# Patient Record
Sex: Male | Born: 1960 | Race: White | Hispanic: No | Marital: Married | Smoking: Never smoker
Health system: Southern US, Community
[De-identification: ages and names within clinical notes are randomized; demographics above are authoritative.]

## PROBLEM LIST (undated history)

## (undated) DIAGNOSIS — H269 Unspecified cataract: Secondary | ICD-10-CM

## (undated) DIAGNOSIS — I10 Essential (primary) hypertension: Secondary | ICD-10-CM

## (undated) DIAGNOSIS — T7840XA Allergy, unspecified, initial encounter: Secondary | ICD-10-CM

## (undated) DIAGNOSIS — E785 Hyperlipidemia, unspecified: Secondary | ICD-10-CM

## (undated) DIAGNOSIS — C801 Malignant (primary) neoplasm, unspecified: Secondary | ICD-10-CM

## (undated) DIAGNOSIS — N189 Chronic kidney disease, unspecified: Secondary | ICD-10-CM

## (undated) DIAGNOSIS — M1990 Unspecified osteoarthritis, unspecified site: Secondary | ICD-10-CM

## (undated) HISTORY — DX: Hyperlipidemia, unspecified: E78.5

## (undated) HISTORY — DX: Essential (primary) hypertension: I10

## (undated) HISTORY — DX: Unspecified cataract: H26.9

## (undated) HISTORY — PX: BRAIN SURGERY: SHX531

## (undated) HISTORY — DX: Malignant (primary) neoplasm, unspecified: C80.1

## (undated) HISTORY — DX: Chronic kidney disease, unspecified: N18.9

## (undated) HISTORY — DX: Allergy, unspecified, initial encounter: T78.40XA

## (undated) HISTORY — DX: Unspecified osteoarthritis, unspecified site: M19.90

---
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
## 2008-11-03 ENCOUNTER — Ambulatory Visit: Payer: Self-pay | Admitting: Internal Medicine

## 2010-12-07 LAB — HM COLONOSCOPY: HM Colonoscopy: NORMAL

## 2011-09-14 HISTORY — PX: MOHS SURGERY: SUR867

## 2011-12-07 ENCOUNTER — Encounter: Payer: Self-pay | Admitting: Internal Medicine

## 2011-12-07 ENCOUNTER — Ambulatory Visit (INDEPENDENT_AMBULATORY_CARE_PROVIDER_SITE_OTHER): Payer: BC Managed Care – PPO | Admitting: Internal Medicine

## 2011-12-07 VITALS — BP 115/71 | HR 64 | Temp 98.4°F | Resp 16 | Ht 69.75 in | Wt 180.0 lb

## 2011-12-07 DIAGNOSIS — E559 Vitamin D deficiency, unspecified: Secondary | ICD-10-CM

## 2011-12-07 DIAGNOSIS — I10 Essential (primary) hypertension: Secondary | ICD-10-CM

## 2011-12-07 DIAGNOSIS — E785 Hyperlipidemia, unspecified: Secondary | ICD-10-CM

## 2011-12-07 DIAGNOSIS — K4090 Unilateral inguinal hernia, without obstruction or gangrene, not specified as recurrent: Secondary | ICD-10-CM

## 2011-12-07 DIAGNOSIS — Z125 Encounter for screening for malignant neoplasm of prostate: Secondary | ICD-10-CM

## 2011-12-07 DIAGNOSIS — Z8679 Personal history of other diseases of the circulatory system: Secondary | ICD-10-CM

## 2011-12-07 LAB — COMPREHENSIVE METABOLIC PANEL
ALT: 17 U/L (ref 0–53)
Albumin: 4.5 g/dL (ref 3.5–5.2)
CO2: 27 mEq/L (ref 19–32)
Calcium: 9.7 mg/dL (ref 8.4–10.5)
Chloride: 105 mEq/L (ref 96–112)
Creatinine, Ser: 1.1 mg/dL (ref 0.4–1.5)
GFR: 78.62 mL/min (ref >60.00)
Potassium: 4.9 mEq/L (ref 3.5–5.1)
Total Protein: 7.3 g/dL (ref 6.0–8.3)

## 2011-12-07 LAB — TSH: TSH: 1.44 u[IU]/mL (ref 0.35–5.50)

## 2011-12-07 LAB — LIPID PANEL: HDL: 48.3 mg/dL (ref >39.00)
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
## 2011-12-08 LAB — VITAMIN D 25 HYDROXY (VIT D DEFICIENCY, FRACTURES): Vit D, 25-Hydroxy: 75 ng/mL (ref 30–89)

## 2011-12-10 ENCOUNTER — Encounter: Payer: Self-pay | Admitting: Internal Medicine

## 2011-12-10 DIAGNOSIS — E785 Hyperlipidemia, unspecified: Secondary | ICD-10-CM | POA: Insufficient documentation

## 2011-12-10 DIAGNOSIS — K4090 Unilateral inguinal hernia, without obstruction or gangrene, not specified as recurrent: Secondary | ICD-10-CM | POA: Insufficient documentation

## 2011-12-10 NOTE — Assessment & Plan Note (Signed)
Right side, with mild bulge, no signs of incarceration.  He is not endorsing pain so there is no urgency  To repair at this time.

## 2011-12-10 NOTE — Assessment & Plan Note (Signed)
No evidence currently.  previously addressed with weight loss

## 2011-12-10 NOTE — Assessment & Plan Note (Signed)
Return for fasting lipids.  Low glycemic index discussed.

## 2011-12-11 ENCOUNTER — Telehealth: Payer: Self-pay | Admitting: Internal Medicine

## 2011-12-11 NOTE — Telephone Encounter (Signed)
I do not know how to do that since he did not sign up for Mychart before I reviewed them.  Have you any idea?

## 2011-12-11 NOTE — Telephone Encounter (Signed)
Wanting labs posted to mychart. °

## 2011-12-12 NOTE — Telephone Encounter (Signed)
Left message notifying patient that we will mail him a copy of the labs.

## 2011-12-26 ENCOUNTER — Ambulatory Visit: Payer: Self-pay | Admitting: Internal Medicine

## 2012-08-30 ENCOUNTER — Other Ambulatory Visit: Payer: Self-pay

## 2012-10-06 ENCOUNTER — Encounter: Payer: Self-pay | Admitting: Adult Health

## 2012-10-06 ENCOUNTER — Ambulatory Visit (INDEPENDENT_AMBULATORY_CARE_PROVIDER_SITE_OTHER): Payer: BC Managed Care – PPO | Admitting: Adult Health

## 2012-10-06 VITALS — BP 156/84 | HR 74 | Temp 98.1°F | Resp 16 | Wt 176.0 lb

## 2012-10-06 DIAGNOSIS — J329 Chronic sinusitis, unspecified: Secondary | ICD-10-CM

## 2012-10-06 MED ORDER — AMOXICILLIN-POT CLAVULANATE 875-125 MG PO TABS: 1.0000 | ORAL_TABLET | Freq: Two times a day (BID) | ORAL | Status: DC

## 2012-10-06 NOTE — Patient Instructions (Addendum)
Please start the Augmentin today. You will take 1 tablet 2 times a day for 10 days.  If you see no improvement in your symptoms by day 4-5 please call the office.  Below is additional information on sinusitis:  What is sinusitis? - Sinusitis is a condition that can cause a stuffy nose, pain in the face, and yellow or green discharge (mucus) from the nose. The sinuses are hollow areas in the bones of the face. They have a thin lining that normally makes a small amount of mucus. When this lining gets infected, it swells and makes extra mucus. This causes symptoms.   Sinusitis can occur when a person gets sick with a cold. The germs causing the cold can also infect the sinuses. Many times, a person feels like his or her cold is getting better. But then he or she gets sinusitis and begins to feel sick again.  What are the symptoms of sinusitis? - Common symptoms of sinusitis include: Stuffy or blocked nose  Thick yellow or green discharge from the nose  Pain in the teeth  Pain or pressure in the face - This often feels worse when a person bends forward.   People with sinusitis can also have other symptoms that include: Fever  Cough  Trouble smelling  Ear pressure or fullness  Headache  Bad breath  Feeling tired   Most of the time, symptoms start to improve in 7 to 10 days.  Should I see a doctor or nurse? - See your doctor or nurse if your symptoms last more than 7 days, or if your symptoms get better at first but then get worse. Sometimes, sinusitis can lead to serious problems. See your doctor or nurse right away (do not wait 7 days) if you have: Fever higher than 102.29F (39.2C)  Sudden and severe pain in the face and head  Trouble seeing or seeing double  Trouble thinking clearly  Swelling or redness around 1 or both eyes  Trouble breathing or a stiff neck   Is there anything I can do on my own to feel better? - Yes. To reduce your symptoms, you can: Take an  over-the-counter pain reliever to reduce the pain  Rinse your nose and sinuses with salt water a few times a day - Ask your doctor or nurse about the best way to do this.  Use a decongestant nose spray - These sprays are sold in a pharmacy. But do not use decongestant nose sprays for more than 2 to 3 days in a row. Using them more than 3 days in a row can make symptoms worse.   You should NOT take an antihistamine for sinusitis. Common antihistamines include diphenhydramine (sample brand name: Benadryl), chlorpheniramine (sample brand name: Chlor-Trimeton), loratadine (sample brand name: Claritin), and cetirizine (sample brand name: Zyrtec). They can treat allergies, but not sinus infections, and could increase your discomfort by drying the lining of your nose and sinuses, or making you tired.   Your doctor might also prescribe a steroid nose spray to reduce the swelling in your nose. (Steroid nose sprays do not contain the same steroids that athletes take to build muscle.)  How is sinusitis treated? - Most of the time, sinusitis does not need to be treated with antibiotic medicines. This is because most sinusitis is caused by viruses - not bacteria - and antibiotics do not kill viruses. Many people get over sinus infections without antibiotics.  Some people with sinusitis do need treatment with antibiotics.  If your symptoms have not improved after 7 to 10 days, ask your doctor if you should take antibiotics. Your doctor might recommend that you wait 1 more week to see if your symptoms improve. But if you have symptoms such as a fever or a lot of pain, he or she might prescribe antibiotics. It is important to follow your doctor's instructions about taking your antibiotics.

## 2012-10-06 NOTE — Progress Notes (Signed)
  Subjective:    Patient ID: Daniel Lawrence, male    DOB: August 22, 1960, 52 y.o.   MRN: [id number]  HPI  Patient is a pleasant 52 year old male who presents to clinic with complaints of sinus pressure, drainage that is yellow in color, cough and congestion. Symptoms have been ongoing for greater than 7 days. He has tried Zicam and he is also taking NyQuil. Patient also tried Claritin but he is not currently taking this. These helped to some degree with some of his symptoms. He is concerned that he has not seen improvement.  Current Outpatient Prescriptions on File Prior to Visit  Medication Sig Dispense Refill  . Cholecalciferol (VITAMIN D-3) 5000 UNITS TABS Take 5,000 Units by mouth daily.       No current facility-administered medications on file prior to visit.    Review of Systems  Constitutional: Negative for fever and chills.  HENT: Positive for congestion, sore throat, rhinorrhea and postnasal drip.   Eyes: Negative.   Respiratory: Positive for cough and shortness of breath. Negative for chest tightness and wheezing.   Cardiovascular: Negative.   Gastrointestinal: Negative for nausea.  Neurological: Negative for light-headedness and headaches.    BP 156/84  Pulse 74  Temp(Src) 98.1 F (36.7 C) (Oral)  Resp 16  Wt 176 lb (79.833 kg)  BMI 25.42 kg/m2  SpO2 98%    Objective:   Physical Exam  Constitutional: He is oriented to person, place, and time. He appears well-developed and well-nourished. No distress.  HENT:  Head: Normocephalic and atraumatic.  Right Ear: External ear normal.  Left Ear: External ear normal.  Pharyngeal erythema  Neck: Normal range of motion. Neck supple.  Cardiovascular: Normal rate, regular rhythm and normal heart sounds.   No murmur heard. Pulmonary/Chest: Effort normal and breath sounds normal. No respiratory distress. He has no wheezes. He has no rales.  Musculoskeletal: Normal range of motion.  Lymphadenopathy:    He has no cervical  adenopathy.  Neurological: He is alert and oriented to person, place, and time.  Skin: Skin is warm and dry.  Psychiatric: He has a normal mood and affect. His behavior is normal. Judgment and thought content normal.       Assessment & Plan:

## 2012-10-06 NOTE — Assessment & Plan Note (Signed)
Symptoms ongoing for greater than 7 days without improvement. Now has changes in the color of secretions. Start Augmentin twice a day x10 days. He may use Afrin nasal spray for severe congestion x3 days. Recommend saline nasal spray to irrigate sinuses. Return to clinic if no improvement in symptoms within 4-5 days.

## 2013-05-21 ENCOUNTER — Other Ambulatory Visit: Payer: Self-pay

## 2013-06-26 ENCOUNTER — Encounter: Payer: Self-pay | Admitting: Internal Medicine

## 2013-06-26 ENCOUNTER — Ambulatory Visit (INDEPENDENT_AMBULATORY_CARE_PROVIDER_SITE_OTHER): Payer: BC Managed Care – PPO | Admitting: Internal Medicine

## 2013-06-26 VITALS — BP 128/72 | HR 60 | Temp 98.2°F | Resp 12 | Ht 69.75 in | Wt 181.0 lb

## 2013-06-26 DIAGNOSIS — M25519 Pain in unspecified shoulder: Secondary | ICD-10-CM

## 2013-06-26 DIAGNOSIS — H01006 Unspecified blepharitis left eye, unspecified eyelid: Secondary | ICD-10-CM

## 2013-06-26 DIAGNOSIS — H01009 Unspecified blepharitis unspecified eye, unspecified eyelid: Secondary | ICD-10-CM

## 2013-06-26 DIAGNOSIS — M25511 Pain in right shoulder: Secondary | ICD-10-CM

## 2013-06-26 DIAGNOSIS — G8929 Other chronic pain: Secondary | ICD-10-CM

## 2013-06-26 MED ORDER — SULFAMETHOXAZOLE-TMP DS 800-160 MG PO TABS: 1.0000 | ORAL_TABLET | Freq: Two times a day (BID) | ORAL | Status: DC

## 2013-06-26 MED ORDER — BACITRACIN 500 UNIT/GM OP OINT: 1.0000 "application " | TOPICAL_OINTMENT | Freq: Four times a day (QID) | OPHTHALMIC | Status: DC

## 2013-06-26 NOTE — Patient Instructions (Signed)
Blepharitis Blepharitis is redness, soreness, and swelling (inflammation) of one or both eyelids. It may be caused by an allergic reaction or a bacterial infection. Blepharitis may also be associated with reddened, scaly skin (seborrhea) of the scalp and eyebrows. While you sleep, eye discharge may cause your eyelashes to stick together. Your eyelids may itch, burn, swell, and may lose their lashes. These will grow back. Your eyes may become sensitive. Blepharitis may recur and need repeated treatment. If this is the case, you may require further evaluation by an eye specialist (ophthalmologist). HOME CARE INSTRUCTIONS   Keep your hands clean.  Use a clean towel each time you dry your eyelids. Do not use this towel to clean other areas. Do not share a towel or makeup with anyone.  Wash your eyelids with warm water or warm water mixed with a small amount of baby shampoo. Do this twice a day or as often as needed.  Wash your face and eyebrows at least once a day.  Use warm compresses 2 times a day for 10 minutes at a time, or as directed by your caregiver.  Apply antibiotic ointment as directed by your caregiver.  Avoid rubbing your eyes.  Avoid wearing makeup until you get better.  Follow up with your caregiver as directed. SEEK IMMEDIATE MEDICAL CARE IF:   You have pain, redness, or swelling that gets worse or spreads to other parts of your face.  Your vision changes, or you have pain when looking at lights or moving objects.  You have a fever.  Your symptoms continue for longer than 2 to 4 days or become worse. MAKE SURE YOU:   Understand these instructions.  Will watch your condition.  Will get help right away if you are not doing well or get worse. Document Released: 06/29/2000 Document Revised: 09/24/2011 Document Reviewed: 08/09/2010 Exit[employer] Patient Information 2014 [employer].  
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
## 2013-06-26 NOTE — Progress Notes (Signed)
Pre visit review using our clinic review tool, if applicable. No additional management support is needed unless otherwise documented below in the visit note. 

## 2013-06-28 DIAGNOSIS — H01006 Unspecified blepharitis left eye, unspecified eyelid: Secondary | ICD-10-CM | POA: Insufficient documentation

## 2013-06-28 DIAGNOSIS — G8929 Other chronic pain: Secondary | ICD-10-CM | POA: Insufficient documentation

## 2013-06-28 NOTE — Assessment & Plan Note (Signed)
Brought on with exertion.  Likely a rotator cuff injury.  Referral to sports medicine.

## 2013-06-28 NOTE — Assessment & Plan Note (Addendum)
Empiric treatment of presumed staph infection with septra  And bacitracin

## 2013-07-03 ENCOUNTER — Ambulatory Visit (INDEPENDENT_AMBULATORY_CARE_PROVIDER_SITE_OTHER): Payer: BC Managed Care – PPO | Admitting: Family Medicine

## 2013-07-03 ENCOUNTER — Encounter: Payer: Self-pay | Admitting: Family Medicine

## 2013-07-03 ENCOUNTER — Telehealth: Payer: Self-pay | Admitting: Internal Medicine

## 2013-07-03 ENCOUNTER — Other Ambulatory Visit (INDEPENDENT_AMBULATORY_CARE_PROVIDER_SITE_OTHER): Payer: BC Managed Care – PPO

## 2013-07-03 VITALS — BP 134/88 | HR 86 | Wt 179.0 lb

## 2013-07-03 DIAGNOSIS — M7550 Bursitis of unspecified shoulder: Secondary | ICD-10-CM | POA: Insufficient documentation

## 2013-07-03 DIAGNOSIS — M25519 Pain in unspecified shoulder: Secondary | ICD-10-CM

## 2013-07-03 DIAGNOSIS — M75100 Unspecified rotator cuff tear or rupture of unspecified shoulder, not specified as traumatic: Secondary | ICD-10-CM

## 2013-07-03 DIAGNOSIS — M7551 Bursitis of right shoulder: Secondary | ICD-10-CM

## 2013-07-03 DIAGNOSIS — M25511 Pain in right shoulder: Secondary | ICD-10-CM

## 2013-07-03 NOTE — Progress Notes (Signed)
Pre-visit discussion using our clinic review tool. No additional management support is needed unless otherwise documented below in the visit note.  

## 2013-07-03 NOTE — Assessment & Plan Note (Signed)
Injection as described above. Patient did have a very small tear of the supraspinatus less than 5% of the tendon is involved with no retraction. Home exercise program was given Discussed anti-inflammatories, see meds per orders Patient will try these interventions and come back and see me again in 4 weeks. I am optimistic he'll do very well with conservative therapy.

## 2013-07-03 NOTE — Patient Instructions (Signed)
Great to meet you Try exercises most days of the week Ice 20 minutes 2 times a day for 48 hours then after activity.  Vitamin D 5000 IU daily Fish oil 3 grams daily can help Tumeric 500mg  twice daily is like ibuprofen.  If still in pain or not perfect come back in 4 weeks.

## 2013-07-03 NOTE — Progress Notes (Signed)
I'm seeing this patient by the request  of:  TULLO,TERESA, MD   CC: Shoulder pain right-sided  HPI: Patient is a 52 year old right-hand-dominant male coming in with right shoulder pain. Patient states that he does narrative or any true injury. Patient states seems to be worse with activity. 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 Patient denies any numbness. Patient states it hurts more when he does overhead. States more of a dull nagging pain. Denies any weakness. Patient rates the discomfort 4/10. Patient has not tried anything at home for it at this time. 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   Past medical, surgical, family and social history reviewed. Medications reviewed all in the electronic medical record.   Review of Systems: No headache, visual changes, nausea, vomiting, diarrhea, constipation, dizziness, abdominal pain, skin rash, fevers, chills, night sweats, weight loss, swollen lymph nodes, body aches, joint swelling, muscle aches, chest pain, shortness of breath, mood changes.   Objective:    Blood pressure 134/88, pulse 86, weight 179 lb (81.194 kg), SpO2 98.00%.   General: No apparent distress alert and oriented x3 mood and affect normal, dressed appropriately.  HEENT: Pupils equal, extraocular movements intact Respiratory: Patient's speak in full sentences and does not appear short of breath Cardiovascular: No lower extremity edema, non tender, no erythema Skin: Warm dry intact with no signs of infection or rash on extremities or on axial skeleton. Abdomen: Soft nontender Neuro: Cranial nerves II through XII are intact, neurovascularly intact in all extremities with 2+ DTRs and 2+ pulses. Lymph: No lymphadenopathy of posterior or anterior cervical chain or axillae bilaterally.  Gait normal with good balance and coordination.  MSK: Non tender with full range of motion and good stability  and symmetric strength and tone of elbows, wrist, hip, knee and ankles bilaterally. Patient's joints to have increased flexibility with beighton score of 6-9 Shoulder: Right Inspection reveals no abnormalities, atrophy or asymmetry. Palpation is normal with no tenderness over AC joint or bicipital groove. ROM is full in all planes. Rotator cuff strength normal throughout. Mild signs of impingement with positive Neer and Hawkin's tests, but negative empty can sign. Speeds and Yergason's tests normal. No labral pathology noted with negative Obrien's, negative clunk and good stability. Normal scapular function observed. No painful arc and no drop arm sign. No apprehension sign  MSK US performed of: Right shoulder This study was ordered, performed, and interpreted by Terrilee Files D.O.  Shoulder:   Supraspinatus:  A patient has a significant bursitis occurring at the insertion of the supraspinatus. There is a very small less than 5% tear of the supraspinatus intrasubstance. Infraspinatus:  Appears normal on long and transverse views. Significant hypoechoic changes in the area. Subscapularis:  Appears normal on long and transverse views. Teres Minor:  Appears normal on long and transverse views. AC joint:  Capsule distended, Glenohumeral Joint:  Appears normal with mild effusion. Glenoid Labrum:  Posterior labrum does have what appears to be a degenerative tear Biceps Tendon:  Appears normal on long and transverse views, no fraying of tendon, tendon located in intertubercular groove, no subluxation with shoulder internal or external rotation. Mild increase in hypoechoic changes. No increased power doppler signal.  Impression: Subacromial bursitis with very small intersubstance tear of the supraspinatus. Also posterior labral tear degenerative in nature.  Procedure: Real-time Ultrasound Guided Injection of right glenohumeral joint Device: GE Logiq E  Ultrasound guided injection is preferred  based studies that show increased duration, increased effect, greater accuracy, decreased procedural pain, increased response rate with ultrasound guided versus blind injection.  Verbal informed consent obtained.  Time-out conducted.  Noted no overlying erythema, induration, or other signs of local infection.  Skin prepped in a sterile fashion.  Local anesthesia: Topical Ethyl chloride.  With sterile technique and under real time ultrasound guidance:  Joint visualized.  23g 1  inch needle inserted posterior approach. Pictures taken for needle placement. Patient did have injection of 2 cc of 1% lidocaine, 2 cc of 0.5% Marcaine, and 1.0 cc of Kenalog 40 mg/dL. Completed without difficulty  Pain immediately resolved suggesting accurate placement of the medication.  Advised to call if fevers/chills, erythema, induration, drainage, or persistent bleeding.  Images permanently stored and available for review in the ultrasound unit.  Impression: Technically successful ultrasound guided injection.     Impression and Recommendations:     This case required medical decision making of moderate complexity.

## 2013-07-03 NOTE — Telephone Encounter (Signed)
Patient Information:  Caller Name: Jordin  Phone: [phone]  Patient: Daniel Lawrence, Daniel Lawrence  Gender: Male  DOB: 03-02-1961  Age: 52 Years  PCP: Duncan Dull (Adults only)  Office Follow Up:  Does the office need to follow up with this patient?: No  Instructions For The Office: N/A  RN Note:  Kadence developed a rash on his chest and legs this afternoon.  States it is slightly raised, pinik bumps.  Mild itching. Took last dose of sulfa abx last night.  Had Corticosteroid shot in right shoulder this morning.  Office is closed.  Advised UC.  Symptoms  Reason For Call & Symptoms: Rash  Reviewed Health History In EMR: Yes  Reviewed Medications In EMR: Yes  Reviewed Allergies In EMR: Yes  Reviewed Surgeries / Procedures: Yes  Date of Onset of Symptoms: 07/03/2013  Guideline(s) Used:  Rash - Widespread on Drugs - Drug Reaction  Disposition Per Guideline:   See Today in Office  Reason For Disposition Reached:   All other patients with widespread rash taking a new medication  Advice Given:  Call Back If:  You become worse.  Patient Will Follow Care Advice:  YES

## 2013-07-04 ENCOUNTER — Ambulatory Visit: Payer: Self-pay | Admitting: Physician Assistant

## 2013-07-06 NOTE — Telephone Encounter (Signed)
Pt advised to go to [facility], after office hours

## 2013-07-12 ENCOUNTER — Encounter: Payer: Self-pay | Admitting: Internal Medicine

## 2014-05-15 LAB — BASIC METABOLIC PANEL
BUN: 10 mg/dL (ref 4–21)
Creatinine: 1.1 mg/dL (ref 0.6–1.3)
GLUCOSE: 83 mg/dL
Potassium: 5 mmol/L (ref 3.4–5.3)
Sodium: 140 mmol/L (ref 137–147)

## 2014-05-15 LAB — CBC AND DIFFERENTIAL
HCT: 49 % (ref 41–53)
Hemoglobin: 16.8 g/dL (ref 13.5–17.5)
PLATELETS: 215 10*3/uL (ref 150–399)
WBC: 4.2 10*3/mL

## 2014-05-15 LAB — HEPATIC FUNCTION PANEL
ALK PHOS: 66 U/L (ref 25–125)
ALT: 23 U/L (ref 10–40)
AST: 23 U/L (ref 14–40)
BILIRUBIN DIRECT: 0.18 mg/dL (ref 0.01–0.4)
Bilirubin, Total: 0.8 mg/dL

## 2014-05-15 LAB — LIPID PANEL
Cholesterol: 217 mg/dL — AB (ref 0–200)
HDL: 54 mg/dL (ref 35–70)
LDL Cholesterol: 132 mg/dL
Triglycerides: 155 mg/dL (ref 40–160)

## 2014-06-03 ENCOUNTER — Encounter: Payer: Self-pay | Admitting: Internal Medicine

## 2014-06-23 ENCOUNTER — Encounter: Payer: Self-pay | Admitting: Internal Medicine

## 2014-06-23 ENCOUNTER — Ambulatory Visit (INDEPENDENT_AMBULATORY_CARE_PROVIDER_SITE_OTHER): Payer: BC Managed Care – PPO | Admitting: Internal Medicine

## 2014-06-23 VITALS — BP 158/88 | HR 65 | Temp 97.8°F | Resp 16 | Ht 69.75 in | Wt 189.0 lb

## 2014-06-23 DIAGNOSIS — R238 Other skin changes: Secondary | ICD-10-CM

## 2014-06-23 DIAGNOSIS — R233 Spontaneous ecchymoses: Secondary | ICD-10-CM

## 2014-06-23 DIAGNOSIS — Z85828 Personal history of other malignant neoplasm of skin: Secondary | ICD-10-CM

## 2014-06-23 DIAGNOSIS — Z0000 Encounter for general adult medical examination without abnormal findings: Secondary | ICD-10-CM

## 2014-06-23 DIAGNOSIS — Z8679 Personal history of other diseases of the circulatory system: Secondary | ICD-10-CM

## 2014-06-23 DIAGNOSIS — Z9889 Other specified postprocedural states: Secondary | ICD-10-CM | POA: Insufficient documentation

## 2014-06-23 DIAGNOSIS — R030 Elevated blood-pressure reading, without diagnosis of hypertension: Secondary | ICD-10-CM

## 2014-06-23 DIAGNOSIS — I10 Essential (primary) hypertension: Secondary | ICD-10-CM

## 2014-06-23 DIAGNOSIS — Z23 Encounter for immunization: Secondary | ICD-10-CM

## 2014-06-23 DIAGNOSIS — E785 Hyperlipidemia, unspecified: Secondary | ICD-10-CM

## 2014-06-23 LAB — MICROALBUMIN / CREATININE URINE RATIO
CREATININE, U: 24 mg/dL
MICROALB UR: 15.4 mg/dL — AB (ref 0.0–1.9)
Microalb Creat Ratio: 64.1 mg/g — ABNORMAL HIGH (ref 0.0–30.0)

## 2014-06-23 LAB — PROTIME-INR
INR: 1 ratio (ref 0.8–1.0)
Prothrombin Time: 11.3 s (ref 9.6–13.1)

## 2014-06-23 LAB — APTT: aPTT: 29.1 s (ref 23.4–32.7)

## 2014-06-23 NOTE — Assessment & Plan Note (Signed)
Home readings advised.  Checking BMEt and urine today

## 2014-06-23 NOTE — Progress Notes (Signed)
Patient ID: Daniel Lawrence, male   DOB: 1961/04/20, 53 y.o.   MRN: [id number]   The patient is here for his annual male physical examination and management of other chronic and acute problems.  Mulitple concerns:   Recurrent bruising occurring  Occurs after yoga classes applying pressure on arm .  Lasts about two weeks. Painless.  Takes no aspirin  products.  Gums do not bleed   No nosebleeds.  Hyperlipidemia.  Concerned about LDL of 132  Wants to see a nutritionist  Shoulder pain resolved after steroid injection  Worried about a lump in the back of his neck found by massage therapist .  No history of scalp infections but lots of seasonal allergies and sinus pressure .  Using claritin   Blepharitis recurrent.  Went to [facility] and saw Philis Kendall who was supposed to refer him to another ophthalmologist but did not.  Was prescribed a steroid ointment which caused blurred vision . Would like the referral to take place.  Sees [facility] annually,  No cancerous lesions this year. Dentist semiannually       The risk factors are reflected in the social history.  The roster of all physicians providing medical care to patient - is listed in the Snapshot section of the chart.  Activities of daily living:  The patient is 100% independent in all ADLs: dressing, toileting, feeding as well as independent mobility  Home safety : The patient has smoke detectors in the home. He wears seatbelts.  There are no firearms at home. There is no violence in the home.   There is no risks for hepatitis, STDs or HIV. There is no   history of blood transfusion. There is no travel history to infectious disease endemic areas of the world.  The patient has seen their dentist in the last six month and  their eye doctor in the last year.  They do not  have excessive sun exposure. They have seen a dermatoloigist in the last year. Discussed the need for sun protection: hats, long sleeves and use of  sunscreen if there is significant sun exposure.   Diet: the importance of a healthy diet is discussed. They do have a healthy diet.  The benefits of regular aerobic exercise were discussed. He exercises a minimum of 30 minutes  5 days per week.  Depression screen: there are no signs or vegative symptoms of depression- irritability, change in appetite, anhedonia, sadness/tearfullness.  The following portions of the patient's history were reviewed and updated as appropriate: allergies, current medications, past family history, past medical history,  past surgical history, past social history  and problem list.  Visual acuity was not assessed per patient preference since he has regular follow up with his ophthalmologist. Hearing and body mass index were assessed and reviewed.   During the course of the visit the patient was educated and counseled about appropriate screening and preventive services including :  nutrition counseling, colorectal cancer screening, and recommended immunizations.      Objective:  BP 158/88 mmHg  Pulse 65  Temp(Src) 97.8 F (36.6 C) (Oral)  Resp 16  Ht 5' 9.75" (1.772 m)  Wt 189 lb (85.73 kg)  BMI 27.30 kg/m2  SpO2 98%  General Appearance:    Alert, cooperative, no distress, appears stated age  Head:    Normocephalic, without obvious abnormality, atraumatic  Eyes:    PERRL, conjunctiva/corneas clear, EOM's intact, fundi    benign, both eyes  Ears:    Normal TM's and external ear canals, both ears  Nose:   Nares normal, septum midline, mucosa normal, no drainage   or sinus tenderness  Throat:   Lips, mucosa, and tongue normal; teeth and gums normal  Neck:   Supple, symmetrical, trachea midline, no adenopathy;       thyroid:  No enlargement/tenderness/nodules; no carotid   bruit or JVD  Back:     Symmetric, no curvature, ROM normal, no CVA tenderness  Lungs:     Clear to auscultation bilaterally, respirations unlabored  Chest wall:    No tenderness  or deformity  Heart:    Regular rate and rhythm, S1 and S2 normal, no murmur, rub   or gallop  Abdomen:     Soft, non-tender, bowel sounds active all four quadrants,    no masses, no organomegaly  Genitalia:    Normal male without lesion, discharge or tenderness  Rectal:    Normal tone, normal prostate, no masses or tenderness;     Extremities:   Extremities normal, atraumatic, no cyanosis or edema  Pulses:   2+ and symmetric all extremities  Skin:   Skin color, texture, turgor normal, no rashes or lesions  Lymph nodes:   Cervical, supraclavicular, and axillary nodes normal  Neurologic:   CNII-XII intact. Normal strength, sensation and reflexes      throughout   Assessment and Plan:  Problem List Items Addressed This Visit      Cardiovascular and Mediastinum   Hypertension - Primary    With mild proteinuria.  Starting lisinopril.  Patient ad ised to return in one week for repeat BMET    Relevant Medications      lisinopril (PRINIVIL,ZESTRIL) tablet     Other   Hyperlipidemia with target LDL less than 100    Mild, patient requesting referral to nutritionist to manage without medications     Relevant Medications      lisinopril (PRINIVIL,ZESTRIL) tablet   Easy bruising    PT and PTT and platelets are normal.  Reassurance given.     Relevant Orders      INR/PT (Completed)      PTT (Completed)   Encounter for preventive health examination    Annual male exam was done including testicular exam and check for hernias.  Screening for STDS and depressive symptoms was negative. Vaccines were brought up to date.  Healthy habits were reviewed including use of seatbelts 100% of the time , moderate alcohol consumption, regular exercise, and the  mediterranean diet.     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  Checking BMEt and urine today      Other Visit Diagnoses    Hyperlipidemia LDL goal <100         Relevant Medications       lisinopril (PRINIVIL,ZESTRIL) tablet    Other Relevant Orders       Ambulatory referral to Nutrition and Diabetic Education    Need for prophylactic vaccination with combined diphtheria-tetanus-pertussis (DTP) vaccine        Relevant Orders       Tdap vaccine greater than or equal to 7yo IM (Completed)

## 2014-06-23 NOTE — Patient Instructions (Addendum)
Debrox for your right ear wax buildup  You do not need statin  Therapy for your mildly elevated cholesterol,  But I will refer you to a  Nutritionist at [facility] received the TDaP vaccine today  Please check BP at home daily for the next 2 weeks and e mail me your readings   Health Maintenance A healthy lifestyle and preventative care can promote health and wellness.  Maintain regular health, dental, and eye exams.  Eat a healthy diet. Foods like vegetables, fruits, whole grains, low-fat dairy products, and lean protein foods contain the nutrients you need and are low in calories. Decrease your intake of foods high in solid fats, added sugars, and salt. Get information about a proper diet from your health care provider, if necessary.  Regular physical exercise is one of the most important things you can do for your health. Most adults should get at least 150 minutes of moderate-intensity exercise (any activity that increases your heart rate and causes you to sweat) each week. In addition, most adults need muscle-strengthening exercises on 2 or more days a week.   Maintain a healthy weight. The body mass index (BMI) is a screening tool to identify possible weight problems. It provides an estimate of body fat based on height and weight. Your health care provider can find your BMI and can help you achieve or maintain a healthy weight. For males 20 years and older:  A BMI below 18.5 is considered underweight.  A BMI of 18.5 to 24.9 is normal.  A BMI of 25 to 29.9 is considered overweight.  A BMI of 30 and above is considered obese.  Maintain normal blood lipids and cholesterol by exercising and minimizing your intake of saturated fat. Eat a balanced diet with plenty of fruits and vegetables. Blood tests for lipids and cholesterol should begin at age 41 and be repeated every 5 years. If your lipid or cholesterol levels are high, you are over age 41, or you are at high risk for heart disease,  you may need your cholesterol levels checked more frequently.Ongoing high lipid and cholesterol levels should be treated with medicines if diet and exercise are not working.  If you smoke, find out from your health care provider how to quit. If you do not use tobacco, do not start.  Lung cancer screening is recommended for adults aged 88-80 years who are at high risk for developing lung cancer because of a history of smoking. A yearly low-dose CT scan of the lungs is recommended for people who have at least a 30-pack-year history of smoking and are current smokers or have quit within the past 15 years. A pack year of smoking is smoking an average of 1 pack of cigarettes a day for 1 year (for example, a 30-pack-year history of smoking could mean smoking 1 pack a day for 30 years or 2 packs a day for 15 years). Yearly screening should continue until the smoker has stopped smoking for at least 15 years. Yearly screening should be stopped for people who develop a health problem that would prevent them from having lung cancer treatment.  If you choose to drink alcohol, do not have more than 2 drinks per day. One drink is considered to be 12 oz (360 mL) of beer, 5 oz (150 mL) of wine, or 1.5 oz (45 mL) of liquor.  Avoid the use of street drugs. Do not share needles with anyone. Ask for help if you need support or instructions  about stopping the use of drugs.  High blood pressure causes heart disease and increases the risk of stroke. Blood pressure should be checked at least every 1-2 years. Ongoing high blood pressure should be treated with medicines if weight loss and exercise are not effective.  If you are 34-78 years old, ask your health care provider if you should take aspirin to prevent heart disease.  Diabetes screening involves taking a blood sample to check your fasting blood sugar level. This should be done once every 3 years after age 39 if you are at a normal weight and without risk factors for  diabetes. Testing should be considered at a younger age or be carried out more frequently if you are overweight and have at least 1 risk factor for diabetes.  Colorectal cancer can be detected and often prevented. Most routine colorectal cancer screening begins at the age of 72 and continues through age 62. However, your health care provider may recommend screening at an earlier age if you have risk factors for colon cancer. On a yearly basis, your health care provider may provide home test kits to check for hidden blood in the stool. A small camera at the end of a tube may be used to directly examine the colon (sigmoidoscopy or colonoscopy) to detect the earliest forms of colorectal cancer. Talk to your health care provider about this at age 56 when routine screening begins. A direct exam of the colon should be repeated every 5-10 years through age 66, unless early forms of precancerous polyps or small growths are found.  People who are at an increased risk for hepatitis B should be screened for this virus. You are considered at high risk for hepatitis B if:  You were born in a country where hepatitis B occurs often. Talk with your health care provider about which countries are considered high risk.  Your parents were born in a high-risk country and you have not received a shot to protect against hepatitis B (hepatitis B vaccine).  You have HIV or AIDS.  You use needles to inject street drugs.  You live with, or have sex with, someone who has hepatitis B.  You are a man who has sex with other men (MSM).  You get hemodialysis treatment.  You take certain medicines for conditions like cancer, organ transplantation, and autoimmune conditions.  Hepatitis C blood testing is recommended for all people born from [birth year] through 1965 and any individual with known risk factors for hepatitis C.  Healthy men should no longer receive prostate-specific antigen (PSA) blood tests as part of routine cancer  screening. Talk to your health care provider about prostate cancer screening.  Testicular cancer screening is not recommended for adolescents or adult males who have no symptoms. Screening includes self-exam, a health care provider exam, and other screening tests. Consult with your health care provider about any symptoms you have or any concerns you have about testicular cancer.  Practice safe sex. Use condoms and avoid high-risk sexual practices to reduce the spread of sexually transmitted infections (STIs).  You should be screened for STIs, including gonorrhea and chlamydia if:  You are sexually active and are younger than 24 years.  You are older than 24 years, and your health care provider tells you that you are at risk for this type of infection.  Your sexual activity has changed since you were last screened, and you are at an increased risk for chlamydia or gonorrhea. Ask your health care provider if you  are at risk.  If you are at risk of being infected with HIV, it is recommended that you take a prescription medicine daily to prevent HIV infection. This is called pre-exposure prophylaxis (PrEP). You are considered at risk if:  You are a man who has sex with other men (MSM).  You are a heterosexual man who is sexually active with multiple partners.  You take drugs by injection.  You are sexually active with a partner who has HIV.  Talk with your health care provider about whether you are at high risk of being infected with HIV. If you choose to begin PrEP, you should first be tested for HIV. You should then be tested every 3 months for as long as you are taking PrEP.  Use sunscreen. Apply sunscreen liberally and repeatedly throughout the day. You should seek shade when your shadow is shorter than you. Protect yourself by wearing long sleeves, pants, a wide-brimmed hat, and sunglasses year round whenever you are outdoors.  Tell your health care provider of new moles or changes in  moles, especially if there is a change in shape or color. Also, tell your health care provider if a mole is larger than the size of a pencil eraser.  A one-time screening for abdominal aortic aneurysm (AAA) and surgical repair of large AAAs by ultrasound is recommended for men aged 4-75 years who are current or former smokers.  Stay current with your vaccines (immunizations). Document Released: 12/29/2007 Document Revised: 07/07/2013 Document Reviewed: 11/27/2010 [facility] Patient Information 2015 [place], [place]. This information is not intended to replace advice given to you by your health care provider. Make sure you discuss any questions you have with your health care provider.

## 2014-06-23 NOTE — Progress Notes (Signed)
Pre-visit discussion using our clinic review tool. No additional management support is needed unless otherwise documented below in the visit note.  

## 2014-06-24 ENCOUNTER — Encounter: Payer: Self-pay | Admitting: Internal Medicine

## 2014-06-24 DIAGNOSIS — Z0000 Encounter for general adult medical examination without abnormal findings: Secondary | ICD-10-CM | POA: Insufficient documentation

## 2014-06-24 MED ORDER — LISINOPRIL 10 MG PO TABS: 10.0000 mg | ORAL_TABLET | Freq: Every day | ORAL | Status: DC

## 2014-06-24 NOTE — Assessment & Plan Note (Signed)
Mild, patient requesting referral to nutritionist to manage without medications

## 2014-06-24 NOTE — Assessment & Plan Note (Signed)
With mild proteinuria.  Starting lisinopril.  Patient ad ised to return in one week for repeat BMET

## 2014-06-24 NOTE — Assessment & Plan Note (Signed)
Annual male exam was done including testicular exam and check for hernias.  Screening for STDS and depressive symptoms was negative. Vaccines were brought up to date.  Healthy habits were reviewed including use of seatbelts 100% of the time , moderate alcohol consumption, regular exercise, and the  mediterranean diet.

## 2014-06-24 NOTE — Assessment & Plan Note (Signed)
PT and PTT and platelets are normal.  Reassurance given.

## 2014-07-22 ENCOUNTER — Ambulatory Visit: Payer: Self-pay | Admitting: Internal Medicine

## 2014-08-16 ENCOUNTER — Ambulatory Visit: Payer: Self-pay | Admitting: Internal Medicine

## 2014-11-24 ENCOUNTER — Ambulatory Visit (INDEPENDENT_AMBULATORY_CARE_PROVIDER_SITE_OTHER): Payer: BLUE CROSS/BLUE SHIELD | Admitting: Internal Medicine

## 2014-11-24 ENCOUNTER — Encounter: Payer: Self-pay | Admitting: Internal Medicine

## 2014-11-24 VITALS — BP 126/72 | HR 73 | Temp 98.3°F | Resp 14 | Ht 70.0 in | Wt 189.2 lb

## 2014-11-24 DIAGNOSIS — H01006 Unspecified blepharitis left eye, unspecified eyelid: Secondary | ICD-10-CM | POA: Diagnosis not present

## 2014-11-24 DIAGNOSIS — C44112 Basal cell carcinoma of skin of right eyelid, including canthus: Secondary | ICD-10-CM

## 2014-11-24 DIAGNOSIS — C44111 Basal cell carcinoma of skin of unspecified eyelid, including canthus: Secondary | ICD-10-CM

## 2014-11-24 MED ORDER — NEOMYCIN-POLYMYXIN-DEXAMETH 3.5-10000-0.1 OP SUSP: 1.0000 [drp] | Freq: Four times a day (QID) | OPHTHALMIC | Status: DC

## 2014-11-24 MED ORDER — AZITHROMYCIN 250 MG PO TABS: ORAL_TABLET | ORAL | Status: DC

## 2014-11-24 NOTE — Progress Notes (Signed)
Pre-visit discussion using our clinic review tool. No additional management support is needed unless otherwise documented below in the visit note.  

## 2014-11-24 NOTE — Patient Instructions (Signed)
Treatment for blepharitis  Maxitrol nightly  Azithromycin 500 mg day 1,  250 mg daily thereafter   5 days total  Please take a probiotic ( Align, Floraque,  Flora jen  or [place]) while you are on the antibiotic to prevent a serious antibiotic associated diarrhea  Called clostridium dificile colitis   systane or refresh eye drops every 2 hours   Warm compress do help liquify the secretions that are plugging the meoiboian glands,  So continue doing them

## 2014-11-27 ENCOUNTER — Encounter: Payer: Self-pay | Admitting: Internal Medicine
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
## 2014-11-27 NOTE — Assessment & Plan Note (Signed)
Maxitrol topical,  Azithromycin 500 mg daily x 7 days

## 2014-12-09 ENCOUNTER — Telehealth: Payer: Self-pay | Admitting: Internal Medicine

## 2014-12-23 ENCOUNTER — Other Ambulatory Visit: Payer: Self-pay | Admitting: Internal Medicine

## 2014-12-23 NOTE — Telephone Encounter (Signed)
Last OV 5.11.16, last refill 5.11.16.  Please advise refill in Dr Demetrios Isaacs absence.

## 2014-12-24 ENCOUNTER — Ambulatory Visit (INDEPENDENT_AMBULATORY_CARE_PROVIDER_SITE_OTHER): Payer: BLUE CROSS/BLUE SHIELD | Admitting: Nurse Practitioner

## 2014-12-24 VITALS — BP 124/60 | HR 73 | Temp 97.9°F | Resp 18 | Ht 70.0 in | Wt 187.0 lb

## 2014-12-24 DIAGNOSIS — H01006 Unspecified blepharitis left eye, unspecified eyelid: Secondary | ICD-10-CM

## 2014-12-24 MED ORDER — NEOMYCIN-POLYMYXIN-DEXAMETH 3.5-10000-0.1 OP SUSP: 1.0000 [drp] | Freq: Four times a day (QID) | OPHTHALMIC | Status: DC

## 2014-12-24 NOTE — Progress Notes (Signed)
Pre visit review using our clinic review tool, if applicable. No additional management support is needed unless otherwise documented below in the visit note. 

## 2014-12-24 NOTE — Progress Notes (Signed)
   Subjective:    Patient ID: Daniel Lawrence, male    DOB: [birth year], 54 y.o.   MRN: [id number]  HPI  Daniel Lawrence is a 54 yo male with a CC of medication refill.   1) Maxitrol suspension  Left eye chronic styes  Blurry vision, not using  Cool and warm compresses   1st week in July ophthalmology    Review of Systems  Constitutional: Negative for fever, chills, diaphoresis and fatigue.  Eyes: Positive for pain. Negative for visual disturbance.       Left eye acute on chronic stye  Respiratory: Negative for chest tightness, shortness of breath and wheezing.   Cardiovascular: Negative for chest pain, palpitations and leg swelling.  Gastrointestinal: Negative for nausea, vomiting and diarrhea.  Skin: Negative for rash.  Neurological: Negative for dizziness, weakness and numbness.  Psychiatric/Behavioral: The patient is not nervous/anxious.        Objective:   Physical Exam  Constitutional: He is oriented to person, place, and time. He appears well-developed and well-nourished. No distress.  BP 124/60 mmHg  Pulse 73  Temp(Src) 97.9 F (36.6 C)  Resp 18  Ht 5\' 10"  (1.778 m)  Wt 187 lb (84.823 kg)  BMI 26.83 kg/m2  SpO2 97%   HENT:  Head: Normocephalic and atraumatic.  Right Ear: External ear normal.  Left Ear: External ear normal.  Eyes: EOM are normal. Pupils are equal, round, and reactive to light. Right eye exhibits no discharge. Left eye exhibits no discharge. No scleral icterus.  Stye of left upper eyelid. No sign of drainage or purulence.   Cardiovascular: Normal rate, regular rhythm, normal heart sounds and intact distal pulses.  Exam reveals no gallop and no friction rub.   No murmur heard. Pulmonary/Chest: Effort normal and breath sounds normal. No respiratory distress. He has no wheezes. He has no rales. He exhibits no tenderness.  Neurological: He is alert and oriented to person, place, and time.  Skin: Skin is warm and dry. No rash noted. He is not  diaphoretic.  Psychiatric: He has a normal mood and affect. His behavior is normal. Judgment and thought content normal.      Assessment & Plan:

## 2015-01-01 ENCOUNTER — Encounter: Payer: Self-pay | Admitting: Nurse Practitioner

## 2015-01-01 NOTE — Assessment & Plan Note (Signed)
Filled Maxitrol script for pt. Will see ophthalmology shortly.

## 2015-04-25 ENCOUNTER — Other Ambulatory Visit: Payer: Self-pay | Admitting: Internal Medicine

## 2015-05-30 LAB — PSA: PSA: 1

## 2015-06-03 LAB — LIPID PANEL
CHOLESTEROL: 218 mg/dL — AB (ref 0–200)
HDL: 48 mg/dL (ref 35–70)
LDL CALC: 156 mg/dL
Triglycerides: 145 mg/dL (ref 40–160)

## 2015-06-03 LAB — CBC AND DIFFERENTIAL
HEMATOCRIT: 48 % (ref 41–53)
HEMOGLOBIN: 16.4 g/dL (ref 13.5–17.5)
PLATELETS: 188 10*3/uL (ref 150–399)
WBC: 4.1 10*3/mL

## 2015-06-03 LAB — BASIC METABOLIC PANEL
BUN: 10 mg/dL (ref 4–21)
Creatinine: 1.2 mg/dL (ref 0.6–1.3)
Glucose: 87 mg/dL
POTASSIUM: 4.4 mmol/L (ref 3.4–5.3)
Sodium: 142 mmol/L (ref 137–147)

## 2015-06-03 LAB — HEPATIC FUNCTION PANEL
ALK PHOS: 67 U/L (ref 25–125)
ALT: 17 U/L (ref 10–40)
AST: 17 U/L (ref 14–40)
Bilirubin, Direct: 0.19 mg/dL (ref 0.01–0.4)
Bilirubin, Total: 0.8 mg/dL

## 2015-06-06 ENCOUNTER — Encounter: Payer: Self-pay | Admitting: Internal Medicine

## 2015-06-07 ENCOUNTER — Encounter: Payer: Self-pay | Admitting: *Deleted

## 2015-06-07 ENCOUNTER — Telehealth: Payer: Self-pay | Admitting: Internal Medicine

## 2015-06-07 NOTE — Telephone Encounter (Signed)
Your PSA, Vitamin  D, thyroid, cholesterol, CBC ,  liver and kidney function are normal.   Please continue your current medications and plan to repeat the nonfasting metabolic panel in 6 months  To continue receiving refills on lisinopril.    Regards,   Dr. Derrel Nip

## 2015-06-07 NOTE — Telephone Encounter (Signed)
Letter mailed with normal labs. 

## 2015-10-19 ENCOUNTER — Other Ambulatory Visit: Payer: Self-pay | Admitting: Internal Medicine

## 2016-01-22 ENCOUNTER — Other Ambulatory Visit: Payer: Self-pay | Admitting: Internal Medicine

## 2016-01-23 NOTE — Telephone Encounter (Signed)
NO OV since 11/24/14 ?

## 2016-01-24 NOTE — Telephone Encounter (Signed)
Refill for 30 days only.  Has not been seen in one year so patient needs a  30 minute visit AND fasting lab appt prior to visit : CMET, and fasting lipids

## 2016-03-15 ENCOUNTER — Telehealth: Payer: Self-pay | Admitting: *Deleted

## 2016-03-15 DIAGNOSIS — E559 Vitamin D deficiency, unspecified: Secondary | ICD-10-CM

## 2016-03-15 DIAGNOSIS — I10 Essential (primary) hypertension: Secondary | ICD-10-CM

## 2016-03-15 DIAGNOSIS — E785 Hyperlipidemia, unspecified: Secondary | ICD-10-CM

## 2016-03-15 NOTE — Telephone Encounter (Signed)
Please advise, last labs were in 05/2015

## 2016-03-15 NOTE — Telephone Encounter (Signed)
Can schedule in Sept, only lab that can't be drawn is PSA as it is too early, thanks

## 2016-03-15 NOTE — Telephone Encounter (Signed)
Fasting Labs have been ordered for here .  Too early for annual PSA

## 2016-03-15 NOTE — Telephone Encounter (Signed)
Patient requested labs to be ordered before his physical appt on 10/13, however he wanted them drawn in sept

## 2016-03-16 ENCOUNTER — Ambulatory Visit (INDEPENDENT_AMBULATORY_CARE_PROVIDER_SITE_OTHER): Payer: BLUE CROSS/BLUE SHIELD | Admitting: Family Medicine

## 2016-03-16 ENCOUNTER — Encounter: Payer: Self-pay | Admitting: Family Medicine

## 2016-03-16 VITALS — BP 138/82 | HR 72 | Temp 98.2°F | Resp 16 | Wt 180.0 lb

## 2016-03-16 DIAGNOSIS — R310 Gross hematuria: Secondary | ICD-10-CM

## 2016-03-16 DIAGNOSIS — R319 Hematuria, unspecified: Secondary | ICD-10-CM | POA: Diagnosis not present

## 2016-03-16 LAB — POCT URINALYSIS DIPSTICK
BILIRUBIN UA: NEGATIVE
Glucose, UA: NEGATIVE
LEUKOCYTES UA: NEGATIVE
Nitrite, UA: NEGATIVE
PROTEIN UA: 100
SPEC GRAV UA: 1.02
Urobilinogen, UA: 0.2
pH, UA: 6.5

## 2016-03-16 MED ORDER — LISINOPRIL 10 MG PO TABS: ORAL_TABLET | ORAL | 0 refills | Status: DC

## 2016-03-17 DIAGNOSIS — R310 Gross hematuria: Secondary | ICD-10-CM | POA: Insufficient documentation

## 2016-03-17 LAB — URINALYSIS, MICROSCOPIC ONLY
BACTERIA UA: NONE SEEN [HPF]
CASTS: NONE SEEN [LPF]
CRYSTALS: NONE SEEN [HPF]
Squamous Epithelial / LPF: NONE SEEN [HPF] (ref <=5)
WBC UA: NONE SEEN WBC/HPF (ref <=5)
YEAST: NONE SEEN [HPF]

## 2016-03-17 NOTE — Progress Notes (Signed)
Subjective:  Patient ID: Daniel Lawrence, male    DOB: [birth year]  Age: 55 y.o. MRN: [id number]  CC: ? Blood in urine  HPI:  55 year old male with hypertension presents with concerns for hematuria.  Patient reports that on Tuesday he noticed his urine was pink/red after he drank some wine. This subsequently improved but then recurred again. No reports of passage of clots. No reports of dysuria. Denies flank pain/abdominal pain. He states that otherwise he feels well. No recent fevers or chills. He's unsure whether the alcohol intake is playing a role. No other changes in his diet other than drinking hibiscus tea. No other complaints at this time.  Social Hx   Social History   Social History  . Marital status: Married    Spouse name: N/A  . Number of children: N/A  . Years of education: N/A   Social History Main Topics  . Smoking status: Never Smoker  . Smokeless tobacco: Never Used  . Alcohol use 3.6 oz/week    6 Glasses of wine per week  . Drug use: Unknown  . Sexual activity: Not Asked   Other Topics Concern  . None   Social History Narrative  . None    Review of Systems  Constitutional: Negative.   Genitourinary: Positive for hematuria. Negative for dysuria and flank pain.   Objective:  BP 138/82 (BP Location: Left Arm, Patient Position: Sitting, Cuff Size: Normal)   Pulse 72   Temp 98.2 F (36.8 C) (Oral)   Resp 16   Wt 180 lb (81.6 kg)   BMI 25.83 kg/m   BP/Weight 03/16/2016 12/24/2014 [id number]  Systolic BP 0000000 [id number]  Diastolic BP 82 60 72  Wt. (Lbs) 180 187 189.25  BMI 25.83 26.83 27.15   Physical Exam  Constitutional: He is oriented to person, place, and time. He appears well-developed. No distress.  Pulmonary/Chest: Effort normal.  Abdominal: Soft. He exhibits no distension. There is no tenderness. There is no rebound and no guarding.  No CVA tenderness.   Neurological: He is alert and oriented to person, place, and time.  Psychiatric: He has a  normal mood and affect.  Vitals reviewed.  Lab Results  Component Value Date   WBC 4.1 06/03/2015   HGB 16.4 06/03/2015   HCT 48 06/03/2015   PLT 188 06/03/2015   GLUCOSE 99 12/07/2011   CHOL 218 (A) 06/03/2015   TRIG 145 06/03/2015   HDL 48 06/03/2015   LDLDIRECT 154.9 12/07/2011   LDLCALC 156 06/03/2015   ALT 17 06/03/2015   AST 17 06/03/2015   NA 142 06/03/2015   K 4.4 06/03/2015   CL 105 12/07/2011   CREATININE 1.2 06/03/2015   BUN 10 06/03/2015   CO2 27 12/07/2011   TSH 1.44 12/07/2011   PSA 1.0 05/30/2015   INR 1.0 06/23/2014   MICROALBUR 15.4 (H) 06/23/2014   Results for orders placed or performed in visit on 03/16/16 (from the past 24 hour(s))  POCT Urinalysis Dipstick     Status: None   Collection Time: 03/16/16  4:06 PM  Result Value Ref Range   Color, UA Yellow    Clarity, UA Clear    Glucose, UA Negative    Bilirubin, UA Negative    Ketones, UA Trace    Spec Grav, UA 1.020    Blood, UA Large    pH, UA 6.5    Protein, UA 100    Urobilinogen, UA 0.2    Nitrite, UA Negative  Leukocytes, UA Negative Negative    Assessment & Plan:   Problem List Items Addressed This Visit    Gross hematuria - Primary    New problem. Unclear etiology/prognosis at this time. Gross hematuria. No clots passed. UA with large blood.  Sending for micro to confirm. Based on algorithmic approach, planning to send to Urology for CT scan/Cystoscopy. Will place referral.      Relevant Orders   POCT Urinalysis Dipstick (Completed)   Urine Microscopic Only   Urine culture   Ambulatory referral to Urology    Other Visit Diagnoses   None.     Meds ordered this encounter  Medications  . lisinopril (PRINIVIL,ZESTRIL) 10 MG tablet    Sig: TAKE 1 TABLET (10 MG TOTAL) BY MOUTH DAILY.    Dispense:  30 tablet    Refill:  0    Refill for 30 days only.  OFFICE VISIT AND LABS NEEDED prior to any more refills.  NO EXCEPTIONS    Follow-up: PRN  [place]

## 2016-03-17 NOTE — Assessment & Plan Note (Signed)
New problem. Unclear etiology/prognosis at this time. Gross hematuria. No clots passed. UA with large blood.  Sending for micro to confirm. Based on algorithmic approach, planning to send to Urology for CT scan/Cystoscopy. Will place referral.

## 2016-03-18 LAB — URINE CULTURE: ORGANISM ID, BACTERIA: NO GROWTH

## 2016-03-29 DIAGNOSIS — R310 Gross hematuria: Secondary | ICD-10-CM | POA: Diagnosis not present

## 2016-03-29 DIAGNOSIS — Z125 Encounter for screening for malignant neoplasm of prostate: Secondary | ICD-10-CM | POA: Diagnosis not present

## 2016-03-29 LAB — PSA: PSA: 0.37

## 2016-04-02 DIAGNOSIS — R310 Gross hematuria: Secondary | ICD-10-CM | POA: Diagnosis not present

## 2016-04-02 DIAGNOSIS — C641 Malignant neoplasm of right kidney, except renal pelvis: Secondary | ICD-10-CM | POA: Diagnosis not present

## 2016-04-05 ENCOUNTER — Other Ambulatory Visit (INDEPENDENT_AMBULATORY_CARE_PROVIDER_SITE_OTHER): Payer: BLUE CROSS/BLUE SHIELD

## 2016-04-05 ENCOUNTER — Telehealth: Payer: Self-pay | Admitting: Internal Medicine

## 2016-04-05 ENCOUNTER — Other Ambulatory Visit: Payer: Self-pay | Admitting: Urology

## 2016-04-05 DIAGNOSIS — I10 Essential (primary) hypertension: Secondary | ICD-10-CM

## 2016-04-05 DIAGNOSIS — E559 Vitamin D deficiency, unspecified: Secondary | ICD-10-CM | POA: Diagnosis not present

## 2016-04-05 DIAGNOSIS — E785 Hyperlipidemia, unspecified: Secondary | ICD-10-CM | POA: Diagnosis not present

## 2016-04-05 DIAGNOSIS — R5383 Other fatigue: Secondary | ICD-10-CM

## 2016-04-05 DIAGNOSIS — Z131 Encounter for screening for diabetes mellitus: Secondary | ICD-10-CM

## 2016-04-05 LAB — LIPID PANEL
Cholesterol: 198 mg/dL (ref 0–200)
HDL: 42.7 mg/dL (ref >39.00)
LDL Cholesterol: 134 mg/dL — ABNORMAL HIGH (ref 0–99)
NONHDL: 155.07
Total CHOL/HDL Ratio: 5
Triglycerides: 106 mg/dL (ref 0.0–149.0)
VLDL: 21.2 mg/dL (ref 0.0–40.0)

## 2016-04-05 LAB — COMPREHENSIVE METABOLIC PANEL
ALK PHOS: 54 U/L (ref 39–117)
ALT: 15 U/L (ref 0–53)
AST: 15 U/L (ref 0–37)
Albumin: 4.2 g/dL (ref 3.5–5.2)
BILIRUBIN TOTAL: 0.7 mg/dL (ref 0.2–1.2)
BUN: 18 mg/dL (ref 6–23)
CO2: 31 mEq/L (ref 19–32)
Calcium: 9.2 mg/dL (ref 8.4–10.5)
Chloride: 104 mEq/L (ref 96–112)
Creatinine, Ser: 1.15 mg/dL (ref 0.40–1.50)
GFR: 69.66 mL/min (ref >60.00)
GLUCOSE: 96 mg/dL (ref 70–99)
Potassium: 4.5 mEq/L (ref 3.5–5.1)
SODIUM: 140 meq/L (ref 135–145)
TOTAL PROTEIN: 7 g/dL (ref 6.0–8.3)

## 2016-04-05 LAB — HEMOGLOBIN A1C: HEMOGLOBIN A1C: 5.2 % (ref 4.6–6.5)

## 2016-04-05 LAB — VITAMIN D 25 HYDROXY (VIT D DEFICIENCY, FRACTURES): VITD: 60.36 ng/mL (ref 30.00–100.00)

## 2016-04-05 LAB — VITAMIN B12: VITAMIN B 12: 182 pg/mL — AB (ref 211–911)

## 2016-04-05 NOTE — Progress Notes (Signed)
Coming in for pre op-  Please  Add orders in epic

## 2016-04-05 NOTE — Telephone Encounter (Signed)
Health form given to CMA for MD today to fill in labs and have MD sign.Daniel Lawrence

## 2016-04-08 ENCOUNTER — Encounter: Payer: Self-pay | Admitting: Internal Medicine

## 2016-04-08 ENCOUNTER — Other Ambulatory Visit: Payer: Self-pay | Admitting: Internal Medicine

## 2016-04-08 DIAGNOSIS — E538 Deficiency of other specified B group vitamins: Secondary | ICD-10-CM | POA: Insufficient documentation

## 2016-04-08 NOTE — Assessment & Plan Note (Signed)
Needs folic acid level, CBC  and B12 injections

## 2016-04-09 NOTE — Patient Instructions (Signed)
Daniel Lawrence  04/09/2016   Your procedure is scheduled [id number] 04/20/2016  Report to [facility]  Entrance take [place]  elevators to 3rd floor to  [place] at  1115 AM.  Call this number if you have problems the morning of surgery [phone]   Remember: ONLY 1 PERSON MAY GO WITH YOU TO SHORT STAY TO GET  READY MORNING OF [place].              FOLLOW BOWEL PREP INSTRUCTIONS FROM DR. MANNY'S OFFICE THE DAY BEFORE SURGERY WITH A CLEAR LIQUID DIET!    CLEAR LIQUID DIET   Foods Allowed                                                                     Foods Excluded  Coffee and tea, regular and decaf                             liquids that you cannot  Plain Jell-O in any flavor                                             see through such as: Fruit ices (not with fruit pulp)                                     milk, soups, orange juice  Iced Popsicles                                    All solid food Carbonated beverages, regular and diet                                    Cranberry, grape and apple juices Sports drinks like Gatorade Lightly seasoned clear broth or consume(fat free) Sugar, honey syrup  Sample Menu Breakfast                                Lunch                                     Supper Cranberry juice                    Beef broth                            Chicken broth Jell-O                                     Grape juice  Apple juice Coffee or tea                        Jell-O                                      Popsicle                                                Coffee or tea                        Coffee or tea  _____________________________________________________________________     Do not eat food  :After Midnight. MAY HAVE CLEAR LIQUIDS FROM MIDNIGHT UP UNTIL 0715 AM THE MORNING OF SURGERY THEN NOTHING UNTIL AFTER SURGERY!     Take these medicines the morning of surgery  with A SIP OF WATER: NONE                                 You may not have any metal on your body including hair pins and              piercings  Do not wear jewelry, make-up, lotions, powders or perfumes, deodorant             Do not wear nail polish.  Do not shave  48 hours prior to surgery.              Men may shave face and neck.   Do not bring valuables to the hospital. [place].  Contacts, dentures or bridgework may not be worn into surgery.  Leave suitcase in the car. After surgery it may be brought to your room.                  Please read over the following fact sheets you were given: _____________________________________________________________________             [facility] - Preparing for Surgery Before surgery, you can play an important role.  Because skin is not sterile, your skin needs to be as free of germs as possible.  You can reduce the number of germs on your skin by washing with CHG (chlorahexidine gluconate) soap before surgery.  CHG is an antiseptic cleaner which kills germs and bonds with the skin to continue killing germs even after washing. Please DO NOT use if you have an allergy to CHG or antibacterial soaps.  If your skin becomes reddened/irritated stop using the CHG and inform your nurse when you arrive at Short Stay. Do not shave (including legs and underarms) for at least 48 hours prior to the first CHG shower.  You may shave your face/neck. Please follow these instructions carefully:  1.  Shower with CHG Soap the night before surgery and the  morning of Surgery.  2.  If you choose to wash your hair, wash your hair first as usual with your  normal  shampoo.  3.  After you shampoo, rinse your hair and body thoroughly to remove the  shampoo.  4.  Use CHG as you would any other liquid soap.  You can apply chg directly  to the skin and wash                       Gently with a  scrungie or clean washcloth.  5.  Apply the CHG Soap to your body ONLY FROM THE NECK DOWN.   Do not use on face/ open                           Wound or open sores. Avoid contact with eyes, ears mouth and genitals (private parts).                       Wash face,  Genitals (private parts) with your normal soap.             6.  Wash thoroughly, paying special attention to the area where your surgery  will be performed.  7.  Thoroughly rinse your body with warm water from the neck down.  8.  DO NOT shower/wash with your normal soap after using and rinsing off  the CHG Soap.                9.  Pat yourself dry with a clean towel.            10.  Wear clean pajamas.            11.  Place clean sheets on your bed the night of your first shower and do not  sleep with pets. Day of Surgery : Do not apply any lotions/deodorants the morning of surgery.  Please wear clean clothes to the hospital/surgery center.  FAILURE TO FOLLOW THESE INSTRUCTIONS MAY RESULT IN THE CANCELLATION OF YOUR SURGERY PATIENT SIGNATURE_________________________________  NURSE SIGNATURE__________________________________  ________________________________________________________________________   Daniel Lawrence  An incentive spirometer is a tool that can help keep your lungs clear and active. This tool measures how well you are filling your lungs with each breath. Taking long deep breaths may help reverse or decrease the chance of developing breathing (pulmonary) problems (especially infection) following:  A long period of time when you are unable to move or be active. BEFORE THE PROCEDURE   If the spirometer includes an indicator to show your best effort, your nurse or respiratory therapist will set it to a desired goal.  If possible, sit up straight or lean slightly forward. Try not to slouch.  Hold the incentive spirometer in an upright position. INSTRUCTIONS FOR USE  1. Sit on the edge of your bed if possible,  or sit up as far as you can in bed or on a chair. 2. Hold the incentive spirometer in an upright position. 3. Breathe out normally. 4. Place the mouthpiece in your mouth and seal your lips tightly around it. 5. Breathe in slowly and as deeply as possible, raising the piston or the ball toward the top of the column. 6. Hold your breath for 3-5 seconds or for as long as possible. Allow the piston or ball to fall to the bottom of the column. 7. Remove the mouthpiece from your mouth and breathe out normally. 8. Rest for a few seconds and repeat Steps 1 through 7 at least 10 times every 1-2 hours when you are awake. Take your time and take a few normal breaths between deep breaths. 9. The spirometer may include an indicator to  show your best effort. Use the indicator as a goal to work toward during each repetition. 10. After each set of 10 deep breaths, practice coughing to be sure your lungs are clear. If you have an incision (the cut made at the time of surgery), support your incision when coughing by placing a pillow or rolled up towels firmly against it. Once you are able to get out of bed, walk around indoors and cough well. You may stop using the incentive spirometer when instructed by your caregiver.  RISKS AND COMPLICATIONS  Take your time so you do not get dizzy or light-headed.  If you are in pain, you may need to take or ask for pain medication before doing incentive spirometry. It is harder to take a deep breath if you are having pain. AFTER USE  Rest and breathe slowly and easily.  It can be helpful to keep track of a log of your progress. Your caregiver can provide you with a simple table to help with this. If you are using the spirometer at home, follow these instructions: [place] IF:   You are having difficultly using the spirometer.  You have trouble using the spirometer as often as instructed.  Your pain medication is not giving enough relief while using the  spirometer.  You develop fever of 100.5 F (38.1 C) or higher. SEEK IMMEDIATE MEDICAL CARE IF:   You cough up bloody sputum that had not been present before.  You develop fever of 102 F (38.9 C) or greater.  You develop worsening pain at or near the incision site. MAKE SURE YOU:   Understand these instructions.  Will watch your condition.  Will get help right away if you are not doing well or get worse. Document Released: 11/12/2006 Document Revised: 09/24/2011 Document Reviewed: 01/13/2007 ExitCare Patient Information 2014 Exit[employer], [place].   ________________________________________________________________________  WHAT IS A BLOOD TRANSFUSION? Blood Transfusion Information  A transfusion is the replacement of blood or some of its parts. Blood is made up of multiple cells which provide different functions.  Red blood cells carry oxygen and are used for blood loss replacement.  White blood cells fight against infection.  Platelets control bleeding.  Plasma helps clot blood.  Other blood products are available for specialized needs, such as hemophilia or other clotting disorders. BEFORE THE TRANSFUSION  Who gives blood for transfusions?   Healthy volunteers who are fully evaluated to make sure their blood is safe. This is blood bank blood. Transfusion therapy is the safest it has ever been in the practice of medicine. Before blood is taken from a donor, a complete history is taken to make sure that person has no history of diseases nor engages in risky social behavior (examples are intravenous drug use or sexual activity with multiple partners). The donor's travel history is screened to minimize risk of transmitting infections, such as malaria. The donated blood is tested for signs of infectious diseases, such as HIV and hepatitis. The blood is then tested to be sure it is compatible with you in order to minimize the chance of a transfusion reaction. If you or a relative  donates blood, this is often done in anticipation of surgery and is not appropriate for emergency situations. It takes many days to process the donated blood. RISKS AND COMPLICATIONS Although transfusion therapy is very safe and saves many lives, the main dangers of transfusion include:   Getting an infectious disease.  Developing a transfusion reaction. This is an allergic reaction  to something in the blood you were given. Every precaution is taken to prevent this. The decision to have a blood transfusion has been considered carefully by your caregiver before blood is given. Blood is not given unless the benefits outweigh the risks. AFTER THE TRANSFUSION  Right after receiving a blood transfusion, you will usually feel much better and more energetic. This is especially true if your red blood cells have gotten low (anemic). The transfusion raises the level of the red blood cells which carry oxygen, and this usually causes an energy increase.  The nurse administering the transfusion will monitor you carefully for complications. HOME CARE INSTRUCTIONS  No special instructions are needed after a transfusion. You may find your energy is better. Speak with your caregiver about any limitations on activity for underlying diseases you may have. SEEK MEDICAL CARE IF:   Your condition is not improving after your transfusion.  You develop redness or irritation at the intravenous (IV) site. SEEK IMMEDIATE MEDICAL CARE IF:  Any of the following symptoms occur over the next 12 hours:  Shaking chills.  You have a temperature by mouth above 102 F (38.9 C), not controlled by medicine.  Chest, back, or muscle pain.  People around you feel you are not acting correctly or are confused.  Shortness of breath or difficulty breathing.  Dizziness and fainting.  You get a rash or develop hives.  You have a decrease in urine output.  Your urine turns a dark color or changes to pink, red, or brown. Any  of the following symptoms occur over the next 10 days:  You have a temperature by mouth above 102 F (38.9 C), not controlled by medicine.  Shortness of breath.  Weakness after normal activity.  The white part of the eye turns yellow (jaundice).  You have a decrease in the amount of urine or are urinating less often.  Your urine turns a dark color or changes to pink, red, or brown. Document Released: 06/29/2000 Document Revised: 09/24/2011 Document Reviewed: 02/16/2008 [facility] Patient Information 2014 [place], [place].  _______________________________________________________________________

## 2016-04-10 ENCOUNTER — Encounter (HOSPITAL_COMMUNITY): Payer: Self-pay

## 2016-04-10 ENCOUNTER — Encounter (HOSPITAL_COMMUNITY)
Admission: RE | Admit: 2016-04-10 | Discharge: 2016-04-10 | Disposition: A | Discharge status: Home / Self Care | Payer: BLUE CROSS/BLUE SHIELD | Source: Ambulatory Visit | Attending: Urology | Admitting: Urology

## 2016-04-10 DIAGNOSIS — Z01812 Encounter for preprocedural laboratory examination: Secondary | ICD-10-CM | POA: Diagnosis not present

## 2016-04-10 DIAGNOSIS — I10 Essential (primary) hypertension: Secondary | ICD-10-CM | POA: Diagnosis not present

## 2016-04-10 DIAGNOSIS — Z01810 Encounter for preprocedural cardiovascular examination: Secondary | ICD-10-CM | POA: Diagnosis not present

## 2016-04-10 DIAGNOSIS — N2889 Other specified disorders of kidney and ureter: Secondary | ICD-10-CM | POA: Diagnosis not present

## 2016-04-10 LAB — CBC
HCT: 49.2 % (ref 39.0–52.0)
Hemoglobin: 16.6 g/dL (ref 13.0–17.0)
MCH: 30.1 pg (ref 26.0–34.0)
MCHC: 33.7 g/dL (ref 30.0–36.0)
MCV: 89.3 fL (ref 78.0–100.0)
PLATELETS: 218 10*3/uL (ref 150–400)
RBC: 5.51 MIL/uL (ref 4.22–5.81)
RDW: 12.3 % (ref 11.5–15.5)
WBC: 4.2 10*3/uL (ref 4.0–10.5)

## 2016-04-10 LAB — ABO/RH: ABO/RH(D): O POS

## 2016-04-10 NOTE — Progress Notes (Signed)
03/29/2016- Labs- BMP  from [facility] labs on chart

## 2016-04-12 DIAGNOSIS — R911 Solitary pulmonary nodule: Secondary | ICD-10-CM | POA: Diagnosis not present

## 2016-04-12 DIAGNOSIS — Z125 Encounter for screening for malignant neoplasm of prostate: Secondary | ICD-10-CM | POA: Diagnosis not present

## 2016-04-12 DIAGNOSIS — C641 Malignant neoplasm of right kidney, except renal pelvis: Secondary | ICD-10-CM | POA: Diagnosis not present

## 2016-04-12 DIAGNOSIS — R310 Gross hematuria: Secondary | ICD-10-CM | POA: Diagnosis not present

## 2016-04-13 ENCOUNTER — Other Ambulatory Visit: Payer: Self-pay | Admitting: Family Medicine

## 2016-04-20 ENCOUNTER — Encounter (HOSPITAL_COMMUNITY): Payer: Self-pay | Admitting: *Deleted

## 2016-04-20 ENCOUNTER — Inpatient Hospital Stay (HOSPITAL_COMMUNITY): Payer: BLUE CROSS/BLUE SHIELD | Admitting: Registered Nurse

## 2016-04-20 ENCOUNTER — Encounter (HOSPITAL_COMMUNITY)
Admission: RE | Disposition: A | Discharge status: Home / Self Care | Payer: Self-pay | Source: Ambulatory Visit | Attending: Urology

## 2016-04-20 ENCOUNTER — Inpatient Hospital Stay (HOSPITAL_COMMUNITY)
Admission: RE | Admit: 2016-04-20 | Discharge: 2016-04-22 | DRG: 658 | Disposition: A | Discharge status: Home / Self Care | Payer: BLUE CROSS/BLUE SHIELD | Source: Ambulatory Visit | Attending: Urology | Admitting: Urology

## 2016-04-20 DIAGNOSIS — I10 Essential (primary) hypertension: Secondary | ICD-10-CM | POA: Diagnosis not present

## 2016-04-20 DIAGNOSIS — N2889 Other specified disorders of kidney and ureter: Secondary | ICD-10-CM | POA: Diagnosis not present

## 2016-04-20 DIAGNOSIS — C641 Malignant neoplasm of right kidney, except renal pelvis: Principal | ICD-10-CM | POA: Diagnosis present

## 2016-04-20 DIAGNOSIS — E785 Hyperlipidemia, unspecified: Secondary | ICD-10-CM | POA: Diagnosis not present

## 2016-04-20 DIAGNOSIS — Z85528 Personal history of other malignant neoplasm of kidney: Secondary | ICD-10-CM | POA: Diagnosis present

## 2016-04-20 DIAGNOSIS — Z881 Allergy status to other antibiotic agents status: Secondary | ICD-10-CM | POA: Diagnosis not present

## 2016-04-20 DIAGNOSIS — D49511 Neoplasm of unspecified behavior of right kidney: Secondary | ICD-10-CM | POA: Diagnosis not present

## 2016-04-20 DIAGNOSIS — I129 Hypertensive chronic kidney disease with stage 1 through stage 4 chronic kidney disease, or unspecified chronic kidney disease: Secondary | ICD-10-CM | POA: Diagnosis not present

## 2016-04-20 HISTORY — PX: ROBOT ASSISTED LAPAROSCOPIC NEPHRECTOMY: SHX5140

## 2016-04-20 LAB — HEMOGLOBIN AND HEMATOCRIT, BLOOD
HEMATOCRIT: 44 % (ref 39.0–52.0)
HEMOGLOBIN: 15 g/dL (ref 13.0–17.0)

## 2016-04-20 LAB — TYPE AND SCREEN
ABO/RH(D): O POS
ANTIBODY SCREEN: NEGATIVE

## 2016-04-20 SURGERY — NEPHRECTOMY, RADICAL, ROBOT-ASSISTED, LAPAROSCOPIC, ADULT
Anesthesia: General | Site: Abdomen | Laterality: Right

## 2016-04-20 MED ORDER — FENTANYL CITRATE (PF) 100 MCG/2ML IJ SOLN
INTRAMUSCULAR | Status: DC | PRN
  Administered 2016-04-20: 100 ug via INTRAVENOUS
  Administered 2016-04-20 (×4): 50 ug via INTRAVENOUS
  Administered 2016-04-20 (×2): 100 ug via INTRAVENOUS

## 2016-04-20 MED ORDER — PHENYLEPHRINE HCL 10 MG/ML IJ SOLN
INTRAVENOUS | Status: DC | PRN
  Administered 2016-04-20: 10 ug/min via INTRAVENOUS

## 2016-04-20 MED ORDER — SODIUM CHLORIDE 0.9 % IJ SOLN
INTRAMUSCULAR | Status: DC | PRN
  Administered 2016-04-20: 20 mL

## 2016-04-20 MED ORDER — EPHEDRINE 5 MG/ML INJ
INTRAVENOUS | Status: AC
  Filled 2016-04-20: qty 10

## 2016-04-20 MED ORDER — PHENYLEPHRINE 40 MCG/ML (10ML) SYRINGE FOR IV PUSH (FOR BLOOD PRESSURE SUPPORT)
PREFILLED_SYRINGE | INTRAVENOUS | Status: DC | PRN
  Administered 2016-04-20: 40 ug via INTRAVENOUS
  Administered 2016-04-20: 80 ug via INTRAVENOUS
  Administered 2016-04-20: 40 ug via INTRAVENOUS
  Administered 2016-04-20: 80 ug via INTRAVENOUS

## 2016-04-20 MED ORDER — HYDROMORPHONE HCL 1 MG/ML IJ SOLN
0.2500 mg | INTRAMUSCULAR | Status: DC | PRN
  Administered 2016-04-20 (×2): 0.5 mg via INTRAVENOUS

## 2016-04-20 MED ORDER — FENTANYL CITRATE (PF) 100 MCG/2ML IJ SOLN
INTRAMUSCULAR | Status: AC
  Administered 2016-04-20: 50 ug via INTRAVENOUS
  Filled 2016-04-20: qty 2

## 2016-04-20 MED ORDER — LACTATED RINGERS IV SOLN
INTRAVENOUS | Status: DC
  Administered 2016-04-20 (×3): via INTRAVENOUS

## 2016-04-20 MED ORDER — ROCURONIUM BROMIDE 10 MG/ML (PF) SYRINGE
PREFILLED_SYRINGE | INTRAVENOUS | Status: DC | PRN
  Administered 2016-04-20: 40 mg via INTRAVENOUS
  Administered 2016-04-20: 10 mg via INTRAVENOUS
  Administered 2016-04-20: 20 mg via INTRAVENOUS
  Administered 2016-04-20: 10 mg via INTRAVENOUS
  Administered 2016-04-20: 20 mg via INTRAVENOUS

## 2016-04-20 MED ORDER — HYDROMORPHONE HCL 1 MG/ML IJ SOLN
INTRAMUSCULAR | Status: AC
  Administered 2016-04-20: 0.5 mg via INTRAVENOUS
  Filled 2016-04-20: qty 1

## 2016-04-20 MED ORDER — ONDANSETRON HCL 4 MG/2ML IJ SOLN
4.0000 mg | INTRAMUSCULAR | Status: DC | PRN
  Administered 2016-04-20 – 2016-04-21 (×2): 4 mg via INTRAVENOUS
  Filled 2016-04-20 (×2): qty 2

## 2016-04-20 MED ORDER — SUCCINYLCHOLINE CHLORIDE 200 MG/10ML IV SOSY
PREFILLED_SYRINGE | INTRAVENOUS | Status: DC | PRN
  Administered 2016-04-20: 100 mg via INTRAVENOUS

## 2016-04-20 MED ORDER — ONDANSETRON HCL 4 MG/2ML IJ SOLN
INTRAMUSCULAR | Status: DC | PRN
  Administered 2016-04-20: 4 mg via INTRAVENOUS

## 2016-04-20 MED ORDER — FENTANYL CITRATE (PF) 100 MCG/2ML IJ SOLN
25.0000 ug | INTRAMUSCULAR | Status: DC | PRN
  Administered 2016-04-20 (×2): 50 ug via INTRAVENOUS

## 2016-04-20 MED ORDER — CEFAZOLIN SODIUM-DEXTROSE 2-4 GM/100ML-% IV SOLN
2.0000 g | INTRAVENOUS | Status: AC
  Administered 2016-04-20: 2 g via INTRAVENOUS
  Filled 2016-04-20: qty 100

## 2016-04-20 MED ORDER — BUPIVACAINE LIPOSOME 1.3 % IJ SUSP
20.0000 mL | Freq: Once | INTRAMUSCULAR | Status: AC
  Administered 2016-04-20: 20 mL
  Filled 2016-04-20: qty 20

## 2016-04-20 MED ORDER — PROMETHAZINE HCL 25 MG/ML IJ SOLN: 6.2500 mg | INTRAMUSCULAR | Status: DC | PRN

## 2016-04-20 MED ORDER — SODIUM CHLORIDE 0.9 % IJ SOLN
INTRAMUSCULAR | Status: AC
  Filled 2016-04-20: qty 20

## 2016-04-20 MED ORDER — ONDANSETRON HCL 4 MG/2ML IJ SOLN
INTRAMUSCULAR | Status: AC
Start: 2016-04-20 — End: 2016-04-20
  Filled 2016-04-20: qty 2

## 2016-04-20 MED ORDER — HYDROMORPHONE HCL 1 MG/ML IJ SOLN
0.5000 mg | INTRAMUSCULAR | Status: DC | PRN
  Administered 2016-04-20 – 2016-04-21 (×3): 1 mg via INTRAVENOUS
  Filled 2016-04-20 (×4): qty 1

## 2016-04-20 MED ORDER — MIDAZOLAM HCL 2 MG/2ML IJ SOLN
INTRAMUSCULAR | Status: AC
  Filled 2016-04-20: qty 2

## 2016-04-20 MED ORDER — EPHEDRINE SULFATE-NACL 50-0.9 MG/10ML-% IV SOSY
PREFILLED_SYRINGE | INTRAVENOUS | Status: DC | PRN
  Administered 2016-04-20 (×4): 5 mg via INTRAVENOUS

## 2016-04-20 MED ORDER — PROPOFOL 10 MG/ML IV BOLUS
INTRAVENOUS | Status: AC
  Filled 2016-04-20: qty 20

## 2016-04-20 MED ORDER — DIPHENHYDRAMINE HCL 50 MG/ML IJ SOLN: 12.5000 mg | Freq: Four times a day (QID) | INTRAMUSCULAR | Status: DC | PRN

## 2016-04-20 MED ORDER — PHENYLEPHRINE HCL 10 MG/ML IJ SOLN
INTRAMUSCULAR | Status: AC
  Filled 2016-04-20: qty 2

## 2016-04-20 MED ORDER — MIDAZOLAM HCL 5 MG/5ML IJ SOLN
INTRAMUSCULAR | Status: DC | PRN
  Administered 2016-04-20: 2 mg via INTRAVENOUS

## 2016-04-20 MED ORDER — CEFAZOLIN SODIUM-DEXTROSE 2-4 GM/100ML-% IV SOLN
INTRAVENOUS | Status: AC
  Filled 2016-04-20: qty 100

## 2016-04-20 MED ORDER — LIDOCAINE 2% (20 MG/ML) 5 ML SYRINGE
INTRAMUSCULAR | Status: AC
  Filled 2016-04-20: qty 5

## 2016-04-20 MED ORDER — MAGNESIUM CITRATE PO SOLN
1.0000 | Freq: Once | ORAL | Status: DC
  Filled 2016-04-20: qty 296

## 2016-04-20 MED ORDER — FENTANYL CITRATE (PF) 250 MCG/5ML IJ SOLN
INTRAMUSCULAR | Status: AC
  Filled 2016-04-20: qty 5

## 2016-04-20 MED ORDER — PHENYLEPHRINE 40 MCG/ML (10ML) SYRINGE FOR IV PUSH (FOR BLOOD PRESSURE SUPPORT)
PREFILLED_SYRINGE | INTRAVENOUS | Status: AC
  Filled 2016-04-20: qty 10

## 2016-04-20 MED ORDER — STERILE WATER FOR IRRIGATION IR SOLN
Status: DC | PRN
  Administered 2016-04-20: 1000 mL

## 2016-04-20 MED ORDER — LIDOCAINE HCL (CARDIAC) 20 MG/ML IV SOLN
INTRAVENOUS | Status: DC | PRN
  Administered 2016-04-20: 100 mg via INTRAVENOUS

## 2016-04-20 MED ORDER — LACTATED RINGERS IR SOLN
Status: DC | PRN
  Administered 2016-04-20: 1

## 2016-04-20 MED ORDER — ACETAMINOPHEN 500 MG PO TABS
1000.0000 mg | ORAL_TABLET | Freq: Four times a day (QID) | ORAL | Status: AC
  Administered 2016-04-20 – 2016-04-21 (×3): 1000 mg via ORAL
  Filled 2016-04-20 (×3): qty 2

## 2016-04-20 MED ORDER — DIPHENHYDRAMINE HCL 12.5 MG/5ML PO ELIX: 12.5000 mg | ORAL_SOLUTION | Freq: Four times a day (QID) | ORAL | Status: DC | PRN

## 2016-04-20 MED ORDER — OXYCODONE HCL 5 MG PO TABS
5.0000 mg | ORAL_TABLET | ORAL | Status: DC | PRN
  Administered 2016-04-20 – 2016-04-21 (×3): 5 mg via ORAL
  Filled 2016-04-20 (×3): qty 1

## 2016-04-20 MED ORDER — ROCURONIUM BROMIDE 10 MG/ML (PF) SYRINGE
PREFILLED_SYRINGE | INTRAVENOUS | Status: AC
  Filled 2016-04-20: qty 10

## 2016-04-20 MED ORDER — SUGAMMADEX SODIUM 200 MG/2ML IV SOLN
INTRAVENOUS | Status: AC
  Filled 2016-04-20: qty 2

## 2016-04-20 MED ORDER — DEXTROSE-NACL 5-0.45 % IV SOLN
INTRAVENOUS | Status: DC
  Administered 2016-04-20 – 2016-04-21 (×2): via INTRAVENOUS
  Administered 2016-04-21: 125 mL/h via INTRAVENOUS
  Administered 2016-04-21 – 2016-04-22 (×2): via INTRAVENOUS

## 2016-04-20 MED ORDER — SUGAMMADEX SODIUM 200 MG/2ML IV SOLN
INTRAVENOUS | Status: DC | PRN
  Administered 2016-04-20: 200 mg via INTRAVENOUS

## 2016-04-20 MED ORDER — PROPOFOL 10 MG/ML IV BOLUS
INTRAVENOUS | Status: DC | PRN
  Administered 2016-04-20: 180 mg via INTRAVENOUS
  Administered 2016-04-20: 20 mg via INTRAVENOUS

## 2016-04-20 MED ORDER — HYDROCODONE-ACETAMINOPHEN 5-325 MG PO TABS: 1.0000 | ORAL_TABLET | Freq: Four times a day (QID) | ORAL | 0 refills | Status: DC | PRN

## 2016-04-20 SURGICAL SUPPLY — 61 items
BAG LAPAROSCOPIC 12 15 PORT 16 (BASKET) ×1 IMPLANT
BAG RETRIEVAL 12/15 (BASKET) ×2
BAG RETRIEVAL 12/15MM (BASKET) ×1
CHLORAPREP W/TINT 26ML (MISCELLANEOUS) ×3 IMPLANT
CLIP LIGATING HEM O LOK PURPLE (MISCELLANEOUS) ×3 IMPLANT
CLIP LIGATING HEMO LOK XL GOLD (MISCELLANEOUS) ×3 IMPLANT
CLIP LIGATING HEMO O LOK GREEN (MISCELLANEOUS) ×3 IMPLANT
COVER TIP SHEARS 8 DVNC (MISCELLANEOUS) ×1 IMPLANT
COVER TIP SHEARS 8MM DA VINCI (MISCELLANEOUS) ×2
CUTTER ECHEON FLEX ENDO 45 340 (ENDOMECHANICALS) ×3 IMPLANT
DECANTER SPIKE VIAL GLASS SM (MISCELLANEOUS) ×3 IMPLANT
DERMABOND ADVANCED (GAUZE/BANDAGES/DRESSINGS) ×2
DERMABOND ADVANCED .7 DNX12 (GAUZE/BANDAGES/DRESSINGS) ×1 IMPLANT
DRAIN CHANNEL 15F RND FF 3/16 (WOUND CARE) IMPLANT
DRAPE ARM DVNC X/XI (DISPOSABLE) ×4 IMPLANT
DRAPE COLUMN DVNC XI (DISPOSABLE) ×1 IMPLANT
DRAPE DA VINCI XI ARM (DISPOSABLE) ×8
DRAPE DA VINCI XI COLUMN (DISPOSABLE) ×2
DRAPE INCISE IOBAN 66X45 STRL (DRAPES) ×3 IMPLANT
DRAPE LAPAROSCOPIC ABDOMINAL (DRAPES) ×3 IMPLANT
DRAPE SHEET LG 3/4 BI-LAMINATE (DRAPES) ×3 IMPLANT
ELECT PENCIL ROCKER SW 15FT (MISCELLANEOUS) ×3 IMPLANT
ELECT REM PT RETURN 9FT ADLT (ELECTROSURGICAL) ×6
ELECTRODE REM PT RTRN 9FT ADLT (ELECTROSURGICAL) ×2 IMPLANT
EVACUATOR SILICONE 100CC (DRAIN) IMPLANT
GLOVE BIO SURGEON STRL SZ 6.5 (GLOVE) ×2 IMPLANT
GLOVE BIO SURGEONS STRL SZ 6.5 (GLOVE) ×1
GLOVE BIOGEL M STRL SZ7.5 (GLOVE) ×6 IMPLANT
GOWN STRL REUS W/TWL LRG LVL3 (GOWN DISPOSABLE) ×9 IMPLANT
HEMOSTAT SURGICEL 4X8 (HEMOSTASIS) ×3 IMPLANT
IRRIG SUCT STRYKERFLOW 2 WTIP (MISCELLANEOUS)
IRRIGATION SUCT STRKRFLW 2 WTP (MISCELLANEOUS) IMPLANT
KIT BASIN OR (CUSTOM PROCEDURE TRAY) ×3 IMPLANT
LOOP VESSEL MAXI BLUE (MISCELLANEOUS) ×3 IMPLANT
NEEDLE INSUFFLATION 14GA 120MM (NEEDLE) ×3 IMPLANT
PORT ACCESS TROCAR AIRSEAL 12 (TROCAR) ×1 IMPLANT
PORT ACCESS TROCAR AIRSEAL 5M (TROCAR) ×2
RELOAD STAPLER WHITE 60MM (STAPLE) IMPLANT
RELOAD WH ECHELON 45 (STAPLE) ×21 IMPLANT
SEAL CANN UNIV 5-8 DVNC XI (MISCELLANEOUS) ×4 IMPLANT
SEAL XI 5MM-8MM UNIVERSAL (MISCELLANEOUS) ×8
SET TRI-LUMEN FLTR TB AIRSEAL (TUBING) ×3 IMPLANT
SOLUTION ELECTROLUBE (MISCELLANEOUS) ×3 IMPLANT
SPONGE LAP 4X18 X RAY DECT (DISPOSABLE) ×3 IMPLANT
STAPLE ECHEON FLEX 60 POW ENDO (STAPLE) IMPLANT
STAPLER RELOAD WHITE 60MM (STAPLE)
SUT ETHILON 3 0 PS 1 (SUTURE) IMPLANT
SUT MNCRL AB 4-0 PS2 18 (SUTURE) ×6 IMPLANT
SUT PDS AB 1 CT1 27 (SUTURE) ×12 IMPLANT
SUT PROLENE 4 0 RB 1 (SUTURE) ×2
SUT PROLENE 4-0 RB1 .5 CRCL 36 (SUTURE) ×1 IMPLANT
SUT VICRYL 0 UR6 27IN ABS (SUTURE) IMPLANT
TAPE STRIPS DRAPE STRL (GAUZE/BANDAGES/DRESSINGS) ×3 IMPLANT
TOWEL OR NON WOVEN STRL DISP B (DISPOSABLE) ×6 IMPLANT
TRAY FOLEY W/METER SILVER 16FR (SET/KITS/TRAYS/PACK) ×3 IMPLANT
TRAY LAPAROSCOPIC (CUSTOM PROCEDURE TRAY) ×3 IMPLANT
TROCAR BLADELESS OPT 12M 100M (ENDOMECHANICALS) ×3 IMPLANT
TROCAR BLADELESS OPT 5 100 (ENDOMECHANICALS) ×3 IMPLANT
TROCAR UNIVERSAL OPT 12M 100M (ENDOMECHANICALS) ×3 IMPLANT
TROCAR XCEL 12X100 BLDLESS (ENDOMECHANICALS) ×3 IMPLANT
WATER STERILE IRR 1500ML POUR (IV SOLUTION) ×6 IMPLANT

## 2016-04-20 NOTE — Transfer of Care (Signed)
Immediate Anesthesia Transfer of Care Note  Patient: Daniel Lawrence  Procedure(s) Performed: Procedure(s): XI ROBOTIC ASSISTED LAPAROSCOPIC RADICAL NEPHRECTOMY (Right)  Patient Location: PACU  Anesthesia Type:General  Level of Consciousness: awake, alert , oriented and patient cooperative  Airway & Oxygen Therapy: Patient Spontanous Breathing and Patient connected to face mask oxygen  Post-op Assessment: Report given to RN, Post -op Vital signs reviewed and stable and Patient moving all extremities X 4  Post vital signs: stable  Last Vitals:  Vitals:   04/20/16 1134  BP: 121/78  Pulse: 75  Resp: 18  Temp: 36.4 C    Last Pain:  Vitals:   04/20/16 1134  TempSrc: Oral         Complications: No apparent anesthesia complications

## 2016-04-20 NOTE — Discharge Instructions (Signed)

## 2016-04-20 NOTE — Anesthesia Preprocedure Evaluation (Addendum)
Anesthesia Evaluation  Patient identified by MRN, date of birth, ID band Patient awake    Reviewed: Allergy & Precautions, NPO status , Patient's Chart, lab work & pertinent test results  Airway Mallampati: II  TM Distance: >3 FB Neck ROM: Full    Dental  (+) Dental Advisory Given, Teeth Intact   Pulmonary    breath sounds clear to auscultation       Cardiovascular hypertension, Pt. on medications  Rhythm:Regular Rate:Normal  HLD  EKG 04/10/2016: NSR   Neuro/Psych    GI/Hepatic   Endo/Other    Renal/GU Renal disease (right renal cancer)     Musculoskeletal   Abdominal   Peds  Hematology   Anesthesia Other Findings B12 deficiency  Reproductive/Obstetrics                            Lab Results  Component Value Date   WBC 4.2 04/10/2016   HGB 16.6 04/10/2016   HCT 49.2 04/10/2016   MCV 89.3 04/10/2016   PLT 218 04/10/2016   Lab Results  Component Value Date   CREATININE 1.15 04/05/2016   BUN 18 04/05/2016   NA 140 04/05/2016   K 4.5 04/05/2016   CL 104 04/05/2016   CO2 31 04/05/2016    Anesthesia Physical Anesthesia Plan  ASA: II  Anesthesia Plan: General   Post-op Pain Management:    Induction: Intravenous  Airway Management Planned: Oral ETT  Additional Equipment:   Intra-op Plan:   Post-operative Plan: Extubation in OR  Informed Consent:   Dental advisory given  Plan Discussed with: CRNA  Anesthesia Plan Comments: (Risks of general anesthesia discussed including, but not limited to, sore throat, hoarse voice, chipped/damaged teeth, injury to vocal cords, nausea and vomiting, allergic reactions, lung infection, heart attack, stroke, and death. All questions answered. )       Anesthesia Quick Evaluation

## 2016-04-20 NOTE — Anesthesia Postprocedure Evaluation (Signed)
Anesthesia Post Note  Patient: Daniel Lawrence  Procedure(s) Performed: Procedure(s) (LRB): XI ROBOTIC ASSISTED LAPAROSCOPIC RADICAL NEPHRECTOMY (Right)  Patient location during evaluation: PACU Anesthesia Type: General Level of consciousness: awake and alert Pain management: pain level controlled Vital Signs Assessment: post-procedure vital signs reviewed and stable Respiratory status: spontaneous breathing, nonlabored ventilation, respiratory function stable and patient connected to nasal cannula oxygen Cardiovascular status: blood pressure returned to baseline and stable Postop Assessment: no signs of nausea or vomiting Anesthetic complications: no    Last Vitals:  Vitals:   04/20/16 1800 04/20/16 1820  BP: 124/66 124/65  Pulse: 81 75  Resp: 12 13  Temp: 36.7 C 37 C    Last Pain:  Vitals:   04/20/16 1832  TempSrc:   PainSc: Tyler Deis

## 2016-04-20 NOTE — Anesthesia Procedure Notes (Signed)
Procedure Name: Intubation Date/Time: 04/20/2016 2:07 PM Performed by: Carleene Cooper A Pre-anesthesia Checklist: Patient identified, Timeout performed, Emergency Drugs available, Suction available and Patient being monitored Patient Re-evaluated:Patient Re-evaluated prior to inductionOxygen Delivery Method: Circle system utilized Preoxygenation: Pre-oxygenation with 100% oxygen Intubation Type: IV induction Ventilation: Mask ventilation without difficulty Laryngoscope Size: Mac and 4 Grade View: Grade I Tube type: Oral Tube size: 7.5 mm Number of attempts: 1 Airway Equipment and Method: Stylet Placement Confirmation: ETT inserted through vocal cords under direct vision,  positive ETCO2 and breath sounds checked- equal and bilateral Secured at: 23 cm Tube secured with: Tape Dental Injury: Teeth and Oropharynx as per pre-operative assessment

## 2016-04-20 NOTE — Brief Op Note (Signed)
04/20/2016  5:06 PM  PATIENT:  Daniel Lawrence  55 y.o. male  PRE-OPERATIVE DIAGNOSIS:  LARGE RIGHT RENAL MASS  POST-OPERATIVE DIAGNOSIS:  LARGE RIGHT RENAL MASS  PROCEDURE:  Procedure(s): XI ROBOTIC ASSISTED LAPAROSCOPIC RADICAL NEPHRECTOMY (Right)  SURGEON:  Surgeon(s) and Role:    * Alexis Frock, MD - Primary  PHYSICIAN ASSISTANT:   ASSISTANTS: Debbrah Alar, PA   ANESTHESIA:   local and general  EBL:  Total I/O In: 2000 [I.V.:2000] Out: 480 [Urine:280; Blood:200]  BLOOD ADMINISTERED:none  DRAINS: foley to gravity   LOCAL MEDICATIONS USED:  MARCAINE     SPECIMEN:  Source of Specimen:  Rt radical nephrectomy  DISPOSITION OF SPECIMEN:  PATHOLOGY  COUNTS:  YES  TOURNIQUET:  * No tourniquets in log *  DICTATION: .Other Dictation: Dictation Number [id number]  PLAN OF CARE: Admit to inpatient   PATIENT DISPOSITION:  PACU - hemodynamically stable.   Delay start of Pharmacological VTE agent (>24hrs) due to surgical blood loss or risk of bleeding: yes
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
## 2016-04-21 LAB — BASIC METABOLIC PANEL
ANION GAP: 5 (ref 5–15)
BUN: 13 mg/dL (ref 6–20)
CALCIUM: 8.4 mg/dL — AB (ref 8.9–10.3)
CHLORIDE: 102 mmol/L (ref 101–111)
CO2: 29 mmol/L (ref 22–32)
CREATININE: 1.63 mg/dL — AB (ref 0.61–1.24)
GFR calc non Af Amer: 45 mL/min — ABNORMAL LOW (ref >60)
GFR, EST AFRICAN AMERICAN: 52 mL/min — AB (ref >60)
Glucose, Bld: 163 mg/dL — ABNORMAL HIGH (ref 65–99)
Potassium: 4.6 mmol/L (ref 3.5–5.1)
SODIUM: 136 mmol/L (ref 135–145)

## 2016-04-21 LAB — HEMOGLOBIN AND HEMATOCRIT, BLOOD
HEMATOCRIT: 38.8 % — AB (ref 39.0–52.0)
HEMOGLOBIN: 13.4 g/dL (ref 13.0–17.0)

## 2016-04-21 NOTE — Op Note (Signed)
NAMEPEYTON, FRIGO NO.:  [id number]  MEDICAL RECORD NO.:  [id number]  LOCATION:  [id number]                         FACILITY:  [facility]  PHYSICIAN:  Alexis Frock, MD     DATE OF BIRTH:  05-12-1961   DATE OF PROCEDURE: 04/20/2016                                OPERATIVE REPORT   DIAGNOSIS:  Large right renal mass.  PROCEDURE:  Right robotic radical nephrectomy.  ESTIMATED BLOOD LOSS:  200 mL.  COMPLICATION:  None.  SPECIMEN:  Right radical nephrectomy.  ASSISTANT:  Debbrah Alar, PA.  FINDINGS: 1. Single artery, single vein right renovascular anatomy as     anticipated. 2. Numerous parasitic vessels, mostly laterally and superiorly as     anticipated. 3. Large upper pole renal cyst, intertwined with the adrenal gland,     large lateral renal mass.  INDICATION:  [name] is a very pleasant and otherwise healthy 55- year-old gentleman, who was found on workup of gross hematuria to have a large right enhancing renal mass worrisome for renal cell carcinoma. Given the size of the lesion, he underwent staging with chest CT that was unremarkable.  Options were discussed for management including palliative only protocol versus curative intent surgery with and without minimally-invasive assistance and he adamantly wished to proceed with radical nephrectomy.  Informed consent obtained and placed in the medical record.  PROCEDURE IN DETAIL:  The patient being Daniel Lawrence, was verified. Procedure being right radical nephrectomy was confirmed.  Procedure was carried out.  Time-out was performed.  Intravenous antibiotics were administered.  General endotracheal anesthesia was introduced.  Foley catheter was placed per urethra to straight drain.  The patient was then placed into a right side up full flank position, applying 15 degrees of stable flexion, superior arm elevator, axillary roll, sequential compression devices, bottom leg bent, top leg straight.  He  was further fashioned to the operating table using a beanbag and 3-inch tape over foam padding across his supraxiphoid chest and his pelvis.  Sterile field was created by first clipper shaving and then prepping and draping the patient's entire right flank and abdomen using chlorhexidine gluconate.  Next, a high-flow, low-pressure pneumoperitoneum was obtained using Veress technique in the right lower quadrant having passed the aspiration and drop test.  Next, an 8-mm robotic camera port was placed and positioned approximately 1 handbreadth superolateral to the umbilicus.  Laparoscopic examination of the peritoneal cavity did reveal some adhesions in the right lower quadrant of the area of the cecum and ileocecal junction.  It was felt most likely represent remote prior appendicitis.  Additional ports were then placed as follows: Right subcostal 8-mm robotic port, right far lateral 8-mm robotic port approximately 4 fingerbreadths superomedial to the anterior iliac spine, right inferior paramedian robotic port approximately 1 handbreadth superior to the pubic ramus, two 12-mm assistant ports in the midline, one 3 fingerbreadths above the camera port, one 3 fingerbreadths below, which was in the infraumbilical crease and finally a 5-mm subxiphoid port.  Robot was docked and passed through the electronic checks. Initial attention was directed to limited adhesiolysis, mostly dense omental adhesions and some abdominal enteric adhesions were taken down into the right lower  quadrant.  I freeing up the area of the cecum and ileocecal junction and allowing the psoas musculature to be identified. The retroperitoneum was further developed by sweeping the ascending colon medially away from the anterior surface of Gerota's fascia from the area of the cecum towards the area of the hepatic flexure.  There were some dense adhesions between the inferior aspect of the liver and the anterior superior aspect  of Gerota's fascia, likely inflammatory in origin.  These were very carefully taken, only keeping a fat pad was delivered, this provided more liver mobility and it was then retracted superiorly by placing a self-locking grasper through the subxiphoid port.  The duodenum was then encountered and kocherized medially, such that its lateral border lie medial to the lateral border of the inferior vena cava.  Lower pole of the kidney was identified and placed on gentle lateral traction.  Dissection proceeded inferomedial to this.  The gonadal vessels and ureter were encountered.  The gonadal vessels were kept medially, the ureter was kept laterally and the psoas musculature was once again identified.  Dissection proceeded within this triangle superiorly towards the area of the renal hilum.  The renal hilum consisted of a single-vein, single-artery renovascular anatomy as anticipated.  There were some small parasitic vessels around the hilum, but relatively minimal in this location.  The artery was controlled using extra large vascular clip proximally and vascular load stapler distally.  The vein was then controlled using vascular load stapler. Dissection proceeded superiorly in effort to perform maximal adrenalectomy given the apposition of cystic mass into the adrenal gland.  Very careful dissection was performed directly on the lateral border of the inferior vena cava towards the area of the liver, thus freeing up medial adrenal attachments.  Superior dissection was carefully performed in a partial adrenal fashion using a vascular load stapler across the adrenal gland.  There was some minor venous oozing from the area of the adrenal gland, which was easily controlled using monopolar energy and Surgicel was applied to this, which resulted in excellent hemostasis.  Additional lateral attachments were taken down using cautery scissors and bipolar energy selectively on several additional  parasitic vessels and taking exquisite care to keep a wide fat pad with the large right renal mass.  Finally, the ureter was doubly clipped and ligated.  Additional inferior attachment was taken down, this completely freed up the right radical nephrectomy specimen, which was placed into an extra-large endoscopic retrieval bag.  At this point, the area of the renal hilum was inspected and found to be excellent hemostasis.  The adrenal bed was once again also inspected, found to be with excellent hemostasis.  The liver retractor was taken down.  There was no evidence of liver injury.  Sponge and needle counts were correct. Robot was then undocked.  Specimen was retrieved by connecting the two prior assistant port sites in the midline, removing the radical nephrectomy specimen and setting it aside for permanent pathology.  This site was closed at the level of the fascia using figure-of-eight PDS x6, following reapproximation of Scarpa's with running Vicryl.  All incision sites were infiltrated with dilute lyophilized Marcaine and closed at the level of the skin using subcuticular Monocryl followed by Dermabond. Procedure was terminated.  The patient tolerated the procedure well. There were no immediate periprocedural complications.  The patient was taken to the postanesthesia care unit in stable condition.          ______________________________ Alexis Frock, MD  TM/MEDQ  D:  04/20/2016  T:  04/21/2016  Job:  [id number]

## 2016-04-21 NOTE — Progress Notes (Signed)
1 Day Post-Op Subjective: Patient reports mild abdominal pain. Ambulating without difficulty. FOley removed this morning. Tolerating clears  Objective: Vital signs in last 24 hours: Temp:  [97.5 F (36.4 C)-100 F (37.8 C)] 99.6 F (37.6 C) (10/07 2032) Pulse Rate:  [66-77] 74 (10/07 2032) Resp:  [16] 16 (10/07 2032) BP: (91-119)/(55-66) 104/66 (10/07 2032) SpO2:  [98 %-100 %] 98 % (10/07 2032)  Intake/Output from previous day: 10/06 0701 - 10/07 0700 In: 4477.1 [I.V.:4477.1] Out: 1230 [Urine:1030; Blood:200] Intake/Output this shift: Total I/O In: -  Out: 250 [Urine:250]  Physical Exam:  General:alert, cooperative and appears stated age GI: tenderness: suprapubic Male genitalia: not done Extremities: extremities normal, atraumatic, no cyanosis or edema  Lab Results:  Recent Labs  04/20/16 1741 04/21/16 0549  HGB 15.0 13.4  HCT 44.0 38.8*   BMET  Recent Labs  04/21/16 0549  NA 136  K 4.6  CL 102  CO2 29  GLUCOSE 163*  BUN 13  CREATININE 1.63*  CALCIUM 8.4*   No results for input(s): LABPT, INR in the last 72 hours. No results for input(s): LABURIN in the last 72 hours. Results for orders placed or performed in visit on 03/16/16  Urine culture     Status: None   Collection Time: 03/16/16  4:06 PM  Result Value Ref Range Status   Organism ID, Bacteria NO GROWTH  Final    Studies/Results: No results found.  Assessment/Plan: POD#1 right radical nephrectomy  1. Ambulate in halls 2. D/C IVF 3. Advance diet to regular tonight   LOS: 1 day   Nicolette Bang 04/21/2016, 10:00 PM

## 2016-04-22 MED ORDER — LACTULOSE 10 GM/15ML PO SOLN
20.0000 g | Freq: Every day | ORAL | Status: DC | PRN
  Administered 2016-04-22: 20 g via ORAL
  Filled 2016-04-22: qty 30

## 2016-04-22 NOTE — Progress Notes (Signed)
RN reviewed discharge paperwork with patient and family member. All questions were answered.  Incision was clean, dry and intact with a little pink around edges. Reviewed skin care directions with patient.   Discharge paperwork and prescriptions were given.   NT rolled patient down to family car.

## 2016-04-23 ENCOUNTER — Encounter (HOSPITAL_COMMUNITY): Payer: Self-pay | Admitting: Urology

## 2016-04-27 ENCOUNTER — Encounter: Payer: BLUE CROSS/BLUE SHIELD | Admitting: Internal Medicine

## 2016-05-03 NOTE — Discharge Summary (Signed)
Physician Discharge Summary  Patient ID: Daniel Lawrence MRN: [id number] DOB/AGE: 01/07/1961 55 y.o.  Admit date: 04/20/2016 Discharge date: 04/22/2016 Admission Diagnoses:  Discharge Diagnoses:  Active Problems:   Renal mass   Discharged Condition: good  Hospital Course: The patient tolerated the procedure well and was transferred to the floor on IV pain meds, IV fluid. On POD#1 foley was removed, pt was started on clear liquid diet and they ambulated in the halls. On POD#2 the patient was transitioned to a regular diet, IVFs were discontinued, and the patient passed flatus. Prior to discharge the pt was tolerating a regular diet, pain was controlled on PO pain meds, they were ambulating without difficulty, and they had normal bowel function.   Consults: None  Significant Diagnostic Studies: none  Treatments: surgery: Robotic nephrectomy  Discharge Exam: Blood pressure 112/74, pulse (!) 106, temperature 98.3 F (36.8 C), temperature source Oral, resp. rate 18, height 5\' 10"  (1.778 m), weight 80.7 kg (178 lb), SpO2 95 %. General appearance: alert, cooperative and appears stated age Head: Normocephalic, without obvious abnormality, atraumatic Nose: Nares normal. Septum midline. Mucosa normal. No drainage or sinus tenderness. Resp: clear to auscultation bilaterally Cardio: regular rate and rhythm, S1, S2 normal, no murmur, click, rub or gallop GI: soft, non-tender; bowel sounds normal; no masses,  no organomegaly Extremities: extremities normal, atraumatic, no cyanosis or edema Neurologic: Grossly normal  Disposition: 01-Home or Self Care     Medication List    STOP taking these medications   Vitamin D-3 5000 UNITS Tabs     TAKE these medications   HYDROcodone-acetaminophen 5-325 MG tablet Commonly known as:  NORCO Take 1-2 tablets by mouth every 6 (six) hours as needed for moderate pain or severe pain.   lisinopril 10 MG tablet Commonly known as:   PRINIVIL,ZESTRIL TAKE 1 TABLET (10 MG TOTAL) BY MOUTH DAILY. NEEDS APPT FOR FURTHER REFILLS   loratadine 10 MG tablet Commonly known as:  CLARITIN Take 10 mg by mouth daily as needed for allergies.      Follow-up Information    Alexis Frock, MD On 05/08/2016.   Specialty:  Urology Why:  at 11:45 for MD visit. Dr. Tresa Moore will call you with pathology results when available.  Contact information: [address] [address] [phone]           Signed: Nicolette Bang 05/03/2016, 11:23 AM

## 2016-05-08 DIAGNOSIS — R310 Gross hematuria: Secondary | ICD-10-CM | POA: Diagnosis not present

## 2016-05-23 ENCOUNTER — Encounter: Payer: Self-pay | Admitting: Internal Medicine

## 2016-05-23 ENCOUNTER — Ambulatory Visit (INDEPENDENT_AMBULATORY_CARE_PROVIDER_SITE_OTHER): Payer: BLUE CROSS/BLUE SHIELD | Admitting: Internal Medicine

## 2016-05-23 VITALS — BP 130/88 | HR 57 | Ht 69.0 in | Wt 178.0 lb

## 2016-05-23 DIAGNOSIS — N2889 Other specified disorders of kidney and ureter: Secondary | ICD-10-CM | POA: Diagnosis not present

## 2016-05-23 DIAGNOSIS — E785 Hyperlipidemia, unspecified: Secondary | ICD-10-CM

## 2016-05-23 DIAGNOSIS — Z125 Encounter for screening for malignant neoplasm of prostate: Secondary | ICD-10-CM | POA: Diagnosis not present

## 2016-05-23 DIAGNOSIS — I2510 Atherosclerotic heart disease of native coronary artery without angina pectoris: Secondary | ICD-10-CM

## 2016-05-23 DIAGNOSIS — I2583 Coronary atherosclerosis due to lipid rich plaque: Secondary | ICD-10-CM

## 2016-05-23 DIAGNOSIS — Z0000 Encounter for general adult medical examination without abnormal findings: Secondary | ICD-10-CM | POA: Diagnosis not present

## 2016-05-23 DIAGNOSIS — I10 Essential (primary) hypertension: Secondary | ICD-10-CM | POA: Diagnosis not present

## 2016-05-23 DIAGNOSIS — I2584 Coronary atherosclerosis due to calcified coronary lesion: Secondary | ICD-10-CM

## 2016-05-23 DIAGNOSIS — E538 Deficiency of other specified B group vitamins: Secondary | ICD-10-CM | POA: Diagnosis not present

## 2016-05-23 DIAGNOSIS — R6882 Decreased libido: Secondary | ICD-10-CM | POA: Diagnosis not present

## 2016-05-23 MED ORDER — "INSULIN SYRINGE/NEEDLE 28G X 1/2"" 1 ML MISC": 2 refills | Status: DC

## 2016-05-23 MED ORDER — CYANOCOBALAMIN 1000 MCG/ML IJ SOLN: INTRAMUSCULAR | 2 refills | Status: DC

## 2016-05-23 MED ORDER — CYANOCOBALAMIN 1000 MCG/ML IJ SOLN
1000.0000 ug | Freq: Once | INTRAMUSCULAR | Status: AC
  Administered 2016-05-23: 1000 ug via INTRAMUSCULAR

## 2016-05-23 NOTE — Patient Instructions (Addendum)
Continue 10 mg lisinopril  for now.  Goal is  120/70 or less  Red Yeast Rice 600 mg twice daily to lower cholesterol  Start taking 1 mg folic acid daily   123456 injections  1000 mcg IM injection weekly x 4 weeks,  Then monthly thereafter (simplified schedule)  Return in 6 weeks for fasting labs.  Health Maintenance, Male A healthy lifestyle and preventative care can promote health and wellness.  Maintain regular health, dental, and eye exams.  Eat a healthy diet. Foods like vegetables, fruits, whole grains, low-fat dairy products, and lean protein foods contain the nutrients you need and are low in calories. Decrease your intake of foods high in solid fats, added sugars, and salt. Get information about a proper diet from your health care provider, if necessary.  Regular physical exercise is one of the most important things you can do for your health. Most adults should get at least 150 minutes of moderate-intensity exercise (any activity that increases your heart rate and causes you to sweat) each week. In addition, most adults need muscle-strengthening exercises on 2 or more days a week.   Maintain a healthy weight. The body mass index (BMI) is a screening tool to identify possible weight problems. It provides an estimate of body fat based on height and weight. Your health care provider can find your BMI and can help you achieve or maintain a healthy weight. For males 20 years and older:  A BMI below 18.5 is considered underweight.  A BMI of 18.5 to 24.9 is normal.  A BMI of 25 to 29.9 is considered overweight.  A BMI of 30 and above is considered obese.  Maintain normal blood lipids and cholesterol by exercising and minimizing your intake of saturated fat. Eat a balanced diet with plenty of fruits and vegetables. Blood tests for lipids and cholesterol should begin at age 65 and be repeated every 5 years. If your lipid or cholesterol levels are high, you are over age 20, or you are at  high risk for heart disease, you may need your cholesterol levels checked more frequently.Ongoing high lipid and cholesterol levels should be treated with medicines if diet and exercise are not working.  If you smoke, find out from your health care provider how to quit. If you do not use tobacco, do not start.  Lung cancer screening is recommended for adults aged 77-80 years who are at high risk for developing lung cancer because of a history of smoking. A yearly low-dose CT scan of the lungs is recommended for people who have at least a 30-pack-year history of smoking and are current smokers or have quit within the past 15 years. A pack year of smoking is smoking an average of 1 pack of cigarettes a day for 1 year (for example, a 30-pack-year history of smoking could mean smoking 1 pack a day for 30 years or 2 packs a day for 15 years). Yearly screening should continue until the smoker has stopped smoking for at least 15 years. Yearly screening should be stopped for people who develop a health problem that would prevent them from having lung cancer treatment.  If you choose to drink alcohol, do not have more than 2 drinks per day. One drink is considered to be 12 oz (360 mL) of beer, 5 oz (150 mL) of wine, or 1.5 oz (45 mL) of liquor.  Avoid the use of street drugs. Do not share needles with anyone. Ask for help if you need  support or instructions about stopping the use of drugs.  High blood pressure causes heart disease and increases the risk of stroke. High blood pressure is more likely to develop in:  People who have blood pressure in the end of the normal range (100-139/85-89 mm Hg).  People who are overweight or obese.  People who are African American.  If you are 76-7 years of age, have your blood pressure checked every 3-5 years. If you are 25 years of age or older, have your blood pressure checked every year. You should have your blood pressure measured twice--once when you are at a  hospital or clinic, and once when you are not at a hospital or clinic. Record the average of the two measurements. To check your blood pressure when you are not at a hospital or clinic, you can use:  An automated blood pressure machine at a pharmacy.  A home blood pressure monitor.  If you are 7-46 years old, ask your health care provider if you should take aspirin to prevent heart disease.  Diabetes screening involves taking a blood sample to check your fasting blood sugar level. This should be done once every 3 years after age 74 if you are at a normal weight and without risk factors for diabetes. Testing should be considered at a younger age or be carried out more frequently if you are overweight and have at least 1 risk factor for diabetes.  Colorectal cancer can be detected and often prevented. Most routine colorectal cancer screening begins at the age of 31 and continues through age 51. However, your health care provider may recommend screening at an earlier age if you have risk factors for colon cancer. On a yearly basis, your health care provider may provide home test kits to check for hidden blood in the stool. A small camera at the end of a tube may be used to directly examine the colon (sigmoidoscopy or colonoscopy) to detect the earliest forms of colorectal cancer. Talk to your health care provider about this at age 28 when routine screening begins. A direct exam of the colon should be repeated every 5-10 years through age 45, unless early forms of precancerous polyps or small growths are found.  People who are at an increased risk for hepatitis B should be screened for this virus. You are considered at high risk for hepatitis B if:  You were born in a country where hepatitis B occurs often. Talk with your health care provider about which countries are considered high risk.  Your parents were born in a high-risk country and you have not received a shot to protect against hepatitis B  (hepatitis B vaccine).  You have HIV or AIDS.  You use needles to inject street drugs.  You live with, or have sex with, someone who has hepatitis B.  You are a man who has sex with other men (MSM).  You get hemodialysis treatment.  You take certain medicines for conditions like cancer, organ transplantation, and autoimmune conditions.  Hepatitis C blood testing is recommended for all people born from [birth year] through 1965 and any individual with known risk factors for hepatitis C.  Healthy men should no longer receive prostate-specific antigen (PSA) blood tests as part of routine cancer screening. Talk to your health care provider about prostate cancer screening.  Testicular cancer screening is not recommended for adolescents or adult males who have no symptoms. Screening includes self-exam, a health care provider exam, and other screening tests. Consult with your  health care provider about any symptoms you have or any concerns you have about testicular cancer.  Practice safe sex. Use condoms and avoid high-risk sexual practices to reduce the spread of sexually transmitted infections (STIs).  You should be screened for STIs, including gonorrhea and chlamydia if:  You are sexually active and are younger than 24 years.  You are older than 24 years, and your health care provider tells you that you are at risk for this type of infection.  Your sexual activity has changed since you were last screened, and you are at an increased risk for chlamydia or gonorrhea. Ask your health care provider if you are at risk.  If you are at risk of being infected with HIV, it is recommended that you take a prescription medicine daily to prevent HIV infection. This is called pre-exposure prophylaxis (PrEP). You are considered at risk if:  You are a man who has sex with other men (MSM).  You are a heterosexual man who is sexually active with multiple partners.  You take drugs by injection.  You are  sexually active with a partner who has HIV.  Talk with your health care provider about whether you are at high risk of being infected with HIV. If you choose to begin PrEP, you should first be tested for HIV. You should then be tested every 3 months for as long as you are taking PrEP.  Use sunscreen. Apply sunscreen liberally and repeatedly throughout the day. You should seek shade when your shadow is shorter than you. Protect yourself by wearing long sleeves, pants, a wide-brimmed hat, and sunglasses year round whenever you are outdoors.  Tell your health care provider of new moles or changes in moles, especially if there is a change in shape or color. Also, tell your health care provider if a mole is larger than the size of a pencil eraser.  A one-time screening for abdominal aortic aneurysm (AAA) and surgical repair of large AAAs by ultrasound is recommended for men aged 56-75 years who are current or former smokers.  Stay current with your vaccines (immunizations).   This information is not intended to replace advice given to you by your health care provider. Make sure you discuss any questions you have with your health care provider.   Document Released: 12/29/2007 Document Revised: 07/23/2014 Document Reviewed: 11/27/2010 Elsevier Interactive Patient Education [employer].

## 2016-05-23 NOTE — Progress Notes (Signed)
Patient ID: Daniel Lawrence, male    DOB: 12-20-1960  Age: 55 y.o. MRN: [id number]  The patient is here for annual \\CPE  and management of other chronic and acute problems.  Last seen May 2016. Colonoscopy 2012  PSA Sept 2017 b outside physician     The risk factors are reflected in the social history.  The roster of all physicians providing medical care to patient - is listed in the Snapshot section of the chart.  Home safety : The patient has smoke detectors in the home. They wear seatbelts.  There are no firearms at home. There is no violence in the home.   There is no risks for hepatitis, STDs or HIV. There is no   history of blood transfusion. They have no travel history to infectious disease endemic areas of the world.  The patient has seen their dentist in the last six month. They have seen their eye doctor in the last year.    Discussed the need for sun protection: hats, long sleeves and use of sunscreen if there is significant sun exposure given his history of squamous cell CA . Marland Kitchen   Diet: the importance of a healthy diet is discussed. They do have a healthy diet.  The benefits of regular aerobic exercise were discussed. He has resumed yoga and walking since his surgeyry.   Depression screen: there are no signs or vegative symptoms of depression- irritability, change in appetite, anhedonia, sadness/tearfullness.  The following portions of the patient's history were reviewed and updated as appropriate: allergies, current medications, past family history, past medical history,  past surgical history, past social history  and problem list.  Visual acuity was not assessed per patient preference since she has regular follow up with her ophthalmologist. Hearing and body mass index were assessed and reviewed.   During the course of the visit the patient was educated and counseled about appropriate screening and preventive services including : fall prevention , diabetes screening,  nutrition counseling, colorectal cancer screening, and recommended immunizations.    CC: The primary encounter diagnosis was Encounter for preventive health examination. Diagnoses of B12 deficiency, Decreased libido, Prostate cancer screening, Coronary atherosclerosis due to calcified coronary lesion, Renal mass, Essential hypertension, Hyperlipidemia with target LDL less than 100, and Coronary atherosclerosis due to lipid rich plaque were also pertinent to this visit.    Multiple issues discussed.   1) Renal mass: reviewed recent history with patient including surgery,  Path report, etc/  He Presented with gross hematuria in September  CT showed a large right renal mass .  Underwent robotic assisted radical nephrectomy Oct 6 for right nephrectomy for renal mass 6.5 cm discharged on Oct 8    2) Prostate CA screening,  Had PSA , DRE exam and  cystoscopy by Daniel Lawrence   Discussed getting DEXA   3) Hypertension :  His blood pressure   after the surgery was  elevated, so he has resumed the lisinopril 10 mg daily as of  Monday (2 days ago )   4)  B12 deficiency:  Vegetarian diet.  Needs folate level checked.   History Daniel Lawrence has a past medical history of Allergy and Hypertension.   He has a past surgical history that includes Mohs surgery (March 2013); Brain surgery; Elevation of depressed skull fracture (1971); and Robot assisted laparoscopic nephrectomy (Right, 04/20/2016).   His family history includes Alzheimer's disease in his maternal aunt, maternal uncle, and mother; Cancer in his maternal grandfather and maternal grandmother; Heart  disease in his father.He reports that he has never smoked. He has never used smokeless tobacco. He reports that he drinks about 3.6 oz of alcohol per week . He reports that he does not use drugs.  Outpatient Medications Prior to Visit  Medication Sig Dispense Refill  . lisinopril (PRINIVIL,ZESTRIL) 10 MG tablet TAKE 1 TABLET (10 MG TOTAL) BY MOUTH DAILY. NEEDS  APPT FOR FURTHER REFILLS 30 tablet 0  . loratadine (CLARITIN) 10 MG tablet Take 10 mg by mouth daily as needed for allergies.     Marland Kitchen HYDROcodone-acetaminophen (NORCO) 5-325 MG tablet Take 1-2 tablets by mouth every 6 (six) hours as needed for moderate pain or severe pain. (Patient not taking: Reported on 05/23/2016) 30 tablet 0   No facility-administered medications prior to visit.     Review of Systems   Patient denies headache, fevers, malaise, unintentional weight loss, skin rash, eye pain, sinus congestion and sinus pain, sore throat, dysphagia,  hemoptysis , cough, dyspnea, wheezing, chest pain, palpitations, orthopnea, edema, abdominal pain, nausea, melena, diarrhea, constipation, flank pain, dysuria, hematuria, urinary  Frequency, nocturia, numbness, tingling, seizures,  Focal weakness, Loss of consciousness,  Tremor, insomnia, depression, anxiety, and suicidal ideation.      Objective:  BP 130/88   Pulse (!) 57   Ht 5\' 9"  (1.753 m)   Wt 178 lb (80.7 kg)   SpO2 97%   BMI 26.29 kg/m   Physical Exam   General appearance: alert, cooperative and appears stated age Ears: right external ear occluded by cerumen , normal left TM  external ear canals both ears Throat: lips, mucosa, and tongue normal; teeth and gums normal Neck: no adenopathy, no carotid bruit, supple, symmetrical, trachea midline and thyroid not enlarged, symmetric, no tenderness/mass/nodules Back: symmetric, no curvature. ROM normal. No CVA tenderness. Lungs: clear to auscultation bilaterally Heart: regular rate and rhythm, S1, S2 normal, no murmur, click, rub or gallop Abdomen: well healed surgical scars from recent nephrectony, abd  non-tender; bowel sounds normal; no masses,  no organomegaly Pulses: 2+ and symmetric Skin: Skin color, texture, turgor normal. No rashes or lesions Lymph nodes: Cervical, supraclavicular, and axillary nodes normal.   Assessment & Plan:   Problem List Items Addressed This Visit     Hyperlipidemia with target LDL less than 100    Per patient request managing without medications. Red Yeast Rice advised,  Return in 6 weeks.    Lab Results  Component Value Date   CHOL 198 04/05/2016   HDL 42.70 04/05/2016   LDLCALC 134 (H) 04/05/2016   LDLDIRECT 154.9 12/07/2011   TRIG 106.0 04/05/2016   CHOLHDL 5 04/05/2016         Hypertension    Resumed lisinopril several days ago.  Goal 120/70  Lab Results  Component Value Date   CREATININE 1.63 (H) 04/21/2016   Lab Results  Component Value Date   NA 136 04/21/2016   K 4.6 04/21/2016   CL 102 04/21/2016   CO2 29 04/21/2016         Encounter for preventive health examination - Primary    Annual comprehensive preventive exam was done as well as an evaluation and management of chronic conditions .  During the course of the visit the patient was educated and counseled about appropriate screening and preventive services including :  diabetes screening, lipid analysis with projected  10 year  risk for CAD  using the Framingham risk calculator for men, , nutrition counseling, prostate and colorectal cancer screening, and recommended  immunizations.  Printed recommendations for health maintenance screenings was given.        B12 deficiency    Secondary to diet,  IM injections started. Advised to take 1 mg folic acid level as well, check folic acid level with 6 week follow up labs.       Relevant Medications   cyanocobalamin ((VITAMIN B-12)) injection 1,000 mcg (Completed)   Other Relevant Orders   Folate RBC   Renal mass    Reviewed surgery and path report.  Mass was considered a neoplasm with virtually no risk of metastasis       Coronary atherosclerosis due to lipid rich plaque    Noted on imaging films.   Discussed repeating cholesterol after RYR trial and starting stain therapy if 10- yr  Risk is > 7%        Other Visit Diagnoses    Decreased libido       Relevant Orders   Testosterone, Free, Total, SHBG    Prostate cancer screening       Coronary atherosclerosis due to calcified coronary lesion       Relevant Orders   Lipid panel      I have discontinued Mr. Hemming HYDROcodone-acetaminophen. I am also having him start on cyanocobalamin, INS SYRINGE/NEEDLE 1CC/28G, and INS SYRINGE/NEEDLE 1CC/28G. Additionally, I am having him maintain his loratadine and lisinopril. We administered cyanocobalamin.  Meds ordered this encounter  Medications  . cyanocobalamin (,VITAMIN B-12,) 1000 MCG/ML injection    Sig: 1000 MCG INJECT INTO MUSCLE WEEKLY X 3 WEEKS , THEN MONTHLY    Dispense:  10 mL    Refill:  2  . INS SYRINGE/NEEDLE 1CC/28G (B-D INSULIN SYRINGE 1CC/28G) 28G X 1/2" 1 ML MISC    Sig: FOR WEEKLY B12 INJECTIONS    Dispense:  10 each    Refill:  2  . INS SYRINGE/NEEDLE 1CC/28G (B-D INSULIN SYRINGE 1CC/28G) 28G X 1/2" 1 ML MISC    Sig: FOR USE WITH b12 INJECTIONS WEEKLY    Dispense:  10 each    Refill:  2  . cyanocobalamin ((VITAMIN B-12)) injection 1,000 mcg    Medications Discontinued During This Encounter  Medication Reason  . HYDROcodone-acetaminophen (NORCO) 5-325 MG tablet Completed Course    Follow-up: Return in about 6 weeks (around 07/04/2016), or FASTING LABS ,  .   Travas Schexnayder, Aris Everts, MD

## 2016-05-24 ENCOUNTER — Telehealth: Payer: Self-pay | Admitting: Internal Medicine

## 2016-05-24 NOTE — Telephone Encounter (Signed)
Yes, I will delete it so it will not be drawn next month.

## 2016-05-24 NOTE — Telephone Encounter (Signed)
Can the PSA orderedyesterday be cancelled?  Patient  had one done at outside office last month.

## 2016-05-26 DIAGNOSIS — I2510 Atherosclerotic heart disease of native coronary artery without angina pectoris: Secondary | ICD-10-CM | POA: Insufficient documentation

## 2016-05-26 DIAGNOSIS — I2583 Coronary atherosclerosis due to lipid rich plaque: Secondary | ICD-10-CM

## 2016-05-26 NOTE — Assessment & Plan Note (Addendum)
Resumed lisinopril several days ago.  Goal 120/70  Lab Results  Component Value Date   CREATININE 1.63 (H) 04/21/2016   Lab Results  Component Value Date   NA 136 04/21/2016   K 4.6 04/21/2016   CL 102 04/21/2016   CO2 29 04/21/2016

## 2016-05-26 NOTE — Assessment & Plan Note (Signed)
Reviewed surgery and path report.  Mass was considered a neoplasm with virtually no risk of metastasis

## 2016-05-26 NOTE — Assessment & Plan Note (Signed)
Annual comprehensive preventive exam was done as well as an evaluation and management of chronic conditions .  During the course of the visit the patient was educated and counseled about appropriate screening and preventive services including :  diabetes screening, lipid analysis with projected  10 year  risk for CAD  using the Framingham risk calculator for men, , nutrition counseling, prostate and colorectal cancer screening, and recommended immunizations.  Printed recommendations for health maintenance screenings was given.

## 2016-05-26 NOTE — Assessment & Plan Note (Addendum)
Secondary to diet,  IM injections started. Advised to take 1 mg folic acid level as well, check folic acid level with 6 week follow up labs.

## 2016-05-26 NOTE — Assessment & Plan Note (Signed)
Noted on imaging films.   Discussed repeating cholesterol after RYR trial and starting stain therapy if 10- yr  Risk is > 7%

## 2016-05-26 NOTE — Assessment & Plan Note (Addendum)
Per patient request managing without medications. Red Yeast Rice advised,  Return in 6 weeks.    Lab Results  Component Value Date   CHOL 198 04/05/2016   HDL 42.70 04/05/2016   LDLCALC 134 (H) 04/05/2016   LDLDIRECT 154.9 12/07/2011   TRIG 106.0 04/05/2016   CHOLHDL 5 04/05/2016

## 2016-06-11 ENCOUNTER — Other Ambulatory Visit: Payer: Self-pay | Admitting: Internal Medicine

## 2016-07-04 ENCOUNTER — Other Ambulatory Visit (INDEPENDENT_AMBULATORY_CARE_PROVIDER_SITE_OTHER): Payer: BLUE CROSS/BLUE SHIELD

## 2016-07-04 DIAGNOSIS — I2510 Atherosclerotic heart disease of native coronary artery without angina pectoris: Secondary | ICD-10-CM

## 2016-07-04 DIAGNOSIS — I2584 Coronary atherosclerosis due to calcified coronary lesion: Secondary | ICD-10-CM

## 2016-07-04 DIAGNOSIS — E538 Deficiency of other specified B group vitamins: Secondary | ICD-10-CM

## 2016-07-04 DIAGNOSIS — R6882 Decreased libido: Secondary | ICD-10-CM

## 2016-07-04 LAB — CBC WITH DIFFERENTIAL/PLATELET
BASOS PCT: 1.2 % (ref 0.0–3.0)
Basophils Absolute: 0 10*3/uL (ref 0.0–0.1)
EOS PCT: 4.3 % (ref 0.0–5.0)
Eosinophils Absolute: 0.2 10*3/uL (ref 0.0–0.7)
HCT: 46.6 % (ref 39.0–52.0)
HEMOGLOBIN: 15.8 g/dL (ref 13.0–17.0)
Lymphocytes Relative: 29.5 % (ref 12.0–46.0)
Lymphs Abs: 1.2 10*3/uL (ref 0.7–4.0)
MCHC: 34 g/dL (ref 30.0–36.0)
MCV: 87.8 fl (ref 78.0–100.0)
MONO ABS: 0.5 10*3/uL (ref 0.1–1.0)
Monocytes Relative: 11.3 % (ref 3.0–12.0)
Neutro Abs: 2.1 10*3/uL (ref 1.4–7.7)
Neutrophils Relative %: 53.7 % (ref 43.0–77.0)
Platelets: 189 10*3/uL (ref 150.0–400.0)
RBC: 5.31 Mil/uL (ref 4.22–5.81)
RDW: 13.5 % (ref 11.5–15.5)
WBC: 4 10*3/uL (ref 4.0–10.5)

## 2016-07-04 LAB — LIPID PANEL
CHOL/HDL RATIO: 5
CHOLESTEROL: 263 mg/dL — AB (ref 0–200)
HDL: 58.2 mg/dL (ref >39.00)
LDL CALC: 166 mg/dL — AB (ref 0–99)
NONHDL: 204.94
Triglycerides: 195 mg/dL — ABNORMAL HIGH (ref 0.0–149.0)
VLDL: 39 mg/dL (ref 0.0–40.0)

## 2016-07-05 LAB — FOLATE RBC: RBC FOLATE: 497 ng/mL (ref >280)

## 2016-07-07 ENCOUNTER — Encounter: Payer: Self-pay | Admitting: Internal Medicine

## 2016-07-10 ENCOUNTER — Ambulatory Visit: Payer: BLUE CROSS/BLUE SHIELD | Admitting: Internal Medicine

## 2016-07-10 LAB — TESTOS,TOTAL,FREE AND SHBG (FEMALE)
Sex Hormone Binding Glob.: 56 nmol/L (ref 22–77)
TESTOSTERONE,TOTAL,LC/MS/MS: 704 ng/dL (ref 250–1100)
Testosterone, Free: 97.2 pg/mL (ref 35.0–155.0)

## 2016-07-11 ENCOUNTER — Encounter: Payer: Self-pay | Admitting: Internal Medicine

## 2016-08-03 ENCOUNTER — Encounter: Payer: Self-pay | Admitting: Emergency Medicine

## 2016-08-03 ENCOUNTER — Ambulatory Visit
Admission: EM | Admit: 2016-08-03 | Discharge: 2016-08-03 | Disposition: A | Discharge status: Home / Self Care | Payer: BLUE CROSS/BLUE SHIELD | Attending: Family Medicine | Admitting: Family Medicine

## 2016-08-03 DIAGNOSIS — J0101 Acute recurrent maxillary sinusitis: Secondary | ICD-10-CM | POA: Diagnosis not present

## 2016-08-03 MED ORDER — FLUTICASONE PROPIONATE 50 MCG/ACT NA SUSP: 2.0000 | Freq: Every day | NASAL | 0 refills | Status: DC

## 2016-08-03 MED ORDER — AMOXICILLIN-POT CLAVULANATE 875-125 MG PO TABS: 1.0000 | ORAL_TABLET | Freq: Two times a day (BID) | ORAL | 0 refills | Status: DC

## 2016-08-03 NOTE — ED Triage Notes (Signed)
Patient c/o nasal congestion, HAs, and cough for the past 2 weeks.  Patient denies fevers.  Patient reports sinus pressure and pain that started last night.
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
## 2016-08-22 ENCOUNTER — Ambulatory Visit
Admission: RE | Admit: 2016-08-22 | Discharge: 2016-08-22 | Disposition: A | Discharge status: Home / Self Care | Payer: BLUE CROSS/BLUE SHIELD | Source: Other Acute Inpatient Hospital | Attending: Family Medicine | Admitting: Family Medicine

## 2016-08-22 ENCOUNTER — Ambulatory Visit
Admission: EM | Admit: 2016-08-22 | Discharge: 2016-08-22 | Disposition: A | Discharge status: Home / Self Care | Payer: BLUE CROSS/BLUE SHIELD | Attending: Family Medicine | Admitting: Family Medicine

## 2016-08-22 ENCOUNTER — Encounter: Payer: Self-pay | Admitting: *Deleted

## 2016-08-22 ENCOUNTER — Ambulatory Visit
Admission: RE | Admit: 2016-08-22 | Discharge: 2016-08-22 | Disposition: A | Discharge status: Home / Self Care | Payer: BLUE CROSS/BLUE SHIELD | Source: Ambulatory Visit | Attending: Family Medicine | Admitting: Family Medicine

## 2016-08-22 DIAGNOSIS — M79652 Pain in left thigh: Secondary | ICD-10-CM | POA: Insufficient documentation

## 2016-08-22 DIAGNOSIS — M79605 Pain in left leg: Secondary | ICD-10-CM

## 2016-08-22 DIAGNOSIS — M79662 Pain in left lower leg: Secondary | ICD-10-CM | POA: Diagnosis not present

## 2016-08-22 DIAGNOSIS — M25562 Pain in left knee: Secondary | ICD-10-CM | POA: Insufficient documentation

## 2016-08-22 NOTE — ED Triage Notes (Signed)
Patient started having left leg and knee pain 2 days ago. No previous history of left leg and knee pain.
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
## 2016-08-22 NOTE — Discharge Instructions (Signed)
Lower extremity doppler ultrasound to rule out DVT If negative then treat as sprain/strain with rest, anti-inflammatories

## 2016-08-22 NOTE — ED Notes (Signed)
Col[place] in Ultrasound notified that our patient will need a STAT US doppler.

## 2016-08-30 ENCOUNTER — Encounter: Payer: Self-pay | Admitting: Internal Medicine

## 2016-08-30 DIAGNOSIS — M25462 Effusion, left knee: Secondary | ICD-10-CM

## 2016-08-30 DIAGNOSIS — M25562 Pain in left knee: Principal | ICD-10-CM

## 2016-08-30 NOTE — Telephone Encounter (Signed)
Daniel Lawrence,  Can you get this patient on ultrasound done on Friday? Given [facility]'s recent failure to detect Kathy's husband's large DVT,  I would like  A second opinion,  I put it in as an urgent referral to [facility] but if I need to do it differently let me know

## 2016-08-31 ENCOUNTER — Other Ambulatory Visit: Payer: Self-pay | Admitting: Internal Medicine

## 2016-08-31 ENCOUNTER — Other Ambulatory Visit (INDEPENDENT_AMBULATORY_CARE_PROVIDER_SITE_OTHER): Payer: BLUE CROSS/BLUE SHIELD

## 2016-08-31 DIAGNOSIS — M25462 Effusion, left knee: Secondary | ICD-10-CM

## 2016-08-31 DIAGNOSIS — M25562 Pain in left knee: Secondary | ICD-10-CM

## 2016-09-07 ENCOUNTER — Telehealth: Payer: Self-pay | Admitting: Internal Medicine

## 2016-09-07 NOTE — Telephone Encounter (Signed)
Pt came in stating that he is in extreme pain. He states that he is taking medication from a previous surgery to help easy pain. Pt states he wanted to know when he'd hear about an appointment from [facility] office. If nothing soon he would be okay with referral to somewhere in [place].

## 2016-09-14 DIAGNOSIS — M25562 Pain in left knee: Secondary | ICD-10-CM | POA: Diagnosis not present

## 2016-09-14 DIAGNOSIS — M1712 Unilateral primary osteoarthritis, left knee: Secondary | ICD-10-CM | POA: Diagnosis not present

## 2016-09-21 DIAGNOSIS — L821 Other seborrheic keratosis: Secondary | ICD-10-CM | POA: Diagnosis not present

## 2016-09-21 DIAGNOSIS — D224 Melanocytic nevi of scalp and neck: Secondary | ICD-10-CM | POA: Diagnosis not present

## 2016-09-21 DIAGNOSIS — D2271 Melanocytic nevi of right lower limb, including hip: Secondary | ICD-10-CM | POA: Diagnosis not present

## 2016-09-21 DIAGNOSIS — D2272 Melanocytic nevi of left lower limb, including hip: Secondary | ICD-10-CM | POA: Diagnosis not present

## 2016-12-19 ENCOUNTER — Other Ambulatory Visit: Payer: Self-pay | Admitting: Internal Medicine

## 2017-04-12 DIAGNOSIS — R310 Gross hematuria: Secondary | ICD-10-CM | POA: Diagnosis not present

## 2017-04-19 DIAGNOSIS — Z85528 Personal history of other malignant neoplasm of kidney: Secondary | ICD-10-CM | POA: Diagnosis not present

## 2017-04-19 DIAGNOSIS — C641 Malignant neoplasm of right kidney, except renal pelvis: Secondary | ICD-10-CM | POA: Diagnosis not present

## 2017-05-02 DIAGNOSIS — C641 Malignant neoplasm of right kidney, except renal pelvis: Secondary | ICD-10-CM | POA: Diagnosis not present

## 2017-05-02 DIAGNOSIS — Z905 Acquired absence of kidney: Secondary | ICD-10-CM | POA: Diagnosis not present

## 2017-05-02 DIAGNOSIS — R310 Gross hematuria: Secondary | ICD-10-CM | POA: Diagnosis not present

## 2017-05-02 DIAGNOSIS — R911 Solitary pulmonary nodule: Secondary | ICD-10-CM | POA: Diagnosis not present

## 2017-06-28 ENCOUNTER — Other Ambulatory Visit: Payer: Self-pay | Admitting: Internal Medicine

## 2017-06-28 NOTE — Telephone Encounter (Signed)
Refilled: 12/19/2016 Last OV: 05/23/2016 Next OV: not scheduled

## 2017-07-04 ENCOUNTER — Telehealth: Payer: Self-pay | Admitting: Internal Medicine

## 2017-07-04 DIAGNOSIS — I10 Essential (primary) hypertension: Secondary | ICD-10-CM | POA: Diagnosis not present

## 2017-07-04 DIAGNOSIS — N183 Chronic kidney disease, stage 3 (moderate): Secondary | ICD-10-CM | POA: Diagnosis not present

## 2017-07-04 DIAGNOSIS — E538 Deficiency of other specified B group vitamins: Secondary | ICD-10-CM

## 2017-07-04 NOTE — Telephone Encounter (Signed)
Copied from [place]. Topic: Quick Communication - Rx Refill/Question >> Jul 04, 2017  4:46 PM Marin Olp L wrote: Has the patient contacted their pharmacy? Yes.   (Agent: If no, request that the patient contact the pharmacy for the refill.) Preferred Pharmacy (with phone number or street name): [id number] - [place], [place]: Please be advised that RX refills may take up to 3 business days. We ask that you follow-up with your pharmacy. Refill lisinopril (PRINIVIL,ZESTRIL) 10 MG (prefer 90 day supply)

## 2017-07-05 MED ORDER — LISINOPRIL 10 MG PO TABS: ORAL_TABLET | ORAL | 1 refills | Status: DC

## 2017-07-05 NOTE — Telephone Encounter (Signed)
Please advise. Patient scheduled appt for 08/09/17. Can med be refilled until appt?

## 2017-07-05 NOTE — Telephone Encounter (Signed)
Pt scheduled an appt for Christmas Eve. Pt is wanting to go ahead and get all his lab work done for his physical coming up in January. Pt would also like to have his b12 level checked.

## 2017-07-05 NOTE — Telephone Encounter (Signed)
ordered

## 2017-07-05 NOTE — Telephone Encounter (Signed)
Refill request for lisinopril / No recent OV / Medication denied previously /Medication Detail    Disp Refills Start End   lisinopril (PRINIVIL,ZESTRIL) 10 MG tablet [Pharmacy Med Name: LISINOPRIL 10 MG TABLET] 30 tablet 5 06/28/2017    Refill refused: Patient needs an appointment   Sig: TAKE 1 TABLET (10 MG TOTAL) BY MOUTH DAILY. NEEDS APPT FOR FURTHER REFILLS    / Patient does have an appointment on 08/09/17 with Dr. Derrel Nip  /

## 2017-07-05 NOTE — Telephone Encounter (Signed)
Is it okay to refill until his appt in January?

## 2017-07-05 NOTE — Telephone Encounter (Signed)
Has not had renal function done,  Not until he has a bmet

## 2017-07-08 ENCOUNTER — Encounter: Payer: Self-pay | Admitting: Internal Medicine

## 2017-07-08 ENCOUNTER — Other Ambulatory Visit (INDEPENDENT_AMBULATORY_CARE_PROVIDER_SITE_OTHER): Payer: BLUE CROSS/BLUE SHIELD

## 2017-07-08 DIAGNOSIS — I10 Essential (primary) hypertension: Secondary | ICD-10-CM

## 2017-07-08 DIAGNOSIS — E538 Deficiency of other specified B group vitamins: Secondary | ICD-10-CM | POA: Diagnosis not present

## 2017-07-08 LAB — COMPREHENSIVE METABOLIC PANEL
ALT: 17 U/L (ref 0–53)
AST: 17 U/L (ref 0–37)
Albumin: 4.2 g/dL (ref 3.5–5.2)
Alkaline Phosphatase: 47 U/L (ref 39–117)
BUN: 22 mg/dL (ref 6–23)
CO2: 29 mEq/L (ref 19–32)
Calcium: 9.1 mg/dL (ref 8.4–10.5)
Chloride: 102 mEq/L (ref 96–112)
Creatinine, Ser: 1.44 mg/dL (ref 0.40–1.50)
GFR: 53.5 mL/min — ABNORMAL LOW (ref >60.00)
Glucose, Bld: 88 mg/dL (ref 70–99)
Potassium: 4.7 mEq/L (ref 3.5–5.1)
Sodium: 138 mEq/L (ref 135–145)
Total Bilirubin: 0.9 mg/dL (ref 0.2–1.2)
Total Protein: 7.1 g/dL (ref 6.0–8.3)

## 2017-07-08 LAB — LIPID PANEL
Cholesterol: 230 mg/dL — ABNORMAL HIGH (ref 0–200)
HDL: 47.4 mg/dL (ref >39.00)
LDL Cholesterol: 147 mg/dL — ABNORMAL HIGH (ref 0–99)
NonHDL: 182.51
Total CHOL/HDL Ratio: 5
Triglycerides: 177 mg/dL — ABNORMAL HIGH (ref 0.0–149.0)
VLDL: 35.4 mg/dL (ref 0.0–40.0)

## 2017-07-08 LAB — VITAMIN B12: Vitamin B-12: 1020 pg/mL — ABNORMAL HIGH (ref 211–911)

## 2017-07-11 ENCOUNTER — Encounter: Payer: Self-pay | Admitting: Internal Medicine

## 2017-07-11 LAB — INTRINSIC FACTOR ANTIBODIES: INTRINSIC FACTOR: NEGATIVE

## 2017-08-09 ENCOUNTER — Ambulatory Visit (INDEPENDENT_AMBULATORY_CARE_PROVIDER_SITE_OTHER): Payer: BLUE CROSS/BLUE SHIELD | Admitting: Internal Medicine

## 2017-08-09 ENCOUNTER — Encounter: Payer: Self-pay | Admitting: Internal Medicine

## 2017-08-09 VITALS — BP 122/76 | HR 61 | Temp 98.1°F | Resp 15 | Ht 69.0 in | Wt 199.6 lb

## 2017-08-09 DIAGNOSIS — Z85528 Personal history of other malignant neoplasm of kidney: Secondary | ICD-10-CM

## 2017-08-09 DIAGNOSIS — Z0000 Encounter for general adult medical examination without abnormal findings: Secondary | ICD-10-CM

## 2017-08-09 DIAGNOSIS — H6120 Impacted cerumen, unspecified ear: Secondary | ICD-10-CM | POA: Insufficient documentation

## 2017-08-09 DIAGNOSIS — E785 Hyperlipidemia, unspecified: Secondary | ICD-10-CM | POA: Diagnosis not present

## 2017-08-09 DIAGNOSIS — H6121 Impacted cerumen, right ear: Secondary | ICD-10-CM | POA: Diagnosis not present

## 2017-08-09 DIAGNOSIS — I2510 Atherosclerotic heart disease of native coronary artery without angina pectoris: Secondary | ICD-10-CM | POA: Diagnosis not present

## 2017-08-09 DIAGNOSIS — I2583 Coronary atherosclerosis due to lipid rich plaque: Secondary | ICD-10-CM

## 2017-08-09 DIAGNOSIS — Z79899 Other long term (current) drug therapy: Secondary | ICD-10-CM

## 2017-08-09 DIAGNOSIS — I10 Essential (primary) hypertension: Secondary | ICD-10-CM

## 2017-08-09 MED ORDER — ROSUVASTATIN CALCIUM 5 MG PO TABS: 5.0000 mg | ORAL_TABLET | Freq: Every day | ORAL | 3 refills | Status: DC

## 2017-08-09 MED ORDER — LISINOPRIL 10 MG PO TABS: ORAL_TABLET | ORAL | 1 refills | Status: DC

## 2017-08-09 NOTE — Patient Instructions (Signed)
Based on your fasting cholesterol and your concurrent history of hypertension ,  your 10 year risk of having some type of coronary event (including heart attack) is 18% , meaning that one of  every 5 men with the same medical statistics will have a heart attack in the next 10 years.     The [employer] recommends starting patients on moderate intensity statin therapy for people with this risk  to lower your risks of these events.  We discussed starting generic Crestor, and I have sent the rx to your pharmacy  Take it once daily at bedtime (preferable by most patients0  return in 3 weeks for liver enzyme elevation   Ok to use turmeric and/or fish oil  For joint pain.  Bleeding risk is low and only an issue pre operatively  You have an earwax impaction on the right.  Janett Billow will schedule you a return visit for irritation    Health Maintenance, Male A healthy lifestyle and preventive care is important for your health and wellness. Ask your health care provider about what schedule of regular examinations is right for you. What should I know about weight and diet? Eat a Healthy Diet  Eat plenty of vegetables, fruits, whole grains, low-fat dairy products, and lean protein.  Do not eat a lot of foods high in solid fats, added sugars, or salt.  Maintain a Healthy Weight Regular exercise can help you achieve or maintain a healthy weight. You should:  Do at least 150 minutes of exercise each week. The exercise should increase your heart rate and make you sweat (moderate-intensity exercise).  Do strength-training exercises at least twice a week.  Watch Your Levels of Cholesterol and Blood Lipids  Have your blood tested for lipids and cholesterol every 5 years starting at 59 years of age. If you are at high risk for heart disease, you should start having your blood tested when you are 59 years old. You may need to have your cholesterol levels checked more often if: ? Your  lipid or cholesterol levels are high. ? You are older than 59 years of age. ? You are at high risk for heart disease.  What should I know about cancer screening? Many types of cancers can be detected early and may often be prevented. Lung Cancer  You should be screened every year for lung cancer if: ? You are a current smoker who has smoked for at least 30 years. ? You are a former smoker who has quit within the past 15 years.  Talk to your health care provider about your screening options, when you should start screening, and how often you should be screened.  Colorectal Cancer  Routine colorectal cancer screening usually begins at 59 years of age and should be repeated every 5-10 years until you are 59 years old. You may need to be screened more often if early forms of precancerous polyps or small growths are found. Your health care provider may recommend screening at an earlier age if you have risk factors for colon cancer.  Your health care provider may recommend using home test kits to check for hidden blood in the stool.  A small camera at the end of a tube can be used to examine your colon (sigmoidoscopy or colonoscopy). This checks for the earliest forms of colorectal cancer.  Prostate and Testicular Cancer  Depending on your age and overall health, your health care provider may do certain tests to screen for prostate and  testicular cancer.  Talk to your health care provider about any symptoms or concerns you have about testicular or prostate cancer.  Skin Cancer  Check your skin from head to toe regularly.  Tell your health care provider about any new moles or changes in moles, especially if: ? There is a change in a mole's size, shape, or color. ? You have a mole that is larger than a pencil eraser.  Always use sunscreen. Apply sunscreen liberally and repeat throughout the day.  Protect yourself by wearing long sleeves, pants, a wide-brimmed hat, and sunglasses when  outside.  What should I know about heart disease, diabetes, and high blood pressure?  If you are 61-79 years of age, have your blood pressure checked every 3-5 years. If you are 90 years of age or older, have your blood pressure checked every year. You should have your blood pressure measured twice-once when you are at a hospital or clinic, and once when you are not at a hospital or clinic. Record the average of the two measurements. To check your blood pressure when you are not at a hospital or clinic, you can use: ? An automated blood pressure machine at a pharmacy. ? A home blood pressure monitor.  Talk to your health care provider about your target blood pressure.  If you are between 48-65 years old, ask your health care provider if you should take aspirin to prevent heart disease.  Have regular diabetes screenings by checking your fasting blood sugar level. ? If you are at a normal weight and have a low risk for diabetes, have this test once every three years after the age of 24. ? If you are overweight and have a high risk for diabetes, consider being tested at a younger age or more often.  A one-time screening for abdominal aortic aneurysm (AAA) by ultrasound is recommended for men aged 62-75 years who are current or former smokers. What should I know about preventing infection? Hepatitis B If you have a higher risk for hepatitis B, you should be screened for this virus. Talk with your health care provider to find out if you are at risk for hepatitis B infection. Hepatitis C Blood testing is recommended for:  Everyone born from [birth year] through 1967.  Anyone with known risk factors for hepatitis C.  Sexually Transmitted Diseases (STDs)  You should be screened each year for STDs including gonorrhea and chlamydia if: ? You are sexually active and are younger than 59 years of age. ? You are older than 59 years of age and your health care provider tells you that you are at risk for this  type of infection. ? Your sexual activity has changed since you were last screened and you are at an increased risk for chlamydia or gonorrhea. Ask your health care provider if you are at risk.  Talk with your health care provider about whether you are at high risk of being infected with HIV. Your health care provider may recommend a prescription medicine to help prevent HIV infection.  What else can I do?  Schedule regular health, dental, and eye exams.  Stay current with your vaccines (immunizations).  Do not use any tobacco products, such as cigarettes, chewing tobacco, and e-cigarettes. If you need help quitting, ask your health care provider.  Limit alcohol intake to no more than 2 drinks per day. One drink equals 12 ounces of beer, 5 ounces of wine, or 1 ounces of hard liquor.  Do not use street  drugs.  Do not share needles.  Ask your health care provider for help if you need support or information about quitting drugs.  Tell your health care provider if you often feel depressed.  Tell your health care provider if you have ever been abused or do not feel safe at home. This information is not intended to replace advice given to you by your health care provider. Make sure you discuss any questions you have with your health care provider. Document Released: 12/29/2007 Document Revised: 02/29/2016 Document Reviewed: 04/05/2015 Elsevier Interactive Patient Education  [employer].

## 2017-08-09 NOTE — Assessment & Plan Note (Addendum)
right ear only.  referring to Nurse for irrigation

## 2017-08-09 NOTE — Progress Notes (Signed)
Patient ID: Daniel Lawrence, male    DOB: 1961-06-23  Age: 57 y.o. MRN: [id number]  The patient is here for annualwellness examination and management of other chronic and acute problems.  Last seen Nov 2017 for same    The risk factors are reflected in the social history.  The roster of all physicians providing medical care to patient - is listed in the Snapshot section of the chart.  Activities of daily living:  The patient is 100% independent in all ADLs: dressing, toileting, feeding as well as independent mobility  Home safety : The patient has smoke detectors in the home. They wear seatbelts.  There are no firearms at home. There is no violence in the home.   There is no risks for hepatitis, STDs or HIV. There is no   history of blood transfusion. They have no travel history to infectious disease endemic areas of the world.  The patient has seen their dentist in the last six month. They have seen their eye doctor in the last year. They admit to slight hearing difficulty with regard to whispered voices and some television programs.  They have deferred audiologic testing in the last year.  They do not  have excessive sun exposure. Discussed the need for sun protection: hats, long sleeves and use of sunscreen if there is significant sun exposure.   Diet: the importance of a healthy diet is discussed. They do have a healthy diet  FRC risk 18% .  Statin therapy agreed to . Discussed resuming turmeric and fish oil for joint pain,  Not  taking due to fear of bleeding.  worried about having  3 nucleated red blood cells on a CBC done at [facility] in Sept 2018  Urology did a CT scan   Derm once yearly in march        The benefits of regular aerobic exercise were discussed. She walks 4 times per week ,  20 minutes.   Depression screen: there are no signs or vegative symptoms of depression- irritability, change in appetite, anhedonia, sadness/tearfullness.  Cognitive assessment: the  patient manages all their financial and personal affairs and is actively engaged. They could relate day,date,year and events; recalled 2/3 objects at 3 minutes; performed clock-face test normally.  The following portions of the patient's history were reviewed and updated as appropriate: allergies, current medications, past family history, past medical history,  past surgical history, past social history  and problem list.  Visual acuity was not assessed per patient preference since she has regular follow up with her ophthalmologist. Hearing and body mass index were assessed and reviewed.   During the course of the visit the patient was educated and counseled about appropriate screening and preventive services including : fall prevention , diabetes screening, nutrition counseling, colorectal cancer screening, and recommended immunizations.    CC: The primary encounter diagnosis was Encounter for preventive health examination. Diagnoses of Impacted cerumen of right ear, Long-term use of high-risk medication, Hyperlipidemia with target LDL less than 100, History of renal cell carcinoma, Essential hypertension, and Coronary atherosclerosis due to lipid rich plaque were also pertinent to this visit.  Hyperlipidemia  Left knee pain ,  improed with yoga,  No sharp pain. Saw Orthopedics in March  left knee OA diagnosed.    Renal carcinoma right kidney Oct 2017.  Follow up with [facility] oct 2018 Ct scan repeated.   neprhology Dec 2018 for cr 1.1  reasssurance provided and advised to  continue lisinopril  Weight gain of 18 lbs in the past year , schedule has been hectic   History [name] has a past medical history of Allergy and Hypertension.   He has a past surgical history that includes Mohs surgery (March 2013); Brain surgery; Elevation of depressed skull fracture (1971); and Robot assisted laparoscopic nephrectomy (Right, 04/20/2016).   His family history includes Alzheimer's disease in  his maternal aunt, maternal uncle, and mother; Cancer in his maternal grandfather and maternal grandmother; Heart disease in his father.He reports that  has never smoked. he has never used smokeless tobacco. He reports that he drinks about 3.6 oz of alcohol per week. He reports that he does not use drugs.  Outpatient Medications Prior to Visit  Medication Sig Dispense Refill  . Cholecalciferol (VITAMIN D-3) 1000 units CAPS     . fluticasone (FLONASE) 50 MCG/ACT nasal spray Place 2 sprays into both nostrils daily. 16 g 0  . folic acid (FOLVITE) 595 MCG tablet     . loratadine (CLARITIN) 10 MG tablet Take 10 mg by mouth daily as needed for allergies.     Marland Kitchen lisinopril (PRINIVIL,ZESTRIL) 10 MG tablet TAKE 1 TABLET (10 MG TOTAL) BY MOUTH DAILY. NEEDS APPT FOR FURTHER REFILLS 30 tablet 1  . amoxicillin-clavulanate (AUGMENTIN) 875-125 MG tablet Take 1 tablet by mouth every 12 (twelve) hours. (Patient not taking: Reported on 08/09/2017) 20 tablet 0  . cyanocobalamin (,VITAMIN B-12,) 1000 MCG/ML injection 1000 MCG INJECT INTO MUSCLE WEEKLY X 3 WEEKS , THEN MONTHLY (Patient not taking: Reported on 08/09/2017) 10 mL 2  . INS SYRINGE/NEEDLE 1CC/28G (B-D INSULIN SYRINGE 1CC/28G) 28G X 1/2" 1 ML MISC FOR WEEKLY B12 INJECTIONS (Patient not taking: Reported on 08/09/2017) 10 each 2  . INS SYRINGE/NEEDLE 1CC/28G (B-D INSULIN SYRINGE 1CC/28G) 28G X 1/2" 1 ML MISC FOR USE WITH b12 INJECTIONS WEEKLY (Patient not taking: Reported on 08/09/2017) 10 each 2   No facility-administered medications prior to visit.     Review of Systems   Patient denies headache, fevers, malaise, unintentional weight loss, skin rash, eye pain, sinus congestion and sinus pain, sore throat, dysphagia,  hemoptysis , cough, dyspnea, wheezing, chest pain, palpitations, orthopnea, edema, abdominal pain, nausea, melena, diarrhea, constipation, flank pain, dysuria, hematuria, urinary  Frequency, nocturia, numbness, tingling, seizures,  Focal weakness,  Loss of consciousness,  Tremor, insomnia, depression, anxiety, and suicidal ideation.      Objective:  BP 122/76 (BP Location: Left Arm, Patient Position: Sitting, Cuff Size: Normal)   Pulse 61   Temp 98.1 F (36.7 C) (Oral)   Resp 15   Ht 5\' 9"  (1.753 m)   Wt 199 lb 9.6 oz (90.5 kg)   SpO2 98%   BMI 29.48 kg/m   Physical Exam  General appearance: alert, cooperative and appears stated age Ears: normal TM's and external ear canals both ears Throat: lips, mucosa, and tongue normal; teeth and gums normal Neck: no adenopathy, no carotid bruit, supple, symmetrical, trachea midline and thyroid not enlarged, symmetric, no tenderness/mass/nodules Back: symmetric, no curvature. ROM normal. No CVA tenderness. Lungs: clear to auscultation bilaterally Heart: regular rate and rhythm, S1, S2 normal, no murmur, click, rub or gallop Abdomen: soft, non-tender; bowel sounds normal; no masses,  no organomegaly Pulses: 2+ and symmetric Skin: Skin color, texture, turgor normal. No rashes or lesions Lymph nodes: Cervical, supraclavicular, and axillary nodes normal.     Assessment & Plan:   Problem List Items Addressed This Visit    Cerumen impaction    right ear  only.  referring to Nurse for irrigation       Coronary atherosclerosis due to lipid rich plaque    Asymptomatic, Noted on imaging films.   Now in agreement to start statin therapy,       Relevant Medications   rosuvastatin (CRESTOR) 5 MG tablet   lisinopril (PRINIVIL,ZESTRIL) 10 MG tablet   Encounter for preventive health examination - Primary    Annual comprehensive preventive exam was done as well as an evaluation and management of acute and chronic conditions .  During the course of the visit the patient was educated and counseled about appropriate screening and preventive services including :  diabetes screening, lipid analysis with projected  10 year  risk for CAD , nutrition counseling, prostate and colorectal cancer  screening, and recommended immunizations.  Printed recommendations for health maintenance screenings was given.       History of renal cell carcinoma    S/p nephrectomy ,  No recurrence by annual follow up  With Urology       Hyperlipidemia with target LDL less than 100    Per patient requested trial of Red Yeast Rice, which did not lower cholesterol  Agrees to statin therapy given current calculated risk of CAD to be 18% .    Lab Results  Component Value Date   CHOL 230 (H) 07/08/2017   HDL 47.40 07/08/2017   LDLCALC 147 (H) 07/08/2017   LDLDIRECT 154.9 12/07/2011   TRIG 177.0 (H) 07/08/2017   CHOLHDL 5 07/08/2017         Relevant Medications   rosuvastatin (CRESTOR) 5 MG tablet   lisinopril (PRINIVIL,ZESTRIL) 10 MG tablet   Hypertension    Well controlled on current regimen. Renal function stable, no changes today.  Lab Results  Component Value Date   CREATININE 1.44 07/08/2017   Lab Results  Component Value Date   NA 138 07/08/2017   K 4.7 07/08/2017   CL 102 07/08/2017   CO2 29 07/08/2017         Relevant Medications   rosuvastatin (CRESTOR) 5 MG tablet   lisinopril (PRINIVIL,ZESTRIL) 10 MG tablet    Other Visit Diagnoses    Long-term use of high-risk medication       Relevant Orders   Comprehensive metabolic panel      I have discontinued Domenick Gong. Guggisberg "[name]"'s cyanocobalamin, INS SYRINGE/NEEDLE 1CC/28G, INS SYRINGE/NEEDLE 1CC/28G, and amoxicillin-clavulanate. I am also having him start on rosuvastatin. Additionally, I am having him maintain his loratadine, fluticasone, Vitamin D-3, folic acid, and lisinopril.  Meds ordered this encounter  Medications  . rosuvastatin (CRESTOR) 5 MG tablet    Sig: Take 1 tablet (5 mg total) by mouth daily.    Dispense:  90 tablet    Refill:  3  . lisinopril (PRINIVIL,ZESTRIL) 10 MG tablet    Sig: TAKE 1 TABLET (10 MG TOTAL) BY MOUTH DAILY. NEEDS APPT FOR FURTHER REFILLS    Dispense:  90 tablet    Refill:  1     Medications Discontinued During This Encounter  Medication Reason  . amoxicillin-clavulanate (AUGMENTIN) 875-125 MG tablet Completed Course  . cyanocobalamin (,VITAMIN B-12,) 1000 MCG/ML injection Completed Course  . INS SYRINGE/NEEDLE 1CC/28G (B-D INSULIN SYRINGE 1CC/28G) 28G X 1/2" 1 ML MISC Completed Course  . INS SYRINGE/NEEDLE 1CC/28G (B-D INSULIN SYRINGE 1CC/28G) 28G X 1/2" 1 ML MISC Completed Course  . lisinopril (PRINIVIL,ZESTRIL) 10 MG tablet Reorder    Follow-up: Return in about 6 months (around 02/06/2018).   Helene Kelp  Ether Griffins, MD

## 2017-08-11 NOTE — Assessment & Plan Note (Signed)
Per patient requested trial of Red Yeast Rice, which did not lower cholesterol  Agrees to statin therapy given current calculated risk of CAD to be 18% .    Lab Results  Component Value Date   CHOL 230 (H) 07/08/2017   HDL 47.40 07/08/2017   LDLCALC 147 (H) 07/08/2017   LDLDIRECT 154.9 12/07/2011   TRIG 177.0 (H) 07/08/2017   CHOLHDL 5 07/08/2017

## 2017-08-11 NOTE — Assessment & Plan Note (Signed)
Well controlled on current regimen. Renal function stable, no changes today.  Lab Results  Component Value Date   CREATININE 1.44 07/08/2017   Lab Results  Component Value Date   NA 138 07/08/2017   K 4.7 07/08/2017   CL 102 07/08/2017   CO2 29 07/08/2017

## 2017-08-11 NOTE — Assessment & Plan Note (Signed)
Asymptomatic, Noted on imaging films.   Now in agreement to start statin therapy,

## 2017-08-11 NOTE — Assessment & Plan Note (Signed)
S/p nephrectomy ,  No recurrence by annual follow up  With Urology

## 2017-08-11 NOTE — Assessment & Plan Note (Signed)

## 2017-09-03 ENCOUNTER — Other Ambulatory Visit (INDEPENDENT_AMBULATORY_CARE_PROVIDER_SITE_OTHER): Payer: BLUE CROSS/BLUE SHIELD

## 2017-09-03 ENCOUNTER — Ambulatory Visit (INDEPENDENT_AMBULATORY_CARE_PROVIDER_SITE_OTHER): Payer: BLUE CROSS/BLUE SHIELD | Admitting: *Deleted

## 2017-09-03 DIAGNOSIS — Z79899 Other long term (current) drug therapy: Secondary | ICD-10-CM | POA: Diagnosis not present

## 2017-09-03 DIAGNOSIS — H6121 Impacted cerumen, right ear: Secondary | ICD-10-CM

## 2017-09-03 LAB — COMPREHENSIVE METABOLIC PANEL
ALT: 17 U/L (ref 0–53)
AST: 17 U/L (ref 0–37)
Albumin: 4.4 g/dL (ref 3.5–5.2)
Alkaline Phosphatase: 48 U/L (ref 39–117)
BILIRUBIN TOTAL: 0.9 mg/dL (ref 0.2–1.2)
BUN: 19 mg/dL (ref 6–23)
CHLORIDE: 100 meq/L (ref 96–112)
CO2: 32 meq/L (ref 19–32)
CREATININE: 1.56 mg/dL — AB (ref 0.40–1.50)
Calcium: 9.7 mg/dL (ref 8.4–10.5)
GFR: 48.75 mL/min — ABNORMAL LOW (ref >60.00)
Glucose, Bld: 90 mg/dL (ref 70–99)
Potassium: 4.6 mEq/L (ref 3.5–5.1)
Sodium: 137 mEq/L (ref 135–145)
Total Protein: 7.4 g/dL (ref 6.0–8.3)

## 2017-09-03 NOTE — Progress Notes (Signed)
I am ok with referral to ENT.  Pt of Dr Derrel Nip.

## 2017-09-03 NOTE — Progress Notes (Addendum)
Patient Presented for ear irrigation of right ear for cerumen impaction per note written by provider on 08/09/17. Irrigated ear with warm water and peroxide for approximately 5 to 10 minutes no wax was removed . Patient was ask if used Q-tips patient advised he did use them advised patient Q-Tips push wax farther into ear canal, Flushed ear for another 10 minutes was not able to remove any cerumen.  Advised patient I would forward to PCP he stated he would like to see ENT of PCP preference.  Reviewed above.  Pt of Dr Derrel Nip.  If unable to remove wax, I would recommend ENT referral.    Dr Nicki Reaper

## 2017-09-04 ENCOUNTER — Other Ambulatory Visit: Payer: Self-pay | Admitting: Internal Medicine

## 2017-09-04 DIAGNOSIS — E785 Hyperlipidemia, unspecified: Secondary | ICD-10-CM

## 2017-09-04 NOTE — Progress Notes (Signed)
  I have reviewed the above information and agree with above procedure and note .   Deborra Medina, MD

## 2017-09-05 NOTE — Progress Notes (Signed)
Patient needs referral to ENT placed would like PCP preference for referral.

## 2017-09-20 DIAGNOSIS — D225 Melanocytic nevi of trunk: Secondary | ICD-10-CM | POA: Diagnosis not present

## 2017-09-20 DIAGNOSIS — L821 Other seborrheic keratosis: Secondary | ICD-10-CM | POA: Diagnosis not present

## 2017-09-20 DIAGNOSIS — D224 Melanocytic nevi of scalp and neck: Secondary | ICD-10-CM | POA: Diagnosis not present

## 2017-09-20 DIAGNOSIS — Z85828 Personal history of other malignant neoplasm of skin: Secondary | ICD-10-CM | POA: Diagnosis not present

## 2017-10-11 DIAGNOSIS — M2392 Unspecified internal derangement of left knee: Secondary | ICD-10-CM | POA: Diagnosis not present

## 2017-10-11 DIAGNOSIS — M1712 Unilateral primary osteoarthritis, left knee: Secondary | ICD-10-CM | POA: Diagnosis not present

## 2018-02-06 ENCOUNTER — Encounter: Payer: Self-pay | Admitting: Internal Medicine

## 2018-02-06 ENCOUNTER — Ambulatory Visit (INDEPENDENT_AMBULATORY_CARE_PROVIDER_SITE_OTHER): Payer: BLUE CROSS/BLUE SHIELD | Admitting: Internal Medicine

## 2018-02-06 VITALS — BP 118/76 | HR 67 | Temp 98.2°F | Resp 15 | Ht 69.0 in | Wt 193.6 lb

## 2018-02-06 DIAGNOSIS — N183 Chronic kidney disease, stage 3 unspecified: Secondary | ICD-10-CM

## 2018-02-06 DIAGNOSIS — I10 Essential (primary) hypertension: Secondary | ICD-10-CM

## 2018-02-06 DIAGNOSIS — M25562 Pain in left knee: Secondary | ICD-10-CM | POA: Diagnosis not present

## 2018-02-06 DIAGNOSIS — E785 Hyperlipidemia, unspecified: Secondary | ICD-10-CM

## 2018-02-06 DIAGNOSIS — E875 Hyperkalemia: Secondary | ICD-10-CM

## 2018-02-06 DIAGNOSIS — H6121 Impacted cerumen, right ear: Secondary | ICD-10-CM

## 2018-02-06 DIAGNOSIS — G8929 Other chronic pain: Secondary | ICD-10-CM | POA: Diagnosis not present

## 2018-02-06 LAB — LIPID PANEL
CHOLESTEROL: 198 mg/dL (ref 0–200)
HDL: 51.1 mg/dL (ref >39.00)
LDL Cholesterol: 127 mg/dL — ABNORMAL HIGH (ref 0–99)
NonHDL: 147.16
TRIGLYCERIDES: 101 mg/dL (ref 0.0–149.0)
Total CHOL/HDL Ratio: 4
VLDL: 20.2 mg/dL (ref 0.0–40.0)

## 2018-02-06 LAB — COMPREHENSIVE METABOLIC PANEL
ALBUMIN: 4.6 g/dL (ref 3.5–5.2)
ALK PHOS: 46 U/L (ref 39–117)
ALT: 20 U/L (ref 0–53)
AST: 16 U/L (ref 0–37)
BILIRUBIN TOTAL: 0.6 mg/dL (ref 0.2–1.2)
BUN: 19 mg/dL (ref 6–23)
CALCIUM: 10 mg/dL (ref 8.4–10.5)
CO2: 30 mEq/L (ref 19–32)
CREATININE: 1.59 mg/dL — AB (ref 0.40–1.50)
Chloride: 101 mEq/L (ref 96–112)
GFR: 47.62 mL/min — ABNORMAL LOW (ref >60.00)
Glucose, Bld: 115 mg/dL — ABNORMAL HIGH (ref 70–99)
Potassium: 5.2 mEq/L — ABNORMAL HIGH (ref 3.5–5.1)
Sodium: 137 mEq/L (ref 135–145)
TOTAL PROTEIN: 7.8 g/dL (ref 6.0–8.3)

## 2018-02-06 NOTE — Patient Instructions (Addendum)
  The ShingRx vaccine is now available in local pharmacies and is much more protective thant Zostavaxs,  It is therefore ADVISED for all interested adults over 50 to prevent shingles

## 2018-02-06 NOTE — Progress Notes (Signed)
Subjective:  Patient ID: Daniel Lawrence, male    DOB: [birth year]  Age: 57 y.o. MRN: [id number]  CC: The primary encounter diagnosis was Chronic pain of left knee. Diagnoses of Impacted cerumen of right ear, Hyperlipidemia with target LDL less than 100, Essential hypertension, and CKD (chronic kidney disease) stage 3, GFR 30-59 ml/min (HCC) were also pertinent to this visit.  HPI Daniel Lawrence presents for 6 month follow up on hypertension with CKD stage 3 ,  hyperlipidemia with CAD  (seen on Sept 2017 CT of chest abd and pelvis)   Hypertension: patient checks blood pressure twice weekly at home.  Readings have been for the most part < 140/80 at rest . Patient is following a reduced salt diet most days and is taking medications as prescribed  Subpleural pulmonary nodule also seen Sept 2017.  Resolved on  1 yr follow up CT done by [facility] Oct 2018    Right cerumen impaction .  Marland Kitchen  Recurrent ENT referral .   Left knee DJD medial compartment  Diminishing returns on last (2nd) steroid injection by Daniel Lawrence in late March.     Wants to see Daniel Lawrence for 2nd opinion   Discussed goal LDL of 70 given FH and CT finding s  Outpatient Medications Prior to Visit  Medication Sig Dispense Refill  . Black Pepper-Turmeric (TURMERIC CURCUMIN) 11-998 MG CAPS     . Cholecalciferol (VITAMIN D-3) 1000 units CAPS     . fluticasone (FLONASE) 50 MCG/ACT nasal spray Place 2 sprays into both nostrils daily. 16 g 0  . folic acid (FOLVITE) 314 MCG tablet Take 800 mcg by mouth daily.     Marland Kitchen lisinopril (PRINIVIL,ZESTRIL) 10 MG tablet TAKE 1 TABLET (10 MG TOTAL) BY MOUTH DAILY. NEEDS APPT FOR FURTHER REFILLS 90 tablet 1  . loratadine (CLARITIN) 10 MG tablet Take 10 mg by mouth daily as needed for allergies.     . rosuvastatin (CRESTOR) 5 MG tablet Take 1 tablet (5 mg total) by mouth daily. 90 tablet 3   No facility-administered medications prior to visit.     Review of Systems;  Patient denies headache,  fevers, malaise, unintentional weight loss, skin rash, eye pain, sinus congestion and sinus pain, sore throat, dysphagia,  hemoptysis , cough, dyspnea, wheezing, chest pain, palpitations, orthopnea, edema, abdominal pain, nausea, melena, diarrhea, constipation, flank pain, dysuria, hematuria, urinary  Frequency, nocturia, numbness, tingling, seizures,  Focal weakness, Loss of consciousness,  Tremor, insomnia, depression, anxiety, and suicidal ideation.      Objective:  BP 118/76 (BP Location: Left Arm, Patient Position: Sitting, Cuff Size: Normal)   Pulse 67   Temp 98.2 F (36.8 C) (Oral)   Resp 15   Ht 5\' 9"  (1.753 m)   Wt 193 lb 9.6 oz (87.8 kg)   SpO2 97%   BMI 28.59 kg/m   BP Readings from Last 3 Encounters:  02/06/18 118/76  08/09/17 122/76  08/22/16 130/82    Wt Readings from Last 3 Encounters:  02/06/18 193 lb 9.6 oz (87.8 kg)  08/09/17 199 lb 9.6 oz (90.5 kg)  08/03/16 182 lb (82.6 kg)    General appearance: alert, cooperative and appears stated age Ears: normal TM's and external ear canals both ears Throat: lips, mucosa, and tongue normal; teeth and gums normal Neck: no adenopathy, no carotid bruit, supple, symmetrical, trachea midline and thyroid not enlarged, symmetric, no tenderness/mass/nodules Back: symmetric, no curvature. ROM normal. No CVA tenderness. Lungs: clear to auscultation bilaterally Heart:  regular rate and rhythm, S1, S2 normal, no murmur, click, rub or gallop Abdomen: soft, non-tender; bowel sounds normal; no masses,  no organomegaly Pulses: 2+ and symmetric Skin: Skin color, texture, turgor normal. No rashes or lesions Lymph nodes: Cervical, supraclavicular, and axillary nodes normal.  Assessment & Plan:   Problem List Items Addressed This Visit    Hypertension    Well controlled on lisinopril.  His renal function is stable, but potassium is slightly  elevated.  Will repeat in one week.  Lab Results  Component Value Date   CREATININE 1.59  (H) 02/06/2018   Lab Results  Component Value Date   NA 137 02/06/2018   K 5.2 (H) 02/06/2018   CL 101 02/06/2018   CO2 30 02/06/2018         Hyperlipidemia with target LDL less than 100    He is tolerating low dose crestor for previously  calculated risk of CAD of 18% .  Will raise dose to 10 mg for LDL > 100  Lab Results  Component Value Date   CHOL 198 02/06/2018   HDL 51.10 02/06/2018   LDLCALC 127 (H) 02/06/2018   LDLDIRECT 154.9 12/07/2011   TRIG 101.0 02/06/2018   CHOLHDL 4 02/06/2018         CKD (chronic kidney disease) stage 3, GFR 30-59 ml/min (HCC)    Stable,  With hyperkalemia on lisinopril. Repeat K needed      Chronic pain of left knee - Primary    Due to DJDJ with probable meniscal tear  . He has not had MRI due to having one kidney as is avoiding NSAIDs. had diminishing returns on steroid injections given by Daniel Lawrence and is requesting a 2nd opinion from Daniel Lawrence       Relevant Orders   Ambulatory referral to Sports Medicine   Cerumen impaction    Bilateral , needs to use Debrox.  ENT referral       Relevant Orders   Ambulatory referral to ENT    A total of 40 minutes of face to face time was spent with patient more than half of which was spent in counselling and coordination of care   I am having Daniel Lawrence "Daniel Lawrence" maintain his loratadine, fluticasone, Vitamin D-3, folic acid, rosuvastatin, lisinopril, and Turmeric Curcumin.  No orders of the defined types were placed in this encounter.   There are no discontinued medications.  Follow-up: No follow-ups on file.   Daniel Mc, MD

## 2018-02-08 DIAGNOSIS — M25562 Pain in left knee: Principal | ICD-10-CM

## 2018-02-08 DIAGNOSIS — G8929 Other chronic pain: Secondary | ICD-10-CM | POA: Insufficient documentation

## 2018-02-08 DIAGNOSIS — N183 Chronic kidney disease, stage 3 unspecified: Secondary | ICD-10-CM | POA: Insufficient documentation

## 2018-02-08 NOTE — Assessment & Plan Note (Signed)
Stable,  With hyperkalemia on lisinopril. Repeat K needed

## 2018-02-08 NOTE — Assessment & Plan Note (Addendum)
Due to [facility] with probable meniscal tear  . He has not had MRI due to having one kidney as is avoiding NSAIDs. had diminishing returns on steroid injections given by Reche Dixon and is requesting a 2nd opinion from [facility] [employer]

## 2018-02-08 NOTE — Assessment & Plan Note (Signed)
He is tolerating low dose crestor for previously  calculated risk of CAD of 18% .  Will raise dose to 10 mg for LDL > 100  Lab Results  Component Value Date   CHOL 198 02/06/2018   HDL 51.10 02/06/2018   LDLCALC 127 (H) 02/06/2018   LDLDIRECT 154.9 12/07/2011   TRIG 101.0 02/06/2018   CHOLHDL 4 02/06/2018

## 2018-02-08 NOTE — Assessment & Plan Note (Signed)
Bilateral , needs to use Debrox.  ENT referral

## 2018-02-08 NOTE — Assessment & Plan Note (Signed)
Well controlled on lisinopril.  His renal function is stable, but potassium is slightly  elevated.  Will repeat in one week.  Lab Results  Component Value Date   CREATININE 1.59 (H) 02/06/2018   Lab Results  Component Value Date   NA 137 02/06/2018   K 5.2 (H) 02/06/2018   CL 101 02/06/2018   CO2 30 02/06/2018

## 2018-02-21 DIAGNOSIS — H6123 Impacted cerumen, bilateral: Secondary | ICD-10-CM | POA: Diagnosis not present

## 2018-02-21 DIAGNOSIS — H9190 Unspecified hearing loss, unspecified ear: Secondary | ICD-10-CM | POA: Diagnosis not present

## 2018-02-22 ENCOUNTER — Other Ambulatory Visit: Payer: Self-pay | Admitting: Internal Medicine
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
## 2018-02-25 ENCOUNTER — Encounter: Payer: Self-pay | Admitting: Family Medicine

## 2018-02-25 ENCOUNTER — Ambulatory Visit (INDEPENDENT_AMBULATORY_CARE_PROVIDER_SITE_OTHER): Payer: BLUE CROSS/BLUE SHIELD | Admitting: Family Medicine

## 2018-02-25 ENCOUNTER — Ambulatory Visit: Payer: Self-pay

## 2018-02-25 VITALS — BP 118/82 | HR 68 | Ht 69.0 in | Wt 194.0 lb

## 2018-02-25 DIAGNOSIS — M1712 Unilateral primary osteoarthritis, left knee: Secondary | ICD-10-CM

## 2018-02-25 DIAGNOSIS — M25562 Pain in left knee: Principal | ICD-10-CM

## 2018-02-25 DIAGNOSIS — G8929 Other chronic pain: Secondary | ICD-10-CM | POA: Diagnosis not present

## 2018-02-25 MED ORDER — DICLOFENAC SODIUM 2 % TD SOLN: 2.0000 g | Freq: Two times a day (BID) | TRANSDERMAL | 3 refills | Status: DC

## 2018-02-25 NOTE — Patient Instructions (Signed)
Good to see you  Ice 20 minutes 2 times daily. Usually after activity and before bed. pennsaid pinkie amount topically 2 times daily as needed.  Exercises 3 times a week.   Injected the knee today  We will get approval for synvisc in case next time I see you Ryan from [facility] will call you on the brace.  See me again in 4 weeks

## 2018-02-25 NOTE — Assessment & Plan Note (Signed)
Patient given injection today.  Does have severe knee arthritis noted home exercise given, discussed bracing and I do feel due to patient's abnormal calf to thigh ratio patient will be fitted for a custom brace.  Patient is having varus instability and I think this will be beneficial.  Discussed with patient about Visco supplementation.  May consider at follow-up.  Follow-up again in 4 weeks

## 2018-03-08 DIAGNOSIS — M1712 Unilateral primary osteoarthritis, left knee: Secondary | ICD-10-CM | POA: Diagnosis not present
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
## 2018-03-25 ENCOUNTER — Encounter: Payer: Self-pay | Admitting: Family Medicine

## 2018-03-25 ENCOUNTER — Ambulatory Visit (INDEPENDENT_AMBULATORY_CARE_PROVIDER_SITE_OTHER): Payer: BLUE CROSS/BLUE SHIELD | Admitting: Family Medicine

## 2018-03-25 DIAGNOSIS — M1712 Unilateral primary osteoarthritis, left knee: Secondary | ICD-10-CM | POA: Diagnosis not present

## 2018-03-25 NOTE — Patient Instructions (Signed)
Good to see you  Ice is your friend Put lotion 10 minute after the pennsaid and use less each time.  Call ryan  See me again in 2 months

## 2018-03-25 NOTE — Assessment & Plan Note (Signed)
Improved after the injection.  Encourage patient to contact the supplier for the brace for fitting again.  We discussed the topical anti-inflammatories and lotion that could be beneficial.  Discussed icing regimen.  We discussed continuing the vitamin supplementation.  Could be a candidate for Visco supplementation if needed.  Follow-up with me again in 4 weeks if worsening symptoms otherwise as needed

## 2018-04-04 DIAGNOSIS — Z0279 Encounter for issue of other medical certificate: Secondary | ICD-10-CM

## 2018-04-08 ENCOUNTER — Telehealth: Payer: Self-pay

## 2018-04-08 NOTE — Telephone Encounter (Signed)
Copied from [place] [id number]. Topic: Inquiry >> Apr 08, 2018 11:56 AM Judyann Munson wrote: Reason for CRM: patient is  to request a call back in regards to a  increase in his medication rosuvastatin (CRESTOR) 5 MG tablet. He is requesting to drop off a physician form in regards to a  insurance decrease, his best [phone].

## 2018-04-09 NOTE — Telephone Encounter (Signed)
Pt dropped off cpe form to be filled out. Placed in Dr. Demetrios Isaacs color folder upfront.. Pt stated if this form cannot be completed by 9/30 he needs a phone to let him know.

## 2018-04-10 NOTE — Telephone Encounter (Signed)
Form signed,  Will return to your Friday 9/27  > it needs the [place] stamp. No charge if he had already filled in the labs.  If you did it, the charge is $29

## 2018-04-10 NOTE — Telephone Encounter (Signed)
Form has been placed in red folder.  

## 2018-04-11 MED ORDER — ROSUVASTATIN CALCIUM 10 MG PO TABS: 10.0000 mg | ORAL_TABLET | Freq: Every day | ORAL | 5 refills | Status: DC

## 2018-04-11 NOTE — Addendum Note (Signed)
Addended by: Adair Laundry on: 04/11/2018 02:11 PM   Modules accepted: Orders

## 2018-04-11 NOTE — Telephone Encounter (Signed)
Spoke with pt to let him know that the prescription has been fixed and the form has been completed and is ready to be picked up. Pt gave a verbal understanding.

## 2018-04-12 LAB — CBC AND DIFFERENTIAL
HCT: 47 (ref 41–53)
HEMOGLOBIN: 15.8 (ref 13.5–17.5)
Platelets: 171 (ref 150–399)
WBC: 3.9

## 2018-04-12 LAB — LIPID PANEL
CHOLESTEROL: 166 (ref 0–200)
HDL: 55 (ref 35–70)
LDL Cholesterol: 96
TRIGLYCERIDES: 75 (ref 40–160)

## 2018-04-12 LAB — BASIC METABOLIC PANEL
BUN: 22 — AB (ref 4–21)
CREATININE: 1.6 — AB (ref 0.6–1.3)

## 2018-04-12 LAB — HEPATIC FUNCTION PANEL: Bilirubin, Direct: 0.15 (ref 0.01–0.4)

## 2018-04-12 LAB — IRON,TIBC AND FERRITIN PANEL: IRON: 82

## 2018-04-14 ENCOUNTER — Telehealth: Payer: Self-pay | Admitting: Internal Medicine

## 2018-04-14 NOTE — Telephone Encounter (Signed)
Fasting labs received and reviewed,  Renal functio nis stable,  All others are normal ,  Please  abstract

## 2018-04-14 NOTE — Telephone Encounter (Signed)
Spoke with pt and informed him of his lab results. Pt gave a verbal understanding. Labs abstracted.

## 2018-04-21 ENCOUNTER — Encounter: Payer: Self-pay | Admitting: Lab

## 2018-08-16 ENCOUNTER — Other Ambulatory Visit: Payer: Self-pay | Admitting: Internal Medicine

## 2018-09-09 DIAGNOSIS — I10 Essential (primary) hypertension: Secondary | ICD-10-CM | POA: Diagnosis not present

## 2018-09-09 DIAGNOSIS — N183 Chronic kidney disease, stage 3 (moderate): Secondary | ICD-10-CM | POA: Diagnosis not present

## 2018-09-27 ENCOUNTER — Other Ambulatory Visit: Payer: Self-pay | Admitting: Internal Medicine

## 2018-11-25 ENCOUNTER — Other Ambulatory Visit: Payer: Self-pay | Admitting: Internal Medicine

## 2019-01-27 ENCOUNTER — Other Ambulatory Visit: Payer: Self-pay | Admitting: Internal Medicine

## 2019-02-12 ENCOUNTER — Other Ambulatory Visit: Payer: Self-pay | Admitting: Internal Medicine

## 2019-02-12 DIAGNOSIS — I10 Essential (primary) hypertension: Secondary | ICD-10-CM

## 2019-02-12 NOTE — Telephone Encounter (Signed)
Refill request for lisinopril.

## 2019-02-12 NOTE — Telephone Encounter (Signed)
Patient is overdue for 6 month follow up ,  Last seen July 2019.  No refill on zestril.  Needs medication change  To losartan for safety reasons and only after he has had a nonfasting CMET which has been ordered

## 2019-02-19 ENCOUNTER — Telehealth: Payer: Self-pay

## 2019-02-19 ENCOUNTER — Other Ambulatory Visit (INDEPENDENT_AMBULATORY_CARE_PROVIDER_SITE_OTHER): Payer: BC Managed Care – PPO

## 2019-02-19 ENCOUNTER — Other Ambulatory Visit: Payer: Self-pay

## 2019-02-19 DIAGNOSIS — I10 Essential (primary) hypertension: Secondary | ICD-10-CM | POA: Diagnosis not present

## 2019-02-19 DIAGNOSIS — E785 Hyperlipidemia, unspecified: Secondary | ICD-10-CM | POA: Diagnosis not present

## 2019-02-19 NOTE — Telephone Encounter (Signed)
Spoke with pt to let him know that the lipid panel has been added. Pt gave a verbal understanding.

## 2019-02-19 NOTE — Telephone Encounter (Signed)
Copied from [place] [id number]. Topic: General - Other >> Feb 19, 2019  9:07 AM Leward Quan A wrote: Reason for CRM: Patient called to ask Dr Derrel Nip can she please add a lipid panel to his blood work that he is coming in for this afternoon. He want to have all blood work completed before appointment in September.  If there are any questions please call Ph# [phone]

## 2019-02-19 NOTE — Addendum Note (Signed)
Addended by: Adair Laundry on: 02/19/2019 03:09 PM   Modules accepted: Orders

## 2019-02-20 LAB — COMPREHENSIVE METABOLIC PANEL
ALT: 21 U/L (ref 0–53)
AST: 19 U/L (ref 0–37)
Albumin: 4.7 g/dL (ref 3.5–5.2)
Alkaline Phosphatase: 60 U/L (ref 39–117)
BUN: 15 mg/dL (ref 6–23)
CO2: 28 mEq/L (ref 19–32)
Calcium: 9.9 mg/dL (ref 8.4–10.5)
Chloride: 99 mEq/L (ref 96–112)
Creatinine, Ser: 1.48 mg/dL (ref 0.40–1.50)
GFR: 48.5 mL/min — ABNORMAL LOW (ref >60.00)
Glucose, Bld: 83 mg/dL (ref 70–99)
Potassium: 4 mEq/L (ref 3.5–5.1)
Sodium: 137 mEq/L (ref 135–145)
Total Bilirubin: 0.9 mg/dL (ref 0.2–1.2)
Total Protein: 7.2 g/dL (ref 6.0–8.3)

## 2019-02-20 LAB — LIPID PANEL
Cholesterol: 168 mg/dL (ref 0–200)
HDL: 42.5 mg/dL (ref >39.00)
LDL Cholesterol: 100 mg/dL — ABNORMAL HIGH (ref 0–99)
NonHDL: 125.61
Total CHOL/HDL Ratio: 4
Triglycerides: 126 mg/dL (ref 0.0–149.0)
VLDL: 25.2 mg/dL (ref 0.0–40.0)

## 2019-02-20 MED ORDER — LOSARTAN POTASSIUM 50 MG PO TABS: 50.0000 mg | ORAL_TABLET | Freq: Every day | ORAL | 0 refills | Status: DC

## 2019-02-20 NOTE — Telephone Encounter (Signed)
Pt had lab work done yesterday.

## 2019-02-20 NOTE — Telephone Encounter (Signed)
Labs normal.  Changing lisinopril to losartan see below:   Please tell patient that  I am making a decision to change patient's ACE Inhibitor (lisinopril) to an ARB  (losartan)  based on increased reports of  angioedema (tongue swelling that can cause airway compromise) .  I also advised patient to  take it at night instead of morning,  as recent studies have shown a reduction in incidence of heart attacks and strokes.

## 2019-02-24 ENCOUNTER — Other Ambulatory Visit: Payer: Self-pay | Admitting: Internal Medicine

## 2019-02-25 NOTE — Telephone Encounter (Signed)
mychart message has been sent to pt.

## 2019-04-10 ENCOUNTER — Other Ambulatory Visit: Payer: Self-pay

## 2019-04-13 ENCOUNTER — Other Ambulatory Visit: Payer: Self-pay

## 2019-04-13 ENCOUNTER — Encounter: Payer: Self-pay | Admitting: Internal Medicine

## 2019-04-13 ENCOUNTER — Ambulatory Visit (INDEPENDENT_AMBULATORY_CARE_PROVIDER_SITE_OTHER): Payer: BC Managed Care – PPO | Admitting: Internal Medicine

## 2019-04-13 VITALS — BP 130/76 | HR 58 | Temp 97.5°F | Resp 14 | Ht 69.0 in | Wt 189.2 lb

## 2019-04-13 DIAGNOSIS — Z0000 Encounter for general adult medical examination without abnormal findings: Secondary | ICD-10-CM

## 2019-04-13 DIAGNOSIS — M25562 Pain in left knee: Secondary | ICD-10-CM

## 2019-04-13 DIAGNOSIS — R6882 Decreased libido: Secondary | ICD-10-CM

## 2019-04-13 DIAGNOSIS — N183 Chronic kidney disease, stage 3 unspecified: Secondary | ICD-10-CM

## 2019-04-13 DIAGNOSIS — Z125 Encounter for screening for malignant neoplasm of prostate: Secondary | ICD-10-CM

## 2019-04-13 DIAGNOSIS — G8929 Other chronic pain: Secondary | ICD-10-CM | POA: Diagnosis not present

## 2019-04-13 DIAGNOSIS — Z23 Encounter for immunization: Secondary | ICD-10-CM

## 2019-04-13 NOTE — Progress Notes (Signed)
Patient ID: Daniel Lawrence, male    DOB: Oct 25, 1960  Age: 58 y.o. MRN: [id number]  The patient is here for annual  Preventive examination and management of other chronic and acute problems.   The risk factors are reflected in the social history.  The roster of all physicians providing medical care to patient - is listed in the Snapshot section of the chart.  Activities of daily living:  The patient is 100% independent in all ADLs: dressing, toileting, feeding as well as independent mobility  Home safety : The patient has smoke detectors in the home. They wear seatbelts.  There are no firearms at home. There is no violence in the home.   There is no risks for hepatitis, STDs or HIV. There is no   history of blood transfusion. They have no travel history to infectious disease endemic areas of the world.  The patient has seen their dentist in the last six month. They have seen their eye doctor in the last year. They admit to slight hearing difficulty with regard to whispered voices and some television programs.  They have deferred audiologic testing in the last year.  They do not  have excessive sun exposure. Discussed the need for sun protection: hats, long sleeves and use of sunscreen if there is significant sun exposure.   Diet: the importance of a healthy diet is discussed. They do have a healthy diet.  The benefits of regular aerobic exercise were discussed. He walks 5 times per week ,  60 minutes.   Depression screen: there are no signs or vegative symptoms of depression- irritability, change in appetite, anhedonia, sadness/tearfullness.   The following portions of the patient's history were reviewed and updated as appropriate: allergies, current medications, past family history, past medical history,  past surgical history, past social history  and problem list.  Visual acuity was not assessed per patient preference since she has regular follow up with her ophthalmologist. Hearing and  body mass index were assessed and reviewed.   During the course of the visit the patient was educated and counseled about appropriate screening and preventive services including : fall prevention , diabetes screening, nutrition counseling, colorectal cancer screening, and recommended immunizations.    CC: The primary encounter diagnosis was Low libido. Diagnoses of Need for immunization against influenza, Prostate cancer screening, Encounter for preventive health examination, CKD (chronic kidney disease) stage 3, GFR 30-59 ml/min (HCC), Chronic pain of left knee, and Loss of libido were also pertinent to this visit.  Saw nephrologist in Jan 2020. [facility]  Wasn't happy with plan,  saw an alternative medication physician .  elminiated all inflammatory foods including  Alcohol,  Coffee,  nuts and spicy food. Did this for 3 months.  Also started drinking  an herbal tea and a mixture of powdered herb 3 times daily with meals.   Told to stop fasting,  .  Recommended by  Dr Haskell Riling in [place] (ayurvedic medicine and yoga practitioner )   Lost weight after following her advice and taking her supplements for several months.  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  Readings have been for the most part <130/80 at rest . Patient is following a reduce salt diet most days and is taking medications as prescribed   Last OV Jan 2019   History Daniel Lawrence has a past medical history of Allergy and Hypertension.   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   His family history includes Alzheimer's disease in his maternal aunt, maternal uncle, and mother; Cancer in his maternal grandfather and maternal grandmother; Heart disease in his father.He reports that he has never smoked. He has never used smokeless tobacco. He reports current alcohol use of about 6.0 standard drinks of alcohol per week. He reports that he does not use drugs.  Outpatient Medications Prior to Visit  Medication Sig Dispense Refill  . Black Pepper-Turmeric (TURMERIC CURCUMIN) 11-998 MG CAPS     . Cholecalciferol (VITAMIN D-3) 1000 units CAPS     . fluticasone (FLONASE) 50 MCG/ACT nasal spray Place 2 sprays into both nostrils daily. 16 g 0  . loratadine (CLARITIN) 10 MG tablet Take 10 mg by mouth daily as needed for allergies.     Marland Kitchen losartan (COZAAR) 50 MG tablet Take 1 tablet (50 mg total) by mouth at bedtime. 90 tablet 0  . rosuvastatin (CRESTOR) 10 MG tablet TAKE 1 TABLET BY MOUTH EVERY DAY 90 tablet 1  . Diclofenac Sodium (PENNSAID) 2 % SOLN Place 2 g onto the skin 2 (two) times daily. [id number] g 3  . folic acid (FOLVITE) A999333 MCG tablet Take 800 mcg by mouth daily.     Marland Kitchen lisinopril (PRINIVIL,ZESTRIL) 10 MG tablet TAKE 1 TABLET (10 MG TOTAL) BY MOUTH DAILY. NEEDS APPT FOR FURTHER REFILLS 90 tablet 1   No facility-administered medications prior to visit.     Review of Systems   Patient denies headache, fevers, malaise, unintentional weight loss, skin rash, eye pain, sinus congestion and sinus pain, sore throat, dysphagia,  hemoptysis , cough, dyspnea, wheezing, chest pain, palpitations, orthopnea, edema, abdominal pain, nausea, melena, diarrhea, constipation, flank pain, dysuria, hematuria, urinary  Frequency, nocturia, numbness, tingling, seizures,  Focal weakness, Loss of consciousness,  Tremor, insomnia, depression, anxiety, and suicidal ideation.       Objective:  BP 130/76 (BP Location: Left Arm, Patient Position: Sitting, Cuff Size: Normal)   Pulse (!) 58   Temp (!) 97.5 F (36.4 C) (Temporal)   Resp 14   Ht 5\' 9"  (1.753 m)   Wt 189 lb 3.2 oz (85.8 kg)   SpO2 99%   BMI 27.94 kg/m   Physical Exam  General appearance: alert, cooperative and appears stated age Ears: normal TM's and external ear canals both ears Throat: lips, mucosa, and tongue normal; teeth and gums normal Neck: no adenopathy, no carotid bruit, supple, symmetrical, trachea midline and thyroid not enlarged, symmetric, no tenderness/mass/nodules Back: symmetric, no curvature. ROM normal. No CVA tenderness. Lungs: clear to auscultation bilaterally Heart: regular rate and rhythm, S1, S2 normal, no murmur, click, rub or gallop Abdomen: soft, non-tender; bowel sounds normal; no masses,  no organomegaly Pulses: 2+ and symmetric Skin: Skin color, texture, turgor normal. No rashes or lesions Lymph nodes: Cervical, supraclavicular, and axillary nodes normal.   Assessment & Plan:   Problem List Items Addressed This Visit      Unprioritized   Encounter for  preventive health examination    age appropriate education and counseling updated, referrals for preventative services and immunizations addressed, dietary and smoking counseling addressed, most recent labs reviewed.  I have personally reviewed and have noted:  1) the patient's medical and social history 2) The pt's use of alcohol, tobacco, and illicit drugs 3) The patient's current medications and supplements 4) Functional ability including ADL's, fall risk, home safety risk, hearing and visual impairment 5) Diet and physical activities 6) Evidence for depression or mood disorder 7) The patient's height, weight, and BMI have been recorded in the chart  I have made referrals, and provided counseling and education based on review of the above      Chronic pain of left knee    He notes improvement in  edema and inflammation the  with new diet recommended by the holistic specialist he saw in [facility].       CKD (chronic kidney disease) stage 3, GFR 30-59 ml/min (HCC)    Improved since stopping lisinopril and changing diet .  Continue losartan  Lab Results  Component Value Date   NA 137 02/19/2019   K 4.0 02/19/2019   CL 99 02/19/2019   CO2 28 02/19/2019   Lab Results  Component Value Date   CREATININE 1.48 02/19/2019         Loss of libido    Reported today  With inability to achieve orgasm easily.  Checking testosterone level        Other Visit Diagnoses    Low libido    -  Primary   Relevant Orders   Testos,Total,Free and SHBG (Male)   PSA (Completed)   TSH (Completed)   Need for immunization against influenza       Relevant Orders   Flu Vaccine QUAD 36+ mos IM (Completed)   Prostate cancer screening       Relevant Orders   PSA (Completed)   TSH (Completed)      I have discontinued Domenick Gong. Camey "[name]"'s folic acid, Diclofenac Sodium, and lisinopril. I am also having him maintain his loratadine, fluticasone, Vitamin D-3, Turmeric Curcumin, losartan, and rosuvastatin.  No orders of the defined types were placed in this encounter.   Medications Discontinued During This Encounter  Medication Reason  . folic acid (FOLVITE) A999333 MCG tablet Patient has not taken in last 30 days  . Diclofenac Sodium (PENNSAID) 2 % SOLN Patient has not taken in last 30 days  . lisinopril (PRINIVIL,ZESTRIL) 10 MG tablet Change in therapy    Follow-up: Return in about 1 year (around 04/12/2020).   [name], MD

## 2019-04-13 NOTE — Patient Instructions (Addendum)
I would be VERY  INTERESTED in Dr. Rioux's opinions and advice since it benefitted you in many ways   Health Maintenance, Male Adopting a healthy lifestyle and getting preventive care are important in promoting health and wellness. Ask your health care provider about:  The right schedule for you to have regular tests and exams.  Things you can do on your own to prevent diseases and keep yourself healthy. What should I know about diet, weight, and exercise? Eat a healthy diet   Eat a diet that includes plenty of vegetables, fruits, low-fat dairy products, and lean protein.  Do not eat a lot of foods that are high in solid fats, added sugars, or sodium. Maintain a healthy weight Body mass index (BMI) is a measurement that can be used to identify possible weight problems. It estimates body fat based on height and weight. Your health care provider can help determine your BMI and help you achieve or maintain a healthy weight. Get regular exercise Get regular exercise. This is one of the most important things you can do for your health. Most adults should:  Exercise for at least 150 minutes each week. The exercise should increase your heart rate and make you sweat (moderate-intensity exercise).  Do strengthening exercises at least twice a week. This is in addition to the moderate-intensity exercise.  Spend less time sitting. Even light physical activity can be beneficial. Watch cholesterol and blood lipids Have your blood tested for lipids and cholesterol at 60 years of age, then have this test every 5 years. You may need to have your cholesterol levels checked more often if:  Your lipid or cholesterol levels are high.  You are older than 60 years of age.  You are at high risk for heart disease. What should I know about cancer screening? Many types of cancers can be detected early and may often be prevented. Depending on your health history and family history, you may need to have  cancer screening at various ages. This may include screening for:  Colorectal cancer.  Prostate cancer.  Skin cancer.  Lung cancer. What should I know about heart disease, diabetes, and high blood pressure? Blood pressure and heart disease  High blood pressure causes heart disease and increases the risk of stroke. This is more likely to develop in people who have high blood pressure readings, are of African descent, or are overweight.  Talk with your health care provider about your target blood pressure readings.  Have your blood pressure checked: ? Every 3-5 years if you are 28-35 years of age. ? Every year if you are 74 years old or older.  If you are between the ages of 16 and 92 and are a current or former smoker, ask your health care provider if you should have a one-time screening for abdominal aortic aneurysm (AAA). Diabetes Have regular diabetes screenings. This checks your fasting blood sugar level. Have the screening done:  Once every three years after age 66 if you are at a normal weight and have a low risk for diabetes.  More often and at a younger age if you are overweight or have a high risk for diabetes. What should I know about preventing infection? Hepatitis B If you have a higher risk for hepatitis B, you should be screened for this virus. Talk with your health care provider to find out if you are at risk for hepatitis B infection. Hepatitis C Blood testing is recommended for:  Everyone born from [birth year] through  [place] with known risk factors for hepatitis C. Sexually transmitted infections (STIs)  You should be screened each year for STIs, including gonorrhea and chlamydia, if: ? You are sexually active and are younger than 60 years of age. ? You are older than 60 years of age and your health care provider tells you that you are at risk for this type of infection. ? Your sexual activity has changed since you were last screened, and you are at increased  risk for chlamydia or gonorrhea. Ask your health care provider if you are at risk.  Ask your health care provider about whether you are at high risk for HIV. Your health care provider may recommend a prescription medicine to help prevent HIV infection. If you choose to take medicine to prevent HIV, you should first get tested for HIV. You should then be tested every 3 months for as long as you are taking the medicine. Follow these instructions at home: Lifestyle  Do not use any products that contain nicotine or tobacco, such as cigarettes, e-cigarettes, and chewing tobacco. If you need help quitting, ask your health care provider.  Do not use street drugs.  Do not share needles.  Ask your health care provider for help if you need support or information about quitting drugs. Alcohol use  Do not drink alcohol if your health care provider tells you not to drink.  If you drink alcohol: ? Limit how much you have to 0-2 drinks a day. ? Be aware of how much alcohol is in your drink. In the [place] one drink equals one 12 oz bottle of beer (355 mL), one 5 oz glass of wine (148 mL), or one 1 oz glass of hard liquor (44 mL). General instructions  Schedule regular health, dental, and eye exams.  Stay current with your vaccines.  Tell your health care provider if: ? You often feel depressed. ? You have ever been abused or do not feel safe at home. Summary  Adopting a healthy lifestyle and getting preventive care are important in promoting health and wellness.  Follow your health care provider's instructions about healthy diet, exercising, and getting tested or screened for diseases.  Follow your health care provider's instructions on monitoring your cholesterol and blood pressure. This information is not intended to replace advice given to you by your health care provider. Make sure you discuss any questions you have with your health care provider. Document Released: 12/29/2007 Document  Revised: 06/25/2018 Document Reviewed: 06/25/2018 Elsevier Patient Education  2020 [employer].

## 2019-04-14 DIAGNOSIS — R6882 Decreased libido: Secondary | ICD-10-CM | POA: Insufficient documentation

## 2019-04-14 LAB — TSH: TSH: 2.48 mIU/L (ref 0.40–4.50)

## 2019-04-14 LAB — PSA: PSA: 1.1 ng/mL (ref <=4.0)

## 2019-04-14 NOTE — Assessment & Plan Note (Signed)
He notes improvement in edema and inflammation the  with new diet recommended by the holistic specialist he saw in [facility].

## 2019-04-14 NOTE — Assessment & Plan Note (Signed)
Reported today  With inability to achieve orgasm easily.  Checking testosterone level

## 2019-04-14 NOTE — Assessment & Plan Note (Signed)

## 2019-04-14 NOTE — Assessment & Plan Note (Signed)
Improved since stopping lisinopril and changing diet .  Continue losartan  Lab Results  Component Value Date   NA 137 02/19/2019   K 4.0 02/19/2019   CL 99 02/19/2019   CO2 28 02/19/2019   Lab Results  Component Value Date   CREATININE 1.48 02/19/2019

## 2019-04-23 LAB — TESTOS,TOTAL,FREE AND SHBG (FEMALE)
Free Testosterone: 67.7 pg/mL (ref 35.0–155.0)
Sex Hormone Binding: 48 nmol/L (ref 22–77)
Testosterone, Total, LC-MS-MS: 509 ng/dL (ref 250–1100)

## 2019-05-11 DIAGNOSIS — C641 Malignant neoplasm of right kidney, except renal pelvis: Secondary | ICD-10-CM | POA: Diagnosis not present

## 2019-06-20 ENCOUNTER — Other Ambulatory Visit: Payer: Self-pay | Admitting: Internal Medicine

## 2019-06-22 DIAGNOSIS — Z119 Encounter for screening for infectious and parasitic diseases, unspecified: Secondary | ICD-10-CM | POA: Diagnosis not present

## 2019-07-13 DIAGNOSIS — Z119 Encounter for screening for infectious and parasitic diseases, unspecified: Secondary | ICD-10-CM | POA: Diagnosis not present

## 2019-08-06 DIAGNOSIS — Z119 Encounter for screening for infectious and parasitic diseases, unspecified: Secondary | ICD-10-CM | POA: Diagnosis not present

## 2019-09-08 ENCOUNTER — Other Ambulatory Visit: Payer: Self-pay | Admitting: Internal Medicine

## 2019-10-15 ENCOUNTER — Other Ambulatory Visit: Payer: Self-pay

## 2019-10-15 ENCOUNTER — Ambulatory Visit: Payer: BC Managed Care – PPO | Attending: Internal Medicine

## 2019-10-15 DIAGNOSIS — Z23 Encounter for immunization: Secondary | ICD-10-CM

## 2019-10-15 NOTE — Progress Notes (Signed)
   Covid-19 Vaccination Clinic  Name:  Daniel Lawrence    MRN: [id number] DOB: 1960-11-30  10/15/2019  Mr. Daniel Lawrence was observed post Covid-19 immunization for 15 minutes without incident. He was provided with Vaccine Information Sheet and instruction to access the V-Safe system.   Mr. Daniel Lawrence was instructed to call 911 with any severe reactions post vaccine: Marland Kitchen Difficulty breathing  . Swelling of face and throat  . A fast heartbeat  . A bad rash all over body  . Dizziness and weakness   Immunizations Administered    Name Date Dose VIS Date Route   Pfizer COVID-19 Vaccine 10/15/2019 11:15 AM 0.3 mL 06/26/2019 Intramuscular   Manufacturer: [place]   Lot: [id number]   [place]: [id number]

## 2019-10-30 DIAGNOSIS — N183 Chronic kidney disease, stage 3 unspecified: Secondary | ICD-10-CM | POA: Diagnosis not present

## 2019-10-30 DIAGNOSIS — I10 Essential (primary) hypertension: Secondary | ICD-10-CM | POA: Diagnosis not present

## 2019-11-06 ENCOUNTER — Other Ambulatory Visit: Payer: Self-pay | Admitting: Internal Medicine

## 2019-11-10 ENCOUNTER — Ambulatory Visit: Payer: BC Managed Care – PPO | Attending: Internal Medicine

## 2019-11-10 DIAGNOSIS — Z23 Encounter for immunization: Secondary | ICD-10-CM

## 2019-11-10 NOTE — Progress Notes (Signed)
   Covid-19 Vaccination Clinic  Name:  KINGSTYN HOLAWAY    MRN: [id number] DOB: [birth year]  11/10/2019  Mr. Micek was observed post Covid-19 immunization for 15 minutes without incident. He was provided with Vaccine Information Sheet and instruction to access the V-Safe system.   Mr. Moyo was instructed to call 911 with any severe reactions post vaccine: Marland Kitchen Difficulty breathing  . Swelling of face and throat  . A fast heartbeat  . A bad rash all over body  . Dizziness and weakness   Immunizations Administered    Name Date Dose VIS Date Route   Pfizer COVID-19 Vaccine 11/10/2019  4:45 PM 0.3 mL 09/09/2018 Intramuscular   Manufacturer: [place]   Lot: [id number]   [place]: [id number]

## 2020-02-15 DIAGNOSIS — L578 Other skin changes due to chronic exposure to nonionizing radiation: Secondary | ICD-10-CM | POA: Diagnosis not present

## 2020-02-15 DIAGNOSIS — L820 Inflamed seborrheic keratosis: Secondary | ICD-10-CM | POA: Diagnosis not present

## 2020-02-15 DIAGNOSIS — D225 Melanocytic nevi of trunk: Secondary | ICD-10-CM | POA: Diagnosis not present

## 2020-02-15 DIAGNOSIS — L821 Other seborrheic keratosis: Secondary | ICD-10-CM | POA: Diagnosis not present

## 2020-03-12 ENCOUNTER — Other Ambulatory Visit: Payer: Self-pay | Admitting: Internal Medicine

## 2020-04-05 LAB — LIPID PANEL
Cholesterol: 192 (ref 0–200)
HDL: 55 (ref 35–70)
LDL Cholesterol: 113
Triglycerides: 133 (ref 40–160)

## 2020-04-05 LAB — BASIC METABOLIC PANEL: Glucose: 92

## 2020-04-15 ENCOUNTER — Other Ambulatory Visit: Payer: Self-pay

## 2020-04-15 ENCOUNTER — Encounter: Payer: Self-pay | Admitting: Internal Medicine

## 2020-04-15 ENCOUNTER — Ambulatory Visit (INDEPENDENT_AMBULATORY_CARE_PROVIDER_SITE_OTHER): Payer: BC Managed Care – PPO | Admitting: Internal Medicine

## 2020-04-15 VITALS — BP 116/80 | HR 71 | Temp 98.5°F | Ht 69.02 in | Wt 184.6 lb

## 2020-04-15 DIAGNOSIS — Z0000 Encounter for general adult medical examination without abnormal findings: Secondary | ICD-10-CM | POA: Diagnosis not present

## 2020-04-15 DIAGNOSIS — I10 Essential (primary) hypertension: Secondary | ICD-10-CM

## 2020-04-15 DIAGNOSIS — N1831 Chronic kidney disease, stage 3a: Secondary | ICD-10-CM

## 2020-04-15 DIAGNOSIS — E785 Hyperlipidemia, unspecified: Secondary | ICD-10-CM

## 2020-04-15 DIAGNOSIS — Z905 Acquired absence of kidney: Secondary | ICD-10-CM

## 2020-04-15 DIAGNOSIS — Z23 Encounter for immunization: Secondary | ICD-10-CM | POA: Diagnosis not present

## 2020-04-15 DIAGNOSIS — G629 Polyneuropathy, unspecified: Secondary | ICD-10-CM | POA: Diagnosis not present

## 2020-04-15 DIAGNOSIS — Z125 Encounter for screening for malignant neoplasm of prostate: Secondary | ICD-10-CM | POA: Diagnosis not present

## 2020-04-15 DIAGNOSIS — E538 Deficiency of other specified B group vitamins: Secondary | ICD-10-CM

## 2020-04-15 MED ORDER — LOSARTAN POTASSIUM 50 MG PO TABS
50.0000 mg | ORAL_TABLET | Freq: Every day | ORAL | 1 refills | Status: DC
Start: 2020-04-15 — End: 2020-11-21

## 2020-04-15 MED ORDER — ROSUVASTATIN CALCIUM 10 MG PO TABS
10.0000 mg | ORAL_TABLET | Freq: Every day | ORAL | 1 refills | Status: DC
Start: 2020-04-15 — End: 2020-10-23

## 2020-04-15 NOTE — Progress Notes (Signed)
Patient ID: Daniel Lawrence, male    DOB: Mar 08, 1961  Age: 59 y.o. MRN: [id number]  The patient is here for annual preventive examination and management of other chronic and acute problems.  This visit occurred during the SARS-CoV-2 public health emergency.  Safety protocols were in place, including screening questions prior to the visit, additional usage of staff PPE, and extensive cleaning of exam room while observing appropriate contact time as indicated for disinfecting solutions.      The risk factors are reflected in the social history.  The roster of all physicians providing medical care to patient - is listed in the Snapshot section of the chart.  Activities of daily living:  The patient is 100% independent in all ADLs: dressing, toileting, feeding as well as independent mobility  Home safety : The patient has smoke detectors in the home. They wear seatbelts.  There are no firearms at home. There is no violence in the home.   There is no risks for hepatitis, STDs or HIV. There is no   history of blood transfusion. They have no travel history to infectious disease endemic areas of the world.  The patient has seen their dentist in the last six month. They have seen their eye doctor in the last year. They admit to slight hearing difficulty with regard to whispered voices and some television programs.  They have deferred audiologic testing in the last year.  They do not  have excessive sun exposure. Discussed the need for sun protection: hats, long sleeves and use of sunscreen if there is significant sun exposure.   Diet: the importance of a healthy diet is discussed. They do have a healthy diet.  The benefits of regular aerobic exercise were discussed. He is very active , hikes,  [employer] yoga   Depression screen: there are no signs or vegative symptoms of depression- irritability, change in appetite, anhedonia, sadness/tearfullness.  Cognitive assessment: the patient manages all  their financial and personal affairs and is actively engaged. They could relate day,date,year and events; recalled 2/3 objects at 3 minutes; performed clock-face test normally.  The following portions of the patient's history were reviewed and updated as appropriate: allergies, current medications, past family history, past medical history,  past surgical history, past social history  and problem list.  Visual acuity was not assessed per patient preference since she has regular follow up with her ophthalmologist. Hearing and body mass index were assessed and reviewed.   During the course of the visit the patient was educated and counseled about appropriate screening and preventive services including : fall prevention , diabetes screening, nutrition counseling, colorectal cancer screening, and recommended immunizations.    CC: The primary encounter diagnosis was Encounter for preventive health examination. Diagnoses of Need for immunization against influenza, B12 deficiency, Hyperlipidemia with target LDL less than 100, Stage 3a chronic kidney disease ([place]), Prostate cancer screening, Neuropathy, Primary hypertension, and H/O right nephrectomy were also pertinent to this visit.   1) Eye exam recently:  Early cataracts with visions changes  2) Saw urology for rise in PSA  .  No nodules etc.  Due for PSA today  Follow up soon   3) right inguinal hernia since 2013.  [facility] referral discussed   4)  Left knee swells at times, worse if eats junk food  Has mild bilateral DJD knees. Can walk moderately long distances with ski poles without significant pain as long as he doesn't add weight in backpacking  Using diclofenac  cream and turmeric   5) 2000 Ius of D3 daily (reduction in dose )  6) Mother is actively dying with [place] in A/L living In [place] has a past medical history of Allergy and Hypertension.   He has a past surgical history that includes Mohs surgery (March 2013);  Brain surgery; Elevation of depressed skull fracture (1971); and Robot assisted laparoscopic nephrectomy (Right, 04/20/2016).   His family history includes Alzheimer's disease in his maternal aunt, maternal uncle, and mother; Cancer in his maternal grandfather and maternal grandmother; Heart disease in his father.He reports that he has never smoked. He has never used smokeless tobacco. He reports current alcohol use of about 6.0 standard drinks of alcohol per week. He reports that he does not use drugs.  Outpatient Medications Prior to Visit  Medication Sig Dispense Refill   Black Pepper-Turmeric (TURMERIC CURCUMIN) 11-998 MG CAPS      Cholecalciferol (VITAMIN D-3) 1000 units CAPS      fluticasone (FLONASE) 50 MCG/ACT nasal spray Place 2 sprays into both nostrils daily. 16 g 0   loratadine (CLARITIN) 10 MG tablet Take 10 mg by mouth daily as needed for allergies.      losartan (COZAAR) 50 MG tablet TAKE 1 TABLET BY MOUTH EVERYDAY AT BEDTIME 30 tablet 5   rosuvastatin (CRESTOR) 10 MG tablet TAKE 1 TABLET BY MOUTH EVERY DAY 30 tablet 5   No facility-administered medications prior to visit.    Review of Systems   Patient denies headache, fevers, malaise, unintentional weight loss, skin rash, eye pain, sinus congestion and sinus pain, sore throat, dysphagia,  hemoptysis , cough, dyspnea, wheezing, chest pain, palpitations, orthopnea, edema, abdominal pain, nausea, melena, diarrhea, constipation, flank pain, dysuria, hematuria, urinary  Frequency, nocturia, numbness, tingling, seizures,  Focal weakness, Loss of consciousness,  Tremor, insomnia, depression, anxiety, and suicidal ideation.      Objective:  BP 116/80 (BP Location: Left Arm, Patient Position: Sitting)    Pulse 71    Temp 98.5 F (36.9 C)    Ht 5' 9.02" (1.753 m)    Wt 184 lb 9.6 oz (83.7 kg)    SpO2 98%    BMI 27.25 kg/m   Physical Exam  General appearance: alert, cooperative and appears stated age Ears: normal TM's and  external ear canals both ears Throat: lips, mucosa, and tongue normal; teeth and gums normal Neck: no adenopathy, no carotid bruit, supple, symmetrical, trachea midline and thyroid not enlarged, symmetric, no tenderness/mass/nodules Back: symmetric, no curvature. ROM normal. No CVA tenderness. Lungs: clear to auscultation bilaterally Heart: regular rate and rhythm, S1, S2 normal, no murmur, click, rub or gallop Abdomen: soft, non-tender; bowel sounds normal; no masses,  no organomegaly Pulses: 2+ and symmetric Skin: Skin color, texture, turgor normal. No rashes or lesions Lymph nodes: Cervical, supraclavicular, and axillary nodes normal. Foot exam:  Nails are well trimmed,  Mild callous formation over both MT heads ,  Sensation intact to microfilament  Assessment & Plan:   Problem List Items Addressed This Visit      Unprioritized   Hyperlipidemia with target LDL less than 100   Relevant Medications   rosuvastatin (CRESTOR) 10 MG tablet   losartan (COZAAR) 50 MG tablet   B12 deficiency    Rechecking b12 and folate levels given complaint of numbness. Both are normal. A1c normal as well.   Lab Results  Component Value Date   [id number] 04/15/2020   Lab Results  Component Value Date  FOLATE 8.4 04/15/2020   Lab Results  Component Value Date   HGBA1C 5.3 04/15/2020         Relevant Orders   B12 and Folate Panel (Completed)   CKD (chronic kidney disease) stage 3, GFR 30-59 ml/min (HCC)    Stable,  Avoiding NSAIDs.  Continue losartan  Lab Results  Component Value Date   NA 139 04/15/2020   K 4.3 04/15/2020   CL 102 04/15/2020   CO2 26 04/15/2020   Lab Results  Component Value Date   CREATININE 1.59 (H) 04/15/2020   Lab Results  Component Value Date   MICROALBUR 15.4 (H) 06/23/2014            Relevant Orders   Comprehensive metabolic panel (Completed)   Encounter for preventive health examination - Primary    age appropriate education and counseling  updated, referrals for preventative services and immunizations addressed, dietary and smoking counseling addressed, most recent labs reviewed.  I have personally reviewed and have noted:  1) the patient's medical and social history 2) The pt's use of alcohol, tobacco, and illicit drugs 3) The patient's current medications and supplements 4) Functional ability including ADL's, fall risk, home safety risk, hearing and visual impairment 5) Diet and physical activities 6) Evidence for depression or mood disorder 7) The patient's height, weight, and BMI have been recorded in the chart  I have made referrals, and provided counseling and education based on review of the above      H/O right nephrectomy   Hypertension    Well controlled on current regimen of losartan 50 mg daily . Renal function is slightly low but stable, no changes today.  Lab Results  Component Value Date   CREATININE 1.59 (H) 04/15/2020   Lab Results  Component Value Date   NA 139 04/15/2020   K 4.3 04/15/2020   CL 102 04/15/2020   CO2 26 04/15/2020         Relevant Medications   rosuvastatin (CRESTOR) 10 MG tablet   losartan (COZAAR) 50 MG tablet    Other Visit Diagnoses    Need for immunization against influenza       Relevant Orders   Flu Vaccine QUAD 36+ mos IM (Completed)   Prostate cancer screening       Relevant Orders   PSA (Completed)   Neuropathy       Relevant Orders   Hemoglobin A1c (Completed)      I have changed Domenick Gong. Gores "[name]"'s rosuvastatin and losartan. I am also having him maintain his loratadine, fluticasone, Vitamin D-3, and Turmeric Curcumin.  Meds ordered this encounter  Medications   rosuvastatin (CRESTOR) 10 MG tablet    Sig: Take 1 tablet (10 mg total) by mouth daily.    Dispense:  90 tablet    Refill:  1   losartan (COZAAR) 50 MG tablet    Sig: Take 1 tablet (50 mg total) by mouth daily.    Dispense:  90 tablet    Refill:  1    Medications Discontinued  During This Encounter  Medication Reason   losartan (COZAAR) 50 MG tablet Reorder   rosuvastatin (CRESTOR) 10 MG tablet     Follow-up: No follow-ups on file.   [name], MD

## 2020-04-15 NOTE — Patient Instructions (Signed)
Let me know if the [facility] specialist is Selinda Eon for the hernia referral   Health Maintenance, Male Adopting a healthy lifestyle and getting preventive care are important in promoting health and wellness. Ask your health care provider about:  The right schedule for you to have regular tests and exams.  Things you can do on your own to prevent diseases and keep yourself healthy. What should I know about diet, weight, and exercise? Eat a healthy diet   Eat a diet that includes plenty of vegetables, fruits, low-fat dairy products, and lean protein.  Do not eat a lot of foods that are high in solid fats, added sugars, or sodium. Maintain a healthy weight Body mass index (BMI) is a measurement that can be used to identify possible weight problems. It estimates body fat based on height and weight. Your health care provider can help determine your BMI and help you achieve or maintain a healthy weight. Get regular exercise Get regular exercise. This is one of the most important things you can do for your health. Most adults should:  Exercise for at least 150 minutes each week. The exercise should increase your heart rate and make you sweat (moderate-intensity exercise).  Do strengthening exercises at least twice a week. This is in addition to the moderate-intensity exercise.  Spend less time sitting. Even light physical activity can be beneficial. Watch cholesterol and blood lipids Have your blood tested for lipids and cholesterol at 61 years of age, then have this test every 5 years. You may need to have your cholesterol levels checked more often if:  Your lipid or cholesterol levels are high.  You are older than 61 years of age.  You are at high risk for heart disease. What should I know about cancer screening? Many types of cancers can be detected early and may often be prevented. Depending on your health history and family history, you may need to have cancer screening at various  ages. This may include screening for:  Colorectal cancer.  Prostate cancer.  Skin cancer.  Lung cancer. What should I know about heart disease, diabetes, and high blood pressure? Blood pressure and heart disease  High blood pressure causes heart disease and increases the risk of stroke. This is more likely to develop in people who have high blood pressure readings, are of African descent, or are overweight.  Talk with your health care provider about your target blood pressure readings.  Have your blood pressure checked: ? Every 3-5 years if you are 1-51 years of age. ? Every year if you are 58 years old or older.  If you are between the ages of 20 and 41 and are a current or former smoker, ask your health care provider if you should have a one-time screening for abdominal aortic aneurysm (AAA). Diabetes Have regular diabetes screenings. This checks your fasting blood sugar level. Have the screening done:  Once every three years after age 6 if you are at a normal weight and have a low risk for diabetes.  More often and at a younger age if you are overweight or have a high risk for diabetes. What should I know about preventing infection? Hepatitis B If you have a higher risk for hepatitis B, you should be screened for this virus. Talk with your health care provider to find out if you are at risk for hepatitis B infection. Hepatitis C Blood testing is recommended for:  Everyone born from [birth year] through 1967.  Anyone with known  risk factors for hepatitis C. Sexually transmitted infections (STIs)  You should be screened each year for STIs, including gonorrhea and chlamydia, if: ? You are sexually active and are younger than 61 years of age. ? You are older than 61 years of age and your health care provider tells you that you are at risk for this type of infection. ? Your sexual activity has changed since you were last screened, and you are at increased risk for chlamydia or  gonorrhea. Ask your health care provider if you are at risk.  Ask your health care provider about whether you are at high risk for HIV. Your health care provider may recommend a prescription medicine to help prevent HIV infection. If you choose to take medicine to prevent HIV, you should first get tested for HIV. You should then be tested every 3 months for as long as you are taking the medicine. Follow these instructions at home: Lifestyle  Do not use any products that contain nicotine or tobacco, such as cigarettes, e-cigarettes, and chewing tobacco. If you need help quitting, ask your health care provider.  Do not use street drugs.  Do not share needles.  Ask your health care provider for help if you need support or information about quitting drugs. Alcohol use  Do not drink alcohol if your health care provider tells you not to drink.  If you drink alcohol: ? Limit how much you have to 0-2 drinks a day. ? Be aware of how much alcohol is in your drink. In the [place] one drink equals one 12 oz bottle of beer (355 mL), one 5 oz glass of wine (148 mL), or one 1 oz glass of hard liquor (44 mL). General instructions  Schedule regular health, dental, and eye exams.  Stay current with your vaccines.  Tell your health care provider if: ? You often feel depressed. ? You have ever been abused or do not feel safe at home. Summary  Adopting a healthy lifestyle and getting preventive care are important in promoting health and wellness.  Follow your health care provider's instructions about healthy diet, exercising, and getting tested or screened for diseases.  Follow your health care provider's instructions on monitoring your cholesterol and blood pressure. This information is not intended to replace advice given to you by your health care provider. Make sure you discuss any questions you have with your health care provider. Document Revised: 06/25/2018 Document Reviewed:  06/25/2018 Elsevier Patient Education  2020 [employer].

## 2020-04-16 LAB — B12 AND FOLATE PANEL
Folate: 8.4 ng/mL
Vitamin B-12: 338 pg/mL (ref 200–1100)

## 2020-04-16 LAB — COMPREHENSIVE METABOLIC PANEL
AG Ratio: 1.9 (calc) (ref 1.0–2.5)
ALT: 21 U/L (ref 9–46)
AST: 19 U/L (ref 10–35)
Albumin: 4.5 g/dL (ref 3.6–5.1)
Alkaline phosphatase (APISO): 60 U/L (ref 35–144)
BUN/Creatinine Ratio: 15 (calc) (ref 6–22)
BUN: 24 mg/dL (ref 7–25)
CO2: 26 mmol/L (ref 20–32)
Calcium: 9.2 mg/dL (ref 8.6–10.3)
Chloride: 102 mmol/L (ref 98–110)
Creat: 1.59 mg/dL — ABNORMAL HIGH (ref 0.70–1.25)
Globulin: 2.4 g/dL (calc) (ref 1.9–3.7)
Glucose, Bld: 89 mg/dL (ref 65–99)
Potassium: 4.3 mmol/L (ref 3.5–5.3)
Sodium: 139 mmol/L (ref 135–146)
Total Bilirubin: 0.6 mg/dL (ref 0.2–1.2)
Total Protein: 6.9 g/dL (ref 6.1–8.1)

## 2020-04-16 LAB — HEMOGLOBIN A1C
Hgb A1c MFr Bld: 5.3 % of total Hgb (ref <5.7)
Mean Plasma Glucose: 105 (calc)
eAG (mmol/L): 5.8 (calc)

## 2020-04-16 LAB — PSA: PSA: 0.33 ng/mL (ref <=4.0)

## 2020-04-17 DIAGNOSIS — Z905 Acquired absence of kidney: Secondary | ICD-10-CM | POA: Insufficient documentation

## 2020-04-17 NOTE — Assessment & Plan Note (Signed)
Rechecking b12 and folate levels given complaint of numbness. Both are normal. A1c normal as well.   Lab Results  Component Value Date   [id number] 04/15/2020   Lab Results  Component Value Date   FOLATE 8.4 04/15/2020   Lab Results  Component Value Date   HGBA1C 5.3 04/15/2020

## 2020-04-17 NOTE — Progress Notes (Signed)
Your labs are normal .  Your Kidney function is slightly lower than last year, but similar  to the level to 2019.

## 2020-04-17 NOTE — Assessment & Plan Note (Signed)

## 2020-04-17 NOTE — Assessment & Plan Note (Signed)
Well controlled on current regimen of losartan 50 mg daily . Renal function is slightly low but stable, no changes today.  Lab Results  Component Value Date   CREATININE 1.59 (H) 04/15/2020   Lab Results  Component Value Date   NA 139 04/15/2020   K 4.3 04/15/2020   CL 102 04/15/2020   CO2 26 04/15/2020

## 2020-04-17 NOTE — Assessment & Plan Note (Signed)
Stable,  Avoiding NSAIDs.  Continue losartan  Lab Results  Component Value Date   NA 139 04/15/2020   K 4.3 04/15/2020   CL 102 04/15/2020   CO2 26 04/15/2020   Lab Results  Component Value Date   CREATININE 1.59 (H) 04/15/2020   Lab Results  Component Value Date   MICROALBUR 15.4 (H) 06/23/2014

## 2020-07-06 ENCOUNTER — Ambulatory Visit: Payer: BC Managed Care – PPO | Attending: Internal Medicine

## 2020-07-06 ENCOUNTER — Other Ambulatory Visit (HOSPITAL_COMMUNITY): Payer: Self-pay | Admitting: Internal Medicine

## 2020-07-06 DIAGNOSIS — Z23 Encounter for immunization: Secondary | ICD-10-CM

## 2020-07-06 NOTE — Progress Notes (Signed)
   Covid-19 Vaccination Clinic  Name:  Daniel Lawrence    MRN: [id number] DOB: 1960-07-31  07/06/2020  Mr. Rozeboom was observed post Covid-19 immunization for 15 minutes without incident. He was provided with Vaccine Information Sheet and instruction to access the V-Safe system.   Mr. Modica was instructed to call 911 with any severe reactions post vaccine: Marland Kitchen Difficulty breathing  . Swelling of face and throat  . A fast heartbeat  . A bad rash all over body  . Dizziness and weakness   Immunizations Administered    Name Date Dose VIS Date Route   Pfizer COVID-19 Vaccine 07/06/2020 10:45 AM 0.3 mL 05/04/2020 Intramuscular   Manufacturer: [place]   Lot: [id number]   [place]: [id number]

## 2020-10-17 DIAGNOSIS — N183 Chronic kidney disease, stage 3 unspecified: Secondary | ICD-10-CM | POA: Diagnosis not present

## 2020-10-17 DIAGNOSIS — K4090 Unilateral inguinal hernia, without obstruction or gangrene, not specified as recurrent: Secondary | ICD-10-CM

## 2020-10-17 DIAGNOSIS — I10 Essential (primary) hypertension: Secondary | ICD-10-CM | POA: Diagnosis not present

## 2020-10-23 MED ORDER — ROSUVASTATIN CALCIUM 10 MG PO TABS
15.0000 mg | ORAL_TABLET | Freq: Every day | ORAL | 0 refills | Status: DC
Start: 2020-10-23 — End: 2020-11-08

## 2020-11-07 ENCOUNTER — Other Ambulatory Visit: Payer: Self-pay

## 2020-11-07 ENCOUNTER — Ambulatory Visit: Payer: Self-pay

## 2020-11-07 ENCOUNTER — Ambulatory Visit (INDEPENDENT_AMBULATORY_CARE_PROVIDER_SITE_OTHER): Payer: BC Managed Care – PPO | Admitting: Family Medicine

## 2020-11-07 VITALS — BP 122/81 | HR 76 | Ht 69.0 in | Wt 195.6 lb

## 2020-11-07 DIAGNOSIS — G8929 Other chronic pain: Secondary | ICD-10-CM

## 2020-11-07 DIAGNOSIS — M25562 Pain in left knee: Secondary | ICD-10-CM

## 2020-11-07 DIAGNOSIS — M25561 Pain in right knee: Secondary | ICD-10-CM

## 2020-11-07 NOTE — Patient Instructions (Addendum)
Thank you for coming in today.  Please use voltaren gel up to 4x daily for pain as needed.   I recommend you obtained a compression sleeve to help with your joint problems. There are many options on the market however I recommend obtaining a full knee Body Helix compression sleeve.  You can find information (including how to appropriate measure yourself for sizing) can be found at www.Body http://www.lambert.com/.  Many of these products are health savings account (HSA) eligible.   You can use the compression sleeve at any time throughout the day but is most important to use while being active as well as for 2 hours post-activity.   It is appropriate to ice following activity with the compression sleeve in place.  Check back as needed

## 2020-11-07 NOTE — Progress Notes (Signed)
I, Wendy Poet, LAT, ATC, am serving as scribe for Dr. Lynne Leader.  Daniel Lawrence is a 60 y.o. male who presents to [facility] today for leg pain.  He was last seen by Dr. Tamala Julian on 03/25/18 for f/u of L knee pain.  Pt is a Art gallery manager. Since then, pt reports R leg pain around Sept and relates pain to compensating. For the past 2-3 weeks, pt has been experiencing R buttock that radiates down R posterior thigh. Pt reports numbness in R foot/toes. 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   Radiating pain: yes Aggravating factors: extreme knee flexion Treatments tried: none   Pertinent review of systems: No fevers or chills  Relevant historical information: Hypertension, history of nephrectomy.   Exam:  BP 122/81 (BP Location: Right Arm, Patient Position: Sitting, Cuff Size: Normal)   Pulse 76   Ht 5\' 9"  (1.753 m)   Wt 195 lb 9.6 oz (88.7 kg)   SpO2 97%   BMI 28.89 kg/m  General: Well Developed, well nourished, and in no acute distress.   MSK: Right knee mild effusion. Normal motion with crepitation. Tender palpation medial and lateral joint line. Stable ligamentous exam. Intact strength. Positive McMurray's test.     Lab and Radiology Results   Procedure: Real-time Ultrasound Guided Injection of knee superior lateral patellar space Device: Philips Affiniti 50G Images permanently stored and available for review in PACS Ultrasound evaluation prior to injection reveals moderate joint effusion.  Tiny Baker's cyst.  Degenerative medial and lateral menisci Verbal informed consent obtained.  Discussed risks and benefits of procedure. Warned about infection bleeding damage to structures skin hypopigmentation and fat atrophy among others. Patient expresses understanding and agreement Time-out conducted.   Noted no overlying erythema, induration, or other signs  of local infection.   Skin prepped in a sterile fashion.   Local anesthesia: Topical Ethyl chloride.   With sterile technique and under real time ultrasound guidance:  40 mg of Kenalog and 2 mL of Marcaine injected into joint. Fluid seen entering the joint capsule.   Completed without difficulty   Pain immediately resolved suggesting accurate placement of the medication.   Advised to call if fevers/chills, erythema, induration, drainage, or persistent bleeding.   Images permanently stored and available for review in the ultrasound unit.  Impression: Technically successful ultrasound guided injection.        Assessment and Plan: 60 y.o. male with right knee pain and effusion due to DJD.  Additionally patient likely has some degenerative menisci.  Plan for injection, Voltaren gel, and compression sleeve.  Activity as tolerated and recheck back in about 6 weeks.   PDMP not reviewed this encounter. Orders Placed This Encounter  Procedures  . [place] LIMITED JOINT SPACE STRUCTURES LOW LEFT(NO LINKED CHARGES)    Standing Status:   Future    Number of Occurrences:   1    Standing Expiration Date:   05/09/2021    Order Specific Question:   Reason for Exam (SYMPTOM  OR DIAGNOSIS REQUIRED)    Answer:   left knee pain    Order Specific Question:   Preferred imaging location?    Answer:   [place]   No orders of the defined types were placed in this encounter.    Discussed warning signs or symptoms. Please see discharge instructions. Patient expresses understanding.   The  above documentation has been reviewed and is accurate and complete Lynne Leader, M.D.

## 2020-11-08 ENCOUNTER — Telehealth: Payer: Self-pay | Admitting: Internal Medicine

## 2020-11-08 MED ORDER — ROSUVASTATIN CALCIUM 5 MG PO TABS: 5.0000 mg | ORAL_TABLET | Freq: Every day | ORAL | 1 refills | Status: DC

## 2020-11-08 MED ORDER — ROSUVASTATIN CALCIUM 10 MG PO TABS: 10.0000 mg | ORAL_TABLET | Freq: Every day | ORAL | 1 refills | Status: DC

## 2020-11-08 NOTE — Telephone Encounter (Signed)
cRESTOR prescriptions for 10 mg and 5 mg sent to [facility]

## 2020-11-08 NOTE — Telephone Encounter (Signed)
Is this okay to do?

## 2020-11-08 NOTE — Telephone Encounter (Signed)
Patient called and said that his rosuvastatin (CRESTOR) 10 MG tablet needs to be written differently so insurance covers cost. His pharmacy said the if Dr. Janne Lab could write one prescription 10mg  and another prescriptio  5mg ,  1 per day. Patient would like a 90 day supply if possible.

## 2020-11-11 ENCOUNTER — Telehealth: Payer: Self-pay | Admitting: Internal Medicine

## 2020-11-11 DIAGNOSIS — F419 Anxiety disorder, unspecified: Secondary | ICD-10-CM | POA: Diagnosis not present

## 2020-11-11 NOTE — Telephone Encounter (Signed)
lft pt vm to call ofc to follow up on referral to [facility]. thanks

## 2020-11-19 ENCOUNTER — Other Ambulatory Visit: Payer: Self-pay | Admitting: Internal Medicine

## 2020-11-23 DIAGNOSIS — F419 Anxiety disorder, unspecified: Secondary | ICD-10-CM | POA: Diagnosis not present

## 2020-12-02 DIAGNOSIS — F419 Anxiety disorder, unspecified: Secondary | ICD-10-CM | POA: Diagnosis not present

## 2020-12-09 DIAGNOSIS — F419 Anxiety disorder, unspecified: Secondary | ICD-10-CM | POA: Diagnosis not present

## 2020-12-30 DIAGNOSIS — F419 Anxiety disorder, unspecified: Secondary | ICD-10-CM | POA: Diagnosis not present

## 2021-01-13 DIAGNOSIS — F419 Anxiety disorder, unspecified: Secondary | ICD-10-CM | POA: Diagnosis not present

## 2021-01-27 DIAGNOSIS — F419 Anxiety disorder, unspecified: Secondary | ICD-10-CM | POA: Diagnosis not present

## 2021-02-10 DIAGNOSIS — F419 Anxiety disorder, unspecified: Secondary | ICD-10-CM | POA: Diagnosis not present

## 2021-02-17 DIAGNOSIS — D485 Neoplasm of uncertain behavior of skin: Secondary | ICD-10-CM | POA: Diagnosis not present

## 2021-02-17 DIAGNOSIS — D225 Melanocytic nevi of trunk: Secondary | ICD-10-CM | POA: Diagnosis not present

## 2021-02-17 DIAGNOSIS — D0361 Melanoma in situ of right upper limb, including shoulder: Secondary | ICD-10-CM | POA: Diagnosis not present

## 2021-02-17 DIAGNOSIS — L820 Inflamed seborrheic keratosis: Secondary | ICD-10-CM | POA: Diagnosis not present

## 2021-02-17 DIAGNOSIS — L821 Other seborrheic keratosis: Secondary | ICD-10-CM | POA: Diagnosis not present

## 2021-03-03 DIAGNOSIS — F419 Anxiety disorder, unspecified: Secondary | ICD-10-CM | POA: Diagnosis not present

## 2021-03-06 DIAGNOSIS — K4090 Unilateral inguinal hernia, without obstruction or gangrene, not specified as recurrent: Secondary | ICD-10-CM | POA: Diagnosis not present

## 2021-03-15 DIAGNOSIS — D0361 Melanoma in situ of right upper limb, including shoulder: Secondary | ICD-10-CM | POA: Diagnosis not present

## 2021-03-15 DIAGNOSIS — F419 Anxiety disorder, unspecified: Secondary | ICD-10-CM | POA: Diagnosis not present

## 2021-03-15 DIAGNOSIS — L905 Scar conditions and fibrosis of skin: Secondary | ICD-10-CM | POA: Diagnosis not present

## 2021-03-30 DIAGNOSIS — F419 Anxiety disorder, unspecified: Secondary | ICD-10-CM | POA: Diagnosis not present

## 2021-04-14 DIAGNOSIS — F419 Anxiety disorder, unspecified: Secondary | ICD-10-CM | POA: Diagnosis not present

## 2021-04-18 ENCOUNTER — Ambulatory Visit (INDEPENDENT_AMBULATORY_CARE_PROVIDER_SITE_OTHER): Payer: BC Managed Care – PPO | Admitting: Internal Medicine

## 2021-04-18 ENCOUNTER — Other Ambulatory Visit: Payer: Self-pay

## 2021-04-18 ENCOUNTER — Encounter: Payer: Self-pay | Admitting: Internal Medicine

## 2021-04-18 VITALS — BP 118/82 | HR 63 | Temp 96.1°F | Ht 69.0 in | Wt 196.8 lb

## 2021-04-18 DIAGNOSIS — I700 Atherosclerosis of aorta: Secondary | ICD-10-CM | POA: Diagnosis not present

## 2021-04-18 DIAGNOSIS — R6882 Decreased libido: Secondary | ICD-10-CM

## 2021-04-18 DIAGNOSIS — M1712 Unilateral primary osteoarthritis, left knee: Secondary | ICD-10-CM

## 2021-04-18 DIAGNOSIS — K4090 Unilateral inguinal hernia, without obstruction or gangrene, not specified as recurrent: Secondary | ICD-10-CM

## 2021-04-18 DIAGNOSIS — Z23 Encounter for immunization: Secondary | ICD-10-CM

## 2021-04-18 DIAGNOSIS — Z1211 Encounter for screening for malignant neoplasm of colon: Secondary | ICD-10-CM

## 2021-04-18 DIAGNOSIS — E785 Hyperlipidemia, unspecified: Secondary | ICD-10-CM

## 2021-04-18 DIAGNOSIS — Z0000 Encounter for general adult medical examination without abnormal findings: Secondary | ICD-10-CM | POA: Diagnosis not present

## 2021-04-18 DIAGNOSIS — D0361 Melanoma in situ of right upper limb, including shoulder: Secondary | ICD-10-CM | POA: Insufficient documentation

## 2021-04-18 HISTORY — DX: Melanoma in situ of right upper limb, including shoulder: D03.61

## 2021-04-18 MED ORDER — SILDENAFIL CITRATE 50 MG PO TABS: 50.0000 mg | ORAL_TABLET | Freq: Every day | ORAL | 0 refills | Status: DC | PRN

## 2021-04-18 NOTE — Assessment & Plan Note (Signed)
With decreased ability t sustain erection.  Viagra trial requested.

## 2021-04-18 NOTE — Assessment & Plan Note (Signed)
Given atherosclerosis noted on 2017 CT scan of chest   Goal is 70 on LDL.  Recent labs were done at work  And were above goal on 15 mg crestor but he wants to repeat after a few weeks of intense dietary changes.

## 2021-04-18 NOTE — Assessment & Plan Note (Signed)
Improved with [facility] done on his own.

## 2021-04-18 NOTE — Assessment & Plan Note (Signed)

## 2021-04-18 NOTE — Assessment & Plan Note (Signed)
Asymptomatic.  Has seen Orvan Falconer at [place],  Liana Gerold options of incisional vs laparascopic .

## 2021-04-18 NOTE — Assessment & Plan Note (Signed)
Removed by his dermatologist this summer.  Continue 6 month checkups .vitamin D level needed

## 2021-04-18 NOTE — Progress Notes (Signed)
Patient ID: Daniel Lawrence, male    DOB: 08-14-1960  Age: 60 y.o. MRN: [id number]  The patient is here for annual  preventive   examination and management of other chronic and acute problems.   The risk factors are reflected in the social history.  The roster of all physicians providing medical care to patient - is listed in the Snapshot section of the chart.  Activities of daily living:  The patient is 100% independent in all ADLs: dressing, toileting, feeding as well as independent mobility  Home safety : The patient has smoke detectors in the home. They wear seatbelts.  There are no firearms at home. There is no violence in the home.   There is no risks for hepatitis, STDs or HIV. There is no   history of blood transfusion. They have no travel history to infectious disease endemic areas of the world.  The patient has seen their dentist in the last six month. They have seen their eye doctor in the last year. They admit to slight hearing difficulty with regard to whispered voices and some television programs.  They have deferred audiologic testing in the last year.  They do not  have excessive sun exposure. Discussed the need for sun protection: hats, long sleeves and use of sunscreen if there is significant sun exposure.   Diet: the importance of a healthy diet is discussed. They do have a healthy diet.  The benefits of regular aerobic exercise were discussed. She walks 4 times per week ,  20 minutes.   Depression screen: there are no signs or vegative symptoms of depression- irritability, change in appetite, anhedonia, sadness/tearfullness.  Cognitive assessment: the patient manages all their financial and personal affairs and is actively engaged. They could relate day,date,year and events; recalled 2/3 objects at 3 minutes; performed clock-face test normally.  The following portions of the patient's history were reviewed and updated as appropriate: allergies, current medications, past  family history, past medical history,  past surgical history, past social history  and problem list.  Visual acuity was not assessed per patient preference since she has regular follow up with her ophthalmologist. Hearing and body mass index were assessed and reviewed.   During the course of the visit the patient was educated and counseled about appropriate screening and preventive services including : fall prevention , diabetes screening, nutrition counseling, colorectal cancer screening, and recommended immunizations.    CC: The primary encounter diagnosis was Need for immunization against influenza. 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  1) right knee pain became disabling ,in April ,   saw Sports medicine had a steroid shot and wore the sleeves, restarted "digital PT "  2 months ago 4 times per week (using Blue Tooth straps on calf and thigh that  provides feedback and tailored exercises to build up the muscles .  15 minute sessions .  Not bone on bone,  just arthritis  and mild meniscal tears,  .  Small Baker's cyst not enough to drain   2) right inguinal hernia   saw [facility]   3) mother died in 2023-05-04. Has gained weight since her death.  Started seeing a grief counsellor to address his grief and use  of food.  4) ED  wants to try viagra  5) labs were done at work . Fastihg glucose was 115 and LDL was elevated. Taking crestor 15 mg .   6) needs colonscopy di ussed referral to [place].  Wants the dulcolax,  miralax/gatorade   7) melanoma in situ  of the right upper arm   History Daniel Lawrence has a past medical history of Allergy and Hypertension.   Daniel Lawrence has a past surgical  history that includes Mohs surgery (March 2013); Brain surgery; Elevation of depressed skull fracture (1971); and Robot assisted laparoscopic nephrectomy (Right, 04/20/2016).   His family history includes Alzheimer's disease in his maternal aunt, maternal uncle, and mother; Cancer in his maternal grandfather and maternal grandmother; Heart disease in his father.Daniel Lawrence reports that Daniel Lawrence has never smoked. Daniel Lawrence has never used smokeless tobacco. Daniel Lawrence reports current alcohol use of about 6.0 standard drinks per week. Daniel Lawrence reports that Daniel Lawrence does not use drugs.  Outpatient Medications Prior to Visit  Medication Sig Dispense Refill   Black Pepper-Turmeric (TURMERIC CURCUMIN) 11-998 MG CAPS      Cholecalciferol (VITAMIN D-3) 1000 units CAPS      Coenzyme Q10 100 MG capsule Take 100 mg by mouth daily.     fluticasone (FLONASE) 50 MCG/ACT nasal spray Place 2 sprays into both nostrils daily. (Patient taking differently: Place 1 spray into both nostrils daily.) 16 g 0   loratadine (CLARITIN) 10 MG tablet Take 10 mg by mouth daily as needed for allergies.      losartan (COZAAR) 50 MG tablet TAKE 1 TABLET BY MOUTH EVERY DAY 30 tablet 5   rosuvastatin (CRESTOR) 10 MG tablet Take 1 tablet (10 mg total) by mouth daily. 90 tablet 1   rosuvastatin (CRESTOR) 5 MG tablet Take 1 tablet (5 mg total) by mouth daily. 90 tablet 1   COVID-19 mRNA vaccine, Pfizer, 30 MCG/0.3ML injection USE AS DIRECTED (Patient not taking: Reported on 04/18/2021) .3 mL 0   No facility-administered medications prior to visit.    Review of Systems  Objective:  BP 118/82 (BP Location: Left Arm, Patient Position: Sitting, Cuff Size: Normal)   Pulse 63   Temp (!) 96.1 F (35.6 C) (Temporal)   Ht 5\' 9"  (1.753 m)   Wt 196 lb 12.8 oz (89.3 kg)   SpO2 99%   BMI 29.06 kg/m   Physical Exam  Physical Exam   Assessment & Plan:   Problem List Items Addressed This Visit       Unprioritized   Aortic atherosclerosis (HCC)   Relevant Medications    sildenafil (VIAGRA) 50 MG tablet   Degenerative arthritis of left knee    Improved with Blue Tooth PT done on his own.       Encounter for preventive health examination    age appropriate education and counseling updated, referrals for preventative services and immunizations addressed, dietary and smoking counseling addressed, most recent labs reviewed.  I have personally reviewed and have noted:   1) the patient's medical and social history 2) The pt's use of alcohol, tobacco, and illicit drugs 3) The patient's current medications and supplements 4) Functional ability including ADL's, fall risk, home safety risk, hearing and visual impairment 5) Diet and physical activities 6) Evidence for depression or mood disorder 7) The patient's height, weight, and BMI have been recorded in the chart   I have made referrals, and provided counseling and education based on review of the above      Hyperlipidemia with target LDL less than  100    Given atherosclerosis noted on 2017 CT scan of chest   Goal is 70 on LDL.  Recent labs were done at work  And were above goal on 15 mg crestor but Daniel Lawrence wants to repeat after a few weeks of intense dietary changes.        Relevant Medications   sildenafil (VIAGRA) 50 MG tablet   Inguinal hernia    Asymptomatic.  Has seen Orvan Falconer at [place],  Liana Gerold options of incisional vs laparascopic .       Loss of libido    With decreased ability t sustain erection.  Viagra trial requested.       Melanoma in situ of right upper arm ([place])    Removed by his dermatologist this summer.  Continue 6 month checkups .vitamin D level needed       Other Visit Diagnoses     Need for immunization against influenza    -  Primary   Relevant Orders   Flu Vaccine QUAD 17mo+IM (Fluarix, Fluzone & Alfiuria Quad PF) (Completed)   Colon cancer screening       Relevant Orders   Ambulatory referral to Gastroenterology       I have discontinued Domenick Gong. Cress "[name]"'s  COVID-19 mRNA vaccine [employer]). I am also having him start on sildenafil. Additionally, I am having him maintain his loratadine, fluticasone, Vitamin D-3, Turmeric Curcumin, rosuvastatin, rosuvastatin, losartan, and Coenzyme Q10.  Meds ordered this encounter  Medications   sildenafil (VIAGRA) 50 MG tablet    Sig: Take 1 tablet (50 mg total) by mouth daily as needed for erectile dysfunction.    Dispense:  10 tablet    Refill:  0    Medications Discontinued During This Encounter  Medication Reason   COVID-19 mRNA vaccine, Pfizer, 30 MCG/0.3ML injection     Follow-up: No follow-ups on file.   [name], MD

## 2021-04-18 NOTE — Patient Instructions (Signed)
Colonoscopy referral in process  Generic Viagra starting dose 50 mg    Diprovan /incisional hernia repair gets my vote  A1c PSA and Vitamin D needed when we re do your labs   LDL goal is going to be 69 'ish given the atherosclerosis noted on your 2017 CT

## 2021-04-20 ENCOUNTER — Telehealth: Payer: Self-pay

## 2021-04-20 NOTE — Telephone Encounter (Signed)
Per Dr. Tarri Glenn request, called pt to schedule colon/LEC. LVM requesting returned call.

## 2021-04-20 NOTE — Telephone Encounter (Signed)
-----   Message from Thornton Park, MD sent at 04/18/2021 10:05 PM EDT ----- Phoebe Marter, I reviewed this patient's history in Epic. He is a candidate for colonoscopy in the [place]. Any chance we could arrange for a virtual pre-visit and expedite his colonoscopy? He has had a colonoscopy before so has a clue about what's going on.  KLB

## 2021-04-21 ENCOUNTER — Other Ambulatory Visit: Payer: Self-pay

## 2021-04-21 DIAGNOSIS — Z1211 Encounter for screening for malignant neoplasm of colon: Secondary | ICD-10-CM

## 2021-04-21 NOTE — Telephone Encounter (Signed)
Pt returned call. Scheduled for procedure as follows:  Appointment Information  Name: Daniel Lawrence, Daniel Lawrence" MRN: [id number]  Date: 05/03/2021 Status: Sch  Time: 2:00 PM Length: 30  Visit Type: PROPOFOL COLON [1272] Copay: $0.00  Provider: Thornton Park, MD Department: [facility]  Referring Provider: [name] CSN: [id number]  Notes: CRC screening/ae  Made On: Change Notes: 04/21/2021 9:36 AM 04/21/2021 9:39 AM By: By: Cheral Bay, Roxy Filler J   Given appraoching procedure date and the lack of available previsit appts, [name] CRNA notified to review pt chart to ensure there weren't any medical concerns that may prevent pt from having procedure scheduled at [facility]. Per [name], CRNA, pt is safe to proceed with being scheduled at [facility]. Prep instructions sent to pt My Chart per his request. Amb referral order placed for auth purposes.

## 2021-04-21 NOTE — Telephone Encounter (Signed)
Pt returned call. Confirmed he got my message about scheduling his colon but will need to have his wife call to schedule. States she will call to schedule to procedure. Will await her call.

## 2021-05-01 ENCOUNTER — Other Ambulatory Visit: Payer: Self-pay | Admitting: Internal Medicine

## 2021-05-03 ENCOUNTER — Encounter: Payer: Self-pay | Admitting: Gastroenterology

## 2021-05-03 ENCOUNTER — Other Ambulatory Visit: Payer: Self-pay

## 2021-05-03 ENCOUNTER — Ambulatory Visit (AMBULATORY_SURGERY_CENTER): Payer: BC Managed Care – PPO | Admitting: Gastroenterology

## 2021-05-03 VITALS — BP 109/77 | HR 67 | Temp 98.4°F | Resp 16 | Ht 69.0 in | Wt 196.0 lb

## 2021-05-03 DIAGNOSIS — D120 Benign neoplasm of cecum: Secondary | ICD-10-CM | POA: Diagnosis not present

## 2021-05-03 DIAGNOSIS — Z1211 Encounter for screening for malignant neoplasm of colon: Secondary | ICD-10-CM

## 2021-05-03 HISTORY — PX: COLONOSCOPY: SHX174

## 2021-05-03 MED ORDER — SODIUM CHLORIDE 0.9 % IV SOLN
500.0000 mL | Freq: Once | INTRAVENOUS | Status: DC
Start: 2021-05-03 — End: 2021-05-03
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
## 2021-05-03 NOTE — Progress Notes (Signed)
Called to room to assist during endoscopic procedure.  Patient ID and intended procedure confirmed with present staff. Received instructions for my participation in the procedure from the performing physician.  

## 2021-05-03 NOTE — Op Note (Signed)
Daniel Lawrence Name: Daniel Lawrence Procedure Date: 05/03/2021 2:13 PM MRN: [id number] Endoscopist: Thornton Park MD, MD Age: 60 Referring MD:  Date of Birth: [birth year] Gender: Male Account #: [id number] Procedure:                Colonoscopy Indications:              Screening for colorectal malignant neoplasm                           Normal colonoscopy in [place] >10 years ago                           No known family history of colon cancer or polyps Medicines:                Monitored Anesthesia Care Procedure:                Pre-Anesthesia Assessment:                           - Prior to the procedure, a History and Physical                            was performed, and Lawrence medications and                            allergies were reviewed. The Lawrence's tolerance of                            previous anesthesia was also reviewed. The risks                            and benefits of the procedure and the sedation                            options and risks were discussed with the Lawrence.                            All questions were answered, and informed consent                            was obtained. Prior Anticoagulants: The Lawrence has                            taken no previous anticoagulant or antiplatelet                            agents. ASA Grade Assessment: II - A Lawrence with                            mild systemic disease. After reviewing the risks                            and benefits, the Lawrence was deemed in  satisfactory condition to undergo the procedure.                           After obtaining informed consent, the colonoscope                            was passed under direct vision. Throughout the                            procedure, the Lawrence's blood pressure, pulse, and                            oxygen saturations were monitored continuously. The                            CF [id number]  #[id number] was introduced through the anus                            and advanced to the 4 cm into the ileum. A second                            forward view of the right colon was performed. The                            colonoscopy was performed without difficulty. The                            Lawrence tolerated the procedure well. The quality                            of the bowel preparation was adequate to identify                            polyps. 1066mL of liquid brown stool was lavaged                            during the procedure. The terminal ileum, ileocecal                            valve, appendiceal orifice, and rectum were                            photographed. Scope In: 2:31:21 PM Scope Out: 2:47:35 PM Scope Withdrawal Time: 0 hours 10 minutes 57 seconds  Total Procedure Duration: 0 hours 16 minutes 14 seconds  Findings:                 The perianal and digital rectal examinations were                            normal.                           Non-bleeding internal hemorrhoids were found.  A few small and large-mouthed diverticula were                            found in the sigmoid colon and descending colon.                           A 4 mm polyp was found in the cecum. The polyp was                            sessile. The polyp was removed with a cold snare.                            Resection and retrieval were complete. Estimated                            blood loss was minimal.                           A moderate amount of liquid stool was found in the                            entire colon. This could be lavaged during the                            procedure.                           The exam was otherwise without abnormality on                            direct and retroflexion views. Complications:            No immediate complications. Estimated blood loss:                            Minimal. Estimated Blood Loss:      Estimated blood loss was minimal. Impression:               - Non-bleeding internal hemorrhoids.                           - Diverticulosis in the sigmoid colon and in the                            descending colon.                           - One 4 mm polyp in the cecum, removed with a cold                            snare. Resected and retrieved.                           - Stool in the entire examined colon.                           -  The examination was otherwise normal on direct                            and retroflexion views. Recommendation:           - Lawrence has a contact number available for                            emergencies. The signs and symptoms of potential                            delayed complications were discussed with the                            Lawrence. Return to normal activities tomorrow.                            Written discharge instructions were provided to the                            Lawrence.                           - Resume previous diet.                           - Continue present medications.                           - Await pathology results.                           - Repeat colonoscopy date to be determined after                            pending pathology results are reviewed for                            surveillance. Use a different bowel prep or a 2 day                            bowel prep at that time.                           - Emerging evidence supports eating a diet of                            fruits, vegetables, grains, calcium, and yogurt                            while reducing red meat and alcohol may reduce the                            risk of colon cancer.                           -  Thank you for allowing me to be involved in your                            colon cancer prevention. Thornton Park MD, MD 05/03/2021 2:54:32 PM This report has been signed electronically.

## 2021-05-03 NOTE — Progress Notes (Signed)
PT taken to PACU. Monitors in place. VSS. Report given to RN. 

## 2021-05-03 NOTE — Patient Instructions (Signed)
Read all of the handouts given to you by your recovery room nurse.  YOU HAD AN ENDOSCOPIC PROCEDURE TODAY AT [facility]:   Refer to the procedure report that was given to you for any specific questions about what was found during the examination.  If the procedure report does not answer your questions, please call your gastroenterologist to clarify.  If you requested that your care partner not be given the details of your procedure findings, then the procedure report has been included in a sealed envelope for you to review at your convenience later.  YOU SHOULD EXPECT: Some feelings of bloating in the abdomen. Passage of more gas than usual.  Walking can help get rid of the air that was put into your GI tract during the procedure and reduce the bloating. If you had a lower endoscopy (such as a colonoscopy or flexible sigmoidoscopy) you may notice spotting of blood in your stool or on the toilet paper. If you underwent a bowel prep for your procedure, you may not have a normal bowel movement for a few days.  Please Note:  You might notice some irritation and congestion in your nose or some drainage.  This is from the oxygen used during your procedure.  There is no need for concern and it should clear up in a day or so.  SYMPTOMS TO REPORT IMMEDIATELY:  Following lower endoscopy (colonoscopy or flexible sigmoidoscopy):  Excessive amounts of blood in the stool  Significant tenderness or worsening of abdominal pains  Swelling of the abdomen that is new, acute  Fever of 100F or higher    For urgent or emergent issues, a gastroenterologist can be reached at any hour by calling [phone]. Do not use MyChart messaging for urgent concerns.    DIET:  We do recommend a small meal at first, but then you may proceed to your regular diet.  Drink plenty of fluids but you should avoid alcoholic beverages for 24 hours. Try to eat a high fiber diet, and drink plenty of water.  Stay away  from red meat.  ACTIVITY:  You should plan to take it easy for the rest of today and you should NOT DRIVE or use heavy machinery until tomorrow (because of the sedation medicines used during the test).    FOLLOW UP: Our staff will call the number listed on your records 48-72 hours following your procedure to check on you and address any questions or concerns that you may have regarding the information given to you following your procedure. If we do not reach you, we will leave a message.  We will attempt to reach you two times.  During this call, we will ask if you have developed any symptoms of COVID 19. If you develop any symptoms (ie: fever, flu-like symptoms, shortness of breath, cough etc.) before then, please call [phone].  If you test positive for Covid 19 in the 2 weeks post procedure, please call and report this information to [place].    If any biopsies were taken you will be contacted by phone or by letter within the next 1-3 weeks.  Please call us at [phone] if you have not heard about the biopsies in 3 weeks.    SIGNATURES/CONFIDENTIALITY: You and/or your care partner have signed paperwork which will be entered into your electronic medical record.  These signatures attest to the fact that that the information above on your After Visit Summary has been reviewed and is understood.  Full  responsibility of the confidentiality of this discharge information lies with you and/or your care-partner.  

## 2021-05-05 DIAGNOSIS — F419 Anxiety disorder, unspecified: Secondary | ICD-10-CM | POA: Diagnosis not present

## 2021-05-08 ENCOUNTER — Encounter: Payer: Self-pay | Admitting: Gastroenterology

## 2021-05-10 ENCOUNTER — Other Ambulatory Visit: Payer: Self-pay

## 2021-05-12 ENCOUNTER — Other Ambulatory Visit: Payer: Self-pay

## 2021-05-12 MED ORDER — SILDENAFIL CITRATE 50 MG PO TABS
50.0000 mg | ORAL_TABLET | Freq: Every day | ORAL | 2 refills | Status: DC | PRN
  Filled 2021-05-12: qty 6, 30d supply, fill #0
  Filled 2021-07-25: qty 30, 90d supply, fill #1
  Filled 2022-04-25: qty 10, 10d supply, fill #2

## 2021-05-15 ENCOUNTER — Other Ambulatory Visit (HOSPITAL_BASED_OUTPATIENT_CLINIC_OR_DEPARTMENT_OTHER): Payer: Self-pay

## 2021-05-15 ENCOUNTER — Other Ambulatory Visit: Payer: Self-pay

## 2021-05-17 ENCOUNTER — Other Ambulatory Visit: Payer: Self-pay | Admitting: Internal Medicine

## 2021-05-17 ENCOUNTER — Ambulatory Visit
Admission: EM | Admit: 2021-05-17 | Discharge: 2021-05-17 | Disposition: A | Discharge status: Home / Self Care | Payer: BC Managed Care – PPO

## 2021-05-17 ENCOUNTER — Other Ambulatory Visit: Payer: Self-pay

## 2021-05-17 DIAGNOSIS — R590 Localized enlarged lymph nodes: Secondary | ICD-10-CM

## 2021-05-17 NOTE — Discharge Instructions (Addendum)
Follow up with PCP for further investigation  Try warm compresses 3-5 times daily to help with swelling  If you begin to notice any shortness of breath, uncontrolled fever, worse headache of your life please present to the emergency department immediately for investigation.

## 2021-05-17 NOTE — ED Provider Notes (Signed)
Chief Complaint  Patient presents with   Abscess     Subjective/HPI:  Daniel Lawrence is a very pleasant 60 y.o. male concerned for abscess to right side of neck which started three weeks ago. No interventions or treatments at home. No pain.  ROS: General/Constitutional: No fevers, chills, or night sweats Skin: See HPI Musculoskeletal: No weakness, myalgias, arthralgias, joint swelling, locking popping or limitations in joint range of motion Peripheral Vascular: No edema, numbness, discoloration, pain, or paresthesias Lymph: No swelling, red streaks or swollen lymph nodes  OBJECTIVE:  Vitals:   05/17/21 1527  BP: (!) 147/88  Pulse: 77  Resp: 16  Temp: 97.9 F (36.6 C)  SpO2: 98%    General:   Alert and oriented, in no acute distress  Lesion Description: Swelling noted to lymph node of right posterior neck measuring approximately 3 cm x 2 cm.  Area is nontender, soft and immobile. Lymph:   No lymphadenopathy, red streaking, redness, pain or swelling noted Musculoskeletal:   No involvement of deep tissue, tendon or bony deformity, full range of motion, strength normal Extremities:   No edema, cyanosis Vascular: Normal perfusion, no arterial or venous injury Neurological: Full range of motion, sensation intact, reflexes normal, strength normal     Assessment:   1. Lymphadenopathy of right cervical region   Plan:      MDM: Patient presents with abscess to right side of neck which started three weeks ago. No interventions or treatments at home. No pain.  Given symptoms along with assessment findings, concern for lymphadenopathy.  Did advise the patient that he should follow-up with his primary care as the area is nontender, soft and immobile and will likely need imaging if the area does not improve with warm compresses.  The patient has no additional symptoms today and vital signs are stable, as such I am not concerned that he needs to present to the emergency department for  further investigation today.  Did advise that if he begins to have shortness of breath, uncontrolled fever, worst headache of his life that he will need to go to the emergency department.  Patient verbalized understanding and agreed with plan.  Patient stable upon discharge.  Return as needed.    Discharge Instructions      Follow up with PCP for further investigation  Try warm compresses 3-5 times daily to help with swelling  If you begin to notice any shortness of breath, uncontrolled fever, worse headache of your life please present to the emergency department immediately for investigation.         Serafina Royals, [place] 05/17/21 [phone]

## 2021-05-17 NOTE — ED Triage Notes (Signed)
Patient presents to Urgent Care with complaints of a abscess noted on her right side of neck x 3 weeks ago. Has not applied anything to site.   Denies fever or pain.

## 2021-05-23 ENCOUNTER — Other Ambulatory Visit: Payer: Self-pay

## 2021-05-23 ENCOUNTER — Encounter: Payer: Self-pay | Admitting: Family

## 2021-05-23 ENCOUNTER — Ambulatory Visit (INDEPENDENT_AMBULATORY_CARE_PROVIDER_SITE_OTHER): Payer: BC Managed Care – PPO | Admitting: Family

## 2021-05-23 VITALS — BP 116/78 | HR 78 | Temp 96.3°F | Ht 69.02 in | Wt 197.2 lb

## 2021-05-23 DIAGNOSIS — R221 Localized swelling, mass and lump, neck: Secondary | ICD-10-CM | POA: Diagnosis not present

## 2021-05-23 DIAGNOSIS — R6882 Decreased libido: Secondary | ICD-10-CM

## 2021-05-23 DIAGNOSIS — E785 Hyperlipidemia, unspecified: Secondary | ICD-10-CM | POA: Diagnosis not present

## 2021-05-24 LAB — CBC WITH DIFFERENTIAL/PLATELET
Basophils Absolute: 0.1 10*3/uL (ref 0.0–0.1)
Basophils Relative: 1.4 % (ref 0.0–3.0)
Eosinophils Absolute: 0.1 10*3/uL (ref 0.0–0.7)
Eosinophils Relative: 2.4 % (ref 0.0–5.0)
HCT: 47.8 % (ref 39.0–52.0)
Hemoglobin: 16.1 g/dL (ref 13.0–17.0)
Lymphocytes Relative: 25 % (ref 12.0–46.0)
Lymphs Abs: 1.2 10*3/uL (ref 0.7–4.0)
MCHC: 33.6 g/dL (ref 30.0–36.0)
MCV: 90.3 fl (ref 78.0–100.0)
Monocytes Absolute: 0.5 10*3/uL (ref 0.1–1.0)
Monocytes Relative: 10.8 % (ref 3.0–12.0)
Neutro Abs: 2.9 10*3/uL (ref 1.4–7.7)
Neutrophils Relative %: 60.4 % (ref 43.0–77.0)
Platelets: 164 10*3/uL (ref 150.0–400.0)
RBC: 5.29 Mil/uL (ref 4.22–5.81)
RDW: 12.9 % (ref 11.5–15.5)
WBC: 4.9 10*3/uL (ref 4.0–10.5)

## 2021-05-24 LAB — LIPID PANEL
Cholesterol: 204 mg/dL — ABNORMAL HIGH (ref 0–200)
HDL: 47.3 mg/dL (ref >39.00)
LDL Cholesterol: 117 mg/dL — ABNORMAL HIGH (ref 0–99)
NonHDL: 156.38
Total CHOL/HDL Ratio: 4
Triglycerides: 195 mg/dL — ABNORMAL HIGH (ref 0.0–149.0)
VLDL: 39 mg/dL (ref 0.0–40.0)

## 2021-05-24 LAB — COMPREHENSIVE METABOLIC PANEL
ALT: 21 U/L (ref 0–53)
AST: 20 U/L (ref 0–37)
Albumin: 4.7 g/dL (ref 3.5–5.2)
Alkaline Phosphatase: 49 U/L (ref 39–117)
BUN: 18 mg/dL (ref 6–23)
CO2: 29 mEq/L (ref 19–32)
Calcium: 9.6 mg/dL (ref 8.4–10.5)
Chloride: 100 mEq/L (ref 96–112)
Creatinine, Ser: 1.48 mg/dL (ref 0.40–1.50)
GFR: 50.52 mL/min — ABNORMAL LOW (ref >60.00)
Glucose, Bld: 74 mg/dL (ref 70–99)
Potassium: 3.9 mEq/L (ref 3.5–5.1)
Sodium: 138 mEq/L (ref 135–145)
Total Bilirubin: 0.8 mg/dL (ref 0.2–1.2)
Total Protein: 7.4 g/dL (ref 6.0–8.3)
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
## 2021-05-26 ENCOUNTER — Other Ambulatory Visit: Payer: Self-pay

## 2021-05-26 ENCOUNTER — Ambulatory Visit
Admission: RE | Admit: 2021-05-26 | Discharge: 2021-05-26 | Disposition: A | Discharge status: Home / Self Care | Payer: BC Managed Care – PPO | Source: Ambulatory Visit | Attending: Family | Admitting: Family

## 2021-05-26 DIAGNOSIS — R221 Localized swelling, mass and lump, neck: Secondary | ICD-10-CM | POA: Insufficient documentation

## 2021-05-28 ENCOUNTER — Other Ambulatory Visit: Payer: Self-pay | Admitting: Family

## 2021-05-28 DIAGNOSIS — R221 Localized swelling, mass and lump, neck: Secondary | ICD-10-CM

## 2021-05-29 ENCOUNTER — Telehealth: Payer: Self-pay | Admitting: Internal Medicine

## 2021-05-29 NOTE — Telephone Encounter (Signed)
Spoke with pt to see what his question was about the [place] and the finding. The pt stated that he would like to know how accurate you think the radiologist is on what he has found on the neck US. Pt stated that he is going forward with the CT scan but was wondering how often it occurs that what the [place] says is inaccurate. Pt is nervous about the findings and is wanting so peace of mind about the mass that was found in his neck.

## 2021-05-29 NOTE — Telephone Encounter (Signed)
I spoke with pt to have scheduled for the CT. Pt has a question regarding the accuracy of the [place]. Please advise and Thank you!  Call pt at [phone].

## 2021-05-29 NOTE — Telephone Encounter (Signed)
LMTCB

## 2021-05-30 NOTE — Telephone Encounter (Signed)
Spoke with pt and read him the message below. Pt gave a verbal understanding.

## 2021-06-01 DIAGNOSIS — F419 Anxiety disorder, unspecified: Secondary | ICD-10-CM | POA: Diagnosis not present

## 2021-06-14 DIAGNOSIS — D485 Neoplasm of uncertain behavior of skin: Secondary | ICD-10-CM | POA: Diagnosis not present

## 2021-06-14 DIAGNOSIS — C44329 Squamous cell carcinoma of skin of other parts of face: Secondary | ICD-10-CM | POA: Diagnosis not present

## 2021-06-22 ENCOUNTER — Other Ambulatory Visit: Payer: Self-pay

## 2021-06-22 ENCOUNTER — Telehealth: Payer: Self-pay | Admitting: Internal Medicine

## 2021-06-22 ENCOUNTER — Ambulatory Visit
Admission: RE | Admit: 2021-06-22 | Discharge: 2021-06-22 | Disposition: A | Discharge status: Home / Self Care | Payer: BC Managed Care – PPO | Source: Ambulatory Visit | Attending: Family | Admitting: Family

## 2021-06-22 ENCOUNTER — Other Ambulatory Visit: Payer: Self-pay | Admitting: Family

## 2021-06-22 DIAGNOSIS — R221 Localized swelling, mass and lump, neck: Secondary | ICD-10-CM | POA: Diagnosis not present

## 2021-06-22 DIAGNOSIS — I6523 Occlusion and stenosis of bilateral carotid arteries: Secondary | ICD-10-CM | POA: Diagnosis not present

## 2021-06-22 DIAGNOSIS — M47812 Spondylosis without myelopathy or radiculopathy, cervical region: Secondary | ICD-10-CM | POA: Diagnosis not present

## 2021-06-22 DIAGNOSIS — D170 Benign lipomatous neoplasm of skin and subcutaneous tissue of head, face and neck: Secondary | ICD-10-CM

## 2021-06-22 DIAGNOSIS — M40202 Unspecified kyphosis, cervical region: Secondary | ICD-10-CM | POA: Diagnosis not present

## 2021-06-22 MED ORDER — IOHEXOL 300 MG/ML  SOLN
75.0000 mL | Freq: Once | INTRAMUSCULAR | Status: AC | PRN
  Administered 2021-06-22: 75 mL via INTRAVENOUS

## 2021-06-22 NOTE — Telephone Encounter (Signed)
Pt called in stating that he received a call from our office. Pt didn't know what the call was for. Pt had CT imaging. Pt thinks the call maybe about his results. Pt requesting callback.

## 2021-06-23 NOTE — Telephone Encounter (Signed)
See result note message 

## 2021-06-29 ENCOUNTER — Other Ambulatory Visit: Payer: Self-pay

## 2021-06-29 ENCOUNTER — Ambulatory Visit (INDEPENDENT_AMBULATORY_CARE_PROVIDER_SITE_OTHER): Payer: BC Managed Care – PPO | Admitting: Surgery

## 2021-06-29 ENCOUNTER — Encounter: Payer: Self-pay | Admitting: Surgery

## 2021-06-29 VITALS — BP 125/80 | HR 70 | Temp 98.3°F | Ht 69.0 in | Wt 196.0 lb

## 2021-06-29 DIAGNOSIS — M7989 Other specified soft tissue disorders: Secondary | ICD-10-CM | POA: Diagnosis not present

## 2021-06-29 DIAGNOSIS — R221 Localized swelling, mass and lump, neck: Secondary | ICD-10-CM | POA: Diagnosis not present

## 2021-06-29 DIAGNOSIS — C801 Malignant (primary) neoplasm, unspecified: Secondary | ICD-10-CM | POA: Insufficient documentation

## 2021-06-29 NOTE — Patient Instructions (Addendum)
We recommend you seeing a sarcoma specialists for follow up of this mass and determining if surgery is needed. If you need [place] to assist with this just let us know and we will send the referral and associated imaging and notes.   If you need [place] to do the referral we will need the physician name and a phone number for contact.
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
## 2021-06-30 ENCOUNTER — Telehealth: Payer: Self-pay

## 2021-06-30 DIAGNOSIS — F419 Anxiety disorder, unspecified: Secondary | ICD-10-CM | POA: Diagnosis not present

## 2021-06-30 NOTE — Telephone Encounter (Signed)
Patient called and said he needed [place] to send a referral to [facility]. He provided the fax and phone number.  All records have been sent and images Power Shared to the [facility].

## 2021-07-18 DIAGNOSIS — D481 Neoplasm of uncertain behavior of connective and other soft tissue: Secondary | ICD-10-CM | POA: Diagnosis not present

## 2021-07-22 DIAGNOSIS — C493 Malignant neoplasm of connective and soft tissue of thorax: Secondary | ICD-10-CM | POA: Insufficient documentation

## 2021-07-22 DIAGNOSIS — D4819 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: Secondary | ICD-10-CM | POA: Insufficient documentation

## 2021-07-25 ENCOUNTER — Other Ambulatory Visit: Payer: Self-pay

## 2021-07-26 ENCOUNTER — Other Ambulatory Visit: Payer: Self-pay

## 2021-07-26 DIAGNOSIS — D4101 Neoplasm of uncertain behavior of right kidney: Secondary | ICD-10-CM | POA: Diagnosis not present

## 2021-08-03 DIAGNOSIS — C44629 Squamous cell carcinoma of skin of left upper limb, including shoulder: Secondary | ICD-10-CM | POA: Diagnosis not present

## 2021-08-04 DIAGNOSIS — F419 Anxiety disorder, unspecified: Secondary | ICD-10-CM | POA: Diagnosis not present

## 2021-08-15 DIAGNOSIS — L821 Other seborrheic keratosis: Secondary | ICD-10-CM | POA: Diagnosis not present

## 2021-08-15 DIAGNOSIS — D225 Melanocytic nevi of trunk: Secondary | ICD-10-CM | POA: Diagnosis not present

## 2021-08-15 DIAGNOSIS — L578 Other skin changes due to chronic exposure to nonionizing radiation: Secondary | ICD-10-CM | POA: Diagnosis not present

## 2021-08-15 DIAGNOSIS — Z23 Encounter for immunization: Secondary | ICD-10-CM | POA: Diagnosis not present

## 2021-08-15 DIAGNOSIS — L570 Actinic keratosis: Secondary | ICD-10-CM | POA: Diagnosis not present

## 2021-09-01 DIAGNOSIS — F419 Anxiety disorder, unspecified: Secondary | ICD-10-CM | POA: Diagnosis not present

## 2021-09-13 NOTE — Progress Notes (Signed)
?Charlann Boxer D.O. ?[facility] ?[place] ?Phone: [phone] ?Subjective:   ?I, [name], am serving as a Education administrator for Dr. Hulan Saas. ?This visit occurred during the SARS-CoV-2 public health emergency.  Safety protocols were in place, including screening questions prior to the visit, additional usage of staff PPE, and extensive cleaning of exam room while observing appropriate contact time as indicated for disinfecting solutions.  ? ?I'm seeing this patient by the request  of:  [name], MD ? ?CC: Bilateral knee pain, back pain ? ?[id number]  ?Last seen in 2019 for left knee pain ? ?Updated 09/14/2021 ?Daniel Lawrence is a 61 y.o. male coming in with complaint of right knee pain. Sciatica in right leg for the last 6-8 weeks. Wants to confirm that it is sciatica and would like a plan. Knees have been manageable, but would like a re-evaluation of both. ? ? ? ?  ? ?Past Medical History:  ?Diagnosis Date  ? Allergy   ? Arthritis   ? left knee  ? Cancer [facility])   ? skin cancer  ? Cataract   ? Hyperlipidemia   ? Hypertension   ? ?Past Surgical History:  ?Procedure Laterality Date  ? BRAIN SURGERY    ? ELEVATION OF DEPRESSED SKULL FRACTURE  1971  ? traumatic blow to head by golf club   , [facility])  ? MOHS SURGERY  March 2013  ? left temple, squamous  ? ROBOT ASSISTED LAPAROSCOPIC NEPHRECTOMY Right 04/20/2016  ? Procedure: XI ROBOTIC ASSISTED LAPAROSCOPIC RADICAL NEPHRECTOMY;  Surgeon: Alexis Frock, MD;  Location: [facility];  Service: Urology;  Laterality: Right;  ? ?Social History  ? ?Socioeconomic History  ? Marital status: Married  ?  Spouse name: Not on file  ? Number of children: Not on file  ? Years of education: Not on file  ? Highest education level: Not on file  ?Occupational History  ? Not on file  ?Tobacco Use  ? Smoking status: Never  ? Smokeless tobacco: Never  ?Vaping Use  ? Vaping Use: Never used  ?Substance and Sexual Activity  ? Alcohol use: Yes  ?   Alcohol/week: 6.0 standard drinks  ?  Types: 6 Glasses of wine per week  ? Drug use: No  ? Sexual activity: Not on file  ?Other Topics Concern  ? Not on file  ?Social History Narrative  ? Not on file  ? ?Social Determinants of Health  ? ?Financial Resource Strain: Not on file  ?Food Insecurity: Not on file  ?Transportation Needs: Not on file  ?Physical Activity: Not on file  ?Stress: Not on file  ?Social Connections: Not on file  ? ?Allergies  ?Allergen Reactions  ? Sulfa Antibiotics Rash  ? ?Family History  ?Problem Relation Age of Onset  ? Alzheimer's disease Mother   ? Heart disease Father   ?     after age 72  ? Alzheimer's disease Maternal Aunt   ? Alzheimer's disease Maternal Uncle   ? Cancer Maternal Grandmother   ?     breast  ? Cancer Maternal Grandfather   ?     lung,  tobacco abuse  ? Colon cancer Neg Hx   ? Esophageal cancer Neg Hx   ? Stomach cancer Neg Hx   ? ? ? ?Current Outpatient Medications (Cardiovascular):  ?  losartan (COZAAR) 50 MG tablet, TAKE 1 TABLET BY MOUTH EVERY DAY ?  rosuvastatin (CRESTOR) 10 MG tablet, TAKE 1 TABLET BY  MOUTH EVERY DAY ?  rosuvastatin (CRESTOR) 5 MG tablet, TAKE 1 TABLET (5 MG TOTAL) BY MOUTH DAILY. ?  sildenafil (VIAGRA) 50 MG tablet, Take 1 tablet (50 mg total) by mouth daily as needed for erectile dysfunction. ? ?Current Outpatient Medications (Respiratory):  ?  fluticasone (FLONASE) 50 MCG/ACT nasal spray, Place 2 sprays into both nostrils daily. (Patient taking differently: Place 1 spray into both nostrils daily.) ?  loratadine (CLARITIN) 10 MG tablet, Take 10 mg by mouth daily as needed for allergies.  ? ? ? ?Current Outpatient Medications (Other):  ?  Black Pepper-Turmeric (TURMERIC CURCUMIN) 11-998 MG CAPS,  ?  Cholecalciferol (VITAMIN D-3) 1000 units CAPS,  ?  Coenzyme Q10 100 MG capsule, Take 100 mg by mouth daily. ? ? ?Reviewed prior external information including notes and imaging from  ?primary care provider ?As well as notes that were available from care  everywhere and other healthcare systems. ? ?Past medical history, social, surgical and family history all reviewed in electronic medical record.  No pertanent information unless stated regarding to the chief complaint.  ? ?Review of Systems: ? No headache, visual changes, nausea, vomiting, diarrhea, constipation, dizziness, abdominal pain, skin rash, fevers, chills, night sweats, weight loss, swollen lymph nodes, body aches, joint swelling, chest pain, shortness of breath, mood changes. POSITIVE muscle aches ? ?Objective  ?Blood pressure 128/82, pulse 80, height 5\' 9"  (1.753 m), weight 195 lb (88.5 kg), SpO2 96 %. ?  ?General: No apparent distress alert and oriented x3 mood and affect normal, dressed appropriately.  ?HEENT: Pupils equal, extraocular movements intact lipoma noted of the right trapezius area. ?Respiratory: Patient's speak in full sentences and does not appear short of breath  ?Cardiovascular: No lower extremity edema, non tender, no erythema  ?Gait antalgic ?Patient is a varus deformity of the knees bilaterally.  Instability noted with valgus and varus force.  Tightness noted with a straight leg at the moment.  Back exam very mild loss of lordosis.  Tightness noted with FABER test.  Neurovascular intact. ? ? ?Limited muscular skeletal ultrasound was performed and interpreted by Hulan Saas, M  ?Limited musculoskeletal ultrasound shows the patient does have some narrowing of the joint space.  Trace effusion noted of the patellofemoral joint bilaterally. ?Impression:arthritis  ? ?89169; 15 additional minutes spent for Therapeutic exercises as stated in above notes.  This included exercises focusing on stretching, strengthening, with significant focus on eccentric aspects.   Long term goals include an improvement in range of motion, strength, endurance as well as avoiding reinjury. Patient's frequency would include in 1-2 times a day, 3-5 times a week for a duration of 6-12 weeks.  Low back exercises  that included:  ?Pelvic tilt/bracing instruction to focus on control of the pelvic girdle and lower abdominal muscles  ?Glute strengthening exercises, focusing on proper firing of the glutes without engaging the low back muscles ?Proper stretching techniques for maximum relief for the hamstrings, hip flexors, low back and some rotation where tolerated ?Proper technique shown and discussed handout in great detail with ATC.  All questions were discussed and answered.  ? ? ?  ?Impression and Recommendations:  ?  ? ?The above documentation has been reviewed and is accurate and complete [name], DO ? ? ? ?

## 2021-09-14 ENCOUNTER — Ambulatory Visit: Payer: Self-pay

## 2021-09-14 ENCOUNTER — Ambulatory Visit (INDEPENDENT_AMBULATORY_CARE_PROVIDER_SITE_OTHER): Payer: BC Managed Care – PPO | Admitting: Family Medicine

## 2021-09-14 ENCOUNTER — Ambulatory Visit (INDEPENDENT_AMBULATORY_CARE_PROVIDER_SITE_OTHER): Payer: BC Managed Care – PPO

## 2021-09-14 ENCOUNTER — Other Ambulatory Visit: Payer: Self-pay

## 2021-09-14 VITALS — BP 128/82 | HR 80 | Ht 69.0 in | Wt 195.0 lb

## 2021-09-14 DIAGNOSIS — M25562 Pain in left knee: Secondary | ICD-10-CM | POA: Diagnosis not present

## 2021-09-14 DIAGNOSIS — M5441 Lumbago with sciatica, right side: Secondary | ICD-10-CM

## 2021-09-14 DIAGNOSIS — M25561 Pain in right knee: Secondary | ICD-10-CM | POA: Diagnosis not present

## 2021-09-14 DIAGNOSIS — M1712 Unilateral primary osteoarthritis, left knee: Secondary | ICD-10-CM | POA: Diagnosis not present

## 2021-09-14 DIAGNOSIS — G8929 Other chronic pain: Secondary | ICD-10-CM

## 2021-09-14 NOTE — Patient Instructions (Addendum)
Xray today ?Tart Cherry 1000-1200mg  ?Do prescribed exercises at least 3x a week ?Otherwise stay active and keep working on weight loss ?See you again in 6-8 weeks ?

## 2021-09-15 DIAGNOSIS — M545 Low back pain, unspecified: Secondary | ICD-10-CM | POA: Insufficient documentation

## 2021-09-15 NOTE — Assessment & Plan Note (Signed)
Patient does give a episode of potential radiculopathy.  Discussed with patient about icing regimen and home exercises, discussed which activities to do and which ones to avoid.  Once to start with home exercises which was given to him by athletic trainer.  Follow-up again in 6 weeks ?

## 2021-09-15 NOTE — Assessment & Plan Note (Signed)
Arthritic changes of the knees bilaterally.  Discussed the possibility of injections.  Discussed icing regimen and home exercises.  Patient feels like he is doing relatively well at the moment. Discussed HEP we will get bilateral standing x-rays.  Follow-up again in 4 to 6 weeks.  Worsening pain consider formal physical therapy or injections. ?

## 2021-09-29 DIAGNOSIS — F419 Anxiety disorder, unspecified: Secondary | ICD-10-CM | POA: Diagnosis not present

## 2021-10-27 DIAGNOSIS — F419 Anxiety disorder, unspecified: Secondary | ICD-10-CM | POA: Diagnosis not present

## 2021-10-29 ENCOUNTER — Other Ambulatory Visit: Payer: Self-pay | Admitting: Internal Medicine

## 2021-11-01 NOTE — Progress Notes (Signed)
?Charlann Boxer D.O. ?[facility] ?[place] ?Phone: [phone] ?Subjective:   ?I, Daniel Lawrence, am serving as a scribe for Dr. Hulan Saas. ? ?This visit occurred during the SARS-CoV-2 public health emergency.  Safety protocols were in place, including screening questions prior to the visit, additional usage of staff PPE, and extensive cleaning of exam room while observing appropriate contact time as indicated for disinfecting solutions.  ?I'm seeing this patient by the request  of:  [name], MD ? ?CC: Low back pain left knee pain ? ?EPP:[id number]  ?09/14/2021 ?Patient does give a episode of potential radiculopathy.  Discussed with patient about icing regimen and home exercises, discussed which activities to do and which ones to avoid.  Once to start with home exercises which was given to him by athletic trainer.  Follow-up again in 6 weeks ? ?Arthritic changes of the knees bilaterally.  Discussed the possibility of injections.  Discussed icing regimen and home exercises.  Patient feels like he is doing relatively well at the moment. Discussed HEP we will get bilateral standing x-rays.  Follow-up again in 4 to 6 weeks.  Worsening pain consider formal physical therapy or injections. ? ?Updated 11/02/2021 ?Daniel Lawrence is a 60 y.o. male coming in with complaint of LBP and left knee pain. Patient states that he continues to have sciatic nerve pain in R leg that is occurring more frequently. Would like to go to PT to be held accountable. Does not want to focus on knee pain at this time as his R hip and leg pain is more prevalent.  ? ? ? ?  ? ?Past Medical History:  ?Diagnosis Date  ? Allergy   ? Arthritis   ? left knee  ? Cancer [facility])   ? skin cancer  ? Cataract   ? Hyperlipidemia   ? Hypertension   ? ?Past Surgical History:  ?Procedure Laterality Date  ? BRAIN SURGERY    ? ELEVATION OF DEPRESSED SKULL FRACTURE  1971  ? traumatic blow to head by golf club   , [facility])   ? MOHS SURGERY  March 2013  ? left temple, squamous  ? ROBOT ASSISTED LAPAROSCOPIC NEPHRECTOMY Right 04/20/2016  ? Procedure: XI ROBOTIC ASSISTED LAPAROSCOPIC RADICAL NEPHRECTOMY;  Surgeon: Alexis Frock, MD;  Location: [facility];  Service: Urology;  Laterality: Right;  ? ?Social History  ? ?Socioeconomic History  ? Marital status: Married  ?  Spouse name: Not on file  ? Number of children: Not on file  ? Years of education: Not on file  ? Highest education level: Not on file  ?Occupational History  ? Not on file  ?Tobacco Use  ? Smoking status: Never  ? Smokeless tobacco: Never  ?Vaping Use  ? Vaping Use: Never used  ?Substance and Sexual Activity  ? Alcohol use: Yes  ?  Alcohol/week: 6.0 standard drinks  ?  Types: 6 Glasses of wine per week  ? Drug use: No  ? Sexual activity: Not on file  ?Other Topics Concern  ? Not on file  ?Social History Narrative  ? Not on file  ? ?Social Determinants of Health  ? ?Financial Resource Strain: Not on file  ?Food Insecurity: Not on file  ?Transportation Needs: Not on file  ?Physical Activity: Not on file  ?Stress: Not on file  ?Social Connections: Not on file  ? ?Allergies  ?Allergen Reactions  ? Sulfa Antibiotics Rash  ? ?Family History  ?Problem Relation Age of  Onset  ? Alzheimer's disease Mother   ? Heart disease Father   ?     after age 25  ? Alzheimer's disease Maternal Aunt   ? Alzheimer's disease Maternal Uncle   ? Cancer Maternal Grandmother   ?     breast  ? Cancer Maternal Grandfather   ?     lung,  tobacco abuse  ? Colon cancer Neg Hx   ? Esophageal cancer Neg Hx   ? Stomach cancer Neg Hx   ? ? ? ?Current Outpatient Medications (Cardiovascular):  ?  losartan (COZAAR) 50 MG tablet, TAKE 1 TABLET BY MOUTH EVERY DAY ?  rosuvastatin (CRESTOR) 10 MG tablet, TAKE 1 TABLET BY MOUTH EVERY DAY ?  rosuvastatin (CRESTOR) 5 MG tablet, TAKE 1 TABLET (5 MG TOTAL) BY MOUTH DAILY. ?  sildenafil (VIAGRA) 50 MG tablet, Take 1 tablet (50 mg total) by mouth daily as needed for erectile  dysfunction. ? ?Current Outpatient Medications (Respiratory):  ?  fluticasone (FLONASE) 50 MCG/ACT nasal spray, Place 2 sprays into both nostrils daily. (Patient taking differently: Place 1 spray into both nostrils daily.) ?  loratadine (CLARITIN) 10 MG tablet, Take 10 mg by mouth daily as needed for allergies.  ? ? ? ?Current Outpatient Medications (Other):  ?  Black Pepper-Turmeric (TURMERIC CURCUMIN) 11-998 MG CAPS,  ?  Cholecalciferol (VITAMIN D-3) 1000 units CAPS,  ?  Coenzyme Q10 100 MG capsule, Take 100 mg by mouth daily. ?  gabapentin (NEURONTIN) 100 MG capsule, Take 2 capsules (200 mg total) by mouth at bedtime. ? ? ?Reviewed prior external information including notes and imaging from  ?primary care provider ?As well as notes that were available from care everywhere and other healthcare systems. ? ?Past medical history, social, surgical and family history all reviewed in electronic medical record.  No pertanent information unless stated regarding to the chief complaint.  ? ?Review of Systems: ? No headache, visual changes, nausea, vomiting, diarrhea, constipation, dizziness, abdominal pain, skin rash, fevers, chills, night sweats, weight loss, swollen lymph nodes, body aches, joint swelling, chest pain, shortness of breath, mood changes. POSITIVE muscle aches ? ?Objective  ?Blood pressure 130/82, pulse 72, height '5\' 9"'$  (1.753 m), weight 197 lb (89.4 kg), SpO2 98 %. ?  ?General: No apparent distress alert and oriented x3 mood and affect normal, dressed appropriately.  ?HEENT: Pupils equal, extraocular movements intact  ?Respiratory: Patient's speak in full sentences and does not appear short of breath  ?Cardiovascular: No lower extremity edema, non tender, no erythema  ?Gait severely antalgic gait ?Patient has less than 5 degrees of extension of the back.  Patient does have a mild positive straight leg test on the right side.  4-5 strength of dorsiflexion on the right compared to left.  Patient does have  significant varus deformity of the knees bilaterally.  Deep tendon reflexes do appear to be intact ? ?  ?Impression and Recommendations:  ?  ? ?The above documentation has been reviewed and is accurate and complete [name], DO ? ? ?

## 2021-11-02 ENCOUNTER — Ambulatory Visit (INDEPENDENT_AMBULATORY_CARE_PROVIDER_SITE_OTHER): Payer: BC Managed Care – PPO

## 2021-11-02 ENCOUNTER — Encounter: Payer: Self-pay | Admitting: Family Medicine

## 2021-11-02 ENCOUNTER — Ambulatory Visit (INDEPENDENT_AMBULATORY_CARE_PROVIDER_SITE_OTHER): Payer: BC Managed Care – PPO | Admitting: Family Medicine

## 2021-11-02 ENCOUNTER — Ambulatory Visit: Payer: Self-pay

## 2021-11-02 VITALS — BP 130/82 | HR 72 | Ht 69.0 in | Wt 197.0 lb

## 2021-11-02 DIAGNOSIS — M25562 Pain in left knee: Secondary | ICD-10-CM

## 2021-11-02 DIAGNOSIS — M545 Low back pain, unspecified: Secondary | ICD-10-CM

## 2021-11-02 DIAGNOSIS — G8929 Other chronic pain: Secondary | ICD-10-CM

## 2021-11-02 DIAGNOSIS — M5441 Lumbago with sciatica, right side: Secondary | ICD-10-CM | POA: Diagnosis not present

## 2021-11-02 DIAGNOSIS — M25551 Pain in right hip: Secondary | ICD-10-CM | POA: Diagnosis not present

## 2021-11-02 DIAGNOSIS — R102 Pelvic and perineal pain: Secondary | ICD-10-CM | POA: Diagnosis not present

## 2021-11-02 MED ORDER — GABAPENTIN 100 MG PO CAPS: 200.0000 mg | ORAL_CAPSULE | Freq: Every day | ORAL | 0 refills | Status: DC

## 2021-11-02 NOTE — Assessment & Plan Note (Signed)
Patient is having low back pain with radicular symptoms.  X-rays of the lumbar spine do show that patient has some L5-S1 severe arthritic changes with facet arthropathy.  Patient will be started on gabapentin.  Due to the potential mild weakness also noted and patient's past medical history for multiple cancers I do think that it is worthwhile for [place] to get advanced imaging.  We will get MRI.  Depending on findings we will discuss possible epidurals as well.  Patient knows of significant worsening pain to seek medical attention immediately. ?

## 2021-11-02 NOTE — Patient Instructions (Signed)
PT referral placed ?MRI lumbar ?Gabapentin '200mg'$  at night ?See me in 6-8 weeks ?

## 2021-11-09 ENCOUNTER — Other Ambulatory Visit: Payer: BC Managed Care – PPO

## 2021-11-09 ENCOUNTER — Ambulatory Visit
Admission: RE | Admit: 2021-11-09 | Discharge: 2021-11-09 | Disposition: A | Discharge status: Home / Self Care | Payer: BC Managed Care – PPO | Source: Ambulatory Visit | Attending: Family Medicine | Admitting: Family Medicine

## 2021-11-09 DIAGNOSIS — R200 Anesthesia of skin: Secondary | ICD-10-CM | POA: Diagnosis not present

## 2021-11-09 DIAGNOSIS — M48061 Spinal stenosis, lumbar region without neurogenic claudication: Secondary | ICD-10-CM | POA: Diagnosis not present

## 2021-11-09 DIAGNOSIS — M545 Low back pain, unspecified: Secondary | ICD-10-CM

## 2021-11-09 DIAGNOSIS — M4316 Spondylolisthesis, lumbar region: Secondary | ICD-10-CM | POA: Diagnosis not present

## 2021-11-10 ENCOUNTER — Telehealth: Payer: Self-pay | Admitting: Family Medicine

## 2021-11-10 NOTE — Telephone Encounter (Signed)
Patient called stating that he went out of town this weekend and left his Gabapentin. He asked if a two day supply could be sent to: ?[facility] in [place]? ? ?Please advise. ? ?(He said that since he has been using it, he has started to notice some improvement and was hoping to not miss these two days.) ?

## 2021-11-12 ENCOUNTER — Other Ambulatory Visit: Payer: Self-pay | Admitting: Internal Medicine

## 2021-11-14 DIAGNOSIS — I10 Essential (primary) hypertension: Secondary | ICD-10-CM | POA: Diagnosis not present

## 2021-11-14 DIAGNOSIS — N183 Chronic kidney disease, stage 3 unspecified: Secondary | ICD-10-CM | POA: Diagnosis not present

## 2021-11-14 LAB — LIPID PANEL
Cholesterol: 188 (ref 0–200)
HDL: 44 (ref 35–70)
LDL Cholesterol: 109
Triglycerides: 201 — AB (ref 40–160)

## 2021-11-14 LAB — COMPREHENSIVE METABOLIC PANEL
Albumin: 4.5 (ref 3.5–5.0)
Calcium: 9.1 (ref 8.7–10.7)
eGFR: 54

## 2021-11-14 LAB — BASIC METABOLIC PANEL
BUN: 17 (ref 4–21)
CO2: 25 — AB (ref 13–22)
Chloride: 102 (ref 99–108)
Creatinine: 1.5 — AB (ref 0.6–1.3)
Glucose: 78
Potassium: 4.5 mEq/L (ref 3.5–5.1)
Sodium: 141 (ref 137–147)

## 2021-11-15 ENCOUNTER — Ambulatory Visit: Payer: BC Managed Care – PPO | Attending: Family Medicine | Admitting: Physical Therapy

## 2021-11-15 DIAGNOSIS — M5441 Lumbago with sciatica, right side: Secondary | ICD-10-CM | POA: Insufficient documentation

## 2021-11-15 DIAGNOSIS — M25562 Pain in left knee: Secondary | ICD-10-CM | POA: Insufficient documentation

## 2021-11-15 DIAGNOSIS — M545 Low back pain, unspecified: Secondary | ICD-10-CM | POA: Insufficient documentation

## 2021-11-15 DIAGNOSIS — M6281 Muscle weakness (generalized): Secondary | ICD-10-CM | POA: Diagnosis not present

## 2021-11-15 DIAGNOSIS — G8929 Other chronic pain: Secondary | ICD-10-CM | POA: Diagnosis not present

## 2021-11-18 NOTE — Therapy (Signed)
?OUTPATIENT PHYSICAL THERAPY THORACOLUMBAR EVALUATION ? ? ?Patient Name: Daniel Lawrence ?MRN: [id number] ?DOB:1961-04-07, 61 y.o., male ?Today's Date: 11/18/2021 ? ? PT End of Session - 11/18/21 1457   ? ? Visit Number 1   ? Number of Visits 9   ? Date for PT Re-Evaluation 01/10/22   ? PT Start Time [phone]   ? PT Stop Time 0949   ? PT Time Calculation (min) 53 min   ? Activity Tolerance Patient tolerated treatment well   ? Behavior During Therapy [facility] for tasks assessed/performed   ? ?  ?  ? ?  ? ? ?Past Medical History:  ?Diagnosis Date  ? Allergy   ? Arthritis   ? left knee  ? Cancer [facility])   ? skin cancer  ? Cataract   ? Hyperlipidemia   ? Hypertension   ? ?Past Surgical History:  ?Procedure Laterality Date  ? BRAIN SURGERY    ? ELEVATION OF DEPRESSED SKULL FRACTURE  1971  ? traumatic blow to head by golf club   , [facility])  ? MOHS SURGERY  March 2013  ? left temple, squamous  ? ROBOT ASSISTED LAPAROSCOPIC NEPHRECTOMY Right 04/20/2016  ? Procedure: XI ROBOTIC ASSISTED LAPAROSCOPIC RADICAL NEPHRECTOMY;  Surgeon: Daniel Frock, MD;  Location: [facility];  Service: Urology;  Laterality: Right;  ? ?Patient Active Problem List  ? Diagnosis Date Noted  ? Low back pain 09/15/2021  ? Cancer ([place]) 06/29/2021  ? Mass of soft tissue of shoulder 06/29/2021  ? Mass of soft tissue of neck 06/29/2021  ? Aortic atherosclerosis ([place]) 04/18/2021  ? Melanoma in situ of right upper arm ([place]) 04/18/2021  ? H/O right nephrectomy 04/17/2020  ? Loss of libido 04/14/2019  ? Degenerative arthritis of left knee 02/25/2018  ? Chronic pain of left knee 02/08/2018  ? CKD (chronic kidney disease) stage 3, GFR 30-59 ml/min (HCC) 02/08/2018  ? Coronary atherosclerosis due to lipid rich plaque 05/26/2016  ? History of renal cell carcinoma 04/20/2016  ? B12 deficiency 04/08/2016  ? Encounter for preventive health examination 06/24/2014  ? Hypertension 06/23/2014  ? 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 Chronic  right shoulder pain 06/28/2013  ? Inguinal hernia 12/10/2011  ? Hyperlipidemia with target LDL less than 100 12/10/2011  ? ? ?PCP: Daniel Medina, MD ? ?REFERRING PROVIDER: Hulan Saas, DO ? ?REFERRING DIAG: Lumbar spine pain/ L knee pain.   ? ?THERAPY DIAG:  ?Chronic bilateral low back pain with right-sided sciatica ? ?Muscle weakness (generalized) ? ?ONSET DATE: 07/16/21 ? ?SUBJECTIVE:                                                                                                                                                                                          ? ?  SUBJECTIVE STATEMENT: ?Pt. States he fell off a ladder 30 years ago resulting in compression fractures of spine.  No surgery.  Pt. Reports he has L medial knee deterioration.  R distal hamstring pain (sciatica).  Pt. Reports "catching" in R posterior leg with rolling in bed.  Pt. Teaches Yoga 1 day/week.   ? ?PERTINENT HISTORY:  11/02/21 MD visit ?CC: Low back pain, left knee pain ?  ?HPI: ?Subjective  ?  ?09/14/2021 ?Patient does give a episode of potential radiculopathy.  Discussed with patient about icing regimen and home exercises, discussed which activities to do and which ones to avoid.  Once to start with home exercises which was given to him by athletic trainer.  Follow-up again in 6 weeks ?  ?Arthritic changes of the knees bilaterally.  Discussed the possibility of injections.  Discussed icing regimen and home exercises.  Patient feels like he is doing relatively well at the moment. Discussed HEP we will get bilateral standing x-rays.  Follow-up again in 4 to 6 weeks.  Worsening pain consider formal physical therapy or injections. ?  ?Updated 11/02/2021 ?Daniel Lawrence is a 61 y.o. male coming in with complaint of LBP and left knee pain. Patient states that he continues to have sciatic nerve pain in R leg that is occurring more frequently. Would like to go to PT to be held accountable. Does not want to focus on knee pain at this time as his R  hip and leg pain is more prevalent.  ?  ?  ?  ?  ?  ?  ?    ?Past Medical History:  ?Diagnosis Date  ? Allergy    ? Arthritis    ?  left knee  ? Cancer [facility])    ?  skin cancer  ? Cataract    ? Hyperlipidemia    ? Hypertension    ?  ?     ?Past Surgical History:  ?Procedure Laterality Date  ? BRAIN SURGERY      ? ELEVATION OF DEPRESSED SKULL FRACTURE   1971  ?  traumatic blow to head by golf club   , [facility])  ? MOHS SURGERY   March 2013  ?  left temple, squamous  ? ROBOT ASSISTED LAPAROSCOPIC NEPHRECTOMY Right 04/20/2016  ?  Procedure: XI ROBOTIC ASSISTED LAPAROSCOPIC RADICAL NEPHRECTOMY;  Surgeon: Daniel Frock, MD;  Location: [facility];  Service: Urology;  Laterality: Right;  ?  ?Social History  ?  ?     ?Socioeconomic History  ? Marital status: Married  ?    Spouse name: Not on file  ? Number of children: Not on file  ? Years of education: Not on file  ? Highest education level: Not on file  ?Occupational History  ? Not on file  ?Tobacco Use  ? Smoking status: Never  ? Smokeless tobacco: Never  ?Vaping Use  ? Vaping Use: Never used  ?Substance and Sexual Activity  ? Alcohol use: Yes  ?    Alcohol/week: 6.0 standard drinks  ?    Types: 6 Glasses of wine per week  ? Drug use: No  ? Sexual activity: Not on file  ?Other Topics Concern  ? Not on file  ?Social History Narrative  ? Not on file  ?  ?Social Determinants of Health  ?  ?Financial Resource Strain: Not on file  ?Food Insecurity: Not on file  ?Transportation Needs: Not on file  ?Physical Activity: Not on file  ?Stress: Not on file  ?  Social Connections: Not on file  ?  ?    ?Allergies  ?Allergen Reactions  ? Sulfa Antibiotics Rash  ?  ?     ?Family History  ?Problem Relation Age of Onset  ? Alzheimer's disease Mother    ? Heart disease Father    ?      after age 61  ? Alzheimer's disease Maternal Aunt    ? Alzheimer's disease Maternal Uncle    ? Cancer Maternal Grandmother    ?      breast  ? Cancer Maternal Grandfather    ?      lung,  tobacco abuse  ? Colon cancer  Neg Hx    ? Esophageal cancer Neg Hx    ? Stomach cancer Neg Hx    ?  ?  ?  ?Current Outpatient Medications (Cardiovascular):  ?  losartan (COZAAR) 50 MG tablet, TAKE 1 TABLET BY MOUTH EVERY DAY ?  rosuvastatin (CRESTOR) 10 MG tablet, TAKE 1 TABLET BY MOUTH EVERY DAY ?  rosuvastatin (CRESTOR) 5 MG tablet, TAKE 1 TABLET (5 MG TOTAL) BY MOUTH DAILY. ?  sildenafil (VIAGRA) 50 MG tablet, Take 1 tablet (50 mg total) by mouth daily as needed for erectile dysfunction. ?  ?Current Outpatient Medications (Respiratory):  ?  fluticasone (FLONASE) 50 MCG/ACT nasal spray, Place 2 sprays into both nostrils daily. (Patient taking differently: Place 1 spray into both nostrils daily.) ?  loratadine (CLARITIN) 10 MG tablet, Take 10 mg by mouth daily as needed for allergies.  ?  ?  ?  ?Current Outpatient Medications (Other):  ?  Black Pepper-Turmeric (TURMERIC CURCUMIN) 11-998 MG CAPS,  ?  Cholecalciferol (VITAMIN D-3) 1000 units CAPS,  ?  Coenzyme Q10 100 MG capsule, Take 100 mg by mouth daily. ?  gabapentin (NEURONTIN) 100 MG capsule, Take 2 capsules (200 mg total) by mouth at bedtime. ?  ?  ?Reviewed prior external information including notes and imaging from  ?primary care provider ?As well as notes that were available from care everywhere and other healthcare systems. ?  ?Past medical history, social, surgical and family history all reviewed in electronic medical record.  No pertanent information unless stated regarding to the chief complaint.  ?  ?Review of Systems: ? No headache, visual changes, nausea, vomiting, diarrhea, constipation, dizziness, abdominal pain, skin rash, fevers, chills, night sweats, weight loss, swollen lymph nodes, body aches, joint swelling, chest pain, shortness of breath, mood changes. POSITIVE muscle aches ?  ?Objective  ?Blood pressure 130/82, pulse 72, height '5\' 9"'$  (1.753 m), weight 197 lb (89.4 kg), SpO2 98 %. ?   ?General: No apparent distress alert and oriented x3 mood and affect normal, dressed  appropriately.  ?HEENT: Pupils equal, extraocular movements intact  ?Respiratory: Patient's speak in full sentences and does not appear short of breath  ?Cardiovascular: No lower extremity edema, non ten

## 2021-11-20 ENCOUNTER — Encounter: Payer: BC Managed Care – PPO | Admitting: Physical Therapy

## 2021-11-22 ENCOUNTER — Ambulatory Visit: Payer: BC Managed Care – PPO | Admitting: Physical Therapy

## 2021-11-22 ENCOUNTER — Encounter: Payer: Self-pay | Admitting: Physical Therapy

## 2021-11-22 DIAGNOSIS — M5441 Lumbago with sciatica, right side: Secondary | ICD-10-CM | POA: Diagnosis not present

## 2021-11-22 DIAGNOSIS — M6281 Muscle weakness (generalized): Secondary | ICD-10-CM

## 2021-11-22 DIAGNOSIS — M25562 Pain in left knee: Secondary | ICD-10-CM | POA: Diagnosis not present

## 2021-11-22 DIAGNOSIS — G8929 Other chronic pain: Secondary | ICD-10-CM

## 2021-11-22 DIAGNOSIS — M545 Low back pain, unspecified: Secondary | ICD-10-CM | POA: Diagnosis not present

## 2021-11-22 NOTE — Patient Instructions (Signed)
Access Code: [email or web address] ?URL: [email or web address] ?Date: 11/22/2021 ?Prepared by: Dorcas Carrow ? ?Exercises ?- Hooklying Transversus Abdominis Palpation  - 1 x daily - 7 x weekly - 1 sets - 10 reps ?- Supine March  - 1 x daily - 7 x weekly - 2 sets - 10 reps ?- Supine Bridge  - 1 x daily - 7 x weekly - 2 sets - 10 reps ?- Bridge with Hip Abduction and Resistance - Ground Touches  - 1 x daily - 7 x weekly - 2 sets - 10 reps ?- Supine Sciatic Nerve Glide  - 1 x daily - 7 x weekly - 3 sets - 4 reps ?- Thoracic Extension Mobilization on Foam Roll  - 1 x daily - 7 x weekly - 3 sets - 10 reps ?- Thoracic Extension Mobilization on Foam Roll  - 1 x daily - 7 x weekly - 1 sets - 3 reps ?- Seated Thoracic Lumbar Extension  - 1 x daily - 7 x weekly - 1 sets - 3 reps ?

## 2021-11-22 NOTE — Therapy (Signed)
?OUTPATIENT PHYSICAL THERAPY THORACOLUMBAR EVALUATION ? ? ?Patient Name: Daniel Lawrence ?MRN: [id number] ?DOB:08/21/1960, 61 y.o., male ?Today's Date: 11/22/2021 ? ? PT End of Session - 11/22/21 1245   ? ? Visit Number 2   ? Number of Visits 9   ? Date for PT Re-Evaluation 01/10/22   ? PT Start Time [phone]   ? PT Stop Time 2703   ? PT Time Calculation (min) 49 min   ? Activity Tolerance Patient tolerated treatment well   ? Behavior During Therapy [facility] for tasks assessed/performed   ? ?  ?  ? ?  ? ? ?Past Medical History:  ?Diagnosis Date  ? Allergy   ? Arthritis   ? left knee  ? Cancer [facility])   ? skin cancer  ? Cataract   ? Hyperlipidemia   ? Hypertension   ? ?Past Surgical History:  ?Procedure Laterality Date  ? BRAIN SURGERY    ? ELEVATION OF DEPRESSED SKULL FRACTURE  1971  ? traumatic blow to head by golf club   , [facility])  ? MOHS SURGERY  March 2013  ? left temple, squamous  ? ROBOT ASSISTED LAPAROSCOPIC NEPHRECTOMY Right 04/20/2016  ? Procedure: XI ROBOTIC ASSISTED LAPAROSCOPIC RADICAL NEPHRECTOMY;  Surgeon: Alexis Frock, MD;  Location: [facility];  Service: Urology;  Laterality: Right;  ? ?Patient Active Problem List  ? Diagnosis Date Noted  ? Low back pain 09/15/2021  ? Cancer ([place]) 06/29/2021  ? Mass of soft tissue of shoulder 06/29/2021  ? Mass of soft tissue of neck 06/29/2021  ? Aortic atherosclerosis ([place]) 04/18/2021  ? Melanoma in situ of right upper arm ([place]) 04/18/2021  ? H/O right nephrectomy 04/17/2020  ? Loss of libido 04/14/2019  ? Degenerative arthritis of left knee 02/25/2018  ? Chronic pain of left knee 02/08/2018  ? CKD (chronic kidney disease) stage 3, GFR 30-59 ml/min (HCC) 02/08/2018  ? Coronary atherosclerosis due to lipid rich plaque 05/26/2016  ? History of renal cell carcinoma 04/20/2016  ? B12 deficiency 04/08/2016  ? Encounter for preventive health examination 06/24/2014  ? Hypertension 06/23/2014  ? 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 Chronic  right shoulder pain 06/28/2013  ? Inguinal hernia 12/10/2011  ? Hyperlipidemia with target LDL less than 100 12/10/2011  ? ? ?PCP: Deborra Medina, MD ? ?REFERRING PROVIDER: Hulan Saas, DO ? ?REFERRING DIAG: Lumbar spine pain/ L knee pain.   ? ?THERAPY DIAG:  ?Chronic bilateral low back pain with right-sided sciatica ? ?Muscle weakness (generalized) ? ?ONSET DATE: 07/16/21 ? ?SUBJECTIVE:                                                                                                                                                                                          ? ?  SUBJECTIVE STATEMENT: ?Pt. Reports no new complaints.  Pt. Demonstrates rolling over on mat table with hip ER resulting in an increase in R radicular symptoms/ hamstring pain.    ? ?PERTINENT HISTORY:  11/02/21 MD visit ?CC: Low back pain, left knee pain ?  ?HPI: ?Subjective  ?  ?09/14/2021 ?Patient does give a episode of potential radiculopathy.  Discussed with patient about icing regimen and home exercises, discussed which activities to do and which ones to avoid.  Once to start with home exercises which was given to him by athletic trainer.  Follow-up again in 6 weeks ?  ?Arthritic changes of the knees bilaterally.  Discussed the possibility of injections.  Discussed icing regimen and home exercises.  Patient feels like he is doing relatively well at the moment. Discussed HEP we will get bilateral standing x-rays.  Follow-up again in 4 to 6 weeks.  Worsening pain consider formal physical therapy or injections. ?  ?Updated 11/02/2021 ?Daniel Lawrence is a 61 y.o. male coming in with complaint of LBP and left knee pain. Patient states that he continues to have sciatic nerve pain in R leg that is occurring more frequently. Would like to go to PT to be held accountable. Does not want to focus on knee pain at this time as his R hip and leg pain is more prevalent.  ?  ?  ?  ?  ?  ?  ?    ?Past Medical History:  ?Diagnosis Date  ? Allergy    ? Arthritis     ?  left knee  ? Cancer [facility])    ?  skin cancer  ? Cataract    ? Hyperlipidemia    ? Hypertension    ?  ?     ?Past Surgical History:  ?Procedure Laterality Date  ? BRAIN SURGERY      ? ELEVATION OF DEPRESSED SKULL FRACTURE   1971  ?  traumatic blow to head by golf club   , [facility]  ? MOHS SURGERY   March 2013  ?  left temple, squamous  ? ROBOT ASSISTED LAPAROSCOPIC NEPHRECTOMY Right 04/20/2016  ?  Procedure: XI ROBOTIC ASSISTED LAPAROSCOPIC RADICAL NEPHRECTOMY;  Surgeon: Alexis Frock, MD;  Location: [facility];  Service: Urology;  Laterality: Right;  ?  ?Social History  ?  ?     ?Socioeconomic History  ? Marital status: Married  ?    Spouse name: Not on file  ? Number of children: Not on file  ? Years of education: Not on file  ? Highest education level: Not on file  ?Occupational History  ? Not on file  ?Tobacco Use  ? Smoking status: Never  ? Smokeless tobacco: Never  ?Vaping Use  ? Vaping Use: Never used  ?Substance and Sexual Activity  ? Alcohol use: Yes  ?    Alcohol/week: 6.0 standard drinks  ?    Types: 6 Glasses of wine per week  ? Drug use: No  ? Sexual activity: Not on file  ?Other Topics Concern  ? Not on file  ?Social History Narrative  ? Not on file  ?  ?Social Determinants of Health  ?  ?Financial Resource Strain: Not on file  ?Food Insecurity: Not on file  ?Transportation Needs: Not on file  ?Physical Activity: Not on file  ?Stress: Not on file  ?Social Connections: Not on file  ?  ?    ?Allergies  ?Allergen Reactions  ? Sulfa Antibiotics Rash  ?  ?     ?  Family History  ?Problem Relation Age of Onset  ? Alzheimer's disease Mother    ? Heart disease Father    ?      after age 72  ? Alzheimer's disease Maternal Aunt    ? Alzheimer's disease Maternal Uncle    ? Cancer Maternal Grandmother    ?      breast  ? Cancer Maternal Grandfather    ?      lung,  tobacco abuse  ? Colon cancer Neg Hx    ? Esophageal cancer Neg Hx    ? Stomach cancer Neg Hx    ?  ?  ?  ?Current Outpatient Medications (Cardiovascular):   ?  losartan (COZAAR) 50 MG tablet, TAKE 1 TABLET BY MOUTH EVERY DAY ?  rosuvastatin (CRESTOR) 10 MG tablet, TAKE 1 TABLET BY MOUTH EVERY DAY ?  rosuvastatin (CRESTOR) 5 MG tablet, TAKE 1 TABLET (5 MG TOTAL) BY MOUTH DAILY. ?  sildenafil (VIAGRA) 50 MG tablet, Take 1 tablet (50 mg total) by mouth daily as needed for erectile dysfunction. ?  ?Current Outpatient Medications (Respiratory):  ?  fluticasone (FLONASE) 50 MCG/ACT nasal spray, Place 2 sprays into both nostrils daily. (Patient taking differently: Place 1 spray into both nostrils daily.) ?  loratadine (CLARITIN) 10 MG tablet, Take 10 mg by mouth daily as needed for allergies.  ?  ?  ?  ?Current Outpatient Medications (Other):  ?  Black Pepper-Turmeric (TURMERIC CURCUMIN) 11-998 MG CAPS,  ?  Cholecalciferol (VITAMIN D-3) 1000 units CAPS,  ?  Coenzyme Q10 100 MG capsule, Take 100 mg by mouth daily. ?  gabapentin (NEURONTIN) 100 MG capsule, Take 2 capsules (200 mg total) by mouth at bedtime. ?  ?  ?Reviewed prior external information including notes and imaging from  ?primary care provider ?As well as notes that were available from care everywhere and other healthcare systems. ?  ?Past medical history, social, surgical and family history all reviewed in electronic medical record.  No pertanent information unless stated regarding to the chief complaint.  ?  ?Review of Systems: ? No headache, visual changes, nausea, vomiting, diarrhea, constipation, dizziness, abdominal pain, skin rash, fevers, chills, night sweats, weight loss, swollen lymph nodes, body aches, joint swelling, chest pain, shortness of breath, mood changes. POSITIVE muscle aches ?  ?Objective  ?Blood pressure 130/82, pulse 72, height '5\' 9"'$  (1.753 m), weight 197 lb (89.4 kg), SpO2 98 %. ?   ?General: No apparent distress alert and oriented x3 mood and affect normal, dressed appropriately.  ?HEENT: Pupils equal, extraocular movements intact  ?Respiratory: Patient's speak in full sentences and does  not appear short of breath  ?Cardiovascular: No lower extremity edema, non tender, no erythema  ?Gait severely antalgic gait ?Patient has less than 5 degrees of extension of the back.  Patient does have a m

## 2021-11-27 ENCOUNTER — Ambulatory Visit: Payer: BC Managed Care – PPO | Admitting: Physical Therapy

## 2021-11-27 DIAGNOSIS — M6281 Muscle weakness (generalized): Secondary | ICD-10-CM | POA: Diagnosis not present

## 2021-11-27 DIAGNOSIS — M545 Low back pain, unspecified: Secondary | ICD-10-CM | POA: Diagnosis not present

## 2021-11-27 DIAGNOSIS — M25562 Pain in left knee: Secondary | ICD-10-CM | POA: Diagnosis not present

## 2021-11-27 DIAGNOSIS — M5441 Lumbago with sciatica, right side: Secondary | ICD-10-CM | POA: Diagnosis not present

## 2021-11-27 DIAGNOSIS — G8929 Other chronic pain: Secondary | ICD-10-CM | POA: Diagnosis not present

## 2021-11-27 NOTE — Therapy (Signed)
?OUTPATIENT PHYSICAL THERAPY THORACOLUMBAR TREATMENT ? ? ?Patient Name: Daniel Lawrence ?MRN: [id number] ?DOB:05-04-1961, 61 y.o., male ?Today's Date: 11/27/2021 ? ? PT End of Session - 11/27/21 1429   ? ? Visit Number 3   ? Number of Visits 9   ? Date for PT Re-Evaluation 01/10/22   ? PT Start Time 1429   ? PT Stop Time 6387   ? PT Time Calculation (min) 50 min   ? Activity Tolerance Patient tolerated treatment well   ? Behavior During Therapy [facility] for tasks assessed/performed   ? ?  ?  ? ?  ? ? ?Past Medical History:  ?Diagnosis Date  ? Allergy   ? Arthritis   ? left knee  ? Cancer [facility])   ? skin cancer  ? Cataract   ? Hyperlipidemia   ? Hypertension   ? ?Past Surgical History:  ?Procedure Laterality Date  ? BRAIN SURGERY    ? ELEVATION OF DEPRESSED SKULL FRACTURE  1971  ? traumatic blow to head by golf club   , [facility])  ? MOHS SURGERY  March 2013  ? left temple, squamous  ? ROBOT ASSISTED LAPAROSCOPIC NEPHRECTOMY Right 04/20/2016  ? Procedure: XI ROBOTIC ASSISTED LAPAROSCOPIC RADICAL NEPHRECTOMY;  Surgeon: Alexis Frock, MD;  Location: [facility];  Service: Urology;  Laterality: Right;  ? ?Patient Active Problem List  ? Diagnosis Date Noted  ? Low back pain 09/15/2021  ? Cancer ([place]) 06/29/2021  ? Mass of soft tissue of shoulder 06/29/2021  ? Mass of soft tissue of neck 06/29/2021  ? Aortic atherosclerosis ([place]) 04/18/2021  ? Melanoma in situ of right upper arm ([place]) 04/18/2021  ? H/O right nephrectomy 04/17/2020  ? Loss of libido 04/14/2019  ? Degenerative arthritis of left knee 02/25/2018  ? Chronic pain of left knee 02/08/2018  ? CKD (chronic kidney disease) stage 3, GFR 30-59 ml/min (HCC) 02/08/2018  ? Coronary atherosclerosis due to lipid rich plaque 05/26/2016  ? History of renal cell carcinoma 04/20/2016  ? B12 deficiency 04/08/2016  ? Encounter for preventive health examination 06/24/2014  ? Hypertension 06/23/2014  ? 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 Chronic  right shoulder pain 06/28/2013  ? Inguinal hernia 12/10/2011  ? Hyperlipidemia with target LDL less than 100 12/10/2011  ? ? ?PCP: Deborra Medina, MD ? ?REFERRING PROVIDER: Hulan Saas, DO ? ?REFERRING DIAG: Lumbar spine pain/ L knee pain.   ? ?THERAPY DIAG:  ?Chronic bilateral low back pain with right-sided sciatica ? ?Muscle weakness (generalized) ? ?ONSET DATE: 07/16/21 ? ?SUBJECTIVE:                                                                                                                                                                                          ? ?  SUBJECTIVE STATEMENT: ?Pt. Continues to report pain with rolling over in bed and with initial aspects of standing/ walking.  Pt. Went to play 18 holes of golf last Friday and did okay in low back.  Pt. States it took him 10 holes to "loosen up".   ? ?PERTINENT HISTORY:  11/02/21 MD visit ?CC: Low back pain, left knee pain ?  ?HPI: ?Subjective  ?  ?09/14/2021 ?Patient does give a episode of potential radiculopathy.  Discussed with patient about icing regimen and home exercises, discussed which activities to do and which ones to avoid.  Once to start with home exercises which was given to him by athletic trainer.  Follow-up again in 6 weeks ?  ?Arthritic changes of the knees bilaterally.  Discussed the possibility of injections.  Discussed icing regimen and home exercises.  Patient feels like he is doing relatively well at the moment. Discussed HEP we will get bilateral standing x-rays.  Follow-up again in 4 to 6 weeks.  Worsening pain consider formal physical therapy or injections. ?  ?Updated 11/02/2021 ?Daniel Lawrence is a 61 y.o. male coming in with complaint of LBP and left knee pain. Patient states that he continues to have sciatic nerve pain in R leg that is occurring more frequently. Would like to go to PT to be held accountable. Does not want to focus on knee pain at this time as his R hip and leg pain is more prevalent.  ?  ?  ? ? ?TODAY'S  TREATMENT  ?11/27/21: ? ?There.ex.: ? ?Scifit L6 5 min. F/b (warm-up/discussed back symptoms/ playing golf) ? ?Walking in PT clinic with gait reassessment.   ? ?Nautilus: walk outs/ press 30# 5x3 with cuing for TrA muscle activation/ upright posture.  ? ?Supine TrA ex.: hip adduction isometric with ball and added SAQ with progression to bridge.  Good technique with no increase c/o pain or radicular symptoms.  ? ?Manual tx.: ? ?Supine R LE neural glides (flossing technique).  Active knee extension (as tolerated)  ? ?Supine knee to chest/ trunk rotn. Stretches (no increase in symptoms). ? ?Prone grade II-III PA mobs. To mid-thoracic spine (moderate hypomobility)- 2x20 sec.  STM to thoracic/lumbar paraspinals/ use of Hypervolt (good tx. Tolerance).  Reassessment of thoracic mobility.   ? ?Prone press-ups/ hip extension (no pain).  ? ? ?11/22/21: ? ?There.ex.: ? ?See HEP (Access Code: [email or web address])- good TrA muscle activation.  Issued BTB. ? ?Manual tx.: ? ?Supine R LE neural glides (flossing technique).  Pt. Has excellent hamstring/ hip flexibility (all planes).   ? ?Supine knee to chest/ trunk rotn. Stretches (no increase in symptoms). ? ?Prone grade II-III PA mobs. To mid-thoracic spine (moderate hypomobility)- 2x20 sec.  Discussed thoracic extension with bolster in supine/ seated in chair. ? ? ? ?PATIENT EDUCATION:  ?Education details: Nerve glides/ Ex. Program (Access Code: [email or web address]) ?Person educated: Patient ?Education method: Explanation ?Education comprehension: verbalized understanding ? ? ?HOME EXERCISE PROGRAM: ?Access Code: [email or web address] ?URL: [email or web address] ?Date: 11/22/2021 ?Prepared by: Dorcas Carrow ?  ?Exercises ?- Hooklying Transversus Abdominis Palpation  - 1 x daily - 7 x weekly - 1 sets - 10 reps ?- Supine March  - 1 x daily - 7 x weekly - 2 sets - 10 reps ?- Supine Bridge  - 1 x daily - 7 x weekly - 2 sets - 10 reps ?- Bridge with Hip Abduction and Resistance - Ground Touches  - 1 x daily  - 7 x weekly - 2 sets - 10 reps ?-  Supine Sciatic Nerve Glide  - 1 x daily - 7 x weekly - 3 sets - 4 reps ?- Thoracic Extension Mobilization on Foam Roll  - 1 x daily - 7 x weekly - 3 sets - 10 reps ?- Thoracic Extension Mobilization on Foam Roll  - 1 x daily - 7 x weekly - 1 sets - 3 reps ?- Seated Thoracic Lumbar Extension  - 1 x daily - 7 x weekly - 1 sets - 3 reps ? ?ASSESSMENT: ? ?CLINICAL IMPRESSION: 11/27/21 ?Pt. Demonstrates improved log roll/ bed mobility on mat table with no increase c/o pain today.  Good prone hip extension/ press-ups with no increase c/o low back pain or radicular symptoms.  Pt. Feels a stretch in abdominal musculature during full prone press-up.  Pt. Will continue with core based ex. And instructed to continue with HEP while on work trip to [employer].  No changes to HEP.   ? ? ?OBJECTIVE IMPAIRMENTS decreased endurance, decreased mobility, decreased strength, hypomobility, and pain.  ? ?ACTIVITY LIMITATIONS cleaning and occupation.  ? ?PERSONAL FACTORS Age, Fitness, and 1 comorbidity:    are also affecting patient's functional outcome.  ? ? ?REHAB POTENTIAL: Good ? ?CLINICAL DECISION MAKING: Stable/uncomplicated ? ?EVALUATION COMPLEXITY: Low ? ? ?GOALS: ?Goals reviewed with patient? Yes ? ?SHORT TERM GOALS: Target date: 12/13/21 ? ?Pt. Will be independent with core stability/ TrA ex. Program to improve pain-free mobility at home.  ?Baseline: Pt. Participates with yoga. ?Goal status: INITIAL ? ? ?LONG TERM GOALS: Target date: 01/10/22 ? ?Pt. Will increase FOTO to 73 to improve pain-free mobility.  ?Baseline: initail 61 ?Goal status: INITIAL ? ?2.  Pt. Will reports no R posterior hamstring/LE symptoms consistently for 1 week to improve pain-free mobility.   ?Baseline: R posterior hamstring discomfort/ catching ?Goal status: INITIAL ? ?3.  Pt. Will reports no low back/LE symptoms with sleeping at night. ?Baseline: increase discomfort with rolling in bed ?Goal status: INITIAL ? ? ?PLAN: ?PT  FREQUENCY: 1x/week ? ?PT DURATION: 8 weeks ? ?PLANNED INTERVENTIONS: Therapeutic exercises, Therapeutic activity, Neuromuscular re-education, Balance training, Patient/Family education, Joint mobilization, D

## 2021-11-29 ENCOUNTER — Encounter: Payer: BC Managed Care – PPO | Admitting: Physical Therapy

## 2021-12-04 ENCOUNTER — Ambulatory Visit: Payer: BC Managed Care – PPO | Admitting: Physical Therapy

## 2021-12-04 DIAGNOSIS — M25562 Pain in left knee: Secondary | ICD-10-CM | POA: Diagnosis not present

## 2021-12-04 DIAGNOSIS — M6281 Muscle weakness (generalized): Secondary | ICD-10-CM | POA: Diagnosis not present

## 2021-12-04 DIAGNOSIS — M545 Low back pain, unspecified: Secondary | ICD-10-CM | POA: Diagnosis not present

## 2021-12-04 DIAGNOSIS — G8929 Other chronic pain: Secondary | ICD-10-CM | POA: Diagnosis not present

## 2021-12-04 DIAGNOSIS — M5441 Lumbago with sciatica, right side: Secondary | ICD-10-CM | POA: Diagnosis not present

## 2021-12-06 ENCOUNTER — Ambulatory Visit: Payer: BC Managed Care – PPO | Admitting: Physical Therapy

## 2021-12-06 DIAGNOSIS — M545 Low back pain, unspecified: Secondary | ICD-10-CM | POA: Diagnosis not present

## 2021-12-06 DIAGNOSIS — G8929 Other chronic pain: Secondary | ICD-10-CM | POA: Diagnosis not present

## 2021-12-06 DIAGNOSIS — M25562 Pain in left knee: Secondary | ICD-10-CM | POA: Diagnosis not present

## 2021-12-06 DIAGNOSIS — M5441 Lumbago with sciatica, right side: Secondary | ICD-10-CM | POA: Diagnosis not present

## 2021-12-06 DIAGNOSIS — M6281 Muscle weakness (generalized): Secondary | ICD-10-CM | POA: Diagnosis not present

## 2021-12-08 DIAGNOSIS — F419 Anxiety disorder, unspecified: Secondary | ICD-10-CM | POA: Diagnosis not present

## 2021-12-08 NOTE — Therapy (Addendum)
OUTPATIENT PHYSICAL THERAPY THORACOLUMBAR TREATMENT   Patient Name: Daniel Lawrence MRN: [id number] DOB:05/30/1961, 61 y.o., male Today's Date: 12/04/2021   PT End of Session - 12/08/21 0746     Visit Number 4    Number of Visits 9    Date for PT Re-Evaluation 01/10/22    PT Start Time 9357    PT Stop Time 0177    PT Time Calculation (min) 50 min    Activity Tolerance Patient tolerated treatment well    Behavior During Therapy WFL for tasks assessed/performed             Past Medical History:  Diagnosis Date   Allergy    Arthritis    left knee   Cancer ([place])    skin cancer   Cataract    Hyperlipidemia    Hypertension    Past Surgical History:  Procedure Laterality Date   BRAIN SURGERY     ELEVATION OF DEPRESSED SKULL FRACTURE  1971   traumatic blow to head by golf club   , [facility])   MOHS SURGERY  March 2013   left temple, squamous   ROBOT ASSISTED LAPAROSCOPIC NEPHRECTOMY Right 04/20/2016   Procedure: XI ROBOTIC ASSISTED LAPAROSCOPIC RADICAL NEPHRECTOMY;  Surgeon: Alexis Frock, MD;  Location: [facility];  Service: Urology;  Laterality: Right;   Patient Active Problem List   Diagnosis Date Noted   Low back pain 09/15/2021   Cancer ([place]) 06/29/2021   Mass of soft tissue of shoulder 06/29/2021   Mass of soft tissue of neck 06/29/2021   Aortic atherosclerosis ([place]) 04/18/2021   Melanoma in situ of right upper arm ([place]) 04/18/2021   H/O right nephrectomy 04/17/2020   Loss of libido 04/14/2019   Degenerative arthritis of left knee 02/25/2018   Chronic pain of left knee 02/08/2018   CKD (chronic kidney disease) stage 3, GFR 30-59 ml/min (HCC) 02/08/2018   Coronary atherosclerosis due to lipid rich plaque 05/26/2016   History of renal cell carcinoma 04/20/2016   B12 deficiency 04/08/2016   Encounter for preventive health examination 06/24/2014   Hypertension 06/23/2014   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    THERAPY DIAG:  Chronic bilateral low back pain with right-sided sciatica  Muscle weakness (generalized)  ONSET DATE: 07/16/21  SUBJECTIVE:  SUBJECTIVE STATEMENT: Pt. States he is doing better and less guarded with gait/ standing.  Pt. C/o minimal symptoms during standing up from chair/ initiating gait.     PERTINENT HISTORY:  11/02/21 MD visit CC: Low back pain, left knee pain   HPI: Subjective    09/14/2021 Patient does give a episode of potential radiculopathy.  Discussed with patient about icing regimen and home exercises, discussed which activities to do and which ones to avoid.  Once to start with home exercises which was given to him by athletic trainer.  Follow-up again in 6 weeks   Arthritic changes of the knees bilaterally.  Discussed the possibility of injections.  Discussed icing regimen and home exercises.  Patient feels like he is doing relatively well at the moment. Discussed HEP we will get bilateral standing x-rays.  Follow-up again in 4 to 6 weeks.  Worsening pain consider formal physical therapy or injections.   Updated 11/02/2021 DAMONEY JULIA is a 61 y.o. male coming in with complaint of LBP and left knee pain. Patient states that he continues to have sciatic nerve pain in R leg that is occurring more frequently. Would like to go to PT to be held accountable. Does not want to focus on knee pain at this time as his R hip and leg pain is more prevalent.        TODAY'S TREATMENT   12/04/21:   There.ex:  Scifit L5 10 min. B UE/LE.  Quad/LE muscle  fatigue reported.    Supine TrA ex.: bridging with ball/ knee to chest/ trunk rotn.  Supine 1/2 bolster thoracic extension on mat table (varying levels)  Manual tx.:  Supine R LE neural glides (flossing technique).  Active knee extension (as tolerated)   Supine knee to chest/ trunk rotn. Stretches with overpressure.  Prone grade II-III PA mobs. To mid-thoracic spine (moderate hypomobility)- 2x20 sec.  STM to thoracic/lumbar paraspinals/ use of Hypervolt (good tx. Tolerance).  Seated thoracic rotn. (WNL).     Prone press-ups/ hip extension (no pain).     11/27/21:  There.ex.:  Scifit L6 5 min. F/b (warm-up/discussed back symptoms/ playing golf)  Walking in PT clinic with gait reassessment.    Nautilus: walk outs/ press 30# 5x3 with cuing for TrA muscle activation/ upright posture.   Supine TrA ex.: hip adduction isometric with ball and added SAQ with progression to bridge.  Good technique with no increase c/o pain or radicular symptoms.   Manual tx.:  Supine R LE neural glides (flossing technique).  Active knee extension (as tolerated)   Supine knee to chest/ trunk rotn. Stretches (no increase in symptoms).  Prone grade II-III PA mobs. To mid-thoracic spine (moderate hypomobility)- 2x20 sec.  STM to thoracic/lumbar paraspinals/ use of Hypervolt (good tx. Tolerance).  Reassessment of thoracic mobility.    Prone press-ups/ hip extension (no pain).     PATIENT EDUCATION:  Education details: Nerve glides/ Ex. Program (Access Code: [email or web address]) Person educated: Patient Education method: Explanation Education comprehension: verbalized understanding   HOME EXERCISE PROGRAM: Access Code: [email or web address] URL: [email or web address] Date: 11/22/2021 Prepared by: Dorcas Carrow   Exercises - Hooklying Transversus Abdominis Palpation  - 1 x daily - 7 x weekly - 1 sets - 10 reps - Supine March  - 1 x daily - 7 x weekly - 2 sets - 10 reps - Supine Bridge  - 1 x daily - 7 x  weekly - 2 sets - 10 reps - Bridge with Hip Abduction and Resistance - Ground  Touches  - 1 x daily - 7 x weekly - 2 sets - 10 reps - Supine Sciatic Nerve Glide  - 1 x daily - 7 x weekly - 3 sets - 4 reps - Thoracic Extension Mobilization on Foam Roll  - 1 x daily - 7 x weekly - 3 sets - 10 reps - Thoracic Extension Mobilization on Foam Roll  - 1 x daily - 7 x weekly - 1 sets - 3 reps - Seated Thoracic Lumbar Extension  - 1 x daily - 7 x weekly - 1 sets - 3 reps  ASSESSMENT:  CLINICAL IMPRESSION: 12/04/21 Pt. Progressing well with sit to stands/ gait with less overall back symptoms.  Thoracic hypomobility present during mobs./ extension stretches on bolster.  Pt. Demonstrates good TrA muscle activation during supine ex.  No change to HEP at this time.  Pt. Will continue with core based ex. And instructed to continue with HEP to improve pain-free functional mobility.     OBJECTIVE IMPAIRMENTS decreased endurance, decreased mobility, decreased strength, hypomobility, and pain.   ACTIVITY LIMITATIONS cleaning and occupation.   PERSONAL FACTORS Age, Fitness, and 1 comorbidity:    are also affecting patient's functional outcome.    REHAB POTENTIAL: Good  CLINICAL DECISION MAKING: Stable/uncomplicated  EVALUATION COMPLEXITY: Low   GOALS: Goals reviewed with patient? Yes  SHORT TERM GOALS: Target date: 12/13/21  Pt. Will be independent with core stability/ TrA ex. Program to improve pain-free mobility at home.  Baseline: Pt. Participates with yoga. Goal status: INITIAL   LONG TERM GOALS: Target date: 01/10/22  Pt. Will increase FOTO to 73 to improve pain-free mobility.  Baseline: initail 61 Goal status: INITIAL  2.  Pt. Will reports no R posterior hamstring/LE symptoms consistently for 1 week to improve pain-free mobility.   Baseline: R posterior hamstring discomfort/ catching Goal status: INITIAL  3.  Pt. Will reports no low back/LE symptoms with sleeping at night. Baseline:  increase discomfort with rolling in bed Goal status: INITIAL   PLAN: PT FREQUENCY: 1x/week  PT DURATION: 8 weeks  PLANNED INTERVENTIONS: Therapeutic exercises, Therapeutic activity, Neuromuscular re-education, Balance training, Patient/Family education, Joint mobilization, Dry Needling, Electrical stimulation, Cryotherapy, Moist heat, and Manual therapy.  PLAN FOR NEXT SESSION: Progress HEP  Pura Spice, PT, DPT # [phone] 12/08/2021, 7:48 AM

## 2021-12-10 NOTE — Therapy (Signed)
OUTPATIENT PHYSICAL THERAPY THORACOLUMBAR TREATMENT   Patient Name: Daniel Lawrence MRN: [id number] DOB:July 05, 1961, 61 y.o., male Today's Date: 12/06/2021   PT End of Session - 12/10/21 1945     Visit Number 5    Number of Visits 9    Date for PT Re-Evaluation 01/10/22    PT Start Time 6712    PT Stop Time 0945    PT Time Calculation (min) 48 min    Activity Tolerance Patient tolerated treatment well    Behavior During Therapy [facility] for tasks assessed/performed             Past Medical History:  Diagnosis Date   Allergy    Arthritis    left knee   Cancer ([place])    skin cancer   Cataract    Hyperlipidemia    Hypertension    Past Surgical History:  Procedure Laterality Date   BRAIN SURGERY     ELEVATION OF DEPRESSED SKULL FRACTURE  1971   traumatic blow to head by golf club   , [facility])   MOHS SURGERY  March 2013   left temple, squamous   ROBOT ASSISTED LAPAROSCOPIC NEPHRECTOMY Right 04/20/2016   Procedure: XI ROBOTIC ASSISTED LAPAROSCOPIC RADICAL NEPHRECTOMY;  Surgeon: Alexis Frock, MD;  Location: [facility];  Service: Urology;  Laterality: Right;   Patient Active Problem List   Diagnosis Date Noted   Low back pain 09/15/2021   Cancer ([place]) 06/29/2021   Mass of soft tissue of shoulder 06/29/2021   Mass of soft tissue of neck 06/29/2021   Aortic atherosclerosis ([place]) 04/18/2021   Melanoma in situ of right upper arm ([place]) 04/18/2021   H/O right nephrectomy 04/17/2020   Loss of libido 04/14/2019   Degenerative arthritis of left knee 02/25/2018   Chronic pain of left knee 02/08/2018   CKD (chronic kidney disease) stage 3, GFR 30-59 ml/min (HCC) 02/08/2018   Coronary atherosclerosis due to lipid rich plaque 05/26/2016   History of renal cell carcinoma 04/20/2016   B12 deficiency 04/08/2016   Encounter for preventive health examination 06/24/2014   Hypertension 06/23/2014   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    THERAPY DIAG:  Chronic bilateral low back pain with right-sided sciatica  Muscle weakness (generalized)  ONSET DATE: 07/16/21  SUBJECTIVE:  SUBJECTIVE STATEMENT: Pt. Continues to report improvements in movement/ standing.  Pt. Does c/o L SI tenderness with palpation while in seated/ supine position.    PERTINENT HISTORY:  11/02/21 MD visit CC: Low back pain, left knee pain   HPI: Subjective    09/14/2021 Patient does give a episode of potential radiculopathy.  Discussed with patient about icing regimen and home exercises, discussed which activities to do and which ones to avoid.  Once to start with home exercises which was given to him by athletic trainer.  Follow-up again in 6 weeks   Arthritic changes of the knees bilaterally.  Discussed the possibility of injections.  Discussed icing regimen and home exercises.  Patient feels like he is doing relatively well at the moment. Discussed HEP we will get bilateral standing x-rays.  Follow-up again in 4 to 6 weeks.  Worsening pain consider formal physical therapy or injections.   Updated 11/02/2021 Daniel Lawrence is a 61 y.o. male coming in with complaint of LBP and left knee pain. Patient states that he continues to have sciatic nerve pain in R leg that is occurring more frequently. Would like to go to PT to be held accountable. Does not want to focus on knee pain at this time as his R hip and leg pain is more prevalent.        TODAY'S TREATMENT   12/06/21:  There.ex.:  Scifit L6 10 min. B UE/LE (consistent cadence)-  warm up.    Seated ball ex.: wt. Shifting/ pelvic tilts/ marching/ LAQ/ alt. UE and LE (mirror feedback).  Good TrA muscle control/ balance.    Nautilus: 50# Pallof press 5x each direction x3.  50# scap. Retraction.  Supine ball: knee to chest/ bridging/ dead bug.  Supine open book thoracic/lumbar stretches L/R  Manual tx.:  Supine R LE neural glides (flossing technique).  Active knee extension (as tolerated).  Supine Thomas stretches with over pressure (increase L SI tenderness).     Prone grade II-III PA mobs. To mid-thoracic spine (moderate hypomobility)- 2x20 sec.  STM to thoracic/lumbar paraspinals/ use of Hypervolt (good tx. Tolerance).    Prone press-ups/ hip extension (no pain).    12/04/21:   There.ex:  Scifit L5 10 min. B UE/LE.  Quad/LE muscle fatigue reported.    Supine TrA ex.: bridging with ball/ knee to chest/ trunk rotn.  Supine 1/2 bolster thoracic extension on mat table (varying levels)  Manual tx.:  Supine R LE neural glides (flossing technique).  Active knee extension (as tolerated)   Supine knee to chest/ trunk rotn. Stretches with overpressure.  Prone grade II-III PA mobs. To mid-thoracic spine (moderate hypomobility)- 2x20 sec.  STM to thoracic/lumbar paraspinals/ use of Hypervolt (good tx. Tolerance).  Seated thoracic rotn. (WNL).     Prone press-ups/ hip extension (no pain).     11/27/21:  There.ex.:  Scifit L6 5 min. F/b (warm-up/discussed back symptoms/ playing golf)  Walking in PT clinic with gait reassessment.    Nautilus: walk outs/ press 30# 5x3 with cuing for TrA muscle activation/ upright posture.   Supine TrA ex.: hip adduction isometric with ball and added SAQ with progression to bridge.  Good technique with no increase c/o pain or radicular symptoms.   Manual tx.:  Supine R LE neural glides (flossing technique).  Active knee extension (as tolerated)   Supine knee to chest/ trunk rotn. Stretches (no increase in  symptoms).  Prone grade II-III PA mobs. To mid-thoracic spine (moderate hypomobility)- 2x20 sec.  STM  to thoracic/lumbar paraspinals/ use of Hypervolt (good tx. Tolerance).  Reassessment of thoracic mobility.    Prone press-ups/ hip extension (no pain).     PATIENT EDUCATION:  Education details: Nerve glides/ Ex. Program (Access Code: [email or web address]) Person educated: Patient Education method: Explanation Education comprehension: verbalized understanding   HOME EXERCISE PROGRAM: Access Code: [email or web address] URL: [email or web address] Date: 11/22/2021 Prepared by: Dorcas Carrow   Exercises - Hooklying Transversus Abdominis Palpation  - 1 x daily - 7 x weekly - 1 sets - 10 reps - Supine March  - 1 x daily - 7 x weekly - 2 sets - 10 reps - Supine Bridge  - 1 x daily - 7 x weekly - 2 sets - 10 reps - Bridge with Hip Abduction and Resistance - Ground Touches  - 1 x daily - 7 x weekly - 2 sets - 10 reps - Supine Sciatic Nerve Glide  - 1 x daily - 7 x weekly - 3 sets - 4 reps - Thoracic Extension Mobilization on Foam Roll  - 1 x daily - 7 x weekly - 3 sets - 10 reps - Thoracic Extension Mobilization on Foam Roll  - 1 x daily - 7 x weekly - 1 sets - 3 reps - Seated Thoracic Lumbar Extension  - 1 x daily - 7 x weekly - 1 sets - 3 reps  ASSESSMENT:  CLINICAL IMPRESSION: 12/04/21 Moderate thoracic hypomobility present during mobs./ extension stretches on bolster.  Pt. Demonstrates good TrA muscle activation during supine ex.  Progressing well in a pain-free range during position changes on mat table/ standing.  No change to HEP at this time.  Pt. Will continue with core based ex. And instructed to continue with HEP to improve pain-free functional mobility.     OBJECTIVE IMPAIRMENTS decreased endurance, decreased mobility, decreased strength, hypomobility, and pain.   ACTIVITY LIMITATIONS cleaning and occupation.   PERSONAL FACTORS Age, Fitness, and 1 comorbidity:    are also affecting  patient's functional outcome.    REHAB POTENTIAL: Good  CLINICAL DECISION MAKING: Stable/uncomplicated  EVALUATION COMPLEXITY: Low   GOALS: Goals reviewed with patient? Yes  SHORT TERM GOALS: Target date: 12/13/21  Pt. Will be independent with core stability/ TrA ex. Program to improve pain-free mobility at home.  Baseline: Pt. Participates with yoga. Goal status: INITIAL   LONG TERM GOALS: Target date: 01/10/22  Pt. Will increase FOTO to 73 to improve pain-free mobility.  Baseline: initail 61 Goal status: INITIAL  2.  Pt. Will reports no R posterior hamstring/LE symptoms consistently for 1 week to improve pain-free mobility.   Baseline: R posterior hamstring discomfort/ catching Goal status: INITIAL  3.  Pt. Will reports no low back/LE symptoms with sleeping at night. Baseline: increase discomfort with rolling in bed Goal status: INITIAL   PLAN: PT FREQUENCY: 1x/week  PT DURATION: 8 weeks  PLANNED INTERVENTIONS: Therapeutic exercises, Therapeutic activity, Neuromuscular re-education, Balance training, Patient/Family education, Joint mobilization, Dry Needling, Electrical stimulation, Cryotherapy, Moist heat, and Manual therapy.  PLAN FOR NEXT SESSION: Progress HEP  Pura Spice, PT, DPT # [phone] 12/10/2021, 7:46 PM

## 2021-12-13 ENCOUNTER — Encounter: Payer: Self-pay | Admitting: Physical Therapy

## 2021-12-13 ENCOUNTER — Ambulatory Visit: Payer: BC Managed Care – PPO | Admitting: Physical Therapy

## 2021-12-13 DIAGNOSIS — M6281 Muscle weakness (generalized): Secondary | ICD-10-CM | POA: Diagnosis not present

## 2021-12-13 DIAGNOSIS — M5441 Lumbago with sciatica, right side: Secondary | ICD-10-CM | POA: Diagnosis not present

## 2021-12-13 DIAGNOSIS — G8929 Other chronic pain: Secondary | ICD-10-CM | POA: Diagnosis not present

## 2021-12-13 DIAGNOSIS — M25562 Pain in left knee: Secondary | ICD-10-CM | POA: Diagnosis not present

## 2021-12-13 DIAGNOSIS — M545 Low back pain, unspecified: Secondary | ICD-10-CM | POA: Diagnosis not present

## 2021-12-13 NOTE — Progress Notes (Unsigned)
Daniel Lawrence [place] Daniel Lawrence: [phone] Subjective:   Fontaine No, am serving as a scribe for Dr. Hulan Saas.   I'm seeing this patient by the request  of:  Daniel Mc, MD  CC: Low back pain follow-up  OTR:[id number]  11/02/2021 Patient is having low back pain with radicular symptoms.  X-rays of the lumbar spine do show that patient has some L5-S1 severe arthritic changes with facet arthropathy.  Patient will be started on gabapentin.  Due to the potential mild weakness also noted and patient's past medical history for multiple cancers I do think that it is worthwhile for [place] to get advanced imaging.  We will get MRI.  Depending on findings we will discuss possible epidurals as well.  Patient knows of significant worsening pain to seek medical attention immediately  Updated 12/14/2021 DORRIAN Lawrence is a 61 y.o. male coming in with complaint of back pain. Patient was having weakness and does have the past medical history for cancer so was sent for a MRI of the lumbar spine.  MRI was independently visualized by me does have moderate to severe facet arthropathy of the lumbar spine from L3-S1.  No signs of any masses noted.  Patient states that he has had good and bad days. Today he has pain in R leg. Less severe than it once was and he does have days when he is not in pain. Takes '300mg'$  of gabapentin at night. Patient feels like is able to ambulate a bit easier on his good days as he will not have antalgic gait. Patient feels like PT has been very helpful.       Past Medical History:  Diagnosis Date   Allergy    Arthritis    left knee   Cancer ([place])    skin cancer   Cataract    Hyperlipidemia    Hypertension    Past Surgical History:  Procedure Laterality Date   BRAIN SURGERY     ELEVATION OF DEPRESSED SKULL FRACTURE  1971   traumatic blow to head by golf club   , [facility]   MOHS SURGERY  March 2013   left  temple, squamous   ROBOT ASSISTED LAPAROSCOPIC NEPHRECTOMY Right 04/20/2016   Procedure: XI ROBOTIC ASSISTED LAPAROSCOPIC RADICAL NEPHRECTOMY;  Surgeon: Alexis Frock, MD;  Location: [facility];  Service: Urology;  Laterality: Right;   Social History   Socioeconomic History   Marital status: Married    Spouse name: Not on file   Number of children: Not on file   Years of education: Not on file   Highest education level: Not on file  Occupational History   Not on file  Tobacco Use   Smoking status: Never   Smokeless tobacco: Never  Vaping Use   Vaping Use: Never used  Substance and Sexual Activity   Alcohol use: Yes    Alcohol/week: 6.0 standard drinks    Types: 6 Glasses of wine per week   Drug use: No   Sexual activity: Not on file  Other Topics Concern   Not on file  Social History Narrative   Not on file   Social Determinants of Health   Financial Resource Strain: Not on file  Food Insecurity: Not on file  Transportation Needs: Not on file  Physical Activity: Not on file  Stress: Not on file  Social Connections: Not on file   Allergies  Allergen Reactions   Sulfa Antibiotics Rash  Family History  Problem Relation Age of Onset   Alzheimer's disease Mother    Heart disease Father        after age 16   Alzheimer's disease Maternal Aunt    Alzheimer's disease Maternal Uncle    Cancer Maternal Grandmother        breast   Cancer Maternal Grandfather        lung,  tobacco abuse   Colon cancer Neg Hx    Esophageal cancer Neg Hx    Stomach cancer Neg Hx      Current Outpatient Medications (Cardiovascular):    losartan (COZAAR) 50 MG tablet, TAKE 1 TABLET BY MOUTH EVERY DAY   rosuvastatin (CRESTOR) 10 MG tablet, TAKE 1 TABLET BY MOUTH EVERY DAY   rosuvastatin (CRESTOR) 5 MG tablet, TAKE 1 TABLET (5 MG TOTAL) BY MOUTH DAILY.   sildenafil (VIAGRA) 50 MG tablet, Take 1 tablet (50 mg total) by mouth daily as needed for erectile dysfunction.  Current Outpatient  Medications (Respiratory):    fluticasone (FLONASE) 50 MCG/ACT nasal spray, Place 2 sprays into both nostrils daily. (Patient taking differently: Place 1 spray into both nostrils daily.)   loratadine (CLARITIN) 10 MG tablet, Take 10 mg by mouth daily as needed for allergies.     Current Outpatient Medications (Other):    Black Pepper-Turmeric (TURMERIC CURCUMIN) 11-998 MG CAPS,    Cholecalciferol (VITAMIN D-3) 1000 units CAPS,    Coenzyme Q10 100 MG capsule, Take 100 mg by mouth daily.   gabapentin (NEURONTIN) 100 MG capsule, Take 2 capsules (200 mg total) by mouth at bedtime.   Reviewed prior external information including notes and imaging from  primary care provider As well as notes that were available from care everywhere and other healthcare systems.  Past medical history, social, surgical and family history all reviewed in electronic medical record.  No pertanent information unless stated regarding to the chief complaint.   Review of Systems:  No headache, visual changes, nausea, vomiting, diarrhea, constipation, dizziness, abdominal pain, skin rash, fevers, chills, night sweats, weight loss, swollen lymph nodes, body aches, joint swelling, chest pain, shortness of breath, mood changes. POSITIVE muscle aches  Objective  There were no vitals taken for this visit.   General: No apparent distress alert and oriented x3 mood and affect normal, dressed appropriately.  HEENT: Pupils equal, extraocular movements intact  Respiratory: Patient's speak in full sentences and does not appear short of breath  Cardiovascular: No lower extremity edema, non tender, no erythema  Gait  MSK:      Impression and Recommendations:     The above documentation has been reviewed and is accurate and complete [name], DO

## 2021-12-13 NOTE — Therapy (Signed)
OUTPATIENT PHYSICAL THERAPY THORACOLUMBAR TREATMENT   Patient Name: Daniel Lawrence MRN: [id number] DOB:Apr 06, 1961, 61 y.o., male Today's Date: 12/13/2021   PT End of Session - 12/13/21 0906     Visit Number 6    Number of Visits 9    Date for PT Re-Evaluation 01/10/22    PT Start Time 0902    PT Stop Time 0951    PT Time Calculation (min) 49 min    Activity Tolerance Patient tolerated treatment well    Behavior During Therapy [facility] for tasks assessed/performed             Past Medical History:  Diagnosis Date   Allergy    Arthritis    left knee   Cancer ([place])    skin cancer   Cataract    Hyperlipidemia    Hypertension    Past Surgical History:  Procedure Laterality Date   BRAIN SURGERY     ELEVATION OF DEPRESSED SKULL FRACTURE  1971   traumatic blow to head by golf club   , [facility])   MOHS SURGERY  March 2013   left temple, squamous   ROBOT ASSISTED LAPAROSCOPIC NEPHRECTOMY Right 04/20/2016   Procedure: XI ROBOTIC ASSISTED LAPAROSCOPIC RADICAL NEPHRECTOMY;  Surgeon: Alexis Frock, MD;  Location: [facility];  Service: Urology;  Laterality: Right;   Patient Active Problem List   Diagnosis Date Noted   Low back pain 09/15/2021   Cancer ([place]) 06/29/2021   Mass of soft tissue of shoulder 06/29/2021   Mass of soft tissue of neck 06/29/2021   Aortic atherosclerosis ([place]) 04/18/2021   Melanoma in situ of right upper arm ([place]) 04/18/2021   H/O right nephrectomy 04/17/2020   Loss of libido 04/14/2019   Degenerative arthritis of left knee 02/25/2018   Chronic pain of left knee 02/08/2018   CKD (chronic kidney disease) stage 3, GFR 30-59 ml/min (HCC) 02/08/2018   Coronary atherosclerosis due to lipid rich plaque 05/26/2016   History of renal cell carcinoma 04/20/2016   B12 deficiency 04/08/2016   Encounter for preventive health examination 06/24/2014   Hypertension 06/23/2014   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    THERAPY DIAG:  Chronic bilateral low back pain with right-sided sciatica  Muscle weakness (generalized)  ONSET DATE: 07/16/21  SUBJECTIVE:  SUBJECTIVE STATEMENT: Pt. Reports no sciatic symptoms over past several days.  Pt. Is continuing to take a higher dose of Gabapentin.  Pt. Is sleeping longer at night and not waking up to use bathroom.  Pt. States intensity of pain with walking has been better overall.     PERTINENT HISTORY:  11/02/21 MD visit CC: Low back pain, left knee pain   HPI: Subjective    09/14/2021 Patient does give a episode of potential radiculopathy.  Discussed with patient about icing regimen and home exercises, discussed which activities to do and which ones to avoid.  Once to start with home exercises which was given to him by athletic trainer.  Follow-up again in 6 weeks   Arthritic changes of the knees bilaterally.  Discussed the possibility of injections.  Discussed icing regimen and home exercises.  Patient feels like he is doing relatively well at the moment. Discussed HEP we will get bilateral standing x-rays.  Follow-up again in 4 to 6 weeks.  Worsening pain consider formal physical therapy or injections.   Updated 11/02/2021 STEIN WINDHORST is a 62 y.o. male coming in with complaint of LBP and left knee pain. Patient states that he continues to have sciatic nerve pain in R leg that is occurring more frequently. Would like to go to PT to be held accountable. Does not want to focus on knee pain at this time as his R hip and leg pain is more  prevalent.        TODAY'S TREATMENT   12/13/21:  There.ex.:  Scifit L6 5 min. F/b (warm-up/discussed back symptoms/ playing golf)  Walking in hallway with gait reassessment.    Nautilus: walk outs/ press 50# 10x2 with cuing for TrA muscle activation/ upright posture.    Quadruped on blue mat table (10x each): cat-cow/ bird dog with focus on TrA muscle activation 10x each (UE only/ LE only/ alt. UE and LE).     Manual tx.:  Supine R LE neural glides (flossing technique).  3x on L/R  Supine knee to chest/ trunk rotn. Stretches (no increase in symptoms).  Prone grade II-III PA mobs. To mid-thoracic spine (moderate hypomobility)- 2x20 sec.  STM to thoracic/lumbar paraspinals.  No Hypervolt today.  Reassessment of thoracic mobility in seated posture.    Discussed returning to riding bike at home but be aware of upright posture.     12/06/21:  There.ex.:  Scifit L6 10 min. B UE/LE (consistent cadence)- warm up.    Seated ball ex.: wt. Shifting/ pelvic tilts/ marching/ LAQ/ alt. UE and LE (mirror feedback).  Good TrA muscle control/ balance.    Nautilus: 50# Pallof press 5x each direction x3.  50# scap. Retraction.  Supine ball: knee to chest/ bridging/ dead bug.  Supine open book thoracic/lumbar stretches L/R  Manual tx.:  Supine R LE neural glides (flossing technique).  Active knee extension (as tolerated).  Supine Thomas stretches with over pressure (increase L SI tenderness).     Prone grade II-III PA mobs. To mid-thoracic spine (moderate hypomobility)- 2x20 sec.  STM to thoracic/lumbar paraspinals/ use of Hypervolt (good tx. Tolerance).    Prone press-ups/ hip extension (no pain).    12/04/21:   There.ex:  Scifit L5 10 min. B UE/LE.  Quad/LE muscle fatigue reported.    Supine TrA ex.: bridging with ball/ knee to chest/ trunk rotn.  Supine 1/2 bolster thoracic extension on mat table (varying levels)  Manual tx.:  Supine R LE neural glides (flossing  technique).  Active knee extension (as tolerated)   Supine knee to chest/ trunk rotn. Stretches with overpressure.  Prone grade II-III PA mobs. To mid-thoracic spine (moderate hypomobility)- 2x20 sec.  STM to thoracic/lumbar paraspinals/ use of Hypervolt (good tx. Tolerance).  Seated thoracic rotn. (WNL).     Prone press-ups/ hip extension (no pain).      PATIENT EDUCATION:  Education details: Nerve glides/ Ex. Program (Access Code: [email or web address]) Person educated: Patient Education method: Explanation Education comprehension: verbalized understanding   HOME EXERCISE PROGRAM: Access Code: [email or web address] URL: [email or web address] Date: 11/22/2021 Prepared by: Dorcas Carrow   Exercises - Hooklying Transversus Abdominis Palpation  - 1 x daily - 7 x weekly - 1 sets - 10 reps - Supine March  - 1 x daily - 7 x weekly - 2 sets - 10 reps - Supine Bridge  - 1 x daily - 7 x weekly - 2 sets - 10 reps - Bridge with Hip Abduction and Resistance - Ground Touches  - 1 x daily - 7 x weekly - 2 sets - 10 reps - Supine Sciatic Nerve Glide  - 1 x daily - 7 x weekly - 3 sets - 4 reps - Thoracic Extension Mobilization on Foam Roll  - 1 x daily - 7 x weekly - 3 sets - 10 reps - Thoracic Extension Mobilization on Foam Roll  - 1 x daily - 7 x weekly - 1 sets - 3 reps - Seated Thoracic Lumbar Extension  - 1 x daily - 7 x weekly - 1 sets - 3 reps  ASSESSMENT:  CLINICAL IMPRESSION: 12/13/21  Moderate thoracic hypomobility present during quadruped flexion/extension interventions. Pt exhibited good abdominal activation during bilateral bird-dog interventions, with trA, internal oblique, and external oblique engagement. Pt also engaged core musculature well during upper extremity paloff press, without increase of pain or discomfort. Pt demonstrated hypermobility in lumbar spine, and will continue to strengthen his core and paraspinals to increase his stability in his lower back. Pt will continue to focus  on abdominal strengthening, increasing thoracic mobility, and further focusing on lumbar stability, in order to continue activities of daily living without increased pain or discomfort.    OBJECTIVE IMPAIRMENTS decreased endurance, decreased mobility, decreased strength, hypomobility, and pain.   ACTIVITY LIMITATIONS cleaning and occupation.   PERSONAL FACTORS Age, Fitness, and 1 comorbidity:    are also affecting patient's functional outcome.    REHAB POTENTIAL: Good  CLINICAL DECISION MAKING: Stable/uncomplicated  EVALUATION COMPLEXITY: Low   GOALS: Goals reviewed with patient? Yes  SHORT TERM GOALS: Target date: 12/13/21  Pt. Will be independent with core stability/ TrA ex. Program to improve pain-free mobility at home.  Baseline: Pt. Participates with yoga. Goal status: Goal met   LONG TERM GOALS: Target date: 01/10/22  Pt. Will increase FOTO to 73 to improve pain-free mobility.  Baseline: initail 61 Goal status: INITIAL  2.  Pt. Will reports no R posterior hamstring/LE symptoms consistently for 1 week to improve pain-free mobility.   Baseline: R posterior hamstring discomfort/ catching Goal status: INITIAL  3.  Pt. Will reports no low back/LE symptoms with sleeping at night. Baseline: increase discomfort with rolling in bed Goal status: INITIAL   PLAN: PT FREQUENCY: 1x/week  PT DURATION: 8 weeks  PLANNED INTERVENTIONS: Therapeutic exercises, Therapeutic activity, Neuromuscular re-education, Balance training, Patient/Family education, Joint mobilization, Dry Needling, Electrical stimulation, Cryotherapy, Moist heat, and Manual therapy.  PLAN FOR NEXT SESSION: Progress HEP  Pura Spice, PT, DPT # [phone] 12/13/2021, 10:03 AM

## 2021-12-14 ENCOUNTER — Encounter: Payer: Self-pay | Admitting: Family Medicine

## 2021-12-14 ENCOUNTER — Ambulatory Visit (INDEPENDENT_AMBULATORY_CARE_PROVIDER_SITE_OTHER): Payer: BC Managed Care – PPO | Admitting: Family Medicine

## 2021-12-14 DIAGNOSIS — M5441 Lumbago with sciatica, right side: Secondary | ICD-10-CM | POA: Diagnosis not present

## 2021-12-14 DIAGNOSIS — G8929 Other chronic pain: Secondary | ICD-10-CM

## 2021-12-14 NOTE — Patient Instructions (Signed)
Glad you are doing well Keep doing PT once a week for another 8 weeks Next time you need refill of gabapentin we will do '300mg'$  See me in 3 months

## 2021-12-15 NOTE — Assessment & Plan Note (Addendum)
Chronic, patient is responding well to the conservative therapy.  Patient has the gabapentin and can titrate up if necessary.  We discussed icing regimen and home exercises.  No significant changes in management for now and follow-up again in 2 months if worsening symptoms can consider the possibility of injection discussed

## 2021-12-18 ENCOUNTER — Encounter: Payer: Self-pay | Admitting: Physical Therapy

## 2021-12-18 ENCOUNTER — Ambulatory Visit: Payer: BC Managed Care – PPO | Attending: Family Medicine | Admitting: Physical Therapy

## 2021-12-18 DIAGNOSIS — G8929 Other chronic pain: Secondary | ICD-10-CM | POA: Diagnosis not present

## 2021-12-18 DIAGNOSIS — M6281 Muscle weakness (generalized): Secondary | ICD-10-CM | POA: Diagnosis not present

## 2021-12-18 DIAGNOSIS — M5441 Lumbago with sciatica, right side: Secondary | ICD-10-CM | POA: Diagnosis not present

## 2021-12-18 NOTE — Therapy (Signed)
OUTPATIENT PHYSICAL THERAPY THORACOLUMBAR TREATMENT   Patient Name: Daniel Lawrence MRN: [id number] DOB:11-06-1960, 61 y.o., male Today's Date: 12/18/2021   PT End of Session - 12/18/21 1443     Visit Number 7    Number of Visits 9    Date for PT Re-Evaluation 01/10/22    PT Start Time 5643    PT Stop Time 1518    PT Time Calculation (min) 50 min    Activity Tolerance Patient tolerated treatment well    Behavior During Therapy WFL for tasks assessed/performed             Past Medical History:  Diagnosis Date   Allergy    Arthritis    left knee   Cancer ([place])    skin cancer   Cataract    Hyperlipidemia    Hypertension    Past Surgical History:  Procedure Laterality Date   BRAIN SURGERY     ELEVATION OF DEPRESSED SKULL FRACTURE  1971   traumatic blow to head by golf club   , [facility])   MOHS SURGERY  March 2013   left temple, squamous   ROBOT ASSISTED LAPAROSCOPIC NEPHRECTOMY Right 04/20/2016   Procedure: XI ROBOTIC ASSISTED LAPAROSCOPIC RADICAL NEPHRECTOMY;  Surgeon: Alexis Frock, MD;  Location: [facility];  Service: Urology;  Laterality: Right;   Patient Active Problem List   Diagnosis Date Noted   Low back pain 09/15/2021   Cancer ([place]) 06/29/2021   Mass of soft tissue of shoulder 06/29/2021   Mass of soft tissue of neck 06/29/2021   Aortic atherosclerosis ([place]) 04/18/2021   Melanoma in situ of right upper arm ([place]) 04/18/2021   H/O right nephrectomy 04/17/2020   Loss of libido 04/14/2019   Degenerative arthritis of left knee 02/25/2018   Chronic pain of left knee 02/08/2018   CKD (chronic kidney disease) stage 3, GFR 30-59 ml/min (HCC) 02/08/2018   Coronary atherosclerosis due to lipid rich plaque 05/26/2016   History of renal cell carcinoma 04/20/2016   B12 deficiency 04/08/2016   Encounter for preventive health examination 06/24/2014   Hypertension 06/23/2014   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    THERAPY DIAG:  Chronic bilateral low back pain with right-sided sciatica  Muscle weakness (generalized)  ONSET DATE: 07/16/21  SUBJECTIVE:  SUBJECTIVE STATEMENT: Pt. continues to take a higher dose of Gabapentin.  PT discussed MD f/u with Dr. Tamala Julian (PT may decrease frequency to 1x/week in a couple weeks)- see MD note.  Pt. Reports no pain during walking or Yoga ex.  Pt. Has R LE symptoms during turning over in bed.   Pt. Rode bicycle at home and enjoyed with no pain.     PERTINENT HISTORY:  11/02/21 MD visit CC: Low back pain, left knee pain   HPI: Subjective    09/14/2021 Patient does give a episode of potential radiculopathy.  Discussed with patient about icing regimen and home exercises, discussed which activities to do and which ones to avoid.  Once to start with home exercises which was given to him by athletic trainer.  Follow-up again in 6 weeks   Arthritic changes of the knees bilaterally.  Discussed the possibility of injections.  Discussed icing regimen and home exercises.  Patient feels like he is doing relatively well at the moment. Discussed HEP we will get bilateral standing x-rays.  Follow-up again in 4 to 6 weeks.  Worsening pain consider formal physical therapy or injections.   Updated 11/02/2021 Daniel Lawrence is a 61 y.o. male coming in with complaint of LBP and left knee pain. Patient states that he continues to have sciatic nerve pain in R leg that is occurring more frequently. Would like to go to PT to be held accountable. Does not want to focus on knee  pain at this time as his R hip and leg pain is more prevalent.        TODAY'S TREATMENT   12/18/21:   There.ex:  Scifit L6 10 min. B UE/LE.   No complaints with increase resistance.    Prone STM to mid-thoracic/lumbar paraspinals with massage cream.    Prone press-ups/ hip extension (no pain).   Prone hip IR isometrics with mod resistance 5x2  Prone hip ER isometrics with mod resistance 5x2     Prone hip extension 10x bilaterally without resistance   Prone Hip extension with mod resistance 5x1 bilaterally  Manual tx.:  Reassessment of hip/ spine mobility.    Supine R LE neural glides (flossing technique).    Supine knee to chest/ trunk rotn. Stretches with overpressure.  Prone grade II-III PA mobs. To mid-thoracic spine (moderate hypomobility)- 2x20 sec.  STM to thoracic/lumbar paraspinals/ use of Hypervolt (good tx. Tolerance).  Seated thoracic rotn. (WNL).        12/13/21:  There.ex.:  Scifit L6 5 min. F/b (warm-up/discussed back symptoms/ playing golf)  Walking in hallway with gait reassessment.    Nautilus: walk outs/ press 50# 10x2 with cuing for TrA muscle activation/ upright posture.    Quadruped on blue mat table (10x each): cat-cow/ bird dog with focus on TrA muscle activation 10x each (UE only/ LE only/ alt. UE and LE).     Manual tx.:  Supine R LE neural glides (flossing technique).  3x on L/R  Supine knee to chest/ trunk rotn. Stretches (no increase in symptoms).  Prone grade II-III PA mobs. To mid-thoracic spine (moderate hypomobility)- 2x20 sec.  STM to thoracic/lumbar paraspinals.  No Hypervolt today.  Reassessment of thoracic mobility in seated posture.    Discussed returning to riding bike at home but be aware of upright posture.     12/06/21:  There.ex.:  Scifit L6 10 min. B UE/LE (consistent cadence)- warm up.    Seated ball ex.: wt. Shifting/ pelvic tilts/ marching/ LAQ/ alt. UE  and LE (mirror feedback).  Good TrA muscle control/  balance.    Nautilus: 50# Pallof press 5x each direction x3.  50# scap. Retraction.  Supine ball: knee to chest/ bridging/ dead bug.  Supine open book thoracic/lumbar stretches L/R  Manual tx.:  Supine R LE neural glides (flossing technique).  Active knee extension (as tolerated).  Supine Thomas stretches with over pressure (increase L SI tenderness).     Prone grade II-III PA mobs. To mid-thoracic spine (moderate hypomobility)- 2x20 sec.  STM to thoracic/lumbar paraspinals/ use of Hypervolt (good tx. Tolerance).    Prone press-ups/ hip extension (no pain).      PATIENT EDUCATION:  Education details: Nerve glides/ Ex. Program (Access Code: [email or web address]) Person educated: Patient Education method: Explanation Education comprehension: verbalized understanding   HOME EXERCISE PROGRAM: Access Code: [email or web address] URL: [email or web address] Date: 11/22/2021 Prepared by: Dorcas Carrow   Exercises - Hooklying Transversus Abdominis Palpation  - 1 x daily - 7 x weekly - 1 sets - 10 reps - Supine March  - 1 x daily - 7 x weekly - 2 sets - 10 reps - Supine Bridge  - 1 x daily - 7 x weekly - 2 sets - 10 reps - Bridge with Hip Abduction and Resistance - Ground Touches  - 1 x daily - 7 x weekly - 2 sets - 10 reps - Supine Sciatic Nerve Glide  - 1 x daily - 7 x weekly - 3 sets - 4 reps - Thoracic Extension Mobilization on Foam Roll  - 1 x daily - 7 x weekly - 3 sets - 10 reps - Thoracic Extension Mobilization on Foam Roll  - 1 x daily - 7 x weekly - 1 sets - 3 reps - Seated Thoracic Lumbar Extension  - 1 x daily - 7 x weekly - 1 sets - 3 reps  ASSESSMENT:  CLINICAL IMPRESSION: 12/13/21  Pt presents with moderate thoracic hypomobility present during P/A grade II/III mobs. Pt experienced increased "vibration" down the back of his R LE into his foot during mobs around T8-T10. Pt demonstrated typical to slight hypermobility in lumbar spine during P/A grade II/III mobs, without any pain  or discomfort in that region. Pt responded well to manual therapy of paraspinals, especially to his R thoracic region. Pt will continue to focus on abdominal strengthening and increasing thoracic mobility, in order to continue activities of daily living without increased pain or discomfort.    OBJECTIVE IMPAIRMENTS decreased endurance, decreased mobility, decreased strength, hypomobility, and pain.   ACTIVITY LIMITATIONS cleaning and occupation.   PERSONAL FACTORS Age, Fitness, and 1 comorbidity:    are also affecting patient's functional outcome.    REHAB POTENTIAL: Good  CLINICAL DECISION MAKING: Stable/uncomplicated  EVALUATION COMPLEXITY: Low   GOALS: Goals reviewed with patient? Yes  SHORT TERM GOALS: Target date: 12/13/21  Pt. Will be independent with core stability/ TrA ex. Program to improve pain-free mobility at home.  Baseline: Pt. Participates with yoga. Goal status: Goal met   LONG TERM GOALS: Target date: 01/10/22  Pt. Will increase FOTO to 73 to improve pain-free mobility.  Baseline: initail 61 Goal status: INITIAL  2.  Pt. Will reports no R posterior hamstring/LE symptoms consistently for 1 week to improve pain-free mobility.   Baseline: R posterior hamstring discomfort/ catching Goal status: INITIAL  3.  Pt. Will reports no low back/LE symptoms with sleeping at night. Baseline: increase discomfort with rolling in bed Goal status: INITIAL   PLAN: PT FREQUENCY: 1-2 x per  week  PT DURATION: 8 weeks  PLANNED INTERVENTIONS: Therapeutic exercises, Therapeutic activity, Neuromuscular re-education, Balance training, Patient/Family education, Joint mobilization, Dry Needling, Electrical stimulation, Cryotherapy, Moist heat, and Manual therapy.  PLAN FOR NEXT SESSION: Manual therapy to thoracic paraspinals, nerve glides to LE's, and lumbar stabilization via core strengthening.   Andee Lineman, SPT Pura Spice, PT, DPT # [phone] 12/18/2021, 4:02 PM

## 2021-12-20 ENCOUNTER — Ambulatory Visit: Payer: BC Managed Care – PPO | Admitting: Physical Therapy

## 2021-12-20 DIAGNOSIS — M5441 Lumbago with sciatica, right side: Secondary | ICD-10-CM | POA: Diagnosis not present

## 2021-12-20 DIAGNOSIS — G8929 Other chronic pain: Secondary | ICD-10-CM | POA: Diagnosis not present

## 2021-12-20 DIAGNOSIS — M6281 Muscle weakness (generalized): Secondary | ICD-10-CM

## 2021-12-20 NOTE — Therapy (Addendum)
OUTPATIENT PHYSICAL THERAPY THORACOLUMBAR TREATMENT   Patient Name: Daniel Lawrence MRN: [id number] DOB:November 13, 1960, 61 y.o., male Today's Date: 12/20/2021   PT End of Session - 12/20/21 1629     Visit Number 8    Number of Visits 9    Date for PT Re-Evaluation 01/10/22    PT Start Time 1301    PT Stop Time 1351    PT Time Calculation (min) 50 min    Activity Tolerance Patient tolerated treatment well              Past Medical History:  Diagnosis Date   Allergy    Arthritis    left knee   Cancer ([place])    skin cancer   Cataract    Hyperlipidemia    Hypertension    Past Surgical History:  Procedure Laterality Date   BRAIN SURGERY     ELEVATION OF DEPRESSED SKULL FRACTURE  1971   traumatic blow to head by golf club   , [facility])   MOHS SURGERY  March 2013   left temple, squamous   ROBOT ASSISTED LAPAROSCOPIC NEPHRECTOMY Right 04/20/2016   Procedure: XI ROBOTIC ASSISTED LAPAROSCOPIC RADICAL NEPHRECTOMY;  Surgeon: Alexis Frock, MD;  Location: [facility];  Service: Urology;  Laterality: Right;   Patient Active Problem List   Diagnosis Date Noted   Low back pain 09/15/2021   Cancer ([place]) 06/29/2021   Mass of soft tissue of shoulder 06/29/2021   Mass of soft tissue of neck 06/29/2021   Aortic atherosclerosis ([place]) 04/18/2021   Melanoma in situ of right upper arm ([place]) 04/18/2021   H/O right nephrectomy 04/17/2020   Loss of libido 04/14/2019   Degenerative arthritis of left knee 02/25/2018   Chronic pain of left knee 02/08/2018   CKD (chronic kidney disease) stage 3, GFR 30-59 ml/min (HCC) 02/08/2018   Coronary atherosclerosis due to lipid rich plaque 05/26/2016   History of renal cell carcinoma 04/20/2016   B12 deficiency 04/08/2016   Encounter for preventive health examination 06/24/2014   Hypertension 06/23/2014   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    THERAPY DIAG:  Chronic bilateral low back pain with right-sided sciatica  Muscle weakness (generalized)  ONSET DATE: 07/16/21  SUBJECTIVE:  SUBJECTIVE STATEMENT: Pt continues to take a higher dose of Gabapentin, and states that it has been helping manage his pain. Pt. Reports no pain during walking or yoga, but often experiences a sharp pain in the back of his knee while getting out of his car or getting out of bed.  Pt has had a hard time recreating his symptoms, and it only occurs after prolonged periods of sitting or laying down.    PERTINENT HISTORY:  11/02/21 MD visit CC: Low back pain, left knee pain   HPI: Subjective    09/14/2021 Patient does give a episode of potential radiculopathy.  Discussed with patient about icing regimen and home exercises, discussed which activities to do and which ones to avoid.  Once to start with home exercises which was given to him by athletic trainer.  Follow-up again in 6 weeks   Arthritic changes of the knees bilaterally.  Discussed the possibility of injections.  Discussed icing regimen and home exercises.  Patient feels like he is doing relatively well at the moment. Discussed HEP we will get bilateral standing x-rays.  Follow-up again in 4 to 6 weeks.  Worsening pain consider formal physical therapy or injections.   Updated 11/02/2021 [name] is a 61 y.o. male coming in with complaint of LBP and left knee pain. Patient states that he continues to have sciatic nerve pain in R leg that is occurring more frequently. Would like to go to PT to be held accountable. Does not want to focus on  knee pain at this time as his R hip and leg pain is more prevalent.        TODAY'S TREATMENT   12/20/21:  Scifit L6 10 min B UE/LE  Manual Therapy:  Grade II/III pulsed humeral head distraction from R hip with flexed hip and flexed knee x 10   Grade III long axis distraction of R LE - 3 x 30 s hold   STM along the midline of hamstring muscle belly with massage cream  Palpation to bakers cyst on posterior R LE, no pain associated  Trigger point STM along medial hamstring proximal to distal   Lacrosse ball massage to hamstring in long sitting  Prolonged hamstring stretch in a lunge position, overpressure at end range - hold for 20 s bilaterally x 4  Discussed HEP, and educated on the importance of stretching his hamstrings and maintaining mobility in his hips and pelvis daily.    12/18/21:   There.ex:  Scifit L6 10 min. B UE/LE.   No complaints with increase resistance.    Prone STM to mid-thoracic/lumbar paraspinals with massage cream.    Prone press-ups/ hip extension (no pain).   Prone hip IR isometrics with mod resistance 5x2  Prone hip ER isometrics with mod resistance 5x2     Prone hip extension 10x bilaterally without resistance   Prone Hip extension with mod resistance 5x1 bilaterally  Manual tx.:  Reassessment of hip/ spine mobility.    Supine R LE neural glides (flossing technique).    Supine knee to chest/ trunk rotn. Stretches with overpressure.  Prone grade II-III PA mobs. To mid-thoracic spine (moderate hypomobility)- 2x20 sec.  STM to thoracic/lumbar paraspinals/ use of Hypervolt (good tx. Tolerance).  Seated thoracic rotn. (WNL).      PATIENT EDUCATION:  Education details: Nerve glides/ Ex. Program (Access Code: [email or web address]) Person educated: Patient Education method: Explanation Education comprehension: verbalized understanding   HOME EXERCISE PROGRAM: Access Code: [email or web address] URL: [email or web address] Date: 11/22/2021 Prepared  by: Dorcas Carrow   Exercises - Hooklying Transversus Abdominis Palpation  - 1 x daily - 7 x weekly - 1 sets - 10 reps - Supine March  - 1 x daily - 7 x weekly - 2 sets - 10 reps - Supine Bridge  - 1 x daily - 7 x weekly - 2 sets - 10 reps - Bridge with Hip Abduction and Resistance - Ground Touches  - 1 x daily - 7 x weekly - 2 sets - 10 reps - Supine Sciatic Nerve Glide  - 1 x daily - 7 x weekly - 3 sets - 4 reps - Thoracic Extension Mobilization on Foam Roll  - 1 x daily - 7 x weekly - 3 sets - 10 reps - Thoracic Extension Mobilization on Foam Roll  - 1 x daily - 7 x weekly - 1 sets - 3 reps - Seated Thoracic Lumbar Extension  - 1 x daily - 7 x weekly - 1 sets - 3 reps  ASSESSMENT:  CLINICAL IMPRESSION: 12/20/21  Pt experienced increased discomfort in his posterior R knee, and occasionally felt it radiate into his R foot when he flexed and externally rotated his R hip. Patient had tenderness to manual therapy on his hamstrings, predominantly along the midline of his muscle belly, which reproduced symptoms that he has been experiencing at home. Pt's discomfort aligns with the sciatic nerve distribution, and is further induced when it is palpated, or when the hip flexes and externally rotates. Pt did not feel that a foam roller or a lacrosse ball adequately reproduced or relieved symptomology, and manual therapy was preferred. Pt stated that he will have his wife perform massage at home to his hamstrings, and he will focus on hip mobility in upcomming yoga classes. Pt will benefit from skilled PT; involving joint mobilizations of spine and pelvis, core stability, sciatic nerve glides, and hamstring STM.    OBJECTIVE IMPAIRMENTS decreased endurance, decreased mobility, decreased strength, hypomobility, and pain.   ACTIVITY LIMITATIONS cleaning and occupation.   PERSONAL FACTORS Age, Fitness, and 1 comorbidity:    are also affecting patient's functional outcome.    REHAB POTENTIAL:  Good  CLINICAL DECISION MAKING: Stable/uncomplicated  EVALUATION COMPLEXITY: Low   GOALS: Goals reviewed with patient? Yes  SHORT TERM GOALS: Target date: 12/13/21  Pt. Will be independent with core stability/ TrA ex. Program to improve pain-free mobility at home.  Baseline: Pt. Participates with yoga. Goal status: Goal met   LONG TERM GOALS: Target date: 01/10/22  Pt. Will increase FOTO to 73 to improve pain-free mobility.  Baseline: initail 61 Goal status: INITIAL  2.  Pt. Will reports no R posterior hamstring/LE symptoms consistently for 1 week to improve pain-free mobility.   Baseline: R posterior hamstring discomfort/ catching Goal status: INITIAL  3.  Pt. Will reports no low back/LE symptoms with sleeping at night. Baseline: increase discomfort with rolling in bed Goal status: INITIAL   PLAN: PT FREQUENCY: 1-2 x per week  PT DURATION: 8 weeks  PLANNED INTERVENTIONS: Therapeutic exercises, Therapeutic activity, Neuromuscular re-education, Balance training, Patient/Family education, Joint mobilization, Dry Needling, Electrical stimulation, Cryotherapy, Moist heat, and Manual therapy.  PLAN FOR NEXT SESSION: Manual therapy to hamstrings, nerve glides to LE's, joint distraction to R hip, and lumbar stabilization via core strengthening.  CHECK GOALS   Pura Spice, PT, DPT # [id number] Andee Lineman, SPT 12/21/2021, 7:44 AM

## 2021-12-21 ENCOUNTER — Encounter: Payer: Self-pay | Admitting: Physical Therapy

## 2021-12-21 ENCOUNTER — Encounter: Payer: BC Managed Care – PPO | Admitting: Physical Therapy

## 2021-12-25 ENCOUNTER — Ambulatory Visit: Payer: BC Managed Care – PPO | Admitting: Physical Therapy

## 2021-12-25 ENCOUNTER — Encounter: Payer: Self-pay | Admitting: Physical Therapy

## 2021-12-25 DIAGNOSIS — G8929 Other chronic pain: Secondary | ICD-10-CM

## 2021-12-25 DIAGNOSIS — M6281 Muscle weakness (generalized): Secondary | ICD-10-CM | POA: Diagnosis not present

## 2021-12-25 DIAGNOSIS — M5441 Lumbago with sciatica, right side: Secondary | ICD-10-CM | POA: Diagnosis not present

## 2021-12-25 NOTE — Therapy (Addendum)
OUTPATIENT PHYSICAL THERAPY THORACOLUMBAR TREATMENT   Patient Name: JIMMY PLESSINGER MRN: [id number] DOB:[birth year], 61 y.o., male Today's Date: 12/25/2021   PT End of Session - 12/25/21 1436     Visit Number 9    Number of Visits 9    Date for PT Re-Evaluation 01/10/22    PT Start Time 2527    PT Stop Time 1528    PT Time Calculation (min) 50 min    Activity Tolerance Patient tolerated treatment well    Behavior During Therapy WFL for tasks assessed/performed              Past Medical History:  Diagnosis Date   Allergy    Arthritis    left knee   Cancer ([place])    skin cancer   Cataract    Hyperlipidemia    Hypertension    Past Surgical History:  Procedure Laterality Date   BRAIN SURGERY     ELEVATION OF DEPRESSED SKULL FRACTURE  1971   traumatic blow to head by golf club   , [facility])   MOHS SURGERY  March 2013   left temple, squamous   ROBOT ASSISTED LAPAROSCOPIC NEPHRECTOMY Right 04/20/2016   Procedure: XI ROBOTIC ASSISTED LAPAROSCOPIC RADICAL NEPHRECTOMY;  Surgeon: Alexis Frock, MD;  Location: [facility];  Service: Urology;  Laterality: Right;   Patient Active Problem List   Diagnosis Date Noted   Low back pain 09/15/2021   Cancer ([place]) 06/29/2021   Mass of soft tissue of shoulder 06/29/2021   Mass of soft tissue of neck 06/29/2021   Aortic atherosclerosis ([place]) 04/18/2021   Melanoma in situ of right upper arm ([place]) 04/18/2021   H/O right nephrectomy 04/17/2020   Loss of libido 04/14/2019   Degenerative arthritis of left knee 02/25/2018   Chronic pain of left knee 02/08/2018   CKD (chronic kidney disease) stage 3, GFR 30-59 ml/min (HCC) 02/08/2018   Coronary atherosclerosis due to lipid rich plaque 05/26/2016   History of renal cell carcinoma 04/20/2016   B12 deficiency 04/08/2016   Encounter for preventive health examination 06/24/2014   Hypertension 06/23/2014   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    THERAPY DIAG:  Muscle weakness (generalized)  Chronic bilateral low back pain with right-sided sciatica  ONSET DATE: 07/16/21  SUBJECTIVE:  SUBJECTIVE STATEMENT: Pt presented to the clinic with decreased pain and discomfort. Pt had soreness for one day after last tx, but felt remarkably better 36-48 hours afterwards. Pt is in 0-1/10 pain during all ADLs, and has rarely been able to reproduce his symptomology. Pt's wife has been providing manual therapy to his posterior hamstring, which has been relieving the pt's discomfort. Pt also bought a small 4-prong massage gun to utilize on the area, which he will try this week.    PERTINENT HISTORY:  11/02/21 MD visit CC: Low back pain, left knee pain   HPI: Subjective    09/14/2021 Patient does give a episode of potential radiculopathy.  Discussed with patient about icing regimen and home exercises, discussed which activities to do and which ones to avoid.  Once to start with home exercises which was given to him by athletic trainer.  Follow-up again in 6 weeks   Arthritic changes of the knees bilaterally.  Discussed the possibility of injections.  Discussed icing regimen and home exercises.  Patient feels like he is doing relatively well at the moment. Discussed HEP we will get bilateral standing x-rays.  Follow-up again in 4 to 6 weeks.  Worsening pain consider formal physical therapy or injections.   Updated 11/02/2021 AUGUSTO DECKMAN is a 61 y.o. male coming in with complaint of LBP and left knee pain. Patient states that he  continues to have sciatic nerve pain in R leg that is occurring more frequently. Would like to go to PT to be held accountable. Does not want to focus on knee pain at this time as his R hip and leg pain is more prevalent.        TODAY'S TREATMENT   12/25/21:  SciFit L6 10 min B UE/LE:  Manual Therapy: Supine: Grade 4 oscillating R hip distraction with distraction belt - 10 minutes Grade 4 oscillating R hip distraction manually, with knee in flexed position over SPT's shoulder - 10 mins Grade 4 long axis distraction on R LE: 4 X 10  Prone: Grade 3 thoracic, lumbar, and sacral P/A mobs - 15 s per vertebrae (moderate hypomobility) Hypervolt to hamstrings and paraspinals - 5 mins at hamstring and 5 mins to B paraspinals (thoracic and lumbar)  Discussed HEP, and educated on the importance of stretching his hamstrings and maintaining mobility in his hips and pelvis daily.    12/20/21:  Scifit L6 10 min B UE/LE  Manual Therapy:  Grade II/III pulsed humeral head distraction from R hip with flexed hip and flexed knee x 10   Grade III long axis distraction of R LE - 3 x 30 s hold   STM along the midline of hamstring muscle belly with massage cream  Palpation to bakers cyst on posterior R LE, no pain associated  Trigger point STM along medial hamstring proximal to distal   Lacrosse ball massage to hamstring in long sitting  Prolonged hamstring stretch in a lunge position, overpressure at end range - hold for 20 s bilaterally x 4  Discussed HEP, and educated on the importance of stretching his hamstrings and maintaining mobility in his hips and pelvis daily.    PATIENT EDUCATION:  Education details: Nerve glides/ Ex. Program (Access Code: [email or web address]) Person educated: Patient Education method: Explanation Education comprehension: verbalized understanding   HOME EXERCISE PROGRAM: Access Code: [email or web address] URL: [email or web address] Date: 11/22/2021 Prepared by:  Dorcas Carrow   Exercises - Hooklying Transversus Abdominis Palpation  - 1 x daily - 7 x weekly -  1 sets - 10 reps - Supine March  - 1 x daily - 7 x weekly - 2 sets - 10 reps - Supine Bridge  - 1 x daily - 7 x weekly - 2 sets - 10 reps - Bridge with Hip Abduction and Resistance - Ground Touches  - 1 x daily - 7 x weekly - 2 sets - 10 reps - Supine Sciatic Nerve Glide  - 1 x daily - 7 x weekly - 3 sets - 4 reps - Thoracic Extension Mobilization on Foam Roll  - 1 x daily - 7 x weekly - 3 sets - 10 reps - Thoracic Extension Mobilization on Foam Roll  - 1 x daily - 7 x weekly - 1 sets - 3 reps - Seated Thoracic Lumbar Extension  - 1 x daily - 7 x weekly - 1 sets - 3 reps  ASSESSMENT:  CLINICAL IMPRESSION: 12/20/21  Pt responded very well to hip distraction, and stated that he felt more mobile afterwards. Patient had mild tenderness to manual therapy on his hamstrings, predominantly along the midline and medial muscle belly, which reproduced the symptoms that he has been experiencing at home. Pt has been responding well to manual therapy at home from his wife, and stated that he may begin to see a massage therapist for his LE's. Pt was instructed to stretch out his pelvis during yoga classes, in order to reproduce slight joint distraction while at home. Pt will benefit from skilled PT; involving joint mobilizations of spine and pelvis, core stability, sciatic nerve glides, and hamstring STM.    OBJECTIVE IMPAIRMENTS decreased endurance, decreased mobility, decreased strength, hypomobility, and pain.   ACTIVITY LIMITATIONS cleaning and occupation.   PERSONAL FACTORS Age, Fitness, and 1 comorbidity:    are also affecting patient's functional outcome.    REHAB POTENTIAL: Good  CLINICAL DECISION MAKING: Stable/uncomplicated  EVALUATION COMPLEXITY: Low   GOALS: Goals reviewed with patient? Yes  SHORT TERM GOALS: Target date: 12/13/21  Pt. Will be independent with core stability/ TrA ex.  Program to improve pain-free mobility at home.  Baseline: Pt. Participates with yoga. Goal status: Goal met   LONG TERM GOALS: Target date: 01/10/22  Pt. Will increase FOTO to 73 to improve pain-free mobility.  Baseline: initail 61 Goal status: INITIAL  2.  Pt. Will reports no R posterior hamstring/LE symptoms consistently for 1 week to improve pain-free mobility.   Baseline: R posterior hamstring discomfort/ catching Goal status: INITIAL  3.  Pt. Will reports no low back/LE symptoms with sleeping at night. Baseline: increase discomfort with rolling in bed Goal status: INITIAL   PLAN: PT FREQUENCY: 1-2 x per week  PT DURATION: 8 weeks  PLANNED INTERVENTIONS: Therapeutic exercises, Therapeutic activity, Neuromuscular re-education, Balance training, Patient/Family education, Joint mobilization, Dry Needling, Electrical stimulation, Cryotherapy, Moist heat, and Manual therapy.  PLAN FOR NEXT SESSION: Manual therapy to hamstrings, nerve glides to LE's, joint distraction to R hip, and lumbar stabilization via core strengthening.  CHECK GOALS   Pura Spice, PT, DPT # [id number] Andee Lineman, SPT 12/25/2021, 7:20 PM

## 2021-12-27 ENCOUNTER — Ambulatory Visit: Payer: BC Managed Care – PPO | Admitting: Physical Therapy

## 2021-12-27 ENCOUNTER — Encounter: Payer: Self-pay | Admitting: Physical Therapy

## 2021-12-27 DIAGNOSIS — M6281 Muscle weakness (generalized): Secondary | ICD-10-CM

## 2021-12-27 DIAGNOSIS — G8929 Other chronic pain: Secondary | ICD-10-CM | POA: Diagnosis not present

## 2021-12-27 DIAGNOSIS — M5441 Lumbago with sciatica, right side: Secondary | ICD-10-CM

## 2021-12-27 NOTE — Therapy (Signed)
OUTPATIENT PHYSICAL THERAPY THORACOLUMBAR TREATMENT/RE-CERTIFICATION NOTE  Reporting Period: 11/15/21 - 12/27/21   Patient Name: Daniel Lawrence MRN: [id number] DOB:October 31, 1960, 61 y.o., male Today's Date: 12/25/2021   PT End of Session - 12/27/21 1013     Visit Number 10    Number of Visits 18    Date for PT Re-Evaluation 02/21/22    PT Start Time 0902    PT Stop Time 0945    PT Time Calculation (min) 43 min    Activity Tolerance Patient tolerated treatment well    Behavior During Therapy WFL for tasks assessed/performed               Past Medical History:  Diagnosis Date   Allergy    Arthritis    left knee   Cancer ([place])    skin cancer   Cataract    Hyperlipidemia    Hypertension    Past Surgical History:  Procedure Laterality Date   BRAIN SURGERY     ELEVATION OF DEPRESSED SKULL FRACTURE  1971   traumatic blow to head by golf club   , [facility])   MOHS SURGERY  March 2013   left temple, squamous   ROBOT ASSISTED LAPAROSCOPIC NEPHRECTOMY Right 04/20/2016   Procedure: XI ROBOTIC ASSISTED LAPAROSCOPIC RADICAL NEPHRECTOMY;  Surgeon: Alexis Frock, MD;  Location: [facility];  Service: Urology;  Laterality: Right;   Patient Active Problem List   Diagnosis Date Noted   Low back pain 09/15/2021   Cancer ([place]) 06/29/2021   Mass of soft tissue of shoulder 06/29/2021   Mass of soft tissue of neck 06/29/2021   Aortic atherosclerosis ([place]) 04/18/2021   Melanoma in situ of right upper arm ([place]) 04/18/2021   H/O right nephrectomy 04/17/2020   Loss of libido 04/14/2019   Degenerative arthritis of left knee 02/25/2018   Chronic pain of left knee 02/08/2018   CKD (chronic kidney disease) stage 3, GFR 30-59 ml/min (HCC) 02/08/2018   Coronary atherosclerosis due to lipid rich plaque 05/26/2016   History of renal cell carcinoma 04/20/2016   B12 deficiency 04/08/2016   Encounter for preventive health examination 06/24/2014   Hypertension 06/23/2014   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    THERAPY DIAG:  Muscle weakness (generalized)  Chronic bilateral low back pain with right-sided sciatica  ONSET DATE: 07/16/21  SUBJECTIVE:  SUBJECTIVE STATEMENT: Pt presented to the clinic with minimal pain and discomfort. Pt had soreness for one day after last tx. Soreness primarily with combined hip flexion and abduction noted with getting out of car and bed. Wife is helping pt with deep pressure massage to hamstring at home. States 4-prong massage gun has been helpful. 0-1/10 pain. States he has been riding his bike through his neighborhood.      PERTINENT HISTORY:  11/02/21 MD visit CC: Low back pain, left knee pain   HPI: Subjective    09/14/2021 Patient does give a episode of potential radiculopathy.  Discussed with patient about icing regimen and home exercises, discussed which activities to do and which ones to avoid.  Once to start with home exercises which was given to him by athletic trainer.  Follow-up again in 6 weeks   Arthritic changes of the knees bilaterally.  Discussed the possibility of injections.  Discussed icing regimen and home exercises.  Patient feels like he is doing relatively well at the moment. Discussed HEP we will get bilateral standing x-rays.  Follow-up again in 4 to 6 weeks.  Worsening pain consider formal physical therapy or injections.   Updated 11/02/2021 Daniel Lawrence is a 61 y.o. male coming in with complaint of LBP and left knee pain. Patient states that he  continues to have sciatic nerve pain in R leg that is occurring more frequently. Would like to go to PT to be held accountable. Does not want to focus on knee pain at this time as his R hip and leg pain is more prevalent.        TODAY'S TREATMENT   12/25/21:  SciFit L6 10 min B UE/LE:  Manual Therapy Supine: Grade 4 oscillating R hip distraction with distraction belt - 10 minutes Grade 4 oscillating R hip distraction manually, with knee in flexed position over SPT's shoulder - 10 minutes Grade 4 long axis distraction on R LE: 4 X 10  Prone: Grade 3 thoracic, lumbar, and sacral CPA - 15 s per vertebrae (moderate hypomobility) Hypervolt to right hamstring and bilateral T and L paraspinals - 5 mins each    Therapeutic Exercises Lateral lunge - light stretch felt  Sumo squat (full depth) with elbows pushing into greater hip abduction - mild stretch felt Lizard lunge - good stretch felt  -lizard lunge with quad stretch      12/20/21:  Scifit L6 10 min B UE/LE  Manual Therapy:  Grade II/III pulsed humeral head distraction from R hip with flexed hip and flexed knee x 10   Grade III long axis distraction of R LE - 3 x 30 s hold   STM along the midline of hamstring muscle belly with massage cream  Palpation to bakers cyst on posterior R LE, no pain associated  Trigger point STM along medial hamstring proximal to distal   Lacrosse ball massage to hamstring in long sitting  Prolonged hamstring stretch in a lunge position, overpressure at end range - hold for 20 s bilaterally x 4  Discussed HEP, and educated on the importance of stretching his hamstrings and maintaining mobility in his hips and pelvis daily.    PATIENT EDUCATION:  Education details: Nerve glides/ Ex. Program (Access Code: [email or web address]) Person educated: Patient Education method: Explanation Education comprehension: verbalized understanding   HOME EXERCISE PROGRAM: Access Code: [email or web address] URL:  [email or web address] Date: 11/22/2021 Prepared by: Dorcas Carrow   Exercises - Hooklying Transversus Abdominis Palpation  - 1 x daily - 7 x  weekly - 1 sets - 10 reps - Supine March  - 1 x daily - 7 x weekly - 2 sets - 10 reps - Supine Bridge  - 1 x daily - 7 x weekly - 2 sets - 10 reps - Bridge with Hip Abduction and Resistance - Ground Touches  - 1 x daily - 7 x weekly - 2 sets - 10 reps - Supine Sciatic Nerve Glide  - 1 x daily - 7 x weekly - 3 sets - 4 reps - Thoracic Extension Mobilization on Foam Roll  - 1 x daily - 7 x weekly - 3 sets - 10 reps - Thoracic Extension Mobilization on Foam Roll  - 1 x daily - 7 x weekly - 1 sets - 3 reps - Seated Thoracic Lumbar Extension  - 1 x daily - 7 x weekly - 1 sets - 3 reps  ASSESSMENT:  CLINICAL IMPRESSION: 12/27/21  Pt is pleasant throughout session; has good body awareness and communicates well with therapist. SPT performed interventions; PT documents. Sciatic pain reproduced with Hypervolt. Manual therapy was followed by stretches targeting gracilis and medial hamstring. Difficult to accomplish stretch due to excessive general flexibility and/or limitations related to bilateral knee pain. Pt reports continued sciatic pain; he will benefit from skilled PT involving joint mobilizations of spine and pelvis, core stability, sciatic nerve glides, and hamstring STM to decrease overall pain and improve QOL. Will plan to continue treating patient for another 8 weeks, 1x/week.       OBJECTIVE IMPAIRMENTS decreased endurance, decreased mobility, decreased strength, hypomobility, and pain.   ACTIVITY LIMITATIONS cleaning and occupation.   PERSONAL FACTORS Age, Fitness, and 1 comorbidity:    are also affecting patient's functional outcome.    REHAB POTENTIAL: Good  CLINICAL DECISION MAKING: Stable/uncomplicated  EVALUATION COMPLEXITY: Low   GOALS: Goals reviewed with patient? Yes  SHORT TERM GOALS: Target date: 12/13/21  Pt.  Will be independent with core stability/ TrA ex. Program to improve pain-free mobility at home.  Baseline: Pt. Participates with yoga. Goal status: Goal met   LONG TERM GOALS: Target date: 01/10/22  Pt. Will increase FOTO to 73 to improve pain-free mobility.  Baseline: initail 61; 12/27/21: 67 Goal status: PROGRESSING  2.  Pt. Will report no R posterior hamstring/LE symptoms consistently for 1 week to improve pain-free mobility.   Baseline: R posterior hamstring discomfort/ catching; 12/27/21: reports pain with combined hip flexion/abduction while getting out of car and bed  Goal status: PROGRESSING  3.  Pt. Will report no low back/LE symptoms with sleeping at night. Baseline: increase discomfort with rolling in bed; 12/27/21: able to turn/roll throughout throughout the night without waking  Goal status: MET   PLAN: PT FREQUENCY: 1x/week  PT DURATION: 8 weeks  PLANNED INTERVENTIONS: Therapeutic exercises, Therapeutic activity, Neuromuscular re-education, Balance training, Patient/Family education, Joint mobilization, Dry Needling, Electrical stimulation, Cryotherapy, Moist heat, and Manual therapy.  PLAN FOR NEXT SESSION: Treadmill warm-up with assessment of gait pattern; Manual therapy to hamstrings, nerve glides to LE's, joint distraction to R hip, and lumbar stabilization via core strengthening.      Patrina Levering PT, DPT Andee Lineman, SPT

## 2022-01-01 ENCOUNTER — Ambulatory Visit: Payer: BC Managed Care – PPO | Admitting: Physical Therapy

## 2022-01-01 DIAGNOSIS — G8929 Other chronic pain: Secondary | ICD-10-CM

## 2022-01-01 DIAGNOSIS — M5441 Lumbago with sciatica, right side: Secondary | ICD-10-CM | POA: Diagnosis not present

## 2022-01-01 DIAGNOSIS — M6281 Muscle weakness (generalized): Secondary | ICD-10-CM | POA: Diagnosis not present

## 2022-01-01 NOTE — Therapy (Addendum)
OUTPATIENT PHYSICAL THERAPY THORACOLUMBAR TREATMENT   Patient Name: Daniel Lawrence MRN: [id number] DOB:06/02/1961, 61 y.o., male Today's Date: 01/01/2022   PT End of Session - 01/01/22 1531     Visit Number 11    Number of Visits 18    Date for PT Re-Evaluation 02/21/22    PT Start Time 1751    PT Stop Time 1529    PT Time Calculation (min) 57 min    Activity Tolerance Patient tolerated treatment well    Behavior During Therapy WFL for tasks assessed/performed               Past Medical History:  Diagnosis Date   Allergy    Arthritis    left knee   Cancer ([place])    skin cancer   Cataract    Hyperlipidemia    Hypertension    Past Surgical History:  Procedure Laterality Date   BRAIN SURGERY     ELEVATION OF DEPRESSED SKULL FRACTURE  1971   traumatic blow to head by golf club   , [facility])   MOHS SURGERY  March 2013   left temple, squamous   ROBOT ASSISTED LAPAROSCOPIC NEPHRECTOMY Right 04/20/2016   Procedure: XI ROBOTIC ASSISTED LAPAROSCOPIC RADICAL NEPHRECTOMY;  Surgeon: Alexis Frock, MD;  Location: [facility];  Service: Urology;  Laterality: Right;   Patient Active Problem List   Diagnosis Date Noted   Low back pain 09/15/2021   Cancer ([place]) 06/29/2021   Mass of soft tissue of shoulder 06/29/2021   Mass of soft tissue of neck 06/29/2021   Aortic atherosclerosis ([place]) 04/18/2021   Melanoma in situ of right upper arm ([place]) 04/18/2021   H/O right nephrectomy 04/17/2020   Loss of libido 04/14/2019   Degenerative arthritis of left knee 02/25/2018   Chronic pain of left knee 02/08/2018   CKD (chronic kidney disease) stage 3, GFR 30-59 ml/min (HCC) 02/08/2018   Coronary atherosclerosis due to lipid rich plaque 05/26/2016   History of renal cell carcinoma 04/20/2016   B12 deficiency 04/08/2016   Encounter for preventive health examination 06/24/2014   Hypertension 06/23/2014   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    THERAPY DIAG:  Muscle weakness (generalized)  Chronic bilateral low back pain with right-sided sciatica  ONSET DATE: 07/16/21  SUBJECTIVE:  SUBJECTIVE STATEMENT: Pt presented to the clinic with minimal pain and discomfort, but had soreness after golfing this past weekend. Soreness primarily with combined hip flexion and abduction noted with getting out of car and bed. Wife is helping pt with deep pressure massage to hamstring at home. Pt states he has been riding his bike through his neighborhood, and is able to bike in standing position without increased discomfort.   PERTINENT HISTORY:  11/02/21 MD visit CC: Low back pain, left knee pain   HPI: Subjective    09/14/2021 Patient does give a episode of potential radiculopathy.  Discussed with patient about icing regimen and home exercises, discussed which activities to do and which ones to avoid.  Once to start with home exercises which was given to him by athletic trainer.  Follow-up again in 6 weeks   Arthritic changes of the knees bilaterally.  Discussed the possibility of injections.  Discussed icing regimen and home exercises.  Patient feels like he is doing relatively well at the moment. Discussed HEP we will get bilateral standing x-rays.  Follow-up again in 4 to 6 weeks.  Worsening pain consider formal physical therapy or injections.   Updated 11/02/2021 Daniel Lawrence is a 61 y.o. male coming in with complaint of LBP and left knee pain. Patient states that he continues to have sciatic nerve pain in R leg  that is occurring more frequently. Would like to go to PT to be held accountable. Does not want to focus on knee pain at this time as his R hip and leg pain is more prevalent.        TODAY'S TREATMENT   01/01/22: Treadmill walking for 8  minutes - 2 mins at 2.5 mph (0 deg incline) , 2 mins at 2.7 mph (3 deg incline) , 2 mins at 2.9 mph (5 deg incline), 2 mins at 3.1 mph (7 deg incline)  Manual Therapy: Supine: Grade 3/4 oscillating R hip distraction manually, with knee in flexed position over SPT's shoulder - 5 minutes Grade 4 long axis distraction on R LE: 2 X 10 Sciatic nerve glides with overpressure at end range PF PROM of hip in Flexion/ER - 5 mins  Prone: Grade 2/3 mobs to thoracic, lumbar, and sacral regions - 5 s mob per vertebrae     Hypervolt to R hamstring level 2 - 5 mins     STM to R hamstring, deep pressure to middle of hamstring with massage cream - 5 mins     R Hip flexor stretch with prolonged hold in prone position - 15 s x 5     12/25/21:  SciFit L6 10 min B UE/LE:  Manual Therapy Supine: Grade 4 oscillating R hip distraction with distraction belt - 10 minutes Grade 4 oscillating R hip distraction manually, with knee in flexed position over SPT's shoulder - 10 minutes Grade 4 long axis distraction on R LE: 4 X 10  Prone: Grade 3 thoracic, lumbar, and sacral CPA - 15 s per vertebrae (moderate hypomobility) Hypervolt to right hamstring and bilateral T and L paraspinals - 5 mins each    Therapeutic Exercises Lateral lunge - light stretch felt  Sumo squat (full depth) with elbows pushing into greater hip abduction - mild stretch felt Lizard lunge - good stretch felt  -lizard lunge with quad stretch     PATIENT EDUCATION:  Education details: Nerve glides/ Ex. Program (Access Code: [email or web address]) Person educated: Patient Education method: Explanation Education comprehension: verbalized understanding   HOME EXERCISE  PROGRAM: Access Code: [email or web address] URL:  [email or web address] Date: 11/22/2021 Prepared by: Dorcas Carrow   Exercises - Hooklying Transversus Abdominis Palpation  - 1 x daily - 7 x weekly - 1 sets - 10 reps - Supine March  - 1 x daily - 7 x weekly - 2 sets - 10 reps - Supine Bridge  - 1 x daily - 7 x weekly - 2 sets - 10 reps - Bridge with Hip Abduction and Resistance - Ground Touches  - 1 x daily - 7 x weekly - 2 sets - 10 reps - Supine Sciatic Nerve Glide  - 1 x daily - 7 x weekly - 3 sets - 4 reps - Thoracic Extension Mobilization on Foam Roll  - 1 x daily - 7 x weekly - 3 sets - 10 reps - Thoracic Extension Mobilization on Foam Roll  - 1 x daily - 7 x weekly - 1 sets - 3 reps - Seated Thoracic Lumbar Extension  - 1 x daily - 7 x weekly - 1 sets - 3 reps  ASSESSMENT:  CLINICAL IMPRESSION: 01/01/22  Sciatic pain reproduced with manual therapy more than the hypervolt in today's session. Manual therapy was followed by stretches targeting medial hamstring, and passively bringing his LE into flexion/ER without reproduction of symptomology. Pt reports continued sciatic pain; he will benefit from skilled PT involving joint mobilizations of spine and pelvis, core stability, sciatic nerve glides, and hamstring STM to decrease overall pain and improve QOL. Pt education was provided on the importance of maintaining an active lifestyle to decrease joint stiffness associated with ongoing arthritis. Will plan to continue treating patient for another 8 weeks, 1x/week.    OBJECTIVE IMPAIRMENTS decreased endurance, decreased mobility, decreased strength, hypomobility, and pain.   ACTIVITY LIMITATIONS cleaning and occupation.   PERSONAL FACTORS Age, Fitness, and 1 comorbidity:    are also affecting patient's functional outcome.    REHAB POTENTIAL: Good  CLINICAL DECISION MAKING: Stable/uncomplicated  EVALUATION COMPLEXITY: Low   GOALS: Goals reviewed with patient? Yes  SHORT TERM GOALS: Target date: 12/13/21  Pt. Will  be independent with core stability/ TrA ex. Program to improve pain-free mobility at home.  Baseline: Pt. Participates with yoga. Goal status: Goal met   LONG TERM GOALS: Target date: 02/21/22  Pt. Will increase FOTO to 73 to improve pain-free mobility.  Baseline: initail 61; 12/27/21: 67 Goal status: PROGRESSING  2.  Pt. Will report no R posterior hamstring/LE symptoms consistently for 1 week to improve pain-free mobility.   Baseline: R posterior hamstring discomfort/ catching; 12/27/21: reports pain with combined hip flexion/abduction while getting out of car and bed  Goal status: PROGRESSING  3.  Pt. Will report no low back/LE symptoms with sleeping at night. Baseline: increase discomfort with rolling in bed; 12/27/21: able to turn/roll throughout throughout the night without waking  Goal status: MET   PLAN: PT FREQUENCY: 1x/week  PT DURATION: 8 weeks  PLANNED INTERVENTIONS: Therapeutic exercises, Therapeutic activity, Neuromuscular re-education, Balance training, Patient/Family education, Joint mobilization, Dry Needling, Electrical stimulation, Cryotherapy, Moist heat, and Manual therapy.  PLAN FOR NEXT SESSION: Treadmill warm-up with assessment of gait pattern; Manual therapy to hamstrings, nerve glides to LE's, joint distraction to R hip, and lumbar stabilization via core strengthening.    Pura Spice, PT, DPT # [phone] Andee Lineman, SPT

## 2022-01-02 DIAGNOSIS — Z6830 Body mass index (BMI) 30.0-30.9, adult: Secondary | ICD-10-CM | POA: Diagnosis not present

## 2022-01-02 DIAGNOSIS — E119 Type 2 diabetes mellitus without complications: Secondary | ICD-10-CM | POA: Diagnosis not present

## 2022-01-02 DIAGNOSIS — E669 Obesity, unspecified: Secondary | ICD-10-CM | POA: Diagnosis not present

## 2022-01-02 DIAGNOSIS — I10 Essential (primary) hypertension: Secondary | ICD-10-CM | POA: Diagnosis not present

## 2022-01-02 DIAGNOSIS — G4730 Sleep apnea, unspecified: Secondary | ICD-10-CM | POA: Diagnosis not present

## 2022-01-03 ENCOUNTER — Encounter: Payer: Self-pay | Admitting: Physical Therapy

## 2022-01-03 ENCOUNTER — Ambulatory Visit: Payer: BC Managed Care – PPO | Admitting: Physical Therapy

## 2022-01-03 DIAGNOSIS — G8929 Other chronic pain: Secondary | ICD-10-CM | POA: Diagnosis not present

## 2022-01-03 DIAGNOSIS — M6281 Muscle weakness (generalized): Secondary | ICD-10-CM | POA: Diagnosis not present

## 2022-01-03 DIAGNOSIS — M5441 Lumbago with sciatica, right side: Secondary | ICD-10-CM | POA: Diagnosis not present

## 2022-01-03 NOTE — Therapy (Addendum)
OUTPATIENT PHYSICAL THERAPY THORACOLUMBAR TREATMENT   Patient Name: Daniel Lawrence MRN: [id number] DOB:Nov 04, 1960, 61 y.o., male Today's Date: 01/03/2022   PT End of Session - 01/03/22 0901     Visit Number 12    Number of Visits 18    Date for PT Re-Evaluation 02/21/22    PT Start Time 0901    PT Stop Time 0943    PT Time Calculation (min) 42 min    Activity Tolerance Patient tolerated treatment well    Behavior During Therapy [facility] for tasks assessed/performed               Past Medical History:  Diagnosis Date   Allergy    Arthritis    left knee   Cancer ([place])    skin cancer   Cataract    Hyperlipidemia    Hypertension    Past Surgical History:  Procedure Laterality Date   BRAIN SURGERY     ELEVATION OF DEPRESSED SKULL FRACTURE  1971   traumatic blow to head by golf club   , [facility])   MOHS SURGERY  March 2013   left temple, squamous   ROBOT ASSISTED LAPAROSCOPIC NEPHRECTOMY Right 04/20/2016   Procedure: XI ROBOTIC ASSISTED LAPAROSCOPIC RADICAL NEPHRECTOMY;  Surgeon: Alexis Frock, MD;  Location: [facility];  Service: Urology;  Laterality: Right;   Patient Active Problem List   Diagnosis Date Noted   Low back pain 09/15/2021   Cancer ([place]) 06/29/2021   Mass of soft tissue of shoulder 06/29/2021   Mass of soft tissue of neck 06/29/2021   Aortic atherosclerosis ([place]) 04/18/2021   Melanoma in situ of right upper arm ([place]) 04/18/2021   H/O right nephrectomy 04/17/2020   Loss of libido 04/14/2019   Degenerative arthritis of left knee 02/25/2018   Chronic pain of left knee 02/08/2018   CKD (chronic kidney disease) stage 3, GFR 30-59 ml/min (HCC) 02/08/2018   Coronary atherosclerosis due to lipid rich plaque 05/26/2016   History of renal cell carcinoma 04/20/2016   B12 deficiency 04/08/2016   Encounter for preventive health examination 06/24/2014   Hypertension 06/23/2014   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    THERAPY DIAG:  Muscle weakness (generalized)  Chronic bilateral low back pain with right-sided sciatica  ONSET DATE: 07/16/21  SUBJECTIVE:  SUBJECTIVE STATEMENT: Pt presented to the clinic with minimal pain and discomfort, but had soreness after golfing this past weekend. Soreness primarily with combined hip flexion and abduction noted with getting out of car and bed. Wife is helping pt with deep pressure massage to hamstring at home. Pt states he has been riding his bike through his neighborhood, and is able to bike in standing position without increased discomfort.  PERTINENT HISTORY:  11/02/21 MD visit CC: Low back pain, left knee pain   HPI: Subjective    09/14/2021 Patient does give a episode of potential radiculopathy.  Discussed with patient about icing regimen and home exercises, discussed which activities to do and which ones to avoid.  Once to start with home exercises which was given to him by athletic trainer.  Follow-up again in 6 weeks   Arthritic changes of the knees bilaterally.  Discussed the possibility of injections.  Discussed icing regimen and home exercises.  Patient feels like he is doing relatively well at the moment. Discussed HEP we will get bilateral standing x-rays.  Follow-up again in 4 to 6 weeks.  Worsening pain consider formal physical therapy or injections.   Updated 11/02/2021 Daniel Lawrence is a 61 y.o. male coming in with complaint of LBP and left knee pain. Patient states that he continues to have sciatic nerve pain in R leg that  is occurring more frequently. Would like to go to PT to be held accountable. Does not want to focus on knee pain at this time as his R hip and leg pain is more prevalent.        TODAY'S TREATMENT   01/03/22: Treadmill walking at 0 deg incline for 8 minutes - 2.7 mph; treadmill running at 4.2 mph for 2 mins at 0 deg incline  There ex: Seated blue ball hip flexion/ER x 40 B Seated blue ball LAQ 20 x B - 2 sets Seated blue ball marching - 20 x   Manual Therapy: Long axis R hip distraction 30 s x 4 Prone hypervolt to R hamstring - level 2 - 5 mins  Prone STM to R hamstring and trigger point release to R hamstring - 10 mins      R Hip flexor stretch with prolonged hold in prone position - 15 s x 4     R hamstring stretch in supine position - 15 s x 4  01/01/22: Treadmill walking for 8  minutes - 2 mins at 2.5 mph (0 deg incline) , 2 mins at 2.7 mph (3 deg incline) , 2 mins at 2.9 mph (5 deg incline), 2 mins at 3.1 mph (7 deg incline)  Manual Therapy: Supine: Grade 3/4 oscillating R hip distraction manually, with knee in flexed position over SPT's shoulder - 5 minutes Grade 4 long axis distraction on R LE: 2 X 10 Sciatic nerve glides with overpressure at end range PF PROM of hip in Flexion/ER - 5 mins  Prone: Grade 2/3 mobs to thoracic, lumbar, and sacral regions - 5 s mob per vertebrae     Hypervolt to R hamstring level 2 - 5 mins     STM to R hamstring, deep pressure to middle of hamstring with massage cream - 5 mins     R Hip flexor stretch with prolonged hold in prone position - 15 s x 5     PATIENT EDUCATION:  Education details: Nerve glides/ Ex. Program (Access Code: [email or web address]) Person educated: Patient Education method: Explanation Education comprehension: verbalized understanding  HOME EXERCISE PROGRAM: Access Code: [email or web address] URL: [email or web address] Date: 11/22/2021 Prepared by: Dorcas Carrow   Exercises - Hooklying Transversus Abdominis Palpation   - 1 x daily - 7 x weekly - 1 sets - 10 reps - Supine March  - 1 x daily - 7 x weekly - 2 sets - 10 reps - Supine Bridge  - 1 x daily - 7 x weekly - 2 sets - 10 reps - Bridge with Hip Abduction and Resistance - Ground Touches  - 1 x daily - 7 x weekly - 2 sets - 10 reps - Supine Sciatic Nerve Glide  - 1 x daily - 7 x weekly - 3 sets - 4 reps - Thoracic Extension Mobilization on Foam Roll  - 1 x daily - 7 x weekly - 3 sets - 10 reps - Thoracic Extension Mobilization on Foam Roll  - 1 x daily - 7 x weekly - 1 sets - 3 reps - Seated Thoracic Lumbar Extension  - 1 x daily - 7 x weekly - 1 sets - 3 reps  ASSESSMENT:  CLINICAL IMPRESSION: 01/01/22  Sciatic pain minimally reproduced with manual therapy and/or hypervolt in today's session. Manual therapy was followed by stretches targeting medial hamstring, and passively bringing his LE into flexion/ER without reproduction of symptomology. Pt was fatigued by interventions on bosu ball, and will benefit from engaging his core throughout future treatments. Pt education was provided on the importance of maintaining an active lifestyle to decrease joint stiffness associated with ongoing arthritis. Will plan to continue treating patient for another 8 weeks, 1x/week.    OBJECTIVE IMPAIRMENTS decreased endurance, decreased mobility, decreased strength, hypomobility, and pain.   ACTIVITY LIMITATIONS cleaning and occupation.   PERSONAL FACTORS Age, Fitness, and 1 comorbidity:    are also affecting patient's functional outcome.    REHAB POTENTIAL: Good  CLINICAL DECISION MAKING: Stable/uncomplicated  EVALUATION COMPLEXITY: Low   GOALS: Goals reviewed with patient? Yes  SHORT TERM GOALS: Target date: 12/13/21  Pt. Will be independent with core stability/ TrA ex. Program to improve pain-free mobility at home.  Baseline: Pt. Participates with yoga. Goal status: Goal met   LONG TERM GOALS: Target date: 02/21/22  Pt. Will increase FOTO to 73 to improve  pain-free mobility.  Baseline: initail 61; 12/27/21: 67 Goal status: PROGRESSING  2.  Pt. Will report no R posterior hamstring/LE symptoms consistently for 1 week to improve pain-free mobility.   Baseline: R posterior hamstring discomfort/ catching; 12/27/21: reports pain with combined hip flexion/abduction while getting out of car and bed  Goal status: PROGRESSING  3.  Pt. Will report no low back/LE symptoms with sleeping at night. Baseline: increase discomfort with rolling in bed; 12/27/21: able to turn/roll throughout throughout the night without waking  Goal status: MET   PLAN: PT FREQUENCY: 1x/week  PT DURATION: 8 weeks  PLANNED INTERVENTIONS: Therapeutic exercises, Therapeutic activity, Neuromuscular re-education, Balance training, Patient/Family education, Joint mobilization, Dry Needling, Electrical stimulation, Cryotherapy, Moist heat, and Manual therapy.  PLAN FOR NEXT SESSION: Treadmill warm-up; Manual therapy to hamstrings, nerve glides to LE's, joint distraction to R hip, and lumbar stabilization via core strengthening.    Pura Spice, PT, DPT # [id number] Andee Lineman, SPT  01/03/2022   15:13PM

## 2022-01-04 DIAGNOSIS — I10 Essential (primary) hypertension: Secondary | ICD-10-CM | POA: Diagnosis not present

## 2022-01-04 DIAGNOSIS — E663 Overweight: Secondary | ICD-10-CM | POA: Diagnosis not present

## 2022-01-05 DIAGNOSIS — F419 Anxiety disorder, unspecified: Secondary | ICD-10-CM | POA: Diagnosis not present

## 2022-01-09 ENCOUNTER — Encounter: Payer: Self-pay | Admitting: Family Medicine

## 2022-01-09 ENCOUNTER — Other Ambulatory Visit: Payer: Self-pay

## 2022-01-09 MED ORDER — GABAPENTIN 300 MG PO CAPS: 300.0000 mg | ORAL_CAPSULE | Freq: Every day | ORAL | 1 refills | Status: DC

## 2022-01-10 ENCOUNTER — Encounter: Payer: Self-pay | Admitting: Physical Therapy

## 2022-01-10 ENCOUNTER — Ambulatory Visit: Payer: BC Managed Care – PPO | Admitting: Physical Therapy

## 2022-01-10 DIAGNOSIS — G8929 Other chronic pain: Secondary | ICD-10-CM | POA: Diagnosis not present

## 2022-01-10 DIAGNOSIS — M6281 Muscle weakness (generalized): Secondary | ICD-10-CM

## 2022-01-10 DIAGNOSIS — M5441 Lumbago with sciatica, right side: Secondary | ICD-10-CM | POA: Diagnosis not present

## 2022-01-10 NOTE — Therapy (Signed)
OUTPATIENT PHYSICAL THERAPY THORACOLUMBAR TREATMENT   Patient Name: Daniel Lawrence MRN: [id number] DOB:Dec 13, 1960, 61 y.o., male Today's Date: 01/10/2022   PT End of Session - 01/10/22 0955     Visit Number 13    Number of Visits 18    Date for PT Re-Evaluation 02/21/22    PT Start Time 0946    PT Stop Time 1033    PT Time Calculation (min) 47 min    Activity Tolerance Patient tolerated treatment well    Behavior During Therapy [facility] for tasks assessed/performed               Past Medical History:  Diagnosis Date   Allergy    Arthritis    left knee   Cancer ([place])    skin cancer   Cataract    Hyperlipidemia    Hypertension    Past Surgical History:  Procedure Laterality Date   BRAIN SURGERY     ELEVATION OF DEPRESSED SKULL FRACTURE  1971   traumatic blow to head by golf club   , [facility])   MOHS SURGERY  March 2013   left temple, squamous   ROBOT ASSISTED LAPAROSCOPIC NEPHRECTOMY Right 04/20/2016   Procedure: XI ROBOTIC ASSISTED LAPAROSCOPIC RADICAL NEPHRECTOMY;  Surgeon: Alexis Frock, MD;  Location: [facility];  Service: Urology;  Laterality: Right;   Patient Active Problem List   Diagnosis Date Noted   Low back pain 09/15/2021   Cancer ([place]) 06/29/2021   Mass of soft tissue of shoulder 06/29/2021   Mass of soft tissue of neck 06/29/2021   Aortic atherosclerosis ([place]) 04/18/2021   Melanoma in situ of right upper arm ([place]) 04/18/2021   H/O right nephrectomy 04/17/2020   Loss of libido 04/14/2019   Degenerative arthritis of left knee 02/25/2018   Chronic pain of left knee 02/08/2018   CKD (chronic kidney disease) stage 3, GFR 30-59 ml/min (HCC) 02/08/2018   Coronary atherosclerosis due to lipid rich plaque 05/26/2016   History of renal cell carcinoma 04/20/2016   B12 deficiency 04/08/2016   Encounter for preventive health examination 06/24/2014   Hypertension 06/23/2014   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    THERAPY DIAG:  Muscle weakness (generalized)  Chronic bilateral low back pain with right-sided sciatica  ONSET DATE: 07/16/21  SUBJECTIVE:  SUBJECTIVE STATEMENT: Pt. Saw a nutritionist at [facility] and has started using [employer] app For nutrition.  Pt. States he lost 4# in past week and is working on losing 20#.  Pt. Reports legs feel better overall and he is not have the "grabbing pain".  Pt. Using Hypervolt at home.    PERTINENT HISTORY:  11/02/21 MD visit CC: Low back pain, left knee pain   HPI: Subjective    09/14/2021 Patient does give a episode of potential radiculopathy.  Discussed with patient about icing regimen and home exercises, discussed which activities to do and which ones to avoid.  Once to start with home exercises which was given to him by athletic trainer.  Follow-up again in 6 weeks   Arthritic changes of the knees bilaterally.  Discussed the possibility of injections.  Discussed icing regimen and home exercises.  Patient feels like he is doing relatively well at the moment. Discussed HEP we will get bilateral standing x-rays.  Follow-up again in 4 to 6 weeks.  Worsening pain consider formal physical therapy or injections.   Updated 11/02/2021 Daniel Lawrence is a 62 y.o. male coming in with complaint of LBP and left knee pain. Patient states that he continues to have sciatic nerve pain in R leg that is occurring more frequently. Would like to go to PT to be held accountable. Does not want to focus on knee pain at this time as his R hip and leg pain  is more prevalent.        TODAY'S TREATMENT   01/10/22:  Scifit L6 5 min. Forward and backwards with B UE/LE.  Warm-up/ discussed daily activity.    Walking in hallway/ gym with consistent reciprocal gait pattern with no c/o pain.    12" step ups with L/R.  5x each but limited by L medial knee pain after 3x.   Discussed HEP    Manual Therapy:  Supine L/R sciatic nerve glides 2x 30 seconds each.     Supine long axis R and L hip distraction 2x 30 seconds each.  Prone hypervolt to R hamstring/ glut./ piriformis/ lumbar paraspinals - level 2 - 7 mins   Prone STM to R hamstring and trigger point release to R hamstring - 6 mins         R Hip flexor/ IR/ ER stretch with prolonged hold in prone position - 15 s x 3 each.       01/03/22: Treadmill walking at 0 deg incline for 8 minutes - 2.7 mph; treadmill running at 4.2 mph for 2 mins at 0 deg incline  There ex: Seated blue ball hip flexion/ER x 40 B Seated blue ball LAQ 20 x B - 2 sets Seated blue ball marching - 20 x   Manual Therapy: Long axis R hip distraction 30 s x 4 Prone hypervolt to R hamstring - level 2 - 5 mins  Prone STM to R hamstring and trigger point release to R hamstring - 10 mins      R Hip flexor stretch with prolonged hold in prone position - 15 s x 4     R hamstring stretch in supine position - 15 s x 4    01/01/22: Treadmill walking for 8  minutes - 2 mins at 2.5 mph (0 deg incline) , 2 mins at 2.7 mph (3 deg incline) , 2 mins at 2.9 mph (5 deg incline), 2 mins at 3.1 mph (7 deg incline)  Manual Therapy: Supine: Grade 3/4 oscillating R  hip distraction manually, with knee in flexed position over SPT's shoulder - 5 minutes Grade 4 long axis distraction on R LE: 2 X 10 Sciatic nerve glides with overpressure at end range PF PROM of hip in Flexion/ER - 5 mins  Prone: Grade 2/3 mobs to thoracic, lumbar, and sacral regions - 5 s mob per vertebrae     Hypervolt to R hamstring level 2 - 5 mins     STM to  R hamstring, deep pressure to middle of hamstring with massage cream - 5 mins     R Hip flexor stretch with prolonged hold in prone position - 15 s x 5     PATIENT EDUCATION:  Education details: Nerve glides/ Ex. Program (Access Code: [email or web address]) Person educated: Patient Education method: Explanation Education comprehension: verbalized understanding   HOME EXERCISE PROGRAM: Access Code: [email or web address] URL: [email or web address] Date: 11/22/2021 Prepared by: Dorcas Carrow   Exercises - Hooklying Transversus Abdominis Palpation  - 1 x daily - 7 x weekly - 1 sets - 10 reps - Supine March  - 1 x daily - 7 x weekly - 2 sets - 10 reps - Supine Bridge  - 1 x daily - 7 x weekly - 2 sets - 10 reps - Bridge with Hip Abduction and Resistance - Ground Touches  - 1 x daily - 7 x weekly - 2 sets - 10 reps - Supine Sciatic Nerve Glide  - 1 x daily - 7 x weekly - 3 sets - 4 reps - Thoracic Extension Mobilization on Foam Roll  - 1 x daily - 7 x weekly - 3 sets - 10 reps - Thoracic Extension Mobilization on Foam Roll  - 1 x daily - 7 x weekly - 1 sets - 3 reps - Seated Thoracic Lumbar Extension  - 1 x daily - 7 x weekly - 1 sets - 3 reps  ASSESSMENT:  CLINICAL IMPRESSION: 01/01/22  No c/o back/hip pain during tx. Session/ bed mobility on mat table today.  Pt. Limited with L LE step ups secondary to medial knee pain.  Tx. Focused primarily on manual therapy to R hamstring/ hip with no reproduction of pain.  Pt education was provided on the importance of maintaining an active lifestyle to decrease joint stiffness associated with ongoing arthritis. Pt. Will continue to benefit from skilled PT services to progress pt. Towards all goals/ pain-free mobility.     OBJECTIVE IMPAIRMENTS decreased endurance, decreased mobility, decreased strength, hypomobility, and pain.   ACTIVITY LIMITATIONS cleaning and occupation.   PERSONAL FACTORS Age, Fitness, and 1 comorbidity:    are also affecting patient's  functional outcome.    REHAB POTENTIAL: Good  CLINICAL DECISION MAKING: Stable/uncomplicated  EVALUATION COMPLEXITY: Low   GOALS: Goals reviewed with patient? Yes  SHORT TERM GOALS: Target date: 12/13/21  Pt. Will be independent with core stability/ TrA ex. Program to improve pain-free mobility at home.  Baseline: Pt. Participates with yoga. Goal status: Goal met   LONG TERM GOALS: Target date: 02/21/22  Pt. Will increase FOTO to 73 to improve pain-free mobility.  Baseline: initail 61; 12/27/21: 67 Goal status: PROGRESSING  2.  Pt. Will report no R posterior hamstring/LE symptoms consistently for 1 week to improve pain-free mobility.   Baseline: R posterior hamstring discomfort/ catching; 12/27/21: reports pain with combined hip flexion/abduction while getting out of car and bed  Goal status: PROGRESSING  3.  Pt. Will report no low back/LE symptoms with sleeping at night. Baseline: increase discomfort with rolling in  bed; 12/27/21: able to turn/roll throughout throughout the night without waking  Goal status: MET   PLAN: PT FREQUENCY: 1x/week  PT DURATION: 8 weeks  PLANNED INTERVENTIONS: Therapeutic exercises, Therapeutic activity, Neuromuscular re-education, Balance training, Patient/Family education, Joint mobilization, Dry Needling, Electrical stimulation, Cryotherapy, Moist heat, and Manual therapy.  PLAN FOR NEXT SESSION: Treadmill warm-up; Manual therapy to hamstrings, nerve glides to LE's, joint distraction to R hip, and lumbar stabilization via core strengthening.    Pura Spice, PT, DPT # [id number]  01/10/2022   13:13PM

## 2022-01-17 ENCOUNTER — Encounter: Payer: Self-pay | Admitting: Physical Therapy

## 2022-01-17 ENCOUNTER — Ambulatory Visit: Payer: BC Managed Care – PPO | Attending: Family Medicine | Admitting: Physical Therapy

## 2022-01-17 DIAGNOSIS — M5441 Lumbago with sciatica, right side: Secondary | ICD-10-CM | POA: Insufficient documentation

## 2022-01-17 DIAGNOSIS — M6281 Muscle weakness (generalized): Secondary | ICD-10-CM | POA: Diagnosis not present

## 2022-01-17 DIAGNOSIS — G8929 Other chronic pain: Secondary | ICD-10-CM | POA: Insufficient documentation

## 2022-01-17 NOTE — Therapy (Addendum)
OUTPATIENT PHYSICAL THERAPY THORACOLUMBAR TREATMENT   Patient Name: Daniel Lawrence MRN: [id number] DOB:1960/12/02, 61 y.o., male Today's Date: 01/17/2022   PT End of Session - 01/17/22 1432     Visit Number 14    Number of Visits 18    Date for PT Re-Evaluation 02/21/22    PT Start Time 0901    PT Stop Time 0946    PT Time Calculation (min) 45 min    Activity Tolerance Patient tolerated treatment well    Behavior During Therapy [facility] for tasks assessed/performed               Past Medical History:  Diagnosis Date   Allergy    Arthritis    left knee   Cancer ([place])    skin cancer   Cataract    Hyperlipidemia    Hypertension    Past Surgical History:  Procedure Laterality Date   BRAIN SURGERY     ELEVATION OF DEPRESSED SKULL FRACTURE  1971   traumatic blow to head by golf club   , [facility])   MOHS SURGERY  March 2013   left temple, squamous   ROBOT ASSISTED LAPAROSCOPIC NEPHRECTOMY Right 04/20/2016   Procedure: XI ROBOTIC ASSISTED LAPAROSCOPIC RADICAL NEPHRECTOMY;  Surgeon: Alexis Frock, MD;  Location: [facility];  Service: Urology;  Laterality: Right;   Patient Active Problem List   Diagnosis Date Noted   Low back pain 09/15/2021   Cancer ([place]) 06/29/2021   Mass of soft tissue of shoulder 06/29/2021   Mass of soft tissue of neck 06/29/2021   Aortic atherosclerosis ([place]) 04/18/2021   Melanoma in situ of right upper arm ([place]) 04/18/2021   H/O right nephrectomy 04/17/2020   Loss of libido 04/14/2019   Degenerative arthritis of left knee 02/25/2018   Chronic pain of left knee 02/08/2018   CKD (chronic kidney disease) stage 3, GFR 30-59 ml/min (HCC) 02/08/2018   Coronary atherosclerosis due to lipid rich plaque 05/26/2016   History of renal cell carcinoma 04/20/2016   B12 deficiency 04/08/2016   Encounter for preventive health examination 06/24/2014   Hypertension 06/23/2014   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    THERAPY DIAG:  Muscle weakness (generalized)  Chronic bilateral low back pain with right-sided sciatica  ONSET DATE: 07/16/21  SUBJECTIVE:  SUBJECTIVE STATEMENT: Pt presented to clinic with 0/10 pain, and has not experienced frequent symptomology in his R SIJ/LE. Pt. using Hypervolt at home on his hamstrings, and has experienced marked relief from this intervention. Pt also found relief from sitting in the sauna last weekend, and has been more active on his feet. Pt hiked over the weekend, and did not reproduce any pain in his R posterior LE.  PERTINENT HISTORY:  11/02/21 MD visit CC: Low back pain, left knee pain   HPI: Subjective    09/14/2021 Patient does give a episode of potential radiculopathy.  Discussed with patient about icing regimen and home exercises, discussed which activities to do and which ones to avoid.  Once to start with home exercises which was given to him by athletic trainer.  Follow-up again in 6 weeks   Arthritic changes of the knees bilaterally.  Discussed the possibility of injections.  Discussed icing regimen and home exercises.  Patient feels like he is doing relatively well at the moment. Discussed HEP we will get bilateral standing x-rays.  Follow-up again in 4 to 6 weeks.  Worsening pain consider formal physical therapy or injections.   Updated 11/02/2021 Daniel Lawrence is a 61 y.o. male coming in with complaint of LBP and left knee pain. Patient states that he continues to have sciatic nerve pain in R leg that is occurring more frequently.  Would like to go to PT to be held accountable. Does not want to focus on knee pain at this time as his R hip and leg pain is more prevalent.        TODAY'S TREATMENT   01/17/22:  Scifit L 6.2 for 10 mins - Forward and backwards with B UE/LE.  Warm-up/ discussed daily activity.   Manual therapy:  STM to R hamstring in prone position - active release at distal hamstring insertion points, moving proximally into hamstring muscle belly with massage lotion - 15 mins with passive knee flexion and extension.  Hypervolt level 2 to hamstrings in prone position - 10 mins   There ex:  Standing lunges onto BOSU ball forward x 10 B / lateral x 10 B in // bars  Seated blue ball hip flexion/ER with 4# weight around ankles - 10 x B / LAQ with 4# weight on ankles x 10 B / Seated marching with 4# weight around ankles 10 x B   01/10/22:  Scifit L6 5 min. Forward and backwards with B UE/LE.  Warm-up/ discussed daily activity.    Walking in hallway/ gym with consistent reciprocal gait pattern with no c/o pain.    12" step ups with L/R.  5x each but limited by L medial knee pain after 3x.   Discussed HEP    Manual Therapy:  Supine L/R sciatic nerve glides 2x 30 seconds each.     Supine long axis R and L hip distraction 2x 30 seconds each.  Prone hypervolt to R hamstring/ glut./ piriformis/ lumbar paraspinals - level 2 - 7 mins   Prone STM to R hamstring and trigger point release to R hamstring - 6 mins         R Hip flexor/ IR/ ER stretch with prolonged hold in prone position - 15 s x 3 each.      PATIENT EDUCATION:  Education details: Nerve glides/ Ex. Program (Access Code: [email or web address]) Person educated: Patient Education method: Explanation Education comprehension: verbalized understanding   HOME EXERCISE PROGRAM: Access Code: [email or web address] URL: [email or web address] Date: 11/22/2021 Prepared by:  Dorcas Carrow   Exercises - Hooklying Transversus Abdominis Palpation  - 1 x  daily - 7 x weekly - 1 sets - 10 reps - Supine March  - 1 x daily - 7 x weekly - 2 sets - 10 reps - Supine Bridge  - 1 x daily - 7 x weekly - 2 sets - 10 reps - Bridge with Hip Abduction and Resistance - Ground Touches  - 1 x daily - 7 x weekly - 2 sets - 10 reps - Supine Sciatic Nerve Glide  - 1 x daily - 7 x weekly - 3 sets - 4 reps - Thoracic Extension Mobilization on Foam Roll  - 1 x daily - 7 x weekly - 3 sets - 10 reps - Thoracic Extension Mobilization on Foam Roll  - 1 x daily - 7 x weekly - 1 sets - 3 reps - Seated Thoracic Lumbar Extension  - 1 x daily - 7 x weekly - 1 sets - 3 reps  ASSESSMENT:  CLINICAL IMPRESSION:   No c/o back/hip pain during tx. Session, and was walking with a more reciprocal gait pattern throughout the clinic. Pt benefited from cueing to maintain an upright posture during side lunges, and experienced mild pain in L knee when lunging to the L. Tx. Focused primarily on manual therapy to R hamstring/ hip with no reproduction of pain. Pt education was provided on the importance of maintaining an active lifestyle to decrease joint stiffness associated with ongoing arthritis. Pt. Will continue to benefit from skilled PT services to progress pt. Towards all goals/ pain-free mobility.     OBJECTIVE IMPAIRMENTS decreased endurance, decreased mobility, decreased strength, hypomobility, and pain.   ACTIVITY LIMITATIONS cleaning and occupation.   PERSONAL FACTORS Age, Fitness, and 1 comorbidity:    are also affecting patient's functional outcome.    REHAB POTENTIAL: Good  CLINICAL DECISION MAKING: Stable/uncomplicated  EVALUATION COMPLEXITY: Low   GOALS: Goals reviewed with patient? Yes  SHORT TERM GOALS: Target date: 12/13/21  Pt. Will be independent with core stability/ TrA ex. Program to improve pain-free mobility at home.  Baseline: Pt. Participates with yoga. Goal status: Goal met   LONG TERM GOALS: Target date: 02/21/22  Pt. Will increase FOTO to 73 to  improve pain-free mobility.  Baseline: initail 61; 12/27/21: 67 Goal status: PROGRESSING  2.  Pt. Will report no R posterior hamstring/LE symptoms consistently for 1 week to improve pain-free mobility.   Baseline: R posterior hamstring discomfort/ catching; 12/27/21: reports pain with combined hip flexion/abduction while getting out of car and bed  Goal status: PROGRESSING  3.  Pt. Will report no low back/LE symptoms with sleeping at night. Baseline: increase discomfort with rolling in bed; 12/27/21: able to turn/roll throughout throughout the night without waking  Goal status: MET   PLAN: PT FREQUENCY: 1x/week  PT DURATION: 8 weeks  PLANNED INTERVENTIONS: Therapeutic exercises, Therapeutic activity, Neuromuscular re-education, Balance training, Patient/Family education, Joint mobilization, Dry Needling, Electrical stimulation, Cryotherapy, Moist heat, and Manual therapy.  PLAN FOR NEXT SESSION: Manual therapy to hamstrings, nerve glides to LE's, joint distraction to R hip, and lumbar stabilization via core strengthening.     Pura Spice, PT, DPT # [id number] Andee Lineman, SPT 01/17/2022   13:13PM

## 2022-01-24 ENCOUNTER — Ambulatory Visit: Payer: BC Managed Care – PPO

## 2022-01-24 DIAGNOSIS — M5441 Lumbago with sciatica, right side: Secondary | ICD-10-CM | POA: Diagnosis not present

## 2022-01-24 DIAGNOSIS — M6281 Muscle weakness (generalized): Secondary | ICD-10-CM | POA: Diagnosis not present

## 2022-01-24 DIAGNOSIS — G8929 Other chronic pain: Secondary | ICD-10-CM | POA: Diagnosis not present

## 2022-01-24 NOTE — Therapy (Addendum)
OUTPATIENT PHYSICAL THERAPY THORACOLUMBAR TREATMENT   Patient Name: Daniel Lawrence MRN: [id number] DOB:[birth year], 61 y.o., male Today's Date: 01/24/2022   PT End of Session - 01/24/22 0823     Visit Number 15    Number of Visits 18    Date for PT Re-Evaluation 02/21/22    PT Start Time 0816    PT Stop Time 0901    PT Time Calculation (min) 45 min    Activity Tolerance Patient tolerated treatment well    Behavior During Therapy [facility] for tasks assessed/performed                Past Medical History:  Diagnosis Date   Allergy    Arthritis    left knee   Cancer ([place])    skin cancer   Cataract    Hyperlipidemia    Hypertension    Past Surgical History:  Procedure Laterality Date   BRAIN SURGERY     ELEVATION OF DEPRESSED SKULL FRACTURE  1971   traumatic blow to head by golf club   , [facility])   MOHS SURGERY  March 2013   left temple, squamous   ROBOT ASSISTED LAPAROSCOPIC NEPHRECTOMY Right 04/20/2016   Procedure: XI ROBOTIC ASSISTED LAPAROSCOPIC RADICAL NEPHRECTOMY;  Surgeon: Alexis Frock, MD;  Location[facility];  Service: Urology;  Laterality: Right;   Patient Active Problem List   Diagnosis Date Noted   Low back pain 09/15/2021   Cancer ([place]) 06/29/2021   Mass of soft tissue of shoulder 06/29/2021   Mass of soft tissue of neck 06/29/2021   Aortic atherosclerosis ([place]) 04/18/2021   Melanoma in situ of right upper arm ([place]) 04/18/2021   H/O right nephrectomy 04/17/2020   Loss of libido 04/14/2019   Degenerative arthritis of left knee 02/25/2018   Chronic pain of left knee 02/08/2018   CKD (chronic kidney disease) stage 3, GFR 30-59 ml/min (HCC) 02/08/2018   Coronary atherosclerosis due to lipid rich plaque 05/26/2016   History of renal cell carcinoma 04/20/2016   B12 deficiency 04/08/2016   Encounter for preventive health examination 06/24/2014   Hypertension 06/23/2014   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    THERAPY DIAG:  Muscle weakness (generalized)  Chronic bilateral low back pain with right-sided sciatica  ONSET DATE: 07/16/21  SUBJECTIVE:  SUBJECTIVE STATEMENT: Pt presented to clinic with 4/10 pain, which radiates between his hamstrings and his quads bilaterally. Pt. using Hypervolt at home on his LE's, and has experienced marked relief from this intervention. Pt has benefited from prolonged walking, and his discomfort subsides quickly with activity. Symptoms increase with immobility, especially when sitting at the computer for prolonged periods of time. Pt also stated that he has been experiencing joint pain in B knees, and had asked SPT to test for meniscal tears.   PERTINENT HISTORY:  11/02/21 MD visit CC: Low back pain, left knee pain   HPI: Subjective    09/14/2021 Patient does give a episode of potential radiculopathy.  Discussed with patient about icing regimen and home exercises, discussed which activities to do and which ones to avoid.  Once to start with home exercises which was given to him by athletic trainer.  Follow-up again in 6 weeks   Arthritic changes of the knees bilaterally.  Discussed the possibility of injections.  Discussed icing regimen and home exercises.  Patient feels like he is doing relatively well at the moment. Discussed HEP we will get bilateral standing x-rays.  Follow-up again in 4 to 6 weeks.  Worsening pain consider formal physical therapy or injections.   Updated 11/02/2021 Daniel Lawrence is a 61 y.o. male coming in with  complaint of LBP and left knee pain. Patient states that he continues to have sciatic nerve pain in R leg that is occurring more frequently. Would like to go to PT to be held accountable. Does not want to focus on knee pain at this time as his R hip and leg pain is more prevalent.        TODAY'S TREATMENT   01/24/22:  NuStep: L 6 for 10 minutes  Manual Therapy:  Prone Hypervolt to B hamstrings - 4 minutes Level 2 Prone STM to distal hamstring insertion sites and IT band insertion site on R LE - 3 minutes Supine Hypervolt to B quadriceps - 2 minutes Level 2 Supine B figure four stretch - 2 minutes  There ex: Standing lunges 10 x per LE - cueing to keep chest upright  Standing squats 30 x in // bars with hand hold assist  Nautilus walking: 80 # backward x 5 / 80# laterally x 5 on both sides Stationary wall sit with 90 degree knee flexion - 45 seconds   01/17/22:  Scifit L 6.2 for 10 mins - Forward and backwards with B UE/LE.  Warm-up/ discussed daily activity.   Manual therapy:  STM to R hamstring in prone position - active release at distal hamstring insertion points, moving proximally into hamstring muscle belly with massage lotion - 15 mins with passive knee flexion and extension.  Hypervolt level 2 to hamstrings in prone position - 10 mins   There ex:  Standing lunges onto BOSU ball forward x 10 B / lateral x 10 B in // bars  Seated blue ball hip flexion/ER with 4# weight around ankles - 10 x B / LAQ with 4# weight on ankles x 10 B / Seated marching with 4# weight around ankles 10 x B       PATIENT EDUCATION:  Education details: Nerve glides/ Ex. Program (Access Code: [email or web address]) Person educated: Patient Education method: Explanation Education comprehension: verbalized understanding   HOME EXERCISE PROGRAM: Access Code: [email or web address] URL: [email or web address] Date: 11/22/2021 Prepared by: Dorcas Carrow   Exercises - Hooklying Transversus Abdominis  Palpation  - 1 x daily - 7 x  weekly - 1 sets - 10 reps - Supine March  - 1 x daily - 7 x weekly - 2 sets - 10 reps - Supine Bridge  - 1 x daily - 7 x weekly - 2 sets - 10 reps - Bridge with Hip Abduction and Resistance - Ground Touches  - 1 x daily - 7 x weekly - 2 sets - 10 reps - Supine Sciatic Nerve Glide  - 1 x daily - 7 x weekly - 3 sets - 4 reps - Thoracic Extension Mobilization on Foam Roll  - 1 x daily - 7 x weekly - 3 sets - 10 reps - Thoracic Extension Mobilization on Foam Roll  - 1 x daily - 7 x weekly - 1 sets - 3 reps - Seated Thoracic Lumbar Extension  - 1 x daily - 7 x weekly - 1 sets - 3 reps  ASSESSMENT:  CLINICAL IMPRESSION:   Pt had increased discomfort this morning after multiple days of sitting at his computer, and was feeling muscular tightness bilaterally in his quadriceps and hamstrings. Pt had pain in his knees throughout tx, which increased with prolonged SLS; which was avoided today throughout intervention. Negative McMurry's bilaterally. Pt education was provided on the importance of maintaining an active lifestyle to decrease joint stiffness associated with ongoing arthritis. Pt benefited from cueing to maintain an upright posture during lunges. Pt. Will continue to benefit from skilled PT services to progress pt. Towards all goals/ pain-free mobility.     OBJECTIVE IMPAIRMENTS decreased endurance, decreased mobility, decreased strength, hypomobility, and pain.   ACTIVITY LIMITATIONS cleaning and occupation.   PERSONAL FACTORS Age, Fitness, and 1 comorbidity:    are also affecting patient's functional outcome.    REHAB POTENTIAL: Good  CLINICAL DECISION MAKING: Stable/uncomplicated  EVALUATION COMPLEXITY: Low   GOALS: Goals reviewed with patient? Yes  SHORT TERM GOALS: Target date: 12/13/21  Pt. Will be independent with core stability/ TrA ex. Program to improve pain-free mobility at home.  Baseline: Pt. Participates with yoga. Goal status: Goal  met   LONG TERM GOALS: Target date: 02/21/22  Pt. Will increase FOTO to 73 to improve pain-free mobility.  Baseline: initail 61; 12/27/21: 67 Goal status: PROGRESSING  2.  Pt. Will report no R posterior hamstring/LE symptoms consistently for 1 week to improve pain-free mobility.   Baseline: R posterior hamstring discomfort/ catching; 12/27/21: reports pain with combined hip flexion/abduction while getting out of car and bed  Goal status: PROGRESSING  3.  Pt. Will report no low back/LE symptoms with sleeping at night. Baseline: increase discomfort with rolling in bed; 12/27/21: able to turn/roll throughout throughout the night without waking  Goal status: MET   PLAN: PT FREQUENCY: 1x/week  PT DURATION: 8 weeks  PLANNED INTERVENTIONS: Therapeutic exercises, Therapeutic activity, Neuromuscular re-education, Balance training, Patient/Family education, Joint mobilization, Dry Needling, Electrical stimulation, Cryotherapy, Moist heat, and Manual therapy.  PLAN FOR NEXT SESSION: Manual therapy to hamstrings, nerve glides to LE's, joint distraction to R hip, and lumbar stabilization via core strengthening.    Andee Lineman, SPT 01/24/2022   13:13PM Merdis Delay, PT, DPT, OCS  [phone]

## 2022-01-30 ENCOUNTER — Ambulatory Visit: Payer: BC Managed Care – PPO

## 2022-01-30 DIAGNOSIS — M6281 Muscle weakness (generalized): Secondary | ICD-10-CM | POA: Diagnosis not present

## 2022-01-30 DIAGNOSIS — M5441 Lumbago with sciatica, right side: Secondary | ICD-10-CM | POA: Diagnosis not present

## 2022-01-30 DIAGNOSIS — G8929 Other chronic pain: Secondary | ICD-10-CM | POA: Diagnosis not present

## 2022-01-30 NOTE — Therapy (Addendum)
OUTPATIENT PHYSICAL THERAPY THORACOLUMBAR TREATMENT   Patient Name: Daniel Lawrence MRN: [id number] DOB:1960-07-25, 61 y.o., male Today's Date: 01/30/2022   PT End of Session - 01/30/22 0909     Visit Number 16    Number of Visits 18    Date for PT Re-Evaluation 02/21/22    PT Start Time 0814    PT Stop Time 0903    PT Time Calculation (min) 49 min    Activity Tolerance Patient tolerated treatment well    Behavior During Therapy [facility] for tasks assessed/performed             Past Medical History:  Diagnosis Date   Allergy    Arthritis    left knee   Cancer ([place])    skin cancer   Cataract    Hyperlipidemia    Hypertension    Past Surgical History:  Procedure Laterality Date   BRAIN SURGERY     ELEVATION OF DEPRESSED SKULL FRACTURE  1971   traumatic blow to head by golf club   , [facility])   MOHS SURGERY  March 2013   left temple, squamous   ROBOT ASSISTED LAPAROSCOPIC NEPHRECTOMY Right 04/20/2016   Procedure: XI ROBOTIC ASSISTED LAPAROSCOPIC RADICAL NEPHRECTOMY;  Surgeon: Alexis Frock, MD;  Location: [facility];  Service: Urology;  Laterality: Right;   Patient Active Problem List   Diagnosis Date Noted   Low back pain 09/15/2021   Cancer ([place]) 06/29/2021   Mass of soft tissue of shoulder 06/29/2021   Mass of soft tissue of neck 06/29/2021   Aortic atherosclerosis ([place]) 04/18/2021   Melanoma in situ of right upper arm ([place]) 04/18/2021   H/O right nephrectomy 04/17/2020   Loss of libido 04/14/2019   Degenerative arthritis of left knee 02/25/2018   Chronic pain of left knee 02/08/2018   CKD (chronic kidney disease) stage 3, GFR 30-59 ml/min (HCC) 02/08/2018   Coronary atherosclerosis due to lipid rich plaque 05/26/2016   History of renal cell carcinoma 04/20/2016   B12 deficiency 04/08/2016   Encounter for preventive health examination 06/24/2014   Hypertension 06/23/2014   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    THERAPY DIAG:  Muscle weakness (generalized)  Chronic bilateral low back pain with right-sided sciatica  ONSET DATE: 07/16/21  SUBJECTIVE:  SUBJECTIVE STATEMENT: Pt presented to clinic with 0/10 pain, but can feel arthritic discomfort in his B knees  (L knee discomfort more prominent than R knee). Pt has not been taking any medication for pain, and has been sleeping through the night without any discomfort. Pt's symptoms have been occurring sporadically in the AM, but it is not a daily occurrence. Symptomology does not occur when getting in and out of the car, nor rolling in bed. Pt was sore for four days after the NuStep, and prefers the SciFit.     PERTINENT HISTORY:  11/02/21 MD visit CC: Low back pain, left knee pain   HPI: Subjective    09/14/2021 Patient does give a episode of potential radiculopathy.  Discussed with patient about icing regimen and home exercises, discussed which activities to do and which ones to avoid.  Once to start with home exercises which was given to him by athletic trainer.  Follow-up again in 6 weeks   Arthritic changes of the knees bilaterally.  Discussed the possibility of injections.  Discussed icing regimen and home exercises.  Patient feels like he is doing relatively well at the moment. Discussed HEP we will get bilateral standing x-rays.  Follow-up again in 4 to 6 weeks.  Worsening pain consider formal physical therapy or injections.   Updated 11/02/2021 Daniel Lawrence is a 61 y.o. male coming in with complaint of LBP and left knee  pain. Patient states that he continues to have sciatic nerve pain in R leg that is occurring more frequently. Would like to go to PT to be held accountable. Does not want to focus on knee pain at this time as his R hip and leg pain is more prevalent.        TODAY'S TREATMENT   01/30/22:  There ex:   Sci fit: L 6.2 for 10 mins - 5 mins forward, 5 mins reverse (UE/LE)  12" step ups with R LE at staircase - pain in L LE, so step ups were stopped on L side. 2 x 10 sets with B UE finger-tap support on railing  Star lunges 6 x per direction B - 90 deg, 60 deg, 45 deg - 18 x total per LE  Forward lunge onto blue airex pad in // bars - 2 sets of 10 with knee tap onto airex pad during each lunge - B  LE  Walking with 2 black TB in // bars with resistance around waist - forward 10 x backwards 10 x providing SBA for safety     Manual Therapy:  LAD of R LE  in supine position - 3 x 30 s (grade 3)  Supine Hip flexion / ER / IR - 30 s/each hold with overpressure at end range B  Prone STM to R hamstrings / trigger point release to lateral R hamstring with 15 s hold x 2  Prone hypervolt to R hamstring - level 2 - 6 minutes to medial muscle belly and into R IT band    PATIENT EDUCATION:  Education details: Nerve glides/ Ex. Program (Access Code: [email or web address]) Person educated: Patient Education method: Explanation Education comprehension: verbalized understanding   HOME EXERCISE PROGRAM: Access Code: [email or web address] URL: [email or web address] Date: 11/22/2021 Prepared by: Dorcas Carrow   Exercises - Hooklying Transversus Abdominis Palpation  - 1 x daily - 7 x weekly - 1 sets - 10 reps - Supine March  - 1 x daily - 7 x weekly - 2 sets - 10 reps - Supine Bridge  -  1 x daily - 7 x weekly - 2 sets - 10 reps - Bridge with Hip Abduction and Resistance - Ground Touches  - 1 x daily - 7 x weekly - 2 sets - 10 reps - Supine Sciatic Nerve Glide  - 1 x daily - 7 x weekly - 3 sets - 4  reps - Thoracic Extension Mobilization on Foam Roll  - 1 x daily - 7 x weekly - 3 sets - 10 reps - Thoracic Extension Mobilization on Foam Roll  - 1 x daily - 7 x weekly - 1 sets - 3 reps - Seated Thoracic Lumbar Extension  - 1 x daily - 7 x weekly - 1 sets - 3 reps  ASSESSMENT:  CLINICAL IMPRESSION:   Pt demonstrated lunges well, without increased discomfort in his posterior LE's at various angles. Pt had pain in L knee with 12" step ups, which he stated to be "excruciating". Pt tolerated 12" step ups on R LE without increased discomfort, and stated that his R quadriceps felt weak throughout that intervention. Pt had minor radicular symptomology reproduced when utilizing the hypervolt to R lateral hamstring, which quickly dissipated after 10-15 seconds of the hypervolt. Pt will benefit from further tx interventions on the blue airex pad, in order to challange his balance with decreased BOS, along with further therapeutic emphasis on quadricep strengthening.  OBJECTIVE IMPAIRMENTS decreased endurance, decreased mobility, decreased strength, hypomobility, and pain.   ACTIVITY LIMITATIONS cleaning and occupation.   PERSONAL FACTORS Age, Fitness, and 1 comorbidity:    are also affecting patient's functional outcome.    REHAB POTENTIAL: Good  CLINICAL DECISION MAKING: Stable/uncomplicated  EVALUATION COMPLEXITY: Low   GOALS: Goals reviewed with patient? Yes  SHORT TERM GOALS: Target date: 12/13/21  Pt. Will be independent with core stability/ TrA ex. Program to improve pain-free mobility at home.  Baseline: Pt. Participates with yoga. Goal status: Goal met   LONG TERM GOALS: Target date: 02/21/22  Pt. Will increase FOTO to 73 to improve pain-free mobility.  Baseline: initail 61; 12/27/21: 67 Goal status: PROGRESSING  2.  Pt. Will report no R posterior hamstring/LE symptoms consistently for 1 week to improve pain-free mobility.   Baseline: R posterior hamstring discomfort/ catching;  12/27/21: reports pain with combined hip flexion/abduction while getting out of car and bed  Goal status: PROGRESSING  3.  Pt. Will report no low back/LE symptoms with sleeping at night. Baseline: increase discomfort with rolling in bed; 12/27/21: able to turn/roll throughout throughout the night without waking  Goal status: MET   PLAN: PT FREQUENCY: 1x/week  PT DURATION: 8 weeks  PLANNED INTERVENTIONS: Therapeutic exercises, Therapeutic activity, Neuromuscular re-education, Balance training, Patient/Family education, Joint mobilization, Dry Needling, Electrical stimulation, Cryotherapy, Moist heat, and Manual therapy.  PLAN FOR NEXT SESSION: Manual therapy to hamstrings, nerve glides to LE's, joint distraction to R hip, and lumbar stabilization via core strengthening.     Salem Caster. Fairly IV, PT, DPT Physical Therapist- [place], [place] 01/30/22

## 2022-01-31 ENCOUNTER — Encounter: Payer: BC Managed Care – PPO | Admitting: Physical Therapy

## 2022-02-01 DIAGNOSIS — Z7689 Persons encountering health services in other specified circumstances: Secondary | ICD-10-CM | POA: Diagnosis not present

## 2022-02-07 ENCOUNTER — Ambulatory Visit: Payer: BC Managed Care – PPO | Admitting: Physical Therapy

## 2022-02-07 ENCOUNTER — Encounter: Payer: Self-pay | Admitting: Physical Therapy

## 2022-02-07 DIAGNOSIS — M5441 Lumbago with sciatica, right side: Secondary | ICD-10-CM | POA: Diagnosis not present

## 2022-02-07 DIAGNOSIS — M6281 Muscle weakness (generalized): Secondary | ICD-10-CM

## 2022-02-07 DIAGNOSIS — G8929 Other chronic pain: Secondary | ICD-10-CM | POA: Diagnosis not present

## 2022-02-07 DIAGNOSIS — F419 Anxiety disorder, unspecified: Secondary | ICD-10-CM | POA: Diagnosis not present

## 2022-02-07 NOTE — Therapy (Signed)
OUTPATIENT PHYSICAL THERAPY THORACOLUMBAR TREATMENT/ DISCHARGE   Patient Name: Daniel Lawrence MRN: [id number] DOB:[birth year], 61 y.o., male Today's Date: 02/07/2022    PT End of Session - 02/07/22 0957     Visit Number 17    Number of Visits 18    Date for PT Re-Evaluation 02/21/22    PT Start Time 0814    PT Stop Time 0903    PT Time Calculation (min) 49 min    Activity Tolerance Patient tolerated treatment well    Behavior During Therapy [facility] for tasks assessed/performed              Past Medical History:  Diagnosis Date   Allergy    Arthritis    left knee   Cancer ([place])    skin cancer   Cataract    Hyperlipidemia    Hypertension    Past Surgical History:  Procedure Laterality Date   BRAIN SURGERY     ELEVATION OF DEPRESSED SKULL FRACTURE  1971   traumatic blow to head by golf club   , [facility])   MOHS SURGERY  March 2013   left temple, squamous   ROBOT ASSISTED LAPAROSCOPIC NEPHRECTOMY Right 04/20/2016   Procedure: XI ROBOTIC ASSISTED LAPAROSCOPIC RADICAL NEPHRECTOMY;  Surgeon: Alexis Frock, MD;  [facility];  Service: Urology;  Laterality: Right;   Patient Active Problem List   Diagnosis Date Noted   Low back pain 09/15/2021   Cancer ([place]) 06/29/2021   Mass of soft tissue of shoulder 06/29/2021   Mass of soft tissue of neck 06/29/2021   Aortic atherosclerosis ([place]) 04/18/2021   Melanoma in situ of right upper arm ([place]) 04/18/2021   H/O right nephrectomy 04/17/2020   Loss of libido 04/14/2019   Degenerative arthritis of left knee 02/25/2018   Chronic pain of left knee 02/08/2018   CKD (chronic kidney disease) stage 3, GFR 30-59 ml/min (HCC) 02/08/2018   Coronary atherosclerosis due to lipid rich plaque 05/26/2016   History of renal cell carcinoma 04/20/2016   B12 deficiency 04/08/2016   Encounter for preventive health examination 06/24/2014   Hypertension 06/23/2014   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    THERAPY DIAG:  Muscle weakness (generalized)  Chronic bilateral low back pain with right-sided sciatica  ONSET DATE: 07/16/21  SUBJECTIVE:  SUBJECTIVE STATEMENT: Pt states he has continued to be pain-free and understands HEP.  Pt. States he is not afraid of certain movement patterns.      PERTINENT HISTORY:  11/02/21 MD visit CC: Low back pain, left knee pain   HPI: Subjective    09/14/2021 Patient does give a episode of potential radiculopathy.  Discussed with patient about icing regimen and home exercises, discussed which activities to do and which ones to avoid.  Once to start with home exercises which was given to him by athletic trainer.  Follow-up again in 6 weeks   Arthritic changes of the knees bilaterally.  Discussed the possibility of injections.  Discussed icing regimen and home exercises.  Patient feels like he is doing relatively well at the moment. Discussed HEP we will get bilateral standing x-rays.  Follow-up again in 4 to 6 weeks.  Worsening pain consider formal physical therapy or injections.   Updated 11/02/2021 Daniel Lawrence is a 61 y.o. male coming in with complaint of LBP and left knee pain. Patient states that he continues to have sciatic nerve pain in R leg that is occurring more frequently. Would like to go to PT to be held accountable. Does not want to focus on knee pain at this time as his R hip and leg pain is more prevalent.        TODAY'S TREATMENT   02/07/22:  There ex:   Scifit: L7 for 10 mins - 5 mins forward, 5 mins  reverse (UE/LE)- consistent cadence/ discussed daily activity/ Yoga.  Star lunges 6 x per direction B - 90 deg, 60 deg, 45 deg - 18 x total per LE  Walking full lunges with holds in //-bars on L/R (good mechanics/ no verbal cuing required).    Gait reassessment in hallway at end of tx. Session.    Manual Therapy:  LAD of R LE  in supine position - 3 x 30 s (grade 3)  Supine Hip flexion / ER / IR - 30 s/each hold with overpressure at end range B  Supine/prone STM to R hamstrings / trigger point release to lateral R hamstring with 15 s hold x 2  Prone hypervolt to R hamstring - level 2 - 9 minutes to medial muscle belly and into R IT band    PATIENT EDUCATION:  Education details: Nerve glides/ Ex. Program (Access Code: [email or web address]) Person educated: Patient Education method: Explanation Education comprehension: verbalized understanding   HOME EXERCISE PROGRAM: Access Code: [email or web address] URL: [email or web address] Date: 11/22/2021 Prepared by: Dorcas Carrow   Exercises - Hooklying Transversus Abdominis Palpation  - 1 x daily - 7 x weekly - 1 sets - 10 reps - Supine March  - 1 x daily - 7 x weekly - 2 sets - 10 reps - Supine Bridge  - 1 x daily - 7 x weekly - 2 sets - 10 reps - Bridge with Hip Abduction and Resistance - Ground Touches  - 1 x daily - 7 x weekly - 2 sets - 10 reps - Supine Sciatic Nerve Glide  - 1 x daily - 7 x weekly - 3 sets - 4 reps - Thoracic Extension Mobilization on Foam Roll  - 1 x daily - 7 x weekly - 3 sets - 10 reps - Thoracic Extension Mobilization on Foam Roll  - 1 x daily - 7 x weekly - 1 sets - 3 reps - Seated Thoracic Lumbar Extension  - 1 x daily - 7 x weekly - 1 sets -  3 reps  ASSESSMENT:  CLINICAL IMPRESSION:   Pt has progressed well towards all PT goals.  Pt. Understands HEP and importance of R hamstring/ neural glides.  Pt. Instructed to contact PT if any regression in symptoms or questions.  Discharge from skilled PT services at this  time.    OBJECTIVE IMPAIRMENTS decreased endurance, decreased mobility, decreased strength, hypomobility, and pain.   ACTIVITY LIMITATIONS cleaning and occupation.   PERSONAL FACTORS Age, Fitness, and 1 comorbidity:    are also affecting patient's functional outcome.   REHAB POTENTIAL: Good  CLINICAL DECISION MAKING: Stable/uncomplicated  EVALUATION COMPLEXITY: Low   GOALS: Goals reviewed with patient? Yes  SHORT TERM GOALS: Target date: 12/13/21  Pt. Will be independent with core stability/ TrA ex. Program to improve pain-free mobility at home.  Baseline: Pt. Participates with yoga. Goal status: Goal met   LONG TERM GOALS: Target date: 02/21/22  Pt. Will increase FOTO to 73 to improve pain-free mobility.  Baseline: initail 61; 12/27/21: 67. 7/26: 83 Goal status: Goal met  2.  Pt. Will report no R posterior hamstring/LE symptoms consistently for 1 week to improve pain-free mobility.   Baseline: R posterior hamstring discomfort/ catching; 12/27/21: reports pain with combined hip flexion/abduction while getting out of car and bed  Goal status: Goal met  3.  Pt. Will report no low back/LE symptoms with sleeping at night. Baseline: increase discomfort with rolling in bed; 12/27/21: able to turn/roll throughout throughout the night without waking  Goal status: MET   PLAN: PT FREQUENCY: 1x/week  PT DURATION: 8 weeks  PLANNED INTERVENTIONS: Therapeutic exercises, Therapeutic activity, Neuromuscular re-education, Balance training, Patient/Family education, Joint mobilization, Dry Needling, Electrical stimulation, Cryotherapy, Moist heat, and Manual therapy.  PLAN FOR NEXT SESSION: Discharge visit.      Pura Spice, PT, DPT # [id number] 02/07/22  10:11AM

## 2022-02-22 DIAGNOSIS — D2271 Melanocytic nevi of right lower limb, including hip: Secondary | ICD-10-CM | POA: Diagnosis not present

## 2022-02-22 DIAGNOSIS — L578 Other skin changes due to chronic exposure to nonionizing radiation: Secondary | ICD-10-CM | POA: Diagnosis not present

## 2022-02-22 DIAGNOSIS — L821 Other seborrheic keratosis: Secondary | ICD-10-CM | POA: Diagnosis not present

## 2022-02-22 DIAGNOSIS — L820 Inflamed seborrheic keratosis: Secondary | ICD-10-CM | POA: Diagnosis not present

## 2022-02-22 DIAGNOSIS — L720 Epidermal cyst: Secondary | ICD-10-CM | POA: Diagnosis not present

## 2022-02-22 DIAGNOSIS — D485 Neoplasm of uncertain behavior of skin: Secondary | ICD-10-CM | POA: Diagnosis not present

## 2022-03-20 ENCOUNTER — Ambulatory Visit: Payer: BC Managed Care – PPO | Admitting: Family Medicine

## 2022-03-23 DIAGNOSIS — E663 Overweight: Secondary | ICD-10-CM | POA: Diagnosis not present

## 2022-04-18 ENCOUNTER — Encounter: Payer: BC Managed Care – PPO | Admitting: Internal Medicine

## 2022-04-25 ENCOUNTER — Other Ambulatory Visit: Payer: Self-pay

## 2022-04-26 ENCOUNTER — Other Ambulatory Visit: Payer: Self-pay | Admitting: Internal Medicine

## 2022-05-10 DIAGNOSIS — Z7689 Persons encountering health services in other specified circumstances: Secondary | ICD-10-CM | POA: Diagnosis not present

## 2022-05-21 ENCOUNTER — Encounter: Payer: Self-pay | Admitting: Internal Medicine

## 2022-05-21 ENCOUNTER — Ambulatory Visit (INDEPENDENT_AMBULATORY_CARE_PROVIDER_SITE_OTHER): Payer: BC Managed Care – PPO | Admitting: Internal Medicine

## 2022-05-21 ENCOUNTER — Other Ambulatory Visit: Payer: Self-pay | Admitting: Internal Medicine

## 2022-05-21 ENCOUNTER — Other Ambulatory Visit: Payer: Self-pay

## 2022-05-21 VITALS — BP 122/72 | HR 63 | Temp 97.9°F | Ht 69.0 in | Wt 194.2 lb

## 2022-05-21 DIAGNOSIS — Z113 Encounter for screening for infections with a predominantly sexual mode of transmission: Secondary | ICD-10-CM

## 2022-05-21 DIAGNOSIS — Z85528 Personal history of other malignant neoplasm of kidney: Secondary | ICD-10-CM

## 2022-05-21 DIAGNOSIS — I2584 Coronary atherosclerosis due to calcified coronary lesion: Secondary | ICD-10-CM

## 2022-05-21 DIAGNOSIS — N1831 Chronic kidney disease, stage 3a: Secondary | ICD-10-CM

## 2022-05-21 DIAGNOSIS — E785 Hyperlipidemia, unspecified: Secondary | ICD-10-CM

## 2022-05-21 DIAGNOSIS — I2510 Atherosclerotic heart disease of native coronary artery without angina pectoris: Secondary | ICD-10-CM

## 2022-05-21 DIAGNOSIS — D1721 Benign lipomatous neoplasm of skin and subcutaneous tissue of right arm: Secondary | ICD-10-CM

## 2022-05-21 DIAGNOSIS — M5441 Lumbago with sciatica, right side: Secondary | ICD-10-CM | POA: Diagnosis not present

## 2022-05-21 DIAGNOSIS — R0789 Other chest pain: Secondary | ICD-10-CM

## 2022-05-21 DIAGNOSIS — Z114 Encounter for screening for human immunodeficiency virus [HIV]: Secondary | ICD-10-CM

## 2022-05-21 DIAGNOSIS — E538 Deficiency of other specified B group vitamins: Secondary | ICD-10-CM

## 2022-05-21 DIAGNOSIS — G8929 Other chronic pain: Secondary | ICD-10-CM

## 2022-05-21 DIAGNOSIS — Z125 Encounter for screening for malignant neoplasm of prostate: Secondary | ICD-10-CM

## 2022-05-21 DIAGNOSIS — Z0000 Encounter for general adult medical examination without abnormal findings: Secondary | ICD-10-CM

## 2022-05-21 DIAGNOSIS — Z23 Encounter for immunization: Secondary | ICD-10-CM | POA: Diagnosis not present

## 2022-05-21 DIAGNOSIS — Z1159 Encounter for screening for other viral diseases: Secondary | ICD-10-CM

## 2022-05-21 MED ORDER — ROSUVASTATIN CALCIUM 20 MG PO TABS
20.0000 mg | ORAL_TABLET | Freq: Every day | ORAL | 1 refills | Status: DC
Start: 2022-05-21 — End: 2022-11-20

## 2022-05-21 MED ORDER — TADALAFIL 5 MG PO TABS
5.0000 mg | ORAL_TABLET | Freq: Every day | ORAL | 11 refills | Status: DC
  Filled 2022-05-21: qty 30, 30d supply, fill #0

## 2022-05-21 MED ORDER — TADALAFIL 5 MG PO TABS: 5.0000 mg | ORAL_TABLET | Freq: Every day | ORAL | 11 refills | Status: DC

## 2022-05-21 NOTE — Assessment & Plan Note (Addendum)
Found by patient  in Oct 2022  referred to [facility] and seen by specialist  who ordered MRI brachial plexus.  No obvious evidence of a sarcoma.  Annual surveillance MRI advised

## 2022-05-21 NOTE — Assessment & Plan Note (Signed)
He has had no recurrence by repeat imaging

## 2022-05-21 NOTE — Patient Instructions (Signed)
Cardiology referral in process  Cialis instead of viagra (for spontaneity and cost purposes!)  Increase crestor to 20 mg daily  goal LDL of 70  in 6 weeks

## 2022-05-21 NOTE — Assessment & Plan Note (Signed)

## 2022-05-21 NOTE — Progress Notes (Signed)
The patient is here for annual preventive examination and management of other chronic and acute problems.   The risk factors are reflected in the social history.   The roster of all physicians providing medical care to patient - is listed in the Snapshot section of the chart.   Activities of daily living:  The patient is 100% independent in all ADLs: dressing, toileting, feeding as well as independent mobility   Home safety : The patient has smoke detectors in the home. They wear seatbelts.  There are no unsecured firearms at home. There is no violence in the home.    There is no risks for hepatitis, STDs or HIV. There is no   history of blood transfusion. They have no travel history to infectious disease endemic areas of the world.   The patient has seen their dentist in the last six month. They have seen their eye doctor in the last year. The patinet  denies slight hearing difficulty with regard to whispered voices and some television programs.  They have deferred audiologic testing in the last year.  They do not  have excessive sun exposure. Discussed the need for sun protection: hats, long sleeves and use of sunscreen if there is significant sun exposure.    Diet: the importance of a healthy diet is discussed. They do have a healthy diet.   The benefits of regular aerobic exercise were discussed. The patient  exercises  3 to 5 days per week  for  60 minutes.    Depression screen: there are no signs or vegative symptoms of depression- irritability, change in appetite, anhedonia, sadness/tearfullness.   The following portions of the patient's history were reviewed and updated as appropriate: allergies, current medications, past family history, past medical history,  past surgical history, past social history  and problem list.   Visual acuity was not assessed per patient preference since the patient has regular follow up with an  ophthalmologist. Hearing and body mass index were assessed and  reviewed.    During the course of the visit the patient was educated and counseled about appropriate screening and preventive services including : fall prevention , diabetes screening, nutrition counseling, colorectal cancer screening, and recommended immunizations.    Chief Complaint:   1 yr follow up colonoscopy scheduled with Dr Tarri Glenn  (last one Oct 2022)    Losing weight intentionally,   11 lbs and 2.5 inches over the last 2 months.  Seeing nutritionist at [facility].   ED:  viagra expensive  spontaneity  an issue.   Sciatica resolved  Neck mass:    Subxiphoid pressure with increased burping.  Drinks 40 to 50 ounces decaf coffe,, has cut back and using famotidine prn   Review of Symptoms  Patient denies headache, fevers, malaise, unintentional weight loss, skin rash, eye pain, sinus congestion and sinus pain, sore throat, dysphagia,  hemoptysis , cough, dyspnea, wheezing, chest pain, palpitations, orthopnea, edema, abdominal pain, nausea, melena, diarrhea, constipation, flank pain, dysuria, hematuria, urinary  Frequency, nocturia, numbness, tingling, seizures,  Focal weakness, Loss of consciousness,  Tremor, insomnia, depression, anxiety, and suicidal ideation.    Physical Exam:  BP 122/72 (BP Location: Left Arm, Patient Position: Sitting, Cuff Size: Normal)   Pulse 63   Temp 97.9 F (36.6 C) (Oral)   Ht '5\' 9"'$  (1.753 m)   Wt 194 lb 3.2 oz (88.1 kg)   SpO2 98%   BMI 28.68 kg/m    General appearance: alert, cooperative and appears stated age Ears:  normal TM's and external ear canals both ears Throat: lips, mucosa, and tongue normal; teeth and gums normal Neck: no adenopathy, no carotid bruit, supple, symmetrical, trachea midline and thyroid not enlarged, symmetric, no tenderness/mass/nodules Back: symmetric, no curvature. ROM normal. No CVA tenderness. Lungs: clear to auscultation bilaterally Heart: regular rate and rhythm, S1, S2 normal, no murmur, click, rub or  gallop Abdomen: soft, non-tender; bowel sounds normal; no masses,  no organomegaly Pulses: 2+ and symmetric Skin: Skin color, texture, turgor normal. No rashes or lesions Lymph nodes: Cervical, supraclavicular, and axillary nodes normal.   Assessment and Plan:  Lipoma of right shoulder Found by patient  in Oct 2022  referred to [facility] and seen by specialist  who ordered MRI brachial plexus.  No obvious evidence of a sarcoma.  Annual surveillance MRI advised   Encounter for preventive health examination age appropriate education and counseling updated, referrals for preventative services and immunizations addressed, dietary and smoking counseling addressed, most recent labs reviewed.  I have personally reviewed and have noted:   1) the patient's medical and social history 2) The pt's use of alcohol, tobacco, and illicit drugs 3) The patient's current medications and supplements 4) Functional ability including ADL's, fall risk, home safety risk, hearing and visual impairment 5) Diet and physical activities 6) Evidence for depression or mood disorder 7) The patient's height, weight, and BMI have been recorded in the chart rhythm.     I have made referrals, and provided counseling and education based on review of the above   History of renal cell carcinoma He has had no recurrence by repeat imaging    Updated Medication List Outpatient Encounter Medications as of 05/21/2022  Medication Sig   Cholecalciferol (VITAMIN D3) 125 MCG (5000 UT) CAPS Take 1 capsule by mouth daily.   Coenzyme Q10 100 MG capsule Take 100 mg by mouth daily.   gabapentin (NEURONTIN) 300 MG capsule Take 1 capsule (300 mg total) by mouth at bedtime.   loratadine (CLARITIN) 10 MG tablet Take 10 mg by mouth daily as needed for allergies.    losartan (COZAAR) 50 MG tablet TAKE 1 TABLET BY MOUTH EVERY DAY   TART CHERRY PO Take 1 capsule by mouth daily. 3000 units daily.   [DISCONTINUED] rosuvastatin (CRESTOR) 10 MG  tablet TAKE 1 TABLET BY MOUTH EVERY DAY   [DISCONTINUED] rosuvastatin (CRESTOR) 5 MG tablet TAKE 1 TABLET (5 MG TOTAL) BY MOUTH DAILY.   [DISCONTINUED] sildenafil (VIAGRA) 50 MG tablet Take 1 tablet (50 mg total) by mouth daily as needed for erectile dysfunction.   [DISCONTINUED] tadalafil (CIALIS) 5 MG tablet Take 1 tablet (5 mg total) by mouth daily.   rosuvastatin (CRESTOR) 20 MG tablet Take 1 tablet (20 mg total) by mouth daily.   tadalafil (CIALIS) 5 MG tablet Take 1 tablet (5 mg total) by mouth daily.   [DISCONTINUED] Black Pepper-Turmeric (TURMERIC CURCUMIN) 11-998 MG CAPS    [DISCONTINUED] Cholecalciferol (VITAMIN D-3) 1000 units CAPS    [DISCONTINUED] fluticasone (FLONASE) 50 MCG/ACT nasal spray Place 2 sprays into both nostrils daily. (Patient not taking: Reported on 05/21/2022)   No facility-administered encounter medications on file as of 05/21/2022.

## 2022-05-24 DIAGNOSIS — E7800 Pure hypercholesterolemia, unspecified: Secondary | ICD-10-CM | POA: Diagnosis not present

## 2022-05-24 DIAGNOSIS — E663 Overweight: Secondary | ICD-10-CM | POA: Diagnosis not present

## 2022-05-25 ENCOUNTER — Other Ambulatory Visit: Payer: Self-pay

## 2022-06-01 ENCOUNTER — Telehealth: Payer: Self-pay | Admitting: *Deleted

## 2022-06-01 ENCOUNTER — Encounter: Payer: Self-pay | Admitting: *Deleted

## 2022-06-01 NOTE — Patient Outreach (Signed)
  Care Coordination   Initial Visit Note   06/01/2022 Name: Daniel Lawrence MRN: [id number] DOB: May 26, 1961  Daniel Lawrence is a 61 y.o. year old male who sees Derrel Nip, Aris Everts, MD for primary care. I spoke with  Daniel Lawrence by phone today.  What matters to the patients health and wellness today?  No needs    Goals Addressed               This Visit's Progress     No needs at this time (pt-stated)        Care Coordination Interventions: Reviewed medications with patient and discussed adherence with all medications with no needed refills Reviewed scheduled/upcoming provider appointments including pending appointments with sufficient transportation source Assessed social determinant of health barriers          SDOH assessments and interventions completed:  Yes  SDOH Interventions Today    Flowsheet Row Most Recent Value  SDOH Interventions   Food Insecurity Interventions Intervention Not Indicated  Housing Interventions Intervention Not Indicated  Transportation Interventions Intervention Not Indicated  Utilities Interventions Intervention Not Indicated        Care Coordination Interventions Activated:  Yes  Care Coordination Interventions:  Yes, provided   Follow up plan: No further intervention required.   Encounter Outcome:  Pt. Visit Completed   Raina Mina, RN Care Management Coordinator [place] Office [phone]

## 2022-06-01 NOTE — Patient Instructions (Signed)
Visit Information  Thank you for taking time to visit with me today. Please don't hesitate to contact me if I can be of assistance to you.   Following are the goals we discussed today:   Goals Addressed               This Visit's Progress     No needs at this time (pt-stated)        Care Coordination Interventions: Reviewed medications with patient and discussed adherence with all medications with no needed refills Reviewed scheduled/upcoming provider appointments including pending appointments with sufficient transportation source Assessed social determinant of health barriers          Please call the care guide team at [phone] if you need to cancel or reschedule your appointment.   If you are experiencing a Mental Health or [place] or need someone to talk to, please call the Suicide and Crisis Lifeline: 988  Patient verbalizes understanding of instructions and care plan provided today and agrees to view in [place]. Active MyChart status and patient understanding of how to access instructions and care plan via MyChart confirmed with patient.     No further follow up required: No needs    Raina Mina, RN Care Management Coordinator [place] Office [phone]

## 2022-06-04 ENCOUNTER — Ambulatory Visit (AMBULATORY_SURGERY_CENTER): Payer: Self-pay

## 2022-06-04 VITALS — Ht 69.0 in | Wt 192.0 lb

## 2022-06-04 DIAGNOSIS — Z86010 Personal history of colonic polyps: Secondary | ICD-10-CM

## 2022-06-04 MED ORDER — ONDANSETRON HCL 4 MG PO TABS: 4.0000 mg | ORAL_TABLET | ORAL | 0 refills | Status: DC

## 2022-06-04 MED ORDER — NA SULFATE-K SULFATE-MG SULF 17.5-3.13-1.6 GM/177ML PO SOLN: 1.0000 | Freq: Once | ORAL | 0 refills | Status: AC

## 2022-06-04 NOTE — Progress Notes (Signed)
No egg or soy allergy known to patient   No issues known to pt with past sedation  with any surgeries or procedures  Patient denies ever being told they had issues or difficulty with intubation   No FH of Malignant Hyperthermia  Pt is not on diet pills  Pt is not on  home 02   Pt is not on blood thinners   Pt denies issues with constipation   No A fib or A flutter  Have any cardiac testing pending--no  Pt instructed to use Single[employer]com or GoodRx for a price reduction on prep   5 days of miralax prior to colonoscopy due to adequate prep last time in 2022
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
## 2022-06-26 ENCOUNTER — Ambulatory Visit (INDEPENDENT_AMBULATORY_CARE_PROVIDER_SITE_OTHER): Payer: BC Managed Care – PPO | Admitting: Family Medicine

## 2022-06-26 ENCOUNTER — Encounter: Payer: Self-pay | Admitting: Family Medicine

## 2022-06-26 VITALS — BP 124/70 | HR 67 | Ht 69.0 in | Wt 191.0 lb

## 2022-06-26 DIAGNOSIS — M5441 Lumbago with sciatica, right side: Secondary | ICD-10-CM

## 2022-06-26 DIAGNOSIS — M25562 Pain in left knee: Secondary | ICD-10-CM | POA: Diagnosis not present

## 2022-06-26 DIAGNOSIS — G8929 Other chronic pain: Secondary | ICD-10-CM

## 2022-06-26 NOTE — Assessment & Plan Note (Signed)
Patient was having radicular symptoms.  Was doing well with physical therapy but would like to see if he can potentially do more now that we have more evidence with the MRI.  Discussed with patient about icing regimen and home exercises continue to work on core strength.  Patient does not have any type of recurrence of cancer noted at the moment.  Patient will start increasing activity.  Follow-up again in 6 to 8 weeks total time with patient 33 minutes

## 2022-06-26 NOTE — Assessment & Plan Note (Signed)
Left knee does have some degenerative changes noted.  Will start with formal physical therapy.  Still holding on any type of injection if possible.  Patient may need elbow in the long run.  May need to consider this in greater detail.  Follow-up again in 6 to 8 weeks

## 2022-06-26 NOTE — Addendum Note (Signed)
Addended by: Carmie Kanner on: 06/26/2022 01:30 PM   Modules accepted: Orders

## 2022-06-26 NOTE — Patient Instructions (Signed)
Glad you are doing well PT at [facility] for back and knee No change in medications  See me again in 3 months

## 2022-06-27 DIAGNOSIS — Z7689 Persons encountering health services in other specified circumstances: Secondary | ICD-10-CM | POA: Diagnosis not present

## 2022-07-02 ENCOUNTER — Other Ambulatory Visit (INDEPENDENT_AMBULATORY_CARE_PROVIDER_SITE_OTHER): Payer: BC Managed Care – PPO

## 2022-07-02 DIAGNOSIS — E538 Deficiency of other specified B group vitamins: Secondary | ICD-10-CM | POA: Diagnosis not present

## 2022-07-02 DIAGNOSIS — Z113 Encounter for screening for infections with a predominantly sexual mode of transmission: Secondary | ICD-10-CM | POA: Diagnosis not present

## 2022-07-02 DIAGNOSIS — E785 Hyperlipidemia, unspecified: Secondary | ICD-10-CM

## 2022-07-02 DIAGNOSIS — D696 Thrombocytopenia, unspecified: Secondary | ICD-10-CM

## 2022-07-02 DIAGNOSIS — N1831 Chronic kidney disease, stage 3a: Secondary | ICD-10-CM | POA: Diagnosis not present

## 2022-07-02 DIAGNOSIS — Z125 Encounter for screening for malignant neoplasm of prostate: Secondary | ICD-10-CM

## 2022-07-02 DIAGNOSIS — Z85528 Personal history of other malignant neoplasm of kidney: Secondary | ICD-10-CM | POA: Diagnosis not present

## 2022-07-02 LAB — LIPID PANEL
Cholesterol: 138 mg/dL (ref 0–200)
HDL: 48.7 mg/dL (ref >39.00)
LDL Cholesterol: 61 mg/dL (ref 0–99)
NonHDL: 89.16
Total CHOL/HDL Ratio: 3
Triglycerides: 141 mg/dL (ref 0.0–149.0)
VLDL: 28.2 mg/dL (ref 0.0–40.0)

## 2022-07-02 LAB — COMPREHENSIVE METABOLIC PANEL
ALT: 21 U/L (ref 0–53)
AST: 20 U/L (ref 0–37)
Albumin: 4.3 g/dL (ref 3.5–5.2)
Alkaline Phosphatase: 52 U/L (ref 39–117)
BUN: 14 mg/dL (ref 6–23)
CO2: 29 mEq/L (ref 19–32)
Calcium: 9.1 mg/dL (ref 8.4–10.5)
Chloride: 100 mEq/L (ref 96–112)
Creatinine, Ser: 1.42 mg/dL (ref 0.40–1.50)
GFR: 52.68 mL/min — ABNORMAL LOW (ref >60.00)
Glucose, Bld: 94 mg/dL (ref 70–99)
Potassium: 4.2 mEq/L (ref 3.5–5.1)
Sodium: 136 mEq/L (ref 135–145)
Total Bilirubin: 0.6 mg/dL (ref 0.2–1.2)
Total Protein: 6.7 g/dL (ref 6.0–8.3)

## 2022-07-02 LAB — CBC WITH DIFFERENTIAL/PLATELET
Basophils Absolute: 0 10*3/uL (ref 0.0–0.1)
Basophils Relative: 1.6 % (ref 0.0–3.0)
Eosinophils Absolute: 0.2 10*3/uL (ref 0.0–0.7)
Eosinophils Relative: 5.2 % — ABNORMAL HIGH (ref 0.0–5.0)
HCT: 47.1 % (ref 39.0–52.0)
Hemoglobin: 15.9 g/dL (ref 13.0–17.0)
Lymphocytes Relative: 24.3 % (ref 12.0–46.0)
Lymphs Abs: 0.7 10*3/uL (ref 0.7–4.0)
MCHC: 33.8 g/dL (ref 30.0–36.0)
MCV: 90.4 fl (ref 78.0–100.0)
Monocytes Absolute: 0.4 10*3/uL (ref 0.1–1.0)
Monocytes Relative: 12.3 % — ABNORMAL HIGH (ref 3.0–12.0)
Neutro Abs: 1.7 10*3/uL (ref 1.4–7.7)
Neutrophils Relative %: 56.6 % (ref 43.0–77.0)
Platelets: 102 10*3/uL — ABNORMAL LOW (ref 150.0–400.0)
RBC: 5.2 Mil/uL (ref 4.22–5.81)
RDW: 13.2 % (ref 11.5–15.5)
WBC: 3 10*3/uL — ABNORMAL LOW (ref 4.0–10.5)

## 2022-07-02 LAB — PSA: PSA: 0.88 ng/mL (ref 0.10–4.00)

## 2022-07-02 LAB — VITAMIN B12: Vitamin B-12: 403 pg/mL (ref 211–911)

## 2022-07-03 ENCOUNTER — Encounter: Payer: Self-pay | Admitting: Family Medicine

## 2022-07-03 ENCOUNTER — Encounter: Payer: Self-pay | Admitting: Internal Medicine

## 2022-07-03 DIAGNOSIS — R972 Elevated prostate specific antigen [PSA]: Secondary | ICD-10-CM | POA: Insufficient documentation

## 2022-07-03 LAB — HIV ANTIBODY (ROUTINE TESTING W REFLEX): HIV 1&2 Ab, 4th Generation: NONREACTIVE

## 2022-07-03 LAB — HEPATITIS C ANTIBODY: Hepatitis C Ab: NONREACTIVE

## 2022-07-03 NOTE — Addendum Note (Signed)
Addended by: [name] on: 07/03/2022 07:07 PM   Modules accepted: Orders

## 2022-07-10 ENCOUNTER — Encounter: Payer: Self-pay | Admitting: Gastroenterology

## 2022-07-10 ENCOUNTER — Encounter: Payer: Self-pay | Admitting: Certified Registered Nurse Anesthetist

## 2022-07-11 ENCOUNTER — Ambulatory Visit: Payer: BC Managed Care – PPO | Attending: Family Medicine | Admitting: Physical Therapy

## 2022-07-11 DIAGNOSIS — M6281 Muscle weakness (generalized): Secondary | ICD-10-CM | POA: Insufficient documentation

## 2022-07-11 DIAGNOSIS — M5441 Lumbago with sciatica, right side: Secondary | ICD-10-CM | POA: Insufficient documentation

## 2022-07-11 DIAGNOSIS — G8929 Other chronic pain: Secondary | ICD-10-CM | POA: Insufficient documentation

## 2022-07-11 DIAGNOSIS — M25562 Pain in left knee: Secondary | ICD-10-CM | POA: Insufficient documentation

## 2022-07-12 ENCOUNTER — Ambulatory Visit (AMBULATORY_SURGERY_CENTER): Payer: BC Managed Care – PPO | Admitting: Gastroenterology

## 2022-07-12 ENCOUNTER — Encounter: Payer: Self-pay | Admitting: Gastroenterology

## 2022-07-12 VITALS — BP 116/72 | HR 63 | Temp 98.2°F | Resp 8 | Ht 69.0 in | Wt 192.0 lb

## 2022-07-12 DIAGNOSIS — Z09 Encounter for follow-up examination after completed treatment for conditions other than malignant neoplasm: Secondary | ICD-10-CM

## 2022-07-12 DIAGNOSIS — D120 Benign neoplasm of cecum: Secondary | ICD-10-CM

## 2022-07-12 DIAGNOSIS — Z86010 Personal history of colonic polyps: Secondary | ICD-10-CM | POA: Diagnosis not present

## 2022-07-12 DIAGNOSIS — Z1211 Encounter for screening for malignant neoplasm of colon: Secondary | ICD-10-CM | POA: Diagnosis not present

## 2022-07-12 MED ORDER — SODIUM CHLORIDE 0.9 % IV SOLN: 500.0000 mL | Freq: Once | INTRAVENOUS | Status: DC

## 2022-07-12 NOTE — Patient Instructions (Signed)
Patient has a contact number available for emergencies. The signs and symptoms of potential delayed complications were discussed with the patient. Return to normal activities tomorrow. Written discharge instructions were provided to the patient. - Continue present medications. - Await pathology results. - Repeat colonoscopy in 7 years for surveillance with a 2 day bowel prep at that time. - Follow a high fiber diet. Drink at least 64 ounces of water daily. Add a daily stool bulking agent such as psyllium (an exampled would be Metamucil). - Emerging evidence supports eating a diet of fruits, vegetables, grains, calcium, and yogurt while reducing red meat and alcohol may reduce the risk of colon cancer. - Thank you for allowing me to be involved in your colon cancer prevention. Recommendation: [place] on polyps and diverticulosis provided.  YOU HAD AN ENDOSCOPIC PROCEDURE TODAY AT [facility]:   Refer to the procedure report that was given to you for any specific questions about what was found during the examination.  If the procedure report does not answer your questions, please call your gastroenterologist to clarify.  If you requested that your care partner not be given the details of your procedure findings, then the procedure report has been included in a sealed envelope for you to review at your convenience later.  YOU SHOULD EXPECT: Some feelings of bloating in the abdomen. Passage of more gas than usual.  Walking can help get rid of the air that was put into your GI tract during the procedure and reduce the bloating. If you had a lower endoscopy (such as a colonoscopy or flexible sigmoidoscopy) you may notice spotting of blood in your stool or on the toilet paper. If you underwent a bowel prep for your procedure, you may not have a normal bowel movement for a few days.  Please Note:  You might notice some irritation and congestion in your nose or some  drainage.  This is from the oxygen used during your procedure.  There is no need for concern and it should clear up in a day or so.  SYMPTOMS TO REPORT IMMEDIATELY:  Following lower endoscopy (colonoscopy or flexible sigmoidoscopy):  Excessive amounts of blood in the stool  Significant tenderness or worsening of abdominal pains  Swelling of the abdomen that is new, acute  Fever of 100F or higher    For urgent or emergent issues, a gastroenterologist can be reached at any hour by calling [phone]. Do not use MyChart messaging for urgent concerns.    DIET:  We do recommend a small meal at first, but then you may proceed to your regular diet.  Drink plenty of fluids but you should avoid alcoholic beverages for 24 hours.  ACTIVITY:  You should plan to take it easy for the rest of today and you should NOT DRIVE or use heavy machinery until tomorrow (because of the sedation medicines used during the test).    FOLLOW UP: Our staff will call the number listed on your records the next business day following your procedure.  We will call around 7:15- 8:00 am to check on you and address any questions or concerns that you may have regarding the information given to you following your procedure. If we do not reach you, we will leave a message.     If any biopsies were taken you will be contacted by phone or by letter within the next 1-3 weeks.  Please call us at [phone] if you have not heard  about the biopsies in 3 weeks.    SIGNATURES/CONFIDENTIALITY: You and/or your care partner have signed paperwork which will be entered into your electronic medical record.  These signatures attest to the fact that that the information above on your After Visit Summary has been reviewed and is understood.  Full responsibility of the confidentiality of this discharge information lies with you and/or your care-partner.

## 2022-07-12 NOTE — Therapy (Signed)
OUTPATIENT PHYSICAL THERAPY LOWER EXTREMITY EVALUATION   Patient Name: Daniel Lawrence MRN: [id number] DOB:11-14-1960, 61 y.o., male Today's Date: 07/12/2022  END OF SESSION:  PT End of Session - 07/12/22 1207     Visit Number 1    Number of Visits 9    Date for PT Re-Evaluation 09/05/22    PT Start Time 0946    PT Stop Time 1034    PT Time Calculation (min) 48 min    Activity Tolerance Patient tolerated treatment well    Behavior During Therapy [facility] for tasks assessed/performed             Past Medical History:  Diagnosis Date   Allergy    seasonal   Arthritis    left knee   Cancer ([place])    skin cancer   Cataract    Hyperlipidemia    Hypertension    Past Surgical History:  Procedure Laterality Date   Black   COLONOSCOPY  05/03/2021   ELEVATION OF DEPRESSED SKULL FRACTURE  07/16/1969   traumatic blow to head by golf club   , [facility])   MOHS SURGERY  09/14/2011   left temple, squamous   ROBOT ASSISTED LAPAROSCOPIC NEPHRECTOMY Right 04/20/2016   Procedure: XI ROBOTIC ASSISTED LAPAROSCOPIC RADICAL NEPHRECTOMY;  Surgeon: Alexis Frock, MD;  Location: [facility];  Service: Urology;  Laterality: Right;   Patient Active Problem List   Diagnosis Date Noted   Increased prostate specific antigen (PSA) velocity 07/03/2022   Lipoma of right shoulder 05/21/2022   Low back pain 09/15/2021   Cancer ([place]) 06/29/2021   Aortic atherosclerosis ([place]) 04/18/2021   Melanoma in situ of right upper arm ([place]) 04/18/2021   H/O right nephrectomy 04/17/2020   Loss of libido 04/14/2019   Degenerative arthritis of left knee 02/25/2018   Chronic pain of left knee 02/08/2018   CKD (chronic kidney disease) stage 3, GFR 30-59 ml/min (HCC) 02/08/2018   Coronary atherosclerosis due to lipid rich plaque 05/26/2016   History of renal cell carcinoma 04/20/2016   B12 deficiency 04/08/2016   Encounter for preventive health examination 06/24/2014   Hypertension 06/23/2014   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 Reports no pain in back while seated at rest.  Pt. C/o increase L medial knee pain with wt. Bearing and states swelling present.  Pt. Reports limitations with increase driving/ sitting and standing due to back pain.  Pt. Also limited with heavy lifting/ carrying tasks.    PERTINENT HISTORY: Pt. Known to PT clinic.  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  See MD note and previous PT evaluation.   PAIN:  Are you having pain? Yes: NPRS scale: 0/10 Pain location: low back/ L medial knee Pain description: sharp Aggravating factors: increase activity/ prolonged sitting Relieving factors: rest  PRECAUTIONS: None  WEIGHT  BEARING RESTRICTIONS: No  FALLS:  Has patient fallen in last 6 months? No  LIVING ENVIRONMENT: Lives with: lives with their spouse Lives in: House/apartment Stairs: Yes: Internal: 14 steps; on right going up Has following equipment at home: None  OCCUPATION: Sales/ Yoga instructor  PLOF: Independent  PATIENT GOALS: return to gym based exercise to increase LE strength.    NEXT MD VISIT: PRN  OBJECTIVE:   DIAGNOSTIC FINDINGS: EXAM: MRI LUMBAR SPINE WITHOUT CONTRAST   TECHNIQUE: Multiplanar, multisequence MR imaging of the lumbar spine was performed. No  intravenous contrast was administered.   COMPARISON:  Lumbar radiographs 11/02/2021, CT abdomen/pelvis 04/02/2016   FINDINGS: Segmentation: Standard; the lowest formed disc space is designated L5-S1.   Alignment: There is mild levocurvature centered at L1-L2. There is trace retrolisthesis of L3 on L4.   Vertebrae: There is anterior compression deformity of the T12 vertebral body with up to approximately 20% loss of vertebral body height anteriorly but no bony retropulsion. This is unchanged since the CT from 2017. Vertebral body heights are otherwise preserved. There are scattered intraosseous hemangiomas, the largest in the L2 vertebral body. There is degenerative endplate marrow signal abnormality at L3-L4. There is no suspicious marrow signal abnormality or marrow edema.   Conus medullaris and cauda equina: Conus extends to the L1-L2 level. Conus and cauda equina appear normal.   Paraspinal and other soft tissues: Unremarkable.   Disc levels:   There is disc desiccation and narrowing most advanced at L3-L4. There is mild desiccation without significant loss of height at the other levels.   T12-L1: No significant spinal canal or neural foraminal stenosis.   L1-L2: No significant spinal canal or neural foraminal stenosis   L2-L3: Mild bilateral facet arthropathy without significant spinal canal or neural foraminal stenosis   L3-L4: There is trace retrolisthesis with degenerative endplate change, a mild disc bulge, and mild bilateral facet arthropathy resulting in mild-to-moderate left and mild right neural foraminal stenosis without significant spinal canal stenosis.   L4-L5: There is moderate bilateral facet arthropathy with small effusions resulting in mild left and no significant right neural foraminal stenosis and no significant spinal canal stenosis.   L5-S1: There is moderate bilateral facet arthropathy with prominent effusions without significant spinal canal  or neural foraminal stenosis.   IMPRESSION: 1. 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 2. Moderate facet arthropathy at L4-L5 and L5-S1 with associated effusions and mild left neural foraminal stenosis at L4-L5. 3. 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     Electronically Signed   By: Valetta Mole M.D.  PATIENT SURVEYS:  FOTO initial 54/ goal 75  COGNITION: Overall cognitive status: Within functional limits for tasks assessed     SENSATION: WFL  EDEMA:  Circumferential: L/R joint line (39/38 cm), mid-gastroc (38.5/39 cm), distal quad (41.5/39.5 cm).    MUSCLE LENGTH: Hamstrings: Right >90 deg; Left >90 deg Marcello Moores test: Right WNL; Left WNL No LLD  POSTURE: No Significant postural limitations.  Pt. Aware of proper upright posture/ correction during there.ex.   PALPATION: L medial knee pain noted as compared to R  LOWER EXTREMITY ROM:   B LE AROM WNL.  L knee (5 to 138 deg.), R knee (2 to 140 deg.).    LOWER EXTREMITY MMT:   R LE strength grossly 5/5 MMT except hip flexion 4+/5, hip abduction 4/5 MMT.  L LE strength grossly 5/5 MMT except hip flexion 4+/5 MMT and hip abduction/ ER 4/5 MMT.    LOWER EXTREMITY SPECIAL TESTS:  Hip special tests: Saralyn Pilar (FABER) test: negative, Ober's test: negative, and Piriformis test: negative Knee special tests: Anterior drawer test: negative, Lachman Test: negative, and McMurray's test: negative  FUNCTIONAL TESTS:  TBD  GAIT: Distance walked: in clinic Assistive device utilized: None Level of assistance: Complete Independence Comments: B knee valgus noted in standing posture/ gait.  Slight L knee lateral shift with wt. Bearing.     TODAY'S TREATMENT:                                                                                                                               DATE: 07/11/22  Scifit L7 10 min. B LE.  No increase pain.    Will issue gym based/ HEP next tx.     PATIENT EDUCATION:  Education details: Gym ex./ [employer] Person educated: Patient Education method: Explanation Education comprehension: verbalized understanding  HOME EXERCISE PROGRAM: Will issue next tx.  ASSESSMENT:  CLINICAL IMPRESSION: Patient is a pleasant 61 y.o. male who was seen today for physical therapy evaluation and treatment for L knee pain/ muscle weakness.   Pt. Presents with knee valgus/ lateral shift with gait and pt. Has generalized muscle weakness in hips.  Pt. Will benefit from short-term skilled PT services to develop a gym based ex. Program to improve pain-free mobility.    OBJECTIVE IMPAIRMENTS: Abnormal gait, decreased activity tolerance, decreased balance, decreased endurance, decreased mobility, difficulty walking, decreased ROM, decreased strength, improper body mechanics, postural dysfunction, and pain.   ACTIVITY LIMITATIONS: carrying, lifting, bending, sitting, standing, transfers, and locomotion level  PARTICIPATION LIMITATIONS: driving, community activity, and occupation  PERSONAL FACTORS: Fitness and Past/current experiences are also affecting patient's functional outcome.   REHAB POTENTIAL: Good  CLINICAL DECISION MAKING: Stable/uncomplicated  EVALUATION COMPLEXITY: Low   GOALS: Goals reviewed with patient? Yes  SHORT TERM GOALS: Target date: 08/08/22 Pt. Independent with HEP to increase B hip/LE strength 1/2 muscle grade to improve pain-free mobility.  Baseline:  see above Goal status: INITIAL   LONG TERM GOALS: Target date: 09/05/22  Pt. Will increase FOTO to 63 to improve pain-free mobility.   Baseline:  initial 54 Goal status: INITIAL  2.  Pt. Will demontrate proper lifting/ carrying technique with no increase c/o back/L knee pain.   Baseline:  increase pain with lifting. Goal status:  INITIAL  3.  Pt. Will participate with consistent gym based ex. At [employer] with no increase c/o knee/low back symptoms.   Baseline:  Goal status: INITIAL  PLAN:  PT FREQUENCY: 1-2x/week  PT DURATION: 8 weeks  PLANNED INTERVENTIONS: Therapeutic exercises, Therapeutic activity, Neuromuscular re-education, Balance training, Gait training, Patient/Family education, Self Care, Joint mobilization, Electrical stimulation, Cryotherapy, Moist heat, and Manual therapy  PLAN FOR NEXT SESSION: Issue gym based ex.  Pura Spice, PT, DPT # [phone]  07/12/2022, 12:10 PM

## 2022-07-12 NOTE — Progress Notes (Signed)
Pt's states no medical or surgical changes since previsit or office visit. 

## 2022-07-12 NOTE — Progress Notes (Signed)
Called to room to assist during endoscopic procedure.  Patient ID and intended procedure confirmed with present staff. Received instructions for my participation in the procedure from the performing physician.  

## 2022-07-12 NOTE — Progress Notes (Signed)
Referring Provider: [name], MD Primary Care Physician:  [name], MD  Indication for Procedure:  Colon cancer Surveillance   IMPRESSION:  Need for colon cancer surveillance History of colon polyp with poor prep at time of last colonoscopy in 2022 Appropriate candidate for monitored anesthesia care  PLAN: Colonoscopy in the [place] today   HPI: Daniel Lawrence is a 61 y.o. male presents for surveillance colonoscopy.  Prior endoscopic history: - Prior colonoscopy in 2012 in [place], results are not available - Colonoscopy 2022: Nonbleeding internal hemorrhoids, left-sided diverticulosis, a 4 mm cecal tubular adenoma, and stool throughout the entire examined colon.  Repeat exam recommended with a 2-day bowel prep.   Past Medical History:  Diagnosis Date   Allergy    seasonal   Arthritis    left knee   Cancer ([place])    skin cancer   Cataract    Hyperlipidemia    Hypertension     Past Surgical History:  Procedure Laterality Date   BRAIN SURGERY  1971   COLONOSCOPY  05/03/2021   ELEVATION OF DEPRESSED SKULL FRACTURE  07/16/1969   traumatic blow to head by golf club   , [facility])   MOHS SURGERY  09/14/2011   left temple, squamous   ROBOT ASSISTED LAPAROSCOPIC NEPHRECTOMY Right 04/20/2016   Procedure: XI ROBOTIC ASSISTED LAPAROSCOPIC RADICAL NEPHRECTOMY;  Surgeon: Alexis Frock, MD;  Location: [facility];  Service: Urology;  Laterality: Right;    Current Outpatient Medications  Medication Sig Dispense Refill   Cholecalciferol (VITAMIN D3) 125 MCG (5000 UT) CAPS Take 1 capsule by mouth daily.     Coenzyme Q10 100 MG capsule Take 100 mg by mouth daily.     gabapentin (NEURONTIN) 300 MG capsule Take 1 capsule (300 mg total) by mouth at bedtime. 90 capsule 1   loratadine (CLARITIN) 10 MG tablet Take 10 mg by mouth daily as needed for allergies.      losartan (COZAAR) 50 MG tablet TAKE 1 TABLET BY MOUTH EVERY DAY 90 tablet 1   Multiple Vitamins-Minerals (CENTRUM  SILVER 50+MEN) TABS      ondansetron (ZOFRAN) 4 MG tablet Take 1 tablet (4 mg total) by mouth as directed for 2 doses. Take one Zofran 4 mg tablet 30-60 minutes before each prep dose 2 tablet 0   Plant Sterol Stanol-Pantethine (CHOLESTOFF COMPLETE) 300-100 MG CAPS      rosuvastatin (CRESTOR) 20 MG tablet Take 1 tablet (20 mg total) by mouth daily. 90 tablet 1   tadalafil (CIALIS) 5 MG tablet Take 1 tablet (5 mg total) by mouth daily. 30 tablet 11   TART CHERRY PO Take 1 capsule by mouth daily. 3000 units daily.     Current Facility-Administered Medications  Medication Dose Route Frequency Provider Last Rate Last Admin   0.9 %  sodium chloride infusion  500 mL Intravenous Once Thornton Park, MD        Allergies as of 07/12/2022 - Review Complete 07/10/2022  Allergen Reaction Noted   Sulfa antibiotics Rash 07/13/2013    Family History  Problem Relation Age of Onset   Alzheimer's disease Mother    Heart disease Father        after age 25   Alzheimer's disease Maternal Aunt    Alzheimer's disease Maternal Uncle    Cancer Maternal Grandmother        breast   Cancer Maternal Grandfather        lung,  tobacco abuse   Colon cancer Neg  Hx    Esophageal cancer Neg Hx    Stomach cancer Neg Hx    Colon polyps Neg Hx    Rectal cancer Neg Hx      Physical Exam: General:   Alert,  well-nourished, pleasant and cooperative in NAD Head:  Normocephalic and atraumatic. Eyes:  Sclera clear, no icterus.   Conjunctiva pink. Mouth:  No deformity or lesions.   Neck:  Supple; no masses or thyromegaly. Lungs:  Clear throughout to auscultation.   No wheezes. Heart:  Regular rate and rhythm; no murmurs. Abdomen:  Soft, non-tender, nondistended, normal bowel sounds, no rebound or guarding.  Msk:  Symmetrical. No boney deformities LAD: No inguinal or umbilical LAD Extremities:  No clubbing or edema. Neurologic:  Alert and  oriented x4;  grossly nonfocal Skin:  No obvious rash or bruise. Psych:   Alert and cooperative. Normal mood and affect.     Studies/Results: No results found.    Jeidi Gilles L. Tarri Glenn, MD, MPH 07/12/2022, 8:32 AM

## 2022-07-12 NOTE — Op Note (Signed)
[place] Patient Name: Daniel Lawrence Procedure Date: 07/12/2022 9:12 AM MRN: [id number] Endoscopist: Thornton Park MD, MD, [id number] Age: 61 Referring MD:  Date of Birth: 27-Jun-1961 Gender: Male Account #: [id number] Procedure:                Colonoscopy Indications:              Surveillance: History of adenomatous polyps,                            inadequate prep on last exam (<3yr                           Prior endoscopic history:                           - Prior colonoscopy in 2012 in [place] results                            are not available                           - Colonoscopy 2022: Nonbleeding internal                            hemorrhoids, left-sided diverticulosis, a 4 mm                            cecal tubular adenoma, and stool throughout the                            entire examined colon. Repeat exam recommended with                            a 2-day bowel prep. Medicines:                Monitored Anesthesia Care Procedure:                Pre-Anesthesia Assessment:                           - Prior to the procedure, a History and Physical                            was performed, and patient medications and                            allergies were reviewed. The patient's tolerance of                            previous anesthesia was also reviewed. The risks                            and benefits of the procedure and the sedation                            options and risks were discussed with  the patient.                            All questions were answered, and informed consent                            was obtained. Prior Anticoagulants: The patient has                            taken no anticoagulant or antiplatelet agents. ASA                            Grade Assessment: II - A patient with mild systemic                            disease. After reviewing the risks and benefits,                            the patient was  deemed in satisfactory condition to                            undergo the procedure.                           After obtaining informed consent, the colonoscope                            was passed under direct vision. Throughout the                            procedure, the patient's blood pressure, pulse, and                            oxygen saturations were monitored continuously. The                            Colonoscope was introduced through the anus and                            advanced to the 3 cm into the ileum. A second                            forward view of the right colon was performed. The                            colonoscopy was performed without difficulty. The                            patient tolerated the procedure well. The quality                            of the bowel preparation was good. The terminal  ileum, ileocecal valve, appendiceal orifice, and                            rectum were photographed. Scope In: 9:31:18 AM Scope Out: 9:44:00 AM Scope Withdrawal Time: 0 hours 9 minutes 14 seconds  Total Procedure Duration: 0 hours 12 minutes 42 seconds  Findings:                 The perianal and digital rectal examinations were                            normal.                           Non-bleeding internal hemorrhoids were found.                           A few medium-mouthed and small-mouthed diverticula                            were found in the sigmoid colon and descending                            colon.                           A 3 mm polyp was found in the cecum. The polyp was                            sessile. The polyp was removed with a cold snare.                            Resection and retrieval were complete. Estimated                            blood loss was minimal.                           The exam was otherwise without abnormality on                            direct and retroflexion  views. Complications:            No immediate complications. Estimated Blood Loss:     Estimated blood loss was minimal. Impression:               - Non-bleeding internal hemorrhoids.                           - Diverticulosis in the sigmoid colon and in the                            descending colon.                           - One 3 mm polyp in the cecum, removed with a cold  snare. Resected and retrieved.                           - The examination was otherwise normal on direct                            and retroflexion views. Recommendation:           - Patient has a contact number available for                            emergencies. The signs and symptoms of potential                            delayed complications were discussed with the                            patient. Return to normal activities tomorrow.                            Written discharge instructions were provided to the                            patient.                           - Continue present medications.                           - Await pathology results.                           - Repeat colonoscopy in 7 years for surveillance                            with a 2 day bowel prep at that time.                           - Follow a high fiber diet. Drink at least 64                            ounces of water daily. Add a daily stool bulking                            agent such as psyllium (an exampled would be                            Metamucil).                           - Emerging evidence supports eating a diet of                            fruits, vegetables, grains, calcium, and yogurt  while reducing red meat and alcohol may reduce the                            risk of colon cancer.                           - Thank you for allowing me to be involved in your                            colon cancer prevention. Thornton Park MD,  MD 07/12/2022 9:50:32 AM This report has been signed electronically.

## 2022-07-13 ENCOUNTER — Telehealth: Payer: Self-pay | Admitting: *Deleted

## 2022-07-13 NOTE — Telephone Encounter (Signed)
  Follow up Call-     07/12/2022    8:34 AM 05/03/2021    1:11 PM  Call back number  Post procedure Call Back phone  # [phone]  Permission to leave phone message Yes Yes     Patient questions:  Do you have a fever, pain , or abdominal swelling? No. Pain Score  0 *  Have you tolerated food without any problems? Yes.    Have you been able to return to your normal activities? Yes.    Do you have any questions about your discharge instructions: Diet   No. Medications  No. Follow up visit  No.  Do you have questions or concerns about your Care? No.  Actions: * If pain score is 4 or above: No action needed, pain <4.

## 2022-07-17 ENCOUNTER — Encounter: Payer: Self-pay | Admitting: Gastroenterology

## 2022-07-18 ENCOUNTER — Encounter: Payer: Self-pay | Admitting: Cardiology

## 2022-07-18 ENCOUNTER — Ambulatory Visit: Payer: BC Managed Care – PPO | Attending: Cardiology | Admitting: Cardiology

## 2022-07-18 DIAGNOSIS — R072 Precordial pain: Secondary | ICD-10-CM

## 2022-07-18 DIAGNOSIS — I10 Essential (primary) hypertension: Secondary | ICD-10-CM | POA: Diagnosis not present

## 2022-07-18 DIAGNOSIS — E7800 Pure hypercholesterolemia, unspecified: Secondary | ICD-10-CM | POA: Diagnosis not present

## 2022-07-18 DIAGNOSIS — R079 Chest pain, unspecified: Secondary | ICD-10-CM

## 2022-07-18 MED ORDER — METOPROLOL TARTRATE 100 MG PO TABS: ORAL_TABLET | ORAL | 0 refills | Status: DC

## 2022-07-18 NOTE — Patient Instructions (Signed)
Medication Instructions:   Your physician recommends that you continue on your current medications as directed. Please refer to the Current Medication list given to you today.  *If you need a refill on your cardiac medications before your next appointment, please call your pharmacy*   Lab Work:  Your physician recommends you go to the medical mall to have lab work completed - BMP  If you have labs (blood work) drawn today and your tests are completely normal, you will receive your results only by: MyChart Message (if you have MyChart) OR A paper copy in the mail If you have any lab test that is abnormal or we need to change your treatment, we will call you to review the results.   Testing/Procedures:  Echocardiogram   Your physician has requested that you have an echocardiogram. Echocardiography is a painless test that uses sound waves to create images of your heart. It provides your doctor with information about the size and shape of your heart and how well your heart's chambers and valves are working. This procedure takes approximately one hour. There are no restrictions for this procedure. Please note; depending on visual quality an IV may need to be placed.   2. Your cardiac CT will be scheduled at:   [facility] [address] [place], [place] [address] [phone]  please arrive 15 mins early for check-in and test prep.   Please follow these instructions carefully (unless otherwise directed):  Hold all erectile dysfunction medications at least 3 days (72 hrs) prior to test. (Ie viagra, cialis, sildenafil, tadalafil, etc) We will administer nitroglycerin during this exam.   On the Night Before the Test: Be sure to Drink plenty of water. Do not consume any caffeinated/decaffeinated beverages or chocolate 12 hours prior to your test. Do not take any antihistamines 12 hours prior to your test.  On the Day of the Test: Drink  plenty of water until 1 hour prior to the test. Do not eat any food 1 hour prior to test. You may take your regular medications prior to the test.  Take metoprolol (Lopressor) two hours prior to test.  After the Test: Drink plenty of water. After receiving IV contrast, you may experience a mild flushed feeling. This is normal. On occasion, you may experience a mild rash up to 24 hours after the test. This is not dangerous. If this occurs, you can take Benadryl 25 mg and increase your fluid intake. If you experience trouble breathing, this can be serious. If it is severe call 911 IMMEDIATELY. If it is mild, please call our office.  We will call to schedule your test 2-4 weeks out understanding that some insurance companies will need an authorization prior to the service being performed.   For non-scheduling related questions, please contact the cardiac imaging nurse navigator should you have any questions/concerns: Marchia Bond, Cardiac Imaging Nurse Navigator Gordy Clement, Cardiac Imaging Nurse Navigator [facility] Direct Office Dial: [phone]   For scheduling needs, including cancellations and rescheduling, please call [place], [phone].   Follow-Up: At [facility], you and your health needs are our priority.  As part of our continuing mission to provide you with exceptional heart care, we have created designated Provider Care Teams.  These Care Teams include your primary Cardiologist (physician) and Advanced Practice Providers (APPs -  Physician Assistants and Nurse Practitioners) who all work together to provide you with the care you need, when you need  it.  We recommend signing up for the patient portal called "MyChart".  Sign up information is provided on this After Visit Summary.  MyChart is used to connect with patients for Virtual Visits (Telemedicine).  Patients are able to view lab/test results, encounter notes, upcoming  appointments, etc.  Non-urgent messages can be sent to your provider as well.   To learn more about what you can do with MyChart, go to [email or web address].    Your next appointment:   8 - 10 week(s)  The format for your next appointment:   In Person  Provider:   You may see Kate Sable, MD or one of the following Advanced Practice Providers on your designated Care Team:   Murray Hodgkins, NP Christell Faith, PA-C Cadence Kathlen Mody, PA-C Gerrie Nordmann, NP
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
## 2022-07-19 ENCOUNTER — Ambulatory Visit: Payer: BC Managed Care – PPO | Attending: Family Medicine | Admitting: Physical Therapy

## 2022-07-19 ENCOUNTER — Encounter: Payer: Self-pay | Admitting: Physical Therapy

## 2022-07-19 DIAGNOSIS — M6281 Muscle weakness (generalized): Secondary | ICD-10-CM | POA: Diagnosis not present

## 2022-07-19 DIAGNOSIS — G8929 Other chronic pain: Secondary | ICD-10-CM | POA: Insufficient documentation

## 2022-07-19 DIAGNOSIS — M5441 Lumbago with sciatica, right side: Secondary | ICD-10-CM | POA: Insufficient documentation

## 2022-07-19 DIAGNOSIS — M25562 Pain in left knee: Secondary | ICD-10-CM | POA: Insufficient documentation

## 2022-07-19 DIAGNOSIS — E663 Overweight: Secondary | ICD-10-CM | POA: Diagnosis not present

## 2022-07-19 NOTE — Therapy (Signed)
OUTPATIENT PHYSICAL THERAPY LOWER EXTREMITY TREATMENT   Patient Name: Daniel Lawrence MRN: [id number] DOB:1961/01/11, 62 y.o., male Today's Date: 07/19/2022  END OF SESSION:  PT End of Session - 07/19/22 0731     Visit Number 2    Number of Visits 9    Date for PT Re-Evaluation 09/05/22    PT Start Time 0731    Activity Tolerance Patient tolerated treatment well    Behavior During Therapy [facility] for tasks assessed/performed            0731 to 0819  (48 minutes).   Past Medical History:  Diagnosis Date   Allergy    seasonal   Arthritis    left knee   Cancer ([place])    skin cancer   Cataract    Hyperlipidemia    Hypertension    Past Surgical History:  Procedure Laterality Date   BRAIN SURGERY  1971   COLONOSCOPY  05/03/2021   ELEVATION OF DEPRESSED SKULL FRACTURE  07/16/1969   traumatic blow to head by golf club   , [facility])   MOHS SURGERY  09/14/2011   left temple, squamous   ROBOT ASSISTED LAPAROSCOPIC NEPHRECTOMY Right 04/20/2016   Procedure: XI ROBOTIC ASSISTED LAPAROSCOPIC RADICAL NEPHRECTOMY;  Surgeon: Alexis Frock, MD;  Location: [facility];  Service: Urology;  Laterality: Right;   Patient Active Problem List   Diagnosis Date Noted   Increased prostate specific antigen (PSA) velocity 07/03/2022   Lipoma of right shoulder 05/21/2022   Low back pain 09/15/2021   Cancer ([place]) 06/29/2021   Aortic atherosclerosis ([place]) 04/18/2021   Melanoma in situ of right upper arm ([place]) 04/18/2021   H/O right nephrectomy 04/17/2020   Loss of libido 04/14/2019   Degenerative arthritis of left knee 02/25/2018   Chronic pain of left knee 02/08/2018   CKD (chronic kidney disease) stage 3, GFR 30-59 ml/min (HCC) 02/08/2018   Coronary atherosclerosis due to lipid rich plaque 05/26/2016   History of renal cell carcinoma 04/20/2016   B12 deficiency 04/08/2016   Encounter for preventive health examination 06/24/2014   Hypertension 06/23/2014   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 Reports no pain in back while seated at rest.  Pt. C/o increase L medial knee pain with wt. Bearing and states swelling present.  Pt. Reports limitations with increase driving/ sitting and standing due to back pain.  Pt. Also limited with heavy lifting/ carrying tasks.    PERTINENT HISTORY: Pt. Known to PT clinic.  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  See MD note and previous PT evaluation.   PAIN:  Are you having pain? Yes: NPRS scale: 0/10 Pain location: low back/ L medial knee Pain description: sharp Aggravating factors: increase activity/ prolonged sitting Relieving factors: rest  PRECAUTIONS: None  WEIGHT BEARING RESTRICTIONS: No  FALLS:  Has patient fallen  in last 6 months? No  LIVING ENVIRONMENT: Lives with: lives with their spouse Lives in: House/apartment Stairs: Yes: Internal: 14 steps; on right going up Has following equipment at home: None  OCCUPATION: Sales/ Yoga instructor  PLOF: Independent  PATIENT GOALS: return to gym based exercise to increase LE strength.    NEXT MD VISIT: PRN  OBJECTIVE:   DIAGNOSTIC FINDINGS: EXAM: MRI LUMBAR SPINE WITHOUT CONTRAST   TECHNIQUE: Multiplanar, multisequence MR imaging of the lumbar spine was performed. No intravenous contrast was  administered.   COMPARISON:  Lumbar radiographs 11/02/2021, CT abdomen/pelvis 04/02/2016   FINDINGS: Segmentation: Standard; the lowest formed disc space is designated L5-S1.   Alignment: There is mild levocurvature centered at L1-L2. There is trace retrolisthesis of L3 on L4.   Vertebrae: There is anterior compression deformity of the T12 vertebral body with up to approximately 20% loss of vertebral body height anteriorly but no bony retropulsion. This is unchanged since the CT from 2017. Vertebral body heights are otherwise preserved. There are scattered intraosseous hemangiomas, the largest in the L2 vertebral body. There is degenerative endplate marrow signal abnormality at L3-L4. There is no suspicious marrow signal abnormality or marrow edema.   Conus medullaris and cauda equina: Conus extends to the L1-L2 level. Conus and cauda equina appear normal.   Paraspinal and other soft tissues: Unremarkable.   Disc levels:   There is disc desiccation and narrowing most advanced at L3-L4. There is mild desiccation without significant loss of height at the other levels.   T12-L1: No significant spinal canal or neural foraminal stenosis.   L1-L2: No significant spinal canal or neural foraminal stenosis   L2-L3: Mild bilateral facet arthropathy without significant spinal canal or neural foraminal stenosis   L3-L4: There is trace retrolisthesis with degenerative endplate change, a mild disc bulge, and mild bilateral facet arthropathy resulting in mild-to-moderate left and mild right neural foraminal stenosis without significant spinal canal stenosis.   L4-L5: There is moderate bilateral facet arthropathy with small effusions resulting in mild left and no significant right neural foraminal stenosis and no significant spinal canal stenosis.   L5-S1: There is moderate bilateral facet arthropathy with prominent effusions without significant spinal canal or neural  foraminal stenosis.   IMPRESSION: 1. 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 2. Moderate facet arthropathy at L4-L5 and L5-S1 with associated effusions and mild left neural foraminal stenosis at L4-L5. 3. 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     Electronically Signed   By: Valetta Mole M.D.  PATIENT SURVEYS:  FOTO initial 54/ goal 54  COGNITION: Overall cognitive status: Within functional limits for tasks assessed     SENSATION: WFL  EDEMA:  Circumferential: L/R joint line (39/38 cm), mid-gastroc (38.5/39 cm), distal quad (41.5/39.5 cm).    MUSCLE LENGTH: Hamstrings: Right >90 deg; Left >90 deg Marcello Moores test: Right WNL; Left WNL No LLD  POSTURE: No Significant postural limitations.  Pt. Aware of proper upright posture/ correction during there.ex.   PALPATION: L medial knee pain noted as compared to R  LOWER EXTREMITY ROM:   B LE AROM WNL.  L knee (5 to 138 deg.), R knee (2 to 140 deg.).    LOWER EXTREMITY MMT:   R LE strength grossly 5/5 MMT except hip flexion 4+/5, hip abduction 4/5 MMT.   L LE strength grossly 5/5 MMT except  hip flexion 4+/5 MMT and hip abduction/ ER 4/5 MMT.    LOWER EXTREMITY SPECIAL TESTS:  Hip special tests: Saralyn Pilar (FABER) test: negative, Ober's test: negative, and Piriformis test: negative Knee special tests: Anterior drawer test: negative, Lachman Test: negative, and McMurray's test: negative  FUNCTIONAL TESTS:  TBD  GAIT: Distance walked: in clinic Assistive device utilized: None Level of assistance: Complete Independence Comments: B knee valgus noted in standing posture/ gait.  Slight L knee lateral shift with wt. Bearing.     TODAY'S TREATMENT:                                                                                                                               DATE: 07/19/22  Subjective:  Pt. Reports no pain in L knee or back this morning.  Pt. States he had an episode of L knee discomfort when he pivoted on L knee but pain resolved.  Pt. Planning to join [employer] soon and ex. 3x/week.    There.ex.:  Scifit L7 10 min. B LE.  No increase pain.    Nautilus: resisted gait 110# 5x all 4-planes.  No UE assist required.  SBA from PT for safety/ cuing.      BOSU lunges/ step ups 3x each with light to no UE assist.  Slight L knee discomfort with step down on R LE.    Supine L knee manual isometrics (moderate resistance).    TG bilateral knee flexion 20x/ single leg squats 10x on R/ 5x on L (discomfort/ challenged).    Standing heel raises/ gastroc stretches 10x each.   Manual tx.:  Reassessment of L knee joint mobility.  Excellent L LE flexibility.  No stretching required.       PATIENT EDUCATION:  Education details: Gym ex./ [employer] Person educated: Patient Education method: Explanation Education comprehension: verbalized understanding  HOME EXERCISE PROGRAM: Will issue next tx.  ASSESSMENT:  CLINICAL IMPRESSION: Pt. Works hard during tx. Session and is highly motivated to participate with gym based ex.  Pt. Presents with knee valgus/ lateral shift in knee with gait and pt. Has generalized muscle weakness in hips.  Pt. Did well with LE stability ex. With good balance.  PT discussed ex. Machines at [employer] for pt. To participate with more independent ex.   Pt. Will benefit from short-term skilled PT services to develop a gym based ex. Program to improve pain-free mobility.    OBJECTIVE IMPAIRMENTS: Abnormal gait, decreased activity tolerance, decreased balance, decreased endurance, decreased mobility, difficulty walking, decreased ROM, decreased strength, improper body mechanics, postural dysfunction, and pain.   ACTIVITY LIMITATIONS: carrying, lifting, bending, sitting, standing, transfers, and  locomotion level  PARTICIPATION LIMITATIONS: driving, community activity, and occupation  PERSONAL FACTORS: Fitness and Past/current experiences are also affecting patient's functional outcome.   REHAB POTENTIAL: Good  CLINICAL DECISION MAKING: Stable/uncomplicated  EVALUATION COMPLEXITY: Low   GOALS: Goals reviewed with patient? Yes  SHORT TERM GOALS: Target  date: 08/08/22 Pt. Independent with HEP to increase B hip/LE strength 1/2 muscle grade to improve pain-free mobility.  Baseline:  see above Goal status: INITIAL   LONG TERM GOALS: Target date: 09/05/22  Pt. Will increase FOTO to 63 to improve pain-free mobility.   Baseline:  initial 54 Goal status: INITIAL  2.  Pt. Will demontrate proper lifting/ carrying technique with no increase c/o back/L knee pain.   Baseline:  increase pain with lifting. Goal status: INITIAL  3.  Pt. Will participate with consistent gym based ex. At [employer] with no increase c/o knee/low back symptoms.   Baseline:  Goal status: INITIAL  PLAN:  PT FREQUENCY: 1-2x/week  PT DURATION: 8 weeks  PLANNED INTERVENTIONS: Therapeutic exercises, Therapeutic activity, Neuromuscular re-education, Balance training, Gait training, Patient/Family education, Self Care, Joint mobilization, Electrical stimulation, Cryotherapy, Moist heat, and Manual therapy  PLAN FOR NEXT SESSION: Discuss [employer] ex.   Pura Spice, PT, DPT # [phone] 07/19/2022, 7:33 AM

## 2022-07-19 NOTE — Addendum Note (Signed)
Addended by: Janan Ridge on: 07/19/2022 02:13 PM   Modules accepted: Orders

## 2022-07-21 ENCOUNTER — Other Ambulatory Visit: Payer: Self-pay | Admitting: Family Medicine

## 2022-07-23 ENCOUNTER — Ambulatory Visit: Payer: BC Managed Care – PPO | Attending: Cardiology

## 2022-07-23 DIAGNOSIS — R072 Precordial pain: Secondary | ICD-10-CM

## 2022-07-23 DIAGNOSIS — R079 Chest pain, unspecified: Secondary | ICD-10-CM

## 2022-07-23 LAB — ECHOCARDIOGRAM COMPLETE
AR max vel: 3.03 cm2
AV Area VTI: 2.83 cm2
AV Area mean vel: 3.03 cm2
AV Mean grad: 3 mmHg
AV Peak grad: 4.7 mmHg
Ao pk vel: 1.08 m/s
Area-P 1/2: 3.21 cm2
S' Lateral: 2.5 cm

## 2022-07-24 DIAGNOSIS — D4819 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: Secondary | ICD-10-CM | POA: Diagnosis not present

## 2022-07-24 DIAGNOSIS — R911 Solitary pulmonary nodule: Secondary | ICD-10-CM | POA: Diagnosis not present

## 2022-07-24 DIAGNOSIS — M7989 Other specified soft tissue disorders: Secondary | ICD-10-CM | POA: Diagnosis not present

## 2022-07-25 ENCOUNTER — Ambulatory Visit: Payer: BC Managed Care – PPO | Admitting: Physical Therapy

## 2022-07-25 DIAGNOSIS — M25562 Pain in left knee: Secondary | ICD-10-CM | POA: Diagnosis not present

## 2022-07-25 DIAGNOSIS — M6281 Muscle weakness (generalized): Secondary | ICD-10-CM

## 2022-07-25 DIAGNOSIS — M5441 Lumbago with sciatica, right side: Secondary | ICD-10-CM | POA: Diagnosis not present

## 2022-07-25 DIAGNOSIS — G8929 Other chronic pain: Secondary | ICD-10-CM | POA: Diagnosis not present

## 2022-07-25 NOTE — Therapy (Signed)
OUTPATIENT PHYSICAL THERAPY LOWER EXTREMITY TREATMENT   Patient Name: Daniel Lawrence MRN: [id number] DOB:08-19-1960, 62 y.o., male Today's Date: 07/25/2022  END OF SESSION:  PT End of Session - 07/25/22 0945     Visit Number 3    Number of Visits 9    Date for PT Re-Evaluation 09/05/22    PT Start Time 0945    PT Stop Time 1033    PT Time Calculation (min) 48 min    Activity Tolerance Patient tolerated treatment well    Behavior During Therapy [facility] for tasks assessed/performed             Past Medical History:  Diagnosis Date   Allergy    seasonal   Arthritis    left knee   Cancer ([place])    skin cancer   Cataract    Hyperlipidemia    Hypertension    Past Surgical History:  Procedure Laterality Date   [place]   COLONOSCOPY  05/03/2021   ELEVATION OF DEPRESSED SKULL FRACTURE  07/16/1969   traumatic blow to head by golf club   , [facility])   MOHS SURGERY  09/14/2011   left temple, squamous   ROBOT ASSISTED LAPAROSCOPIC NEPHRECTOMY Right 04/20/2016   Procedure: XI ROBOTIC ASSISTED LAPAROSCOPIC RADICAL NEPHRECTOMY;  Surgeon: Alexis Frock, MD;  Location: [facility];  Service: Urology;  Laterality: Right;   Patient Active Problem List   Diagnosis Date Noted   Increased prostate specific antigen (PSA) velocity 07/03/2022   Lipoma of right shoulder 05/21/2022   Low back pain 09/15/2021   Cancer ([place]) 06/29/2021   Aortic atherosclerosis ([place]) 04/18/2021   Melanoma in situ of right upper arm ([place]) 04/18/2021   H/O right nephrectomy 04/17/2020   Loss of libido 04/14/2019   Degenerative arthritis of left knee 02/25/2018   Chronic pain of left knee 02/08/2018   CKD (chronic kidney disease) stage 3, GFR 30-59 ml/min (HCC) 02/08/2018   Coronary atherosclerosis due to lipid rich plaque 05/26/2016   History of renal cell carcinoma 04/20/2016   B12 deficiency 04/08/2016   Encounter for preventive health examination 06/24/2014   Hypertension 06/23/2014   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 Reports no pain in back while seated at rest.  Pt. C/o increase L medial knee pain with wt. Bearing and states swelling present.  Pt. Reports limitations with increase driving/ sitting and standing due to back pain.  Pt. Also limited with heavy lifting/ carrying tasks.    PERTINENT HISTORY: Pt. Known to PT clinic.  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  See MD note and previous PT evaluation.   PAIN:  Are you having pain? Yes: NPRS scale: 0/10 Pain location: low back/ L medial knee Pain description: sharp Aggravating factors: increase activity/ prolonged sitting Relieving factors: rest  PRECAUTIONS: None  WEIGHT  BEARING RESTRICTIONS: No  FALLS:  Has patient fallen in last 6 months? No  LIVING ENVIRONMENT: Lives with: lives with their spouse Lives in: House/apartment Stairs: Yes: Internal: 14 steps; on right going up Has following equipment at home: None  OCCUPATION: Sales/ Yoga instructor  PLOF: Independent  PATIENT GOALS: return to gym based exercise to increase LE strength.    NEXT MD VISIT: PRN  OBJECTIVE:   DIAGNOSTIC FINDINGS: EXAM: MRI LUMBAR SPINE WITHOUT CONTRAST   TECHNIQUE: Multiplanar, multisequence MR imaging of the lumbar spine was performed. No  intravenous contrast was administered.   COMPARISON:  Lumbar radiographs 11/02/2021, CT abdomen/pelvis 04/02/2016   FINDINGS: Segmentation: Standard; the lowest formed disc space is designated L5-S1.   Alignment: There is mild levocurvature centered at L1-L2. There is trace retrolisthesis of L3 on L4.   Vertebrae: There is anterior compression deformity of the T12 vertebral body with up to approximately 20% loss of vertebral body height anteriorly but no bony retropulsion. This is unchanged since the CT from 2017. Vertebral body heights are otherwise preserved. There are scattered intraosseous hemangiomas, the largest in the L2 vertebral body. There is degenerative endplate marrow signal abnormality at L3-L4. There is no suspicious marrow signal abnormality or marrow edema.   Conus medullaris and cauda equina: Conus extends to the L1-L2 level. Conus and cauda equina appear normal.   Paraspinal and other soft tissues: Unremarkable.   Disc levels:   There is disc desiccation and narrowing most advanced at L3-L4. There is mild desiccation without significant loss of height at the other levels.   T12-L1: No significant spinal canal or neural foraminal stenosis.   L1-L2: No significant spinal canal or neural foraminal stenosis   L2-L3: Mild bilateral facet arthropathy without significant spinal canal or neural foraminal stenosis   L3-L4: There is trace retrolisthesis with degenerative endplate change, a mild disc bulge, and mild bilateral facet arthropathy resulting in mild-to-moderate left and mild right neural foraminal stenosis without significant spinal canal stenosis.   L4-L5: There is moderate bilateral facet arthropathy with small effusions resulting in mild left and no significant right neural foraminal stenosis and no significant spinal canal stenosis.   L5-S1: There is moderate bilateral facet arthropathy with prominent effusions without significant spinal canal  or neural foraminal stenosis.   IMPRESSION: 1. 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 2. Moderate facet arthropathy at L4-L5 and L5-S1 with associated effusions and mild left neural foraminal stenosis at L4-L5. 3. 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     Electronically Signed   By: Valetta Mole M.D.  PATIENT SURVEYS:  FOTO initial 54/ goal 43  COGNITION: Overall cognitive status: Within functional limits for tasks assessed     SENSATION: WFL  EDEMA:  Circumferential: L/R joint line (39/38 cm), mid-gastroc (38.5/39 cm), distal quad (41.5/39.5 cm).    MUSCLE LENGTH: Hamstrings: Right >90 deg; Left >90 deg Marcello Moores test: Right WNL; Left WNL No LLD  POSTURE: No Significant postural limitations.  Pt. Aware of proper upright posture/ correction during there.ex.   PALPATION: L medial knee pain noted as compared to R  LOWER EXTREMITY ROM:   B LE AROM WNL.  L knee (5 to 138 deg.), R knee (2 to 140 deg.).    LOWER EXTREMITY MMT:   R LE strength grossly 5/5 MMT except hip flexion 4+/5, hip abduction 4/5 MMT.  L LE strength grossly 5/5 MMT except hip flexion 4+/5 MMT and hip abduction/ ER 4/5 MMT.    LOWER EXTREMITY SPECIAL TESTS:  Hip special tests: Saralyn Pilar (FABER) test: negative, Ober's test: negative, and Piriformis test: negative Knee special tests: Anterior drawer test: negative, Lachman Test: negative, and McMurray's test: negative  FUNCTIONAL TESTS:  TBD  GAIT: Distance walked: in clinic Assistive device utilized: None Level of assistance: Complete Independence Comments: B knee valgus noted in standing posture/ gait.  Slight L knee lateral shift with wt. Bearing.     TODAY'S TREATMENT:                                                                                                                               DATE: 07/25/22  Subjective:  Pt. Reports no pain in L knee or back this morning.  Pt reports fatigue while finishing up [employer].   Pt. Has not attended [employer] at this time.      There.ex.:  Scifit L8 10 min. B LE.  No increase pain.    Walking in hallway with varying cadence to assess hip/knee stability.  Ascend/ descend stairs with recip. Pattern and no UE assist.    Nautilus: resisted gait 125# 5x all 4-planes.  No UE assist required.  SBA from SPT for safety/ cuing.      Reverse BOSU: wt. Shifting/ squats in //-bars (10x).    Supine L knee manual isometrics (moderate resistance).  Manual gapping of medial aspect of knee to decrease pain/ improve patellar tracking.    TG bilateral knee flexion 20x/ single leg squats 10x on R/ 5x on L (discomfort/ challenged).     PATIENT EDUCATION:  Education details: Gym ex./ [employer] Person educated: Patient Education method: Explanation Education comprehension: verbalized understanding  HOME EXERCISE PROGRAM: Will issue next tx.  ASSESSMENT:  CLINICAL IMPRESSION: Pt. Works hard during tx. Session and is highly motivated to participate with gym based ex.  Pt. Presents with knee valgus/ lateral shift in knee with gait and pt. Has generalized muscle weakness in hips.  Pt. Did well with LE stability ex. With good balance.  PT discussed ex. Machines at [employer] for pt. To participate with more independent ex.   Pt. Will benefit from short-term skilled PT services to develop a gym based ex. Program to improve pain-free mobility.    OBJECTIVE IMPAIRMENTS: Abnormal gait, decreased activity tolerance, decreased balance, decreased endurance, decreased mobility, difficulty walking, decreased ROM, decreased strength, improper body mechanics, postural dysfunction, and pain.   ACTIVITY LIMITATIONS: carrying, lifting, bending, sitting, standing, transfers, and locomotion level  PARTICIPATION LIMITATIONS:  driving, community activity, and occupation  PERSONAL FACTORS: Fitness and Past/current experiences are also affecting patient's functional outcome.   REHAB POTENTIAL: Good  CLINICAL DECISION MAKING: Stable/uncomplicated  EVALUATION COMPLEXITY: Low   GOALS: Goals reviewed with patient? Yes  SHORT TERM GOALS: Target date: 08/08/22 Pt. Independent with HEP to increase B hip/LE strength 1/2  muscle grade to improve pain-free mobility.  Baseline:  see above Goal status: INITIAL   LONG TERM GOALS: Target date: 09/05/22  Pt. Will increase FOTO to 63 to improve pain-free mobility.   Baseline:  initial 54 Goal status: INITIAL  2.  Pt. Will demontrate proper lifting/ carrying technique with no increase c/o back/L knee pain.   Baseline:  increase pain with lifting. Goal status: INITIAL  3.  Pt. Will participate with consistent gym based ex. At [employer] with no increase c/o knee/low back symptoms.   Baseline:  Goal status: INITIAL  PLAN:  PT FREQUENCY: 1-2x/week  PT DURATION: 8 weeks  PLANNED INTERVENTIONS: Therapeutic exercises, Therapeutic activity, Neuromuscular re-education, Balance training, Gait training, Patient/Family education, Self Care, Joint mobilization, Electrical stimulation, Cryotherapy, Moist heat, and Manual therapy  PLAN FOR NEXT SESSION: Discuss [employer] ex.   Pura Spice, PT, DPT # [phone] 07/25/2022, 3:47 PM

## 2022-07-31 ENCOUNTER — Telehealth (HOSPITAL_COMMUNITY): Payer: Self-pay | Admitting: *Deleted

## 2022-07-31 NOTE — Telephone Encounter (Signed)
Patient calling regarding his upcoming cardiac imaging study; pt verbalizes understanding of appt date/time, parking situation and where to check in, pre-test NPO status and medications ordered, and verified current allergies; name and call back number provided for further questions should they arise  Gordy Clement RN Navigator Cardiac Imaging [facility] [phone] office [phone] cell  Patient to take '100mg'$  metoprolol tartrate two hours prior to his cardiac CT scan.

## 2022-08-01 ENCOUNTER — Ambulatory Visit: Payer: BC Managed Care – PPO | Admitting: Physical Therapy

## 2022-08-01 DIAGNOSIS — G8929 Other chronic pain: Secondary | ICD-10-CM

## 2022-08-01 DIAGNOSIS — M6281 Muscle weakness (generalized): Secondary | ICD-10-CM

## 2022-08-01 DIAGNOSIS — M5441 Lumbago with sciatica, right side: Secondary | ICD-10-CM | POA: Diagnosis not present

## 2022-08-01 DIAGNOSIS — M25562 Pain in left knee: Secondary | ICD-10-CM | POA: Diagnosis not present

## 2022-08-01 NOTE — Therapy (Signed)
OUTPATIENT PHYSICAL THERAPY LOWER EXTREMITY TREATMENT   Patient Name: Daniel Lawrence MRN: [id number] DOB:09/24/1960, 62 y.o., male Today's Date: 08/01/2022  END OF SESSION:  PT End of Session - 08/01/22 1116     Visit Number 4    Number of Visits 9    Date for PT Re-Evaluation 09/05/22    PT Start Time 1117    PT Stop Time 1203    PT Time Calculation (min) 46 min    Activity Tolerance Patient tolerated treatment well    Behavior During Therapy [facility] for tasks assessed/performed             Past Medical History:  Diagnosis Date   Allergy    seasonal   Arthritis    left knee   Cancer ([place])    skin cancer   Cataract    Hyperlipidemia    Hypertension    Past Surgical History:  Procedure Laterality Date   [place]   COLONOSCOPY  05/03/2021   ELEVATION OF DEPRESSED SKULL FRACTURE  07/16/1969   traumatic blow to head by golf club   , [facility])   MOHS SURGERY  09/14/2011   left temple, squamous   ROBOT ASSISTED LAPAROSCOPIC NEPHRECTOMY Right 04/20/2016   Procedure: XI ROBOTIC [place];  Surgeon: Alexis Frock, MD;  Location: [facility];  Service: Urology;  Laterality: Right;   Patient Active Problem List   Diagnosis Date Noted   Increased prostate specific antigen (PSA) velocity 07/03/2022   Lipoma of right shoulder 05/21/2022   Low back pain 09/15/2021   Cancer ([place]) 06/29/2021   Aortic atherosclerosis ([place]) 04/18/2021   Melanoma in situ of right upper arm ([place]) 04/18/2021   H/O right nephrectomy 04/17/2020   Loss of libido 04/14/2019   Degenerative arthritis of left knee 02/25/2018   Chronic pain of left knee 02/08/2018   CKD (chronic kidney disease) stage 3, GFR 30-59 ml/min (HCC) 02/08/2018   Coronary atherosclerosis due to lipid rich plaque 05/26/2016   History of renal cell carcinoma 04/20/2016   B12 deficiency 04/08/2016   Encounter for preventive health examination 06/24/2014   Hypertension 06/23/2014   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 Reports no pain in back while seated at rest.  Pt. C/o increase L medial knee pain with wt. Bearing and states swelling present.  Pt. Reports limitations with increase driving/ sitting and standing due to back pain.  Pt. Also limited with heavy lifting/ carrying tasks.    PERTINENT HISTORY: Pt. Known to PT clinic.  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  See MD note and previous PT evaluation.   PAIN:  Are you having pain? Yes: NPRS scale: 0/10 Pain location: low back/ L medial knee Pain description: sharp Aggravating factors: increase activity/ prolonged sitting Relieving factors: rest  PRECAUTIONS: None  WEIGHT  BEARING RESTRICTIONS: No  FALLS:  Has patient fallen in last 6 months? No  LIVING ENVIRONMENT: Lives with: lives with their spouse Lives in: House/apartment Stairs: Yes: Internal: 14 steps; on right going up Has following equipment at home: None  OCCUPATION: Sales/ Yoga instructor  PLOF: Independent  PATIENT GOALS: return to gym based exercise to increase LE strength.    NEXT MD VISIT: PRN  OBJECTIVE:   DIAGNOSTIC FINDINGS: EXAM: MRI LUMBAR SPINE WITHOUT CONTRAST   TECHNIQUE: Multiplanar, multisequence MR imaging of the lumbar spine was performed. No  intravenous contrast was administered.   COMPARISON:  Lumbar radiographs 11/02/2021, CT abdomen/pelvis 04/02/2016   FINDINGS: Segmentation: Standard; the lowest formed disc space is designated L5-S1.   Alignment: There is mild levocurvature centered at L1-L2. There is trace retrolisthesis of L3 on L4.   Vertebrae: There is anterior compression deformity of the T12 vertebral body with up to approximately 20% loss of vertebral body height anteriorly but no bony retropulsion. This is unchanged since the CT from 2017. Vertebral body heights are otherwise preserved. There are scattered intraosseous hemangiomas, the largest in the L2 vertebral body. There is degenerative endplate marrow signal abnormality at L3-L4. There is no suspicious marrow signal abnormality or marrow edema.   Conus medullaris and cauda equina: Conus extends to the L1-L2 level. Conus and cauda equina appear normal.   Paraspinal and other soft tissues: Unremarkable.   Disc levels:   There is disc desiccation and narrowing most advanced at L3-L4. There is mild desiccation without significant loss of height at the other levels.   T12-L1: No significant spinal canal or neural foraminal stenosis.   L1-L2: No significant spinal canal or neural foraminal stenosis   L2-L3: Mild bilateral facet arthropathy without significant spinal canal or neural foraminal stenosis   L3-L4: There is trace retrolisthesis with degenerative endplate change, a mild disc bulge, and mild bilateral facet arthropathy resulting in mild-to-moderate left and mild right neural foraminal stenosis without significant spinal canal stenosis.   L4-L5: There is moderate bilateral facet arthropathy with small effusions resulting in mild left and no significant right neural foraminal stenosis and no significant spinal canal stenosis.   L5-S1: There is moderate bilateral facet arthropathy with prominent effusions without significant spinal canal  or neural foraminal stenosis.   IMPRESSION: 1. 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 2. Moderate facet arthropathy at L4-L5 and L5-S1 with associated effusions and mild left neural foraminal stenosis at L4-L5. 3. 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     Electronically Signed   By: Valetta Mole M.D.  PATIENT SURVEYS:  FOTO initial 54/ goal 53  COGNITION: Overall cognitive status: Within functional limits for tasks assessed     SENSATION: WFL  EDEMA:  Circumferential: L/R joint line (39/38 cm), mid-gastroc (38.5/39 cm), distal quad (41.5/39.5 cm).    MUSCLE LENGTH: Hamstrings: Right >90 deg; Left >90 deg Marcello Moores test: Right WNL; Left WNL No LLD  POSTURE: No Significant postural limitations.  Pt. Aware of proper upright posture/ correction during there.ex.   PALPATION: L medial knee pain noted as compared to R  LOWER EXTREMITY ROM:   B LE AROM WNL.  L knee (5 to 138 deg.), R knee (2 to 140 deg.).    LOWER EXTREMITY MMT:   R LE strength grossly 5/5 MMT except hip flexion 4+/5, hip abduction 4/5 MMT.  L LE strength grossly 5/5 MMT except hip flexion 4+/5 MMT and hip abduction/ ER 4/5 MMT.    LOWER EXTREMITY SPECIAL TESTS:  Hip special tests: Saralyn Pilar (FABER) test: negative, Ober's test: negative, and Piriformis test: negative Knee special tests: Anterior drawer test: negative, Lachman Test: negative, and McMurray's test: negative  FUNCTIONAL TESTS:  TBD  GAIT: Distance walked: in clinic Assistive device utilized: None Level of assistance: Complete Independence Comments: B knee varus noted in standing posture/ gait.  Slight L knee lateral shift with wt. Bearing.     TODAY'S TREATMENT:                                                                                                                               DATE: 08/01/22  Subjective:  Pt. Reports 1/10 pain in L med. knee this morning. Pt. States pain goes up to a 4/10 during Nu[employer] activity. Pt reports starting to attend [employer] and doing the bike for 20 minutes with no onset of sx. Pt. States he wants more education about specific exercises to do at [employer].   There.ex.:  Scifit L8 10 min. B LE.  4/10 pain that resolved after terminating activity.    Forward Monster walks with black band for resistance. 4 laps (from door to wall and back =1) of main clinic area.   Lateral banded(black) walking/side stepping.   Nautilus: resisted gait 125# 5x all 4-planes.  No UE assist required.  SBA from SPT for safety/ cuing.   Reverse BOSU squats in //-bars 2 x 15. Light UE assist required for occasional LOB.     Supine L knee manual isometrics (moderate resistance).  Manual gapping of medial aspect of knee to decrease pain/ improve patellar tracking.     Discussed specific exercises for pt. To perform at [employer]. Will e-mail exercise details to pt.      PATIENT EDUCATION:  Education details: Gym ex./ [employer] Person educated: Patient Education method: Explanation Education comprehension: verbalized understanding  HOME EXERCISE PROGRAM: Will issue next tx.  ASSESSMENT:  CLINICAL IMPRESSION: Pt. Works hard during tx. Session and is highly motivated to participate with gym based ex.  Pt. Presents with knee varus/ lateral shift in knee with gait and pt. Has generalized muscle weakness in hips. Pt. Required cue to keep toes forward and decrease varus movement of knee. PT discussed ex. Machines at [employer] for pt. To participate with more independent ex.  Pt. Will benefit from short-term skilled PT services to develop a gym based ex. Program to improve pain-free mobility.    OBJECTIVE IMPAIRMENTS: Abnormal gait, decreased activity tolerance, decreased balance, decreased endurance,  decreased mobility, difficulty walking, decreased ROM, decreased strength, improper body mechanics, postural dysfunction, and pain.   ACTIVITY LIMITATIONS: carrying, lifting, bending, sitting, standing, transfers, and locomotion level  PARTICIPATION LIMITATIONS: driving, community activity, and occupation  PERSONAL FACTORS: Fitness and Past/current experiences are also affecting patient's functional outcome.  REHAB POTENTIAL: Good  CLINICAL DECISION MAKING: Stable/uncomplicated  EVALUATION COMPLEXITY: Low   GOALS: Goals reviewed with patient? Yes  SHORT TERM GOALS: Target date: 08/08/22 Pt. Independent with HEP to increase B hip/LE strength 1/2 muscle grade to improve pain-free mobility.  Baseline:  see above Goal status: INITIAL   LONG TERM GOALS: Target date: 09/05/22  Pt. Will increase FOTO to 63 to improve pain-free mobility.   Baseline:  initial 54 Goal status: INITIAL  2.  Pt. Will demontrate proper lifting/ carrying technique with no increase c/o back/L knee pain.   Baseline:  increase pain with lifting. Goal status: INITIAL  3.  Pt. Will participate with consistent gym based ex. At [employer] with no increase c/o knee/low back symptoms.   Baseline:  Goal status: INITIAL  PLAN:  PT FREQUENCY: 1-2x/week  PT DURATION: 8 weeks  PLANNED INTERVENTIONS: Therapeutic exercises, Therapeutic activity, Neuromuscular re-education, Balance training, Gait training, Patient/Family education, Self Care, Joint mobilization, Electrical stimulation, Cryotherapy, Moist heat, and Manual therapy  PLAN FOR NEXT SESSION: Review gym based exercise program and discuss discharge    Nabilah Davoli B. Rogers Blocker, SPT Pura Spice, PT, DPT # [id number] 08/01/2022, 4:34 PM

## 2022-08-02 ENCOUNTER — Ambulatory Visit
Admission: RE | Admit: 2022-08-02 | Discharge: 2022-08-02 | Disposition: A | Discharge status: Home / Self Care | Payer: BC Managed Care – PPO | Source: Ambulatory Visit | Attending: Cardiology | Admitting: Cardiology

## 2022-08-02 DIAGNOSIS — R079 Chest pain, unspecified: Secondary | ICD-10-CM | POA: Diagnosis not present

## 2022-08-02 MED ORDER — NITROGLYCERIN 0.4 MG SL SUBL
0.8000 mg | SUBLINGUAL_TABLET | Freq: Once | SUBLINGUAL | Status: AC
  Administered 2022-08-02: 0.8 mg via SUBLINGUAL

## 2022-08-02 MED ORDER — METOPROLOL TARTRATE 5 MG/5ML IV SOLN
10.0000 mg | Freq: Once | INTRAVENOUS | Status: AC
  Administered 2022-08-02: 10 mg via INTRAVENOUS

## 2022-08-02 MED ORDER — IOHEXOL 350 MG/ML SOLN
75.0000 mL | Freq: Once | INTRAVENOUS | Status: AC | PRN
  Administered 2022-08-02: 75 mL via INTRAVENOUS

## 2022-08-02 NOTE — Progress Notes (Signed)
Patient tolerated procedure well. Ambulate w/o difficulty. Denies light headedness or being dizzy. Sitting in chair drinking water provided. Encouraged to drink extra water today and reasoning explained. Verbalized understanding. All questions answered. ABC intact. No further needs. Discharge from procedure area w/o issues.   °

## 2022-08-03 DIAGNOSIS — R918 Other nonspecific abnormal finding of lung field: Secondary | ICD-10-CM | POA: Diagnosis not present

## 2022-08-03 DIAGNOSIS — R911 Solitary pulmonary nodule: Secondary | ICD-10-CM | POA: Diagnosis not present

## 2022-08-03 DIAGNOSIS — M899 Disorder of bone, unspecified: Secondary | ICD-10-CM | POA: Diagnosis not present

## 2022-08-08 ENCOUNTER — Ambulatory Visit: Payer: BC Managed Care – PPO

## 2022-08-08 DIAGNOSIS — M6281 Muscle weakness (generalized): Secondary | ICD-10-CM

## 2022-08-08 DIAGNOSIS — M5441 Lumbago with sciatica, right side: Secondary | ICD-10-CM | POA: Diagnosis not present

## 2022-08-08 DIAGNOSIS — G8929 Other chronic pain: Secondary | ICD-10-CM

## 2022-08-08 DIAGNOSIS — M25562 Pain in left knee: Secondary | ICD-10-CM | POA: Diagnosis not present

## 2022-08-08 NOTE — Therapy (Signed)
OUTPATIENT PHYSICAL THERAPY LOWER EXTREMITY TREATMENT   Patient Name: Daniel Lawrence MRN: [id number] DOB:1961-06-06, 62 y.o., male Today's Date: 08/08/2022  END OF SESSION:  PT End of Session - 08/08/22 0951     Visit Number 5    Number of Visits 9    Date for PT Re-Evaluation 09/05/22    PT Start Time 0948    PT Stop Time 1038    PT Time Calculation (min) 50 min    Activity Tolerance Patient tolerated treatment well    Behavior During Therapy [facility] for tasks assessed/performed             Past Medical History:  Diagnosis Date   Allergy    seasonal   Arthritis    left knee   Cancer ([place])    skin cancer   Cataract    Hyperlipidemia    Hypertension    Past Surgical History:  Procedure Laterality Date   BRAIN SURGERY  1971   COLONOSCOPY  05/03/2021   ELEVATION OF DEPRESSED SKULL FRACTURE  07/16/1969   traumatic blow to head by golf club   , [facility])   MOHS SURGERY  09/14/2011   left temple, squamous   ROBOT ASSISTED LAPAROSCOPIC NEPHRECTOMY Right 04/20/2016   Procedure: XI ROBOTIC ASSISTED LAPAROSCOPIC RADICAL NEPHRECTOMY;  Surgeon: Alexis Frock, MD;  Location: [facility];  Service: Urology;  Laterality: Right;   Patient Active Problem List   Diagnosis Date Noted   Increased prostate specific antigen (PSA) velocity 07/03/2022   Lipoma of right shoulder 05/21/2022   Low back pain 09/15/2021   Cancer ([place]) 06/29/2021   Aortic atherosclerosis ([place]) 04/18/2021   Melanoma in situ of right upper arm ([place]) 04/18/2021   H/O right nephrectomy 04/17/2020   Loss of libido 04/14/2019   Degenerative arthritis of left knee 02/25/2018   Chronic pain of left knee 02/08/2018   CKD (chronic kidney disease) stage 3, GFR 30-59 ml/min (HCC) 02/08/2018   Coronary atherosclerosis due to lipid rich plaque 05/26/2016   History of renal cell carcinoma 04/20/2016   B12 deficiency 04/08/2016   Encounter for preventive health examination 06/24/2014   Hypertension 06/23/2014   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 Reports no pain in back while seated at rest.  Pt. C/o increase L medial knee pain with wt. Bearing and states swelling present.  Pt. Reports limitations with increase driving/ sitting and standing due to back pain.  Pt. Also limited with heavy lifting/ carrying tasks.    PERTINENT HISTORY: Pt. Known to PT clinic.  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  See MD note and previous PT evaluation.   PAIN:  Are you having pain? Yes: NPRS scale: 0/10 Pain location: low back/ L medial knee Pain description: sharp Aggravating factors: increase activity/ prolonged sitting Relieving factors: rest  PRECAUTIONS: None  WEIGHT  BEARING RESTRICTIONS: No  FALLS:  Has patient fallen in last 6 months? No  LIVING ENVIRONMENT: Lives with: lives with their spouse Lives in: House/apartment Stairs: Yes: Internal: 14 steps; on right going up Has following equipment at home: None  OCCUPATION: Sales/ Yoga instructor  PLOF: Independent  PATIENT GOALS: return to gym based exercise to increase LE strength.    NEXT MD VISIT: PRN  OBJECTIVE:   DIAGNOSTIC FINDINGS: EXAM: MRI LUMBAR SPINE WITHOUT CONTRAST   TECHNIQUE: Multiplanar, multisequence MR imaging of the lumbar spine was performed. No  intravenous contrast was administered.   COMPARISON:  Lumbar radiographs 11/02/2021, CT abdomen/pelvis 04/02/2016   FINDINGS: Segmentation: Standard; the lowest formed disc space is designated L5-S1.   Alignment: There is mild levocurvature centered at L1-L2. There is trace retrolisthesis of L3 on L4.   Vertebrae: There is anterior compression deformity of the T12 vertebral body with up to approximately 20% loss of vertebral body height anteriorly but no bony retropulsion. This is unchanged since the CT from 2017. Vertebral body heights are otherwise preserved. There are scattered intraosseous hemangiomas, the largest in the L2 vertebral body. There is degenerative endplate marrow signal abnormality at L3-L4. There is no suspicious marrow signal abnormality or marrow edema.   Conus medullaris and cauda equina: Conus extends to the L1-L2 level. Conus and cauda equina appear normal.   Paraspinal and other soft tissues: Unremarkable.   Disc levels:   There is disc desiccation and narrowing most advanced at L3-L4. There is mild desiccation without significant loss of height at the other levels.   T12-L1: No significant spinal canal or neural foraminal stenosis.   L1-L2: No significant spinal canal or neural foraminal stenosis   L2-L3: Mild bilateral facet arthropathy without significant spinal canal or neural foraminal stenosis   L3-L4: There is trace retrolisthesis with degenerative endplate change, a mild disc bulge, and mild bilateral facet arthropathy resulting in mild-to-moderate left and mild right neural foraminal stenosis without significant spinal canal stenosis.   L4-L5: There is moderate bilateral facet arthropathy with small effusions resulting in mild left and no significant right neural foraminal stenosis and no significant spinal canal stenosis.   L5-S1: There is moderate bilateral facet arthropathy with prominent effusions without significant spinal canal  or neural foraminal stenosis.   IMPRESSION: 1. 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 2. Moderate facet arthropathy at L4-L5 and L5-S1 with associated effusions and mild left neural foraminal stenosis at L4-L5. 3. 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     Electronically Signed   By: Valetta Mole M.D.  PATIENT SURVEYS:  FOTO initial 54/ goal 11  COGNITION: Overall cognitive status: Within functional limits for tasks assessed     SENSATION: WFL  EDEMA:  Circumferential: L/R joint line (39/38 cm), mid-gastroc (38.5/39 cm), distal quad (41.5/39.5 cm).    MUSCLE LENGTH: Hamstrings: Right >90 deg; Left >90 deg Marcello Moores test: Right WNL; Left WNL No LLD  POSTURE: No Significant postural limitations.  Pt. Aware of proper upright posture/ correction during there.ex.   PALPATION: L medial knee pain noted as compared to R  LOWER EXTREMITY ROM:   B LE AROM WNL.  L knee (5 to 138 deg.), R knee (2 to 140 deg.).    LOWER EXTREMITY MMT:   R LE strength grossly 5/5 MMT except hip flexion 4+/5, hip abduction 4/5 MMT.  L LE strength grossly 5/5 MMT except hip flexion 4+/5 MMT and hip abduction/ ER 4/5 MMT.    LOWER EXTREMITY SPECIAL TESTS:  Hip special tests: Saralyn Pilar (FABER) test: negative, Ober's test: negative, and Piriformis test: negative Knee special tests: Anterior drawer test: negative, Lachman Test: negative, and McMurray's test: negative  FUNCTIONAL TESTS:  TBD  GAIT: Distance walked: in clinic Assistive device utilized: None Level of assistance: Complete Independence Comments: B knee varus noted in standing posture/ gait.  Slight L knee lateral shift with wt. Bearing.     TODAY'S TREATMENT:                                                                                                                               DATE: 08/08/22  Subjective:  Pt. Reports 1/10 pain in L med. knee this morning. Pt. States pain goes up to a 2/10 during SCIFIT activity. Pt. reports starting the strengthening program PT gave and having no pain/problems with it.   There.ex.:  Scifit L6-9 10 min. B LE.  2/10 pain that resolved after terminating activity.    Nautilus: resisted gait 125# 5x all 4-planes.  No UE assist required.  SBA from SPT for safety/ cuing.   Bosu lunges(round side up) alternating LE's in front of //-bars for support. No LOB. 1x10  Bosu squats(round side up) in //-bar for UE support. No LOB. 2x15     Supine L knee lateral glides @ joint line to open up medial joint compartment to decrease pain.   PATIENT EDUCATION:  Education details: Gym ex./ [employer] Person educated: Patient Education method: Explanation Education comprehension: verbalized understanding  HOME EXERCISE PROGRAM: Will issue next tx.  ASSESSMENT:  CLINICAL IMPRESSION: Pt. Works hard during tx. Session and is highly motivated to participate with gym based ex.  Pt. Reports 1/10 pain prior to treatment. Pt. Maintained low pain throughout treatment with the exception of 4/10 pain during BOSU lunges. Pt. Presents with knee varus/ lateral shift in knee with gait and pt. Has generalized muscle weakness in hips and knee/ankle static stabilizers. Pt. Required cue to keep toes forward and decrease varus movement of knee during today's there ex. Pt. Experienced no LOB during today's treatment. PT discussed the [employer] gym based exercise program that PT issued to pt. After previous therapy visit. Pt. States no increase in pain with his independent gym based exercises and was educated on focusing on correct form and avoiding over/under loading by avoiding an increase in pain and progressing strength gains via small wt. increments. Pt. Will benefit from short-term skilled PT services to continue developing a gym  based ex. Program to independently improve pain-free mobility.    OBJECTIVE IMPAIRMENTS: Abnormal gait, decreased activity tolerance, decreased balance, decreased endurance, decreased mobility, difficulty walking, decreased ROM, decreased strength, improper body mechanics, postural dysfunction, and pain.   ACTIVITY LIMITATIONS: carrying, lifting, bending, sitting, standing, transfers, and locomotion level  PARTICIPATION LIMITATIONS: driving, community activity,  and occupation  PERSONAL FACTORS: Fitness and Past/current experiences are also affecting patient's functional outcome.   REHAB POTENTIAL: Good  CLINICAL DECISION MAKING: Stable/uncomplicated  EVALUATION COMPLEXITY: Low   GOALS: Goals reviewed with patient? Yes  SHORT TERM GOALS: Target date: 08/08/22 Pt. Independent with HEP to increase B hip/LE strength 1/2 muscle grade to improve pain-free mobility.  Baseline:  see above Goal status: INITIAL   LONG TERM GOALS: Target date: 09/05/22  Pt. Will increase FOTO to 63 to improve pain-free mobility.   Baseline:  initial 54 Goal status: INITIAL  2.  Pt. Will demontrate proper lifting/ carrying technique with no increase c/o back/L knee pain.   Baseline:  increase pain with lifting. Goal status: INITIAL  3.  Pt. Will participate with consistent gym based ex. At [employer] with no increase c/o knee/low back symptoms.   Baseline:  Goal status: INITIAL  PLAN:  PT FREQUENCY: 1-2x/week  PT DURATION: 8 weeks  PLANNED INTERVENTIONS: Therapeutic exercises, Therapeutic activity, Neuromuscular re-education, Balance training, Gait training, Patient/Family education, Self Care, Joint mobilization, Electrical stimulation, Cryotherapy, Moist heat, and Manual therapy  PLAN FOR NEXT SESSION: Review gym based exercise program and discuss discharge   Kiante Ciavarella B. Rogers Blocker SPT Merdis Delay, PT, DPT, OCS  [phone]  08/08/2022, 11:13 AM

## 2022-08-15 ENCOUNTER — Ambulatory Visit: Payer: BC Managed Care – PPO | Admitting: Physical Therapy

## 2022-08-15 ENCOUNTER — Encounter: Payer: Self-pay | Admitting: Physical Therapy

## 2022-08-15 DIAGNOSIS — M5441 Lumbago with sciatica, right side: Secondary | ICD-10-CM | POA: Diagnosis not present

## 2022-08-15 DIAGNOSIS — M6281 Muscle weakness (generalized): Secondary | ICD-10-CM

## 2022-08-15 DIAGNOSIS — M25562 Pain in left knee: Secondary | ICD-10-CM | POA: Diagnosis not present

## 2022-08-15 DIAGNOSIS — G8929 Other chronic pain: Secondary | ICD-10-CM

## 2022-08-15 NOTE — Therapy (Signed)
OUTPATIENT PHYSICAL THERAPY LOWER EXTREMITY TREATMENT/ DISCHARGE   Patient Name: Daniel Lawrence MRN: [id number] DOB:September 06, 1960, 62 y.o., male Today's Date: 08/15/2022  END OF SESSION:  PT End of Session - 08/15/22 1027     Visit Number 6    Number of Visits 9    Date for PT Re-Evaluation 09/05/22    PT Start Time 1027    PT Stop Time 1115    PT Time Calculation (min) 48 min    Activity Tolerance Patient tolerated treatment well    Behavior During Therapy [facility] for tasks assessed/performed             Past Medical History:  Diagnosis Date   Allergy    seasonal   Arthritis    left knee   Cancer ([place])    skin cancer   Cataract    Hyperlipidemia    Hypertension    Past Surgical History:  Procedure Laterality Date   [place]   COLONOSCOPY  05/03/2021   ELEVATION OF DEPRESSED SKULL FRACTURE  07/16/1969   traumatic blow to head by golf club   , [facility])   MOHS SURGERY  09/14/2011   left temple, squamous   ROBOT ASSISTED LAPAROSCOPIC NEPHRECTOMY Right 04/20/2016   Procedure: XI ROBOTIC ASSISTED LAPAROSCOPIC RADICAL NEPHRECTOMY;  Surgeon: Alexis Frock, MD;  Location: [facility];  Service: Urology;  Laterality: Right;   Patient Active Problem List   Diagnosis Date Noted   Increased prostate specific antigen (PSA) velocity 07/03/2022   Lipoma of right shoulder 05/21/2022   Low back pain 09/15/2021   Cancer ([place]) 06/29/2021   Aortic atherosclerosis ([place]) 04/18/2021   Melanoma in situ of right upper arm ([place]) 04/18/2021   H/O right nephrectomy 04/17/2020   Loss of libido 04/14/2019   Degenerative arthritis of left knee 02/25/2018   Chronic pain of left knee 02/08/2018   CKD (chronic kidney disease) stage 3, GFR 30-59 ml/min (HCC) 02/08/2018   Coronary atherosclerosis due to lipid rich plaque 05/26/2016   History of renal cell carcinoma 04/20/2016   B12 deficiency 04/08/2016   Encounter for preventive health examination 06/24/2014   Hypertension 06/23/2014    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 Reports no pain in back while seated at rest.  Pt. C/o increase L medial knee pain with wt. Bearing and states swelling present.  Pt. Reports limitations with increase driving/ sitting and standing due to back pain.  Pt. Also limited with heavy lifting/ carrying tasks.    PERTINENT HISTORY: Pt. Known to PT clinic.  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  See MD note and previous PT evaluation.   PAIN:  Are you having pain? Yes: NPRS scale: 0/10 Pain location: low back/ L medial knee Pain description: sharp Aggravating factors: increase activity/ prolonged sitting Relieving factors: rest  PRECAUTIONS: None  WEIGHT BEARING RESTRICTIONS: No  FALLS:  Has patient fallen in last 6 months? No  LIVING ENVIRONMENT: Lives with: lives with their spouse Lives in: House/apartment Stairs: Yes: Internal: 14 steps; on right going up Has following equipment at home: None  OCCUPATION: Sales/ Yoga instructor  PLOF: Independent  PATIENT GOALS: return to gym based exercise to increase LE strength.    NEXT MD VISIT: PRN  OBJECTIVE:   DIAGNOSTIC FINDINGS: EXAM: MRI LUMBAR SPINE WITHOUT CONTRAST   TECHNIQUE: Multiplanar, multisequence MR imaging of the lumbar spine was performed.  No intravenous contrast was administered.   COMPARISON:  Lumbar radiographs 11/02/2021, CT abdomen/pelvis 04/02/2016   FINDINGS: Segmentation: Standard; the lowest formed disc space is designated L5-S1.   Alignment: There is mild levocurvature centered at L1-L2. There is trace retrolisthesis of L3 on L4.   Vertebrae: There is anterior compression deformity of the T12 vertebral body with up to approximately 20% loss of vertebral body height anteriorly but no bony retropulsion. This is unchanged since the CT from 2017. Vertebral body heights are otherwise preserved. There are scattered intraosseous hemangiomas, the largest in the L2 vertebral body. There is degenerative endplate marrow signal abnormality at L3-L4. There is no suspicious marrow signal abnormality or marrow edema.   Conus medullaris and cauda equina: Conus extends to the L1-L2 level. Conus and cauda equina appear normal.   Paraspinal and other soft tissues: Unremarkable.   Disc levels:   There is disc desiccation and narrowing most advanced at L3-L4. There is mild desiccation without significant loss of height at the other levels.   T12-L1: No significant spinal canal or neural foraminal stenosis.   L1-L2: No significant spinal canal or neural foraminal stenosis   L2-L3: Mild bilateral facet arthropathy without significant spinal canal or neural foraminal stenosis   L3-L4: There is trace retrolisthesis with degenerative endplate change, a mild disc bulge, and mild bilateral facet arthropathy resulting in mild-to-moderate left and mild right neural foraminal stenosis without significant spinal canal stenosis.   L4-L5: There is moderate bilateral facet arthropathy with small effusions resulting in mild left and no significant right neural foraminal stenosis and no significant spinal canal stenosis.   L5-S1: There is moderate bilateral facet arthropathy with prominent effusions without significant spinal  canal or neural foraminal stenosis.   IMPRESSION: 1. 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 2. Moderate facet arthropathy at L4-L5 and L5-S1 with associated effusions and mild left neural foraminal stenosis at L4-L5. 3. 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     Electronically Signed   By: Valetta Mole M.D.  PATIENT SURVEYS:  FOTO initial 54/ goal 52   1/31: 63 (goal met).    COGNITION: Overall cognitive status: Within functional limits for tasks assessed     SENSATION: WFL  EDEMA:  Circumferential: L/R joint line (39/38 cm), mid-gastroc (38.5/39 cm), distal quad (41.5/39.5 cm).    MUSCLE LENGTH: Hamstrings: Right >90 deg; Left >90 deg Marcello Moores test: Right WNL; Left WNL No LLD  POSTURE: No Significant postural limitations.  Pt. Aware of proper upright posture/ correction during there.ex.   PALPATION: L medial knee pain noted as compared to R  LOWER EXTREMITY ROM:   B LE AROM WNL.  L knee (5 to 138 deg.), R knee (2 to 140 deg.).    LOWER EXTREMITY MMT:   R LE strength grossly 5/5  MMT except hip flexion 4+/5, hip abduction 4/5 MMT.   L LE strength grossly 5/5 MMT except hip flexion 4+/5 MMT and hip abduction/ ER 4/5 MMT.    LOWER EXTREMITY SPECIAL TESTS:  Hip special tests: Saralyn Pilar (FABER) test: negative, Ober's test: negative, and Piriformis test: negative Knee special tests: Anterior drawer test: negative, Lachman Test: negative, and McMurray's test: negative  FUNCTIONAL TESTS:  TBD  GAIT: Distance walked: in clinic Assistive device utilized: None Level of assistance: Complete Independence Comments: B knee varus noted in standing posture/ gait.  Slight L knee lateral shift with wt. Bearing.     TODAY'S TREATMENT:                                                                                                                               DATE: 08/15/22  Subjective:  Pt. States L medial knee pain goes up to a 2.5/10 during Scifit activity.  Pt. Continues to remain active with gym based ex./ Yoga.  Pt. Has no questions with ex.   There.ex.:  Scifit L9 10 min. B LE.  2/10 pain that resolved after terminating activity.    Lateral step ups on 6" step with added Airex in //-bars. 10x2.    BOSU squats with mirror feedback and excellent technique.  10x2.    Nautilus: resisted gait 125# 5x all 4-planes.  No UE assist required.  SBA from SPT for safety/ cuing.   R LE strength grossly 5/5 MMT except hip abduction 4+/5 MMT.   L LE strength grossly 5/5 MMT except hip abduction/ ER 4+/5 MMT.   Reassessment of goals/ lifting technique.   Reviewed HEP/ Neurosurgeon program.   PATIENT EDUCATION:  Education details: Gym ex./ [employer] Person educated: Patient Education method: Explanation Education comprehension: verbalized understanding  HOME EXERCISE PROGRAM: Will issue next tx.  ASSESSMENT:  CLINICAL IMPRESSION: Pt. Works hard during tx. Session and is highly motivated to participate with gym based ex.  Pt. Reports minimal knee discomfort during there.ex. and understands current [employer] ex. Pt. Maintained low pain throughout treatment.  Pt. Presents with knee varus/ lateral shift in knee with gait and pt. Has generalized muscle weakness in hips and knee/ankle static stabilizers. Pt. Experienced no LOB during today's treatment. PT discussed the [employer] gym based exercise program that PT issued to pt. After previous therapy visit. Pt. States no increase in pain with his independent gym based exercises and was educated on focusing on correct form and avoiding over/under loading by avoiding an increase in pain and progressing strength gains via small wt. increments. Discharge from PT at this time.  Pt. Instructed to contact PT if any questions or concerns.     OBJECTIVE IMPAIRMENTS: Abnormal gait, decreased activity tolerance, decreased balance, decreased endurance, decreased mobility, difficulty walking, decreased ROM, decreased strength, improper body mechanics, postural dysfunction, and pain.   ACTIVITY LIMITATIONS: carrying, lifting, bending, sitting, standing, transfers, and locomotion level  PARTICIPATION LIMITATIONS:  driving, community activity, and occupation  PERSONAL FACTORS: Fitness and Past/current experiences are also affecting patient's functional outcome.   REHAB POTENTIAL: Good  CLINICAL DECISION MAKING: Stable/uncomplicated  EVALUATION COMPLEXITY: Low   GOALS: Goals reviewed with patient? Yes  SHORT TERM GOALS: Target date: 08/08/22 Pt. Independent with HEP to increase B hip/LE strength 1/2 muscle grade to improve pain-free mobility.  Baseline:  see above Goal status: Goal met   LONG TERM GOALS: Target date: 09/05/22  Pt. Will increase FOTO to 63 to improve pain-free mobility.   Baseline:  initial 54.  1/31: 63 Goal status: Goal met  2.  Pt. Will demontrate proper lifting/ carrying technique with no increase c/o back/L knee pain.   Baseline:  increase pain with lifting. Goal status: Goal met  3.  Pt. Will participate with consistent gym based ex. At [employer] with no increase c/o knee/low back symptoms.   Baseline:  Goal status: Goal met  PLAN:  PT FREQUENCY: 1-2x/week  PT DURATION: 8 weeks  PLANNED INTERVENTIONS: Therapeutic exercises, Therapeutic activity, Neuromuscular re-education, Balance training, Gait training, Patient/Family education, Self Care, Joint mobilization, Electrical stimulation, Cryotherapy, Moist heat, and Manual therapy  PLAN FOR NEXT SESSION: Discharge visit.   Ashlyn B. Rogers Blocker, SPT Pura Spice, PT, DPT # [phone] 08/15/2022, 8:58 PM

## 2022-08-16 DIAGNOSIS — D2272 Melanocytic nevi of left lower limb, including hip: Secondary | ICD-10-CM | POA: Diagnosis not present

## 2022-08-16 DIAGNOSIS — D225 Melanocytic nevi of trunk: Secondary | ICD-10-CM | POA: Diagnosis not present

## 2022-08-16 DIAGNOSIS — L570 Actinic keratosis: Secondary | ICD-10-CM | POA: Diagnosis not present

## 2022-08-16 DIAGNOSIS — D485 Neoplasm of uncertain behavior of skin: Secondary | ICD-10-CM | POA: Diagnosis not present

## 2022-08-16 DIAGNOSIS — L853 Xerosis cutis: Secondary | ICD-10-CM | POA: Diagnosis not present

## 2022-08-16 DIAGNOSIS — L578 Other skin changes due to chronic exposure to nonionizing radiation: Secondary | ICD-10-CM | POA: Diagnosis not present

## 2022-08-31 DIAGNOSIS — Z7689 Persons encountering health services in other specified circumstances: Secondary | ICD-10-CM | POA: Diagnosis not present

## 2022-09-17 DIAGNOSIS — L988 Other specified disorders of the skin and subcutaneous tissue: Secondary | ICD-10-CM | POA: Diagnosis not present

## 2022-09-17 DIAGNOSIS — D485 Neoplasm of uncertain behavior of skin: Secondary | ICD-10-CM | POA: Diagnosis not present

## 2022-09-20 ENCOUNTER — Ambulatory Visit: Payer: BC Managed Care – PPO | Attending: Cardiology | Admitting: Cardiology

## 2022-09-20 ENCOUNTER — Encounter: Payer: Self-pay | Admitting: Cardiology

## 2022-09-20 VITALS — BP 130/80 | HR 78 | Ht 70.0 in | Wt 194.0 lb

## 2022-09-20 DIAGNOSIS — E7800 Pure hypercholesterolemia, unspecified: Secondary | ICD-10-CM | POA: Diagnosis not present

## 2022-09-20 DIAGNOSIS — I10 Essential (primary) hypertension: Secondary | ICD-10-CM | POA: Diagnosis not present

## 2022-09-20 DIAGNOSIS — I2510 Atherosclerotic heart disease of native coronary artery without angina pectoris: Secondary | ICD-10-CM

## 2022-09-20 NOTE — Progress Notes (Signed)
Cardiology Office Note:    Date:  09/20/2022   ID:  Daniel Lawrence, DOB 1961-05-22, MRN [id number]  PCP:  [name], MD   [place] Providers Cardiologist:  Kate Sable, MD     Referring MD: [name], MD   Chief Complaint  Patient presents with   Follow-up    8-10 week f/u, no new cardiac concerns     History of Present Illness:    Daniel Lawrence is a 62 y.o. male with a hx of hypertension, hyperlipidemia who presents for follow-up.  Previously seen due to chest pain.    Echocardiogram and coronary CT was obtained to evaluate cardiac etiology.  He takes all medications as prescribed.  Presents for cardiac testing results, denies any new problems.  Denies any recurrence of chest pain.  Prior notes  father had coronary stent placement in his 67s  Past Medical History:  Diagnosis Date   Allergy    seasonal   Arthritis    left knee   Cancer ([place])    skin cancer   Cataract    Hyperlipidemia    Hypertension     Past Surgical History:  Procedure Laterality Date   BRAIN SURGERY  1971   COLONOSCOPY  05/03/2021   ELEVATION OF DEPRESSED SKULL FRACTURE  07/16/1969   traumatic blow to head by golf club   , [facility])   MOHS SURGERY  09/14/2011   left temple, squamous   ROBOT ASSISTED LAPAROSCOPIC NEPHRECTOMY Right 04/20/2016   Procedure: XI ROBOTIC [place];  Surgeon: Alexis Frock, MD;  Location: [facility];  Service: Urology;  Laterality: Right;    Current Medications: Current Meds  Medication Sig   aspirin EC 81 MG tablet Take 81 mg by mouth daily. Swallow whole.   Cholecalciferol (VITAMIN D3) 125 MCG (5000 UT) CAPS Take 1 capsule by mouth daily.   Coenzyme Q10 100 MG capsule Take 100 mg by mouth daily.   loratadine (CLARITIN) 10 MG tablet Take 10 mg by mouth daily as needed for allergies.    losartan (COZAAR) 50 MG tablet TAKE 1 TABLET BY MOUTH EVERY DAY   Multiple Vitamins-Minerals (CENTRUM SILVER  50+MEN) TABS    Plant Sterol Stanol-Pantethine (CHOLESTOFF COMPLETE) 300-100 MG CAPS    rosuvastatin (CRESTOR) 20 MG tablet Take 1 tablet (20 mg total) by mouth daily.   tadalafil (CIALIS) 5 MG tablet Take 1 tablet (5 mg total) by mouth daily.   TART CHERRY PO Take 1 capsule by mouth daily. 3000 units daily.   [DISCONTINUED] metoprolol tartrate (LOPRESSOR) 100 MG tablet Take one tablet ('100mg'$ ) two hours prior to Cardiac CTA     Allergies:   Sulfa antibiotics   Social History   Socioeconomic History   Marital status: Married    Spouse name: Not on file   Number of children: Not on file   Years of education: Not on file   Highest education level: Not on file  Occupational History   Not on file  Tobacco Use   Smoking status: Never    Passive exposure: Past   Smokeless tobacco: Never  Vaping Use   Vaping Use: Never used  Substance and Sexual Activity   Alcohol use: Yes    Alcohol/week: 4.0 standard drinks of alcohol    Types: 2 Glasses of wine, 2 Cans of beer per week    Comment: 2 glasses wine/2 beers per week   Drug use: Never   Sexual activity: Not on file  Other Topics Concern   Not on file  Social History Narrative   Not on file   Social Determinants of Health   Financial Resource Strain: Not on file  Food Insecurity: No Food Insecurity (06/01/2022)   Hunger Vital Sign    Worried About Running Out of Food in the Last Year: Never true    Ran Out of Food in the Last Year: Never true  Transportation Needs: No Transportation Needs (06/01/2022)   PRAPARE - Hydrologist (Medical): No    Lack of Transportation (Non-Medical): No  Physical Activity: Not on file  Stress: Not on file  Social Connections: Not on file     Family History: The patient's family history includes Alzheimer's disease in his maternal aunt, maternal uncle, and mother; Cancer in his maternal grandfather and maternal grandmother; Heart disease in his father. There is no  history of Colon cancer, Esophageal cancer, Stomach cancer, Colon polyps, or Rectal cancer.  ROS:   Please see the history of present illness.     All other systems reviewed and are negative.  EKGs/Labs/Other Studies Reviewed:    The following studies were reviewed today:  EKG:  EKG not  ordered today.   Recent Labs: 07/02/2022: ALT 21; BUN 14; Creatinine, Ser 1.42; Hemoglobin 15.9; Platelets 102.0 Repeated and verified X2.; Potassium 4.2; Sodium 136  Recent Lipid Panel    Component Value Date/Time   CHOL 138 07/02/2022 0855   TRIG 141.0 07/02/2022 0855   HDL 48.70 07/02/2022 0855   CHOLHDL 3 07/02/2022 0855   VLDL 28.2 07/02/2022 0855   LDLCALC 61 07/02/2022 0855   LDLDIRECT 154.9 12/07/2011 0939     Risk Assessment/Calculations:              Physical Exam:    VS:  BP 130/80 (BP Location: Left Arm, Patient Position: Sitting, Cuff Size: Normal)   Pulse 78   Ht '5\' 10"'$  (1.778 m)   Wt 194 lb (88 kg)   SpO2 99%   BMI 27.84 kg/m     Wt Readings from Last 3 Encounters:  09/20/22 194 lb (88 kg)  07/18/22 194 lb (88 kg)  07/12/22 192 lb (87.1 kg)     GEN:  Well nourished, well developed in no acute distress HEENT: Normal NECK: No JVD; No carotid bruits CARDIAC: RRR, no murmurs, rubs, gallops RESPIRATORY:  Clear to auscultation without rales, wheezing or rhonchi  ABDOMEN: Soft, non-tender, non-distended MUSCULOSKELETAL:  No edema; No deformity  SKIN: Warm and dry NEUROLOGIC:  Alert and oriented x 3 PSYCHIATRIC:  Normal affect   ASSESSMENT:    1. Coronary artery disease involving native coronary artery of native heart, unspecified whether angina present   2. Primary hypertension   3. Pure hypercholesterolemia    PLAN:    In order of problems listed above:  Minimal nonobstructive LAD and RCA stenosis less than 25%.  Calcium score 320.  Echo with normal function EF 60 to 65%.  Aspirin 81 mg, Crestor 20 mg daily, LDL at goal.. Hypertension, BP controlled.   Continue losartan 50 mg daily. Hyperlipidemia, cholesterol controlled.  Continue Crestor 20 mg daily.  Follow-up in 6 months.      Medication Adjustments/Labs and Tests Ordered: Current medicines are reviewed at length with the patient today.  Concerns regarding medicines are outlined above.  No orders of the defined types were placed in this encounter.  No orders of the defined types were placed in this encounter.   Patient  Instructions  Medication Instructions:   Your physician recommends that you continue on your current medications as directed. Please refer to the Current Medication list given to you today.  *If you need a refill on your cardiac medications before your next appointment, please call your pharmacy*   Lab Work:  None Ordered  If you have labs (blood work) drawn today and your tests are completely normal, you will receive your results only by: [place] (if you have MyChart) OR A paper copy in the mail If you have any lab test that is abnormal or we need to change your treatment, we will call you to review the results.   Testing/Procedures:  None Ordered   Follow-Up: At [facility], you and your health needs are our priority.  As part of our continuing mission to provide you with exceptional heart care, we have created designated Provider Care Teams.  These Care Teams include your primary Cardiologist (physician) and Advanced Practice Providers (APPs -  Physician Assistants and Nurse Practitioners) who all work together to provide you with the care you need, when you need it.  We recommend signing up for the patient portal called "MyChart".  Sign up information is provided on this After Visit Summary.  MyChart is used to connect with patients for Virtual Visits (Telemedicine).  Patients are able to view lab/test results, encounter notes, upcoming appointments, etc.  Non-urgent messages can be sent to your provider as well.   To learn more  about what you can do with MyChart, go to [email or web address].    Your next appointment:   6 month(s)  Provider:   You may see Kate Sable, MD or one of the following Advanced Practice Providers on your designated Care Team:   Murray Hodgkins, NP Christell Faith, PA-C Cadence Kathlen Mody, PA-C Gerrie Nordmann, NP   Signed, Kate Sable, MD  09/20/2022 9:39 AM    [place]

## 2022-09-20 NOTE — Patient Instructions (Signed)
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
## 2022-09-25 ENCOUNTER — Ambulatory Visit (INDEPENDENT_AMBULATORY_CARE_PROVIDER_SITE_OTHER): Payer: BC Managed Care – PPO | Admitting: Family Medicine

## 2022-09-25 VITALS — BP 128/74 | HR 69 | Ht 70.0 in | Wt 192.0 lb

## 2022-09-25 DIAGNOSIS — M1712 Unilateral primary osteoarthritis, left knee: Secondary | ICD-10-CM

## 2022-09-25 DIAGNOSIS — G8929 Other chronic pain: Secondary | ICD-10-CM

## 2022-09-25 DIAGNOSIS — M5441 Lumbago with sciatica, right side: Secondary | ICD-10-CM

## 2022-09-25 NOTE — Patient Instructions (Addendum)
$'50mg'X$   DHEA for 4 weeks then 2 weeks off Work on [employer] for core strength Keep working on [employer] strength for knee See you again in 3 months

## 2022-09-25 NOTE — Assessment & Plan Note (Signed)
Low back pain, discussed with patient icing regimen and home exercises again.  At this moment patient is doing relatively well and we can continue to monitor.  Multiple tricks also can do further evaluation if needed.  Patient at this point feels like he has made significant improvement with the conservative therapy.  Also working on core strength with the isometrics.  Follow-up again in 3 months

## 2022-09-25 NOTE — Assessment & Plan Note (Signed)
Patient has been doing this for quite some time.  Discussed with patient again about icing regimen and home exercises, patient still wants to avoid any type of surgical intervention.  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  Continue to work on [employer].  Follow-up with me again in 12 weeks

## 2022-10-12 DIAGNOSIS — E663 Overweight: Secondary | ICD-10-CM | POA: Diagnosis not present

## 2022-10-24 DIAGNOSIS — Z7689 Persons encountering health services in other specified circumstances: Secondary | ICD-10-CM | POA: Diagnosis not present

## 2022-11-16 ENCOUNTER — Other Ambulatory Visit (INDEPENDENT_AMBULATORY_CARE_PROVIDER_SITE_OTHER): Payer: BC Managed Care – PPO

## 2022-11-16 DIAGNOSIS — D696 Thrombocytopenia, unspecified: Secondary | ICD-10-CM | POA: Diagnosis not present

## 2022-11-16 LAB — CBC WITH DIFFERENTIAL/PLATELET
Basophils Absolute: 0 10*3/uL (ref 0.0–0.1)
Basophils Relative: 0.9 % (ref 0.0–3.0)
Eosinophils Absolute: 0.1 10*3/uL (ref 0.0–0.7)
Eosinophils Relative: 3.3 % (ref 0.0–5.0)
HCT: 47.5 % (ref 39.0–52.0)
Hemoglobin: 16 g/dL (ref 13.0–17.0)
Lymphocytes Relative: 22.6 % (ref 12.0–46.0)
Lymphs Abs: 1 10*3/uL (ref 0.7–4.0)
MCHC: 33.8 g/dL (ref 30.0–36.0)
MCV: 90.8 fl (ref 78.0–100.0)
Monocytes Absolute: 0.5 10*3/uL (ref 0.1–1.0)
Monocytes Relative: 12.1 % — ABNORMAL HIGH (ref 3.0–12.0)
Neutro Abs: 2.7 10*3/uL (ref 1.4–7.7)
Neutrophils Relative %: 61.1 % (ref 43.0–77.0)
Platelets: 124 10*3/uL — ABNORMAL LOW (ref 150.0–400.0)
RBC: 5.23 Mil/uL (ref 4.22–5.81)
RDW: 13 % (ref 11.5–15.5)
WBC: 4.4 10*3/uL (ref 4.0–10.5)

## 2022-11-19 DIAGNOSIS — N183 Chronic kidney disease, stage 3 unspecified: Secondary | ICD-10-CM | POA: Diagnosis not present

## 2022-11-19 DIAGNOSIS — I129 Hypertensive chronic kidney disease with stage 1 through stage 4 chronic kidney disease, or unspecified chronic kidney disease: Secondary | ICD-10-CM | POA: Diagnosis not present

## 2022-11-19 LAB — BASIC METABOLIC PANEL
BUN: 14 (ref 4–21)
Creatinine: 1.3 (ref 0.6–1.3)
Glucose: 86
Potassium: 4.3 mEq/L (ref 3.5–5.1)
Sodium: 141 (ref 137–147)

## 2022-11-20 ENCOUNTER — Encounter: Payer: Self-pay | Admitting: Internal Medicine

## 2022-11-20 ENCOUNTER — Ambulatory Visit (INDEPENDENT_AMBULATORY_CARE_PROVIDER_SITE_OTHER): Payer: BC Managed Care – PPO | Admitting: Internal Medicine

## 2022-11-20 VITALS — BP 118/78 | HR 71 | Temp 98.3°F | Ht 70.0 in | Wt 190.2 lb

## 2022-11-20 DIAGNOSIS — I2583 Coronary atherosclerosis due to lipid rich plaque: Secondary | ICD-10-CM

## 2022-11-20 DIAGNOSIS — I700 Atherosclerosis of aorta: Secondary | ICD-10-CM

## 2022-11-20 DIAGNOSIS — Z85528 Personal history of other malignant neoplasm of kidney: Secondary | ICD-10-CM | POA: Diagnosis not present

## 2022-11-20 DIAGNOSIS — N1831 Chronic kidney disease, stage 3a: Secondary | ICD-10-CM

## 2022-11-20 DIAGNOSIS — I2510 Atherosclerotic heart disease of native coronary artery without angina pectoris: Secondary | ICD-10-CM | POA: Diagnosis not present

## 2022-11-20 DIAGNOSIS — G8929 Other chronic pain: Secondary | ICD-10-CM

## 2022-11-20 DIAGNOSIS — I10 Essential (primary) hypertension: Secondary | ICD-10-CM

## 2022-11-20 DIAGNOSIS — R972 Elevated prostate specific antigen [PSA]: Secondary | ICD-10-CM

## 2022-11-20 DIAGNOSIS — M5441 Lumbago with sciatica, right side: Secondary | ICD-10-CM

## 2022-11-20 LAB — HEPATIC FUNCTION PANEL
ALT: 25 U/L (ref 0–53)
AST: 26 U/L (ref 0–37)
Albumin: 4.5 g/dL (ref 3.5–5.2)
Alkaline Phosphatase: 56 U/L (ref 39–117)
Bilirubin, Direct: 0.1 mg/dL (ref 0.0–0.3)
Total Bilirubin: 0.7 mg/dL (ref 0.2–1.2)
Total Protein: 7.6 g/dL (ref 6.0–8.3)

## 2022-11-20 LAB — MICROALBUMIN / CREATININE URINE RATIO
Creatinine,U: 50.2 mg/dL
Microalb Creat Ratio: 1.4 mg/g (ref 0.0–30.0)
Microalb, Ur: <0.7 mg/dL (ref 0.0–1.9)

## 2022-11-20 LAB — LAB REPORT - SCANNED: EGFR: 60

## 2022-11-20 MED ORDER — LOSARTAN POTASSIUM 50 MG PO TABS: 50.0000 mg | ORAL_TABLET | Freq: Every day | ORAL | 1 refills | Status: DC

## 2022-11-20 MED ORDER — ROSUVASTATIN CALCIUM 20 MG PO TABS: 20.0000 mg | ORAL_TABLET | Freq: Every day | ORAL | 1 refills | Status: DC

## 2022-11-20 MED ORDER — PREDNISONE 10 MG PO TABS: ORAL_TABLET | ORAL | 0 refills | Status: DC

## 2022-11-20 NOTE — Assessment & Plan Note (Signed)
Well controlled on current regimen of losartan 50 mg daily . Renal function is slightly low but stable, no changes today.  Lab Results  Component Value Date   CREATININE 1.3 11/19/2022   Lab Results  Component Value Date   NA 141 11/19/2022   K 4.3 11/19/2022   CL 100 07/02/2022   CO2 29 07/02/2022

## 2022-11-20 NOTE — Assessment & Plan Note (Addendum)
Advised to follow up with urology   Lab Results  Component Value Date   PSA 0.88 07/02/2022   PSA 0.33 04/15/2020   PSA 1.1 04/13/2019

## 2022-11-20 NOTE — Assessment & Plan Note (Addendum)
Recurrent mild flares treated with gabapentin.  Prednisone taper given for next occurrence that occurs with sciatica

## 2022-11-20 NOTE — Assessment & Plan Note (Addendum)
Asymptomatic, Noted on imaging films.   He is tolerating statin therapy, and iLDL is < 70   Lab Results  Component Value Date   CHOL 138 07/02/2022   HDL 48.70 07/02/2022   LDLCALC 61 07/02/2022   LDLDIRECT 154.9 12/07/2011   TRIG 141.0 07/02/2022   CHOLHDL 3 07/02/2022

## 2022-11-20 NOTE — Assessment & Plan Note (Addendum)
He has had no recurrence by repeat imaging  . Current urinalysis is negative  for RBC's will continue biannual surveillance with UA

## 2022-11-20 NOTE — Assessment & Plan Note (Signed)
Reviewed findings of prior CT scan today..  Patient is  tolerating 20 mg crestor

## 2022-11-20 NOTE — Patient Instructions (Addendum)
I have prescribed a prednisone course for you to fill and KEEP ON HAND IF YOU HAVE A SEVERE SCIATICA FLARE  PRIOR TO YOUR NEXT APPT   60 MG DAILY FOR 3 DAYS,  THEN BEGIN THE TAPER .

## 2022-11-20 NOTE — Assessment & Plan Note (Signed)
Improved by May 2024 outside labds.    Avoiding NSAIDs.  Continue losartan  Lab Results  Component Value Date   NA 141 11/19/2022   K 4.3 11/19/2022   CL 100 07/02/2022   CO2 29 07/02/2022   Lab Results  Component Value Date   CREATININE 1.3 11/19/2022   Lab Results  Component Value Date   MICROALBUR 15.4 (H) 06/23/2014

## 2022-11-20 NOTE — Progress Notes (Signed)
Subjective:  Patient ID: Daniel Lawrence, male    DOB: 04-Sep-1960  Age: 62 y.o. MRN: [id number]  CC: The primary encounter diagnosis was Stage 3a chronic kidney disease (HCC). Diagnoses of History of renal cell carcinoma, Coronary atherosclerosis due to lipid rich plaque, Primary hypertension, Chronic bilateral low back pain with right-sided sciatica, Aortic atherosclerosis (HCC), and Increased prostate specific antigen (PSA) velocity were also pertinent to this visit.   HPI DAVINE SANANDRES presents for  Chief Complaint  Patient presents with   Medical Management of Chronic Issues    1) CKD:  reviewed recent Cr which has improved.  He is still seeing nephrology 1-2 times per year, wonders if it is necessary   2) h/o renal cell CA s/p right nephrectomy in 2017  3) HTN: Hypertension: patient checks blood pressure twice weekly at home.  Readings have been for the most part <130/80 at rest . Patient is following a reduce salt diet most days and is taking medications as prescribed   4) CAD: reviewed  coronary calcium CT Jan 2024: minimal CAD  .  Score 320 . Tolerating crestor.  No exertional pain,  5) low back pain has been lately more problematic since he has been working on his  barn, hanging off of a ladder . Has been Using TENS which helps .  Right thigh spasms  at night,  using gabapentin 300 mg qhs.  Reviewed prior MRI lumbar spine noting multiple level degenerative changes.  Doing /teaching yoga,  avoiding superman pose ,  working out at [employer] to strengthen core  6) chronic left knee pain: Seeing Zach for left knee pain which has improved.     Outpatient Medications Prior to Visit  Medication Sig Dispense Refill   aspirin EC 81 MG tablet Take 81 mg by mouth daily. Swallow whole.     Cholecalciferol (VITAMIN D3) 125 MCG (5000 UT) CAPS Take 1 capsule by mouth daily.     Coenzyme Q10 100 MG capsule Take 100 mg by mouth daily.     gabapentin (NEURONTIN) 300 MG capsule TAKE  1 CAPSULE BY MOUTH EVERYDAY AT BEDTIME 90 capsule 1   loratadine (CLARITIN) 10 MG tablet Take 10 mg by mouth daily as needed for allergies.      Multiple Vitamins-Minerals (CENTRUM SILVER 50+MEN) TABS      Plant Sterol Stanol-Pantethine (CHOLESTOFF COMPLETE) 300-100 MG CAPS      tadalafil (CIALIS) 5 MG tablet Take 1 tablet (5 mg total) by mouth daily. 30 tablet 11   TART CHERRY PO Take 1 capsule by mouth daily. 3000 units daily.     losartan (COZAAR) 50 MG tablet TAKE 1 TABLET BY MOUTH EVERY DAY 90 tablet 1   rosuvastatin (CRESTOR) 20 MG tablet Take 1 tablet (20 mg total) by mouth daily. 90 tablet 1   No facility-administered medications prior to visit.    Review of Systems;  Patient denies headache, fevers, malaise, unintentional weight loss, skin rash, eye pain, sinus congestion and sinus pain, sore throat, dysphagia,  hemoptysis , cough, dyspnea, wheezing, chest pain, palpitations, orthopnea, edema, abdominal pain, nausea, melena, diarrhea, constipation, flank pain, dysuria, hematuria, urinary  Frequency, nocturia, numbness, tingling, seizures,  Focal weakness, Loss of consciousness,  Tremor, insomnia, depression, anxiety, and suicidal ideation.      Objective:  BP 118/78   Pulse 71   Temp 98.3 F (36.8 C) (Oral)   Ht 5\' 10"  (1.778 m)   Wt 190 lb 3.2 oz (86.3 kg)  SpO2 97%   BMI 27.29 kg/m   BP Readings from Last 3 Encounters:  11/20/22 118/78  09/25/22 128/74  09/20/22 130/80    Wt Readings from Last 3 Encounters:  11/20/22 190 lb 3.2 oz (86.3 kg)  09/25/22 192 lb (87.1 kg)  09/20/22 194 lb (88 kg)    Physical Exam Vitals reviewed.  Constitutional:      General: He is not in acute distress.    Appearance: Normal appearance. He is normal weight. He is not ill-appearing, toxic-appearing or diaphoretic.  HENT:     Head: Normocephalic.  Eyes:     General: No scleral icterus.       Right eye: No discharge.        Left eye: No discharge.     Conjunctiva/sclera:  Conjunctivae normal.  Cardiovascular:     Rate and Rhythm: Normal rate and regular rhythm.     Heart sounds: Normal heart sounds.  Pulmonary:     Effort: Pulmonary effort is normal. No respiratory distress.     Breath sounds: Normal breath sounds.  Musculoskeletal:        General: Normal range of motion.       Arms:     Cervical back: Normal range of motion.     Comments: Paraspinus muscle spasm   Skin:    General: Skin is warm and dry.  Neurological:     General: No focal deficit present.     Mental Status: He is alert and oriented to person, place, and time. Mental status is at baseline.  Psychiatric:        Mood and Affect: Mood normal.        Behavior: Behavior normal.        Thought Content: Thought content normal.        Judgment: Judgment normal.    Lab Results  Component Value Date   HGBA1C 5.3 04/15/2020   HGBA1C 5.2 04/05/2016    Lab Results  Component Value Date   CREATININE 1.3 11/19/2022   CREATININE 1.42 07/02/2022   CREATININE 1.5 (A) 11/14/2021    Lab Results  Component Value Date   WBC 4.4 11/16/2022   HGB 16.0 11/16/2022   HCT 47.5 11/16/2022   PLT 124.0 (L) 11/16/2022   GLUCOSE 94 07/02/2022   CHOL 138 07/02/2022   TRIG 141.0 07/02/2022   HDL 48.70 07/02/2022   LDLDIRECT 154.9 12/07/2011   LDLCALC 61 07/02/2022   ALT 25 11/20/2022   AST 26 11/20/2022   NA 141 11/19/2022   K 4.3 11/19/2022   CL 100 07/02/2022   CREATININE 1.3 11/19/2022   BUN 14 11/19/2022   CO2 29 07/02/2022   TSH 2.48 04/13/2019   PSA 0.88 07/02/2022   INR 1.0 06/23/2014   HGBA1C 5.3 04/15/2020   MICROALBUR <0.7 11/20/2022    CT CORONARY MORPH W/CTA COR W/SCORE W/CA W/CM &/OR WO/CM  Addendum Date: 08/03/2022   ADDENDUM REPORT: 08/03/2022 11:19 EXAM: OVER-READ INTERPRETATION  CT CHEST The following report is an over-read performed by radiologist Dr. Karle Barr [facility] on 08/03/2022. This over-read does not include interpretation of cardiac or  coronary anatomy or pathology. The coronary CTA interpretation by the cardiologist is attached. COMPARISON:  04/19/2017 FINDINGS: Aorta normal caliber. No pericardial effusion. Central pulmonary arteries grossly unremarkable. Esophagus normal appearance. No adenopathy. Visualized upper abdomen normal. Small linear scar RIGHT middle lobe image 21 unchanged. Minimal dependent density RIGHT lower lobe unchanged. Lungs otherwise clear. Vertebral hemangioma within a mid to  lower thoracic vertebra. IMPRESSION: No significant extracardiac abnormalities. Electronically Signed   By: Ulyses Southward M.D.   On: 08/03/2022 11:19   Result Date: 08/03/2022 CLINICAL DATA:  Chest pain EXAM: Cardiac/Coronary  CTA TECHNIQUE: The patient was scanned on a Siemens Somatom go.Top scanner. : A retrospective scan was triggered in the ascending thoracic aorta. Axial non-contrast 3 mm slices were carried out through the heart. The data set was analyzed on a dedicated work station and scored using the Agatson method. Gantry rotation speed was 330 msecs and collimation was .6 mm. 10mg  of metoprolol iv and 0.8 mg of sl NTG was given. The 3D data set was reconstructed in 5% intervals of the 60-95 % of the R-R cycle. Diastolic phases were analyzed on a dedicated work station using MPR, MIP and VRT modes. The patient received 75 cc of contrast. FINDINGS: Aorta:  Normal size.  No calcifications.  No dissection. Aortic Valve:  Trileaflet.  No calcifications. Coronary Arteries:  Normal coronary origin.  Right dominance. RCA is a dominant artery that gives rise to PDA and PLA. There is calcified plaque in the mid segment causing minimal stenosis (<25%). Left main is a large artery that gives rise to LAD and LCX arteries. LAD has calcified plaque in the proximal LAD causing minimal stenosis (<25%). LCX is a non-dominant artery that gives rise to one large OM1 branch. There is no plaque. Other findings: Normal pulmonary vein drainage into the left atrium.  Normal left atrial appendage without a thrombus. Normal size of the pulmonary artery. IMPRESSION: 1. Coronary calcium score of 320. This was 80th percentile for age and sex matched control. 2. Normal coronary origin with right dominance. 3. Minimal LAD and RCA stenosis (<25%). 4. CAD-RADS 1. Minimal non-obstructive CAD (0-24%). Consider non-atherosclerotic causes of chest pain. Consider preventive therapy and risk factor modification. Electronically Signed: By: Debbe Odea M.D. On: 08/02/2022 17:41    Assessment & Plan:  .Stage 3a chronic kidney disease (HCC) Assessment & Plan: Improved by May 2024 outside labds.    Avoiding NSAIDs.  Continue losartan  Lab Results  Component Value Date   NA 141 11/19/2022   K 4.3 11/19/2022   CL 100 07/02/2022   CO2 29 07/02/2022   Lab Results  Component Value Date   CREATININE 1.3 11/19/2022   Lab Results  Component Value Date   MICROALBUR 15.4 (H) 06/23/2014        Orders: -     Comprehensive metabolic panel; Future  History of renal cell carcinoma Assessment & Plan: He has had no recurrence by repeat imaging  . Current urinalysis is negative  for RBC's will continue biannual surveillance with UA   Orders: -     Urinalysis, Routine w reflex microscopic; Future  Coronary atherosclerosis due to lipid rich plaque Assessment & Plan: Asymptomatic, Noted on imaging films.   He is tolerating statin therapy, and iLDL is < 70   Lab Results  Component Value Date   CHOL 138 07/02/2022   HDL 48.70 07/02/2022   LDLCALC 61 07/02/2022   LDLDIRECT 154.9 12/07/2011   TRIG 141.0 07/02/2022   CHOLHDL 3 07/02/2022     Orders: -     Hepatic function panel -     Lipid Panel w/reflex Direct LDL; Future  Primary hypertension Assessment & Plan: Well controlled on current regimen of losartan 50 mg daily . Renal function is slightly low but stable, no changes today.  Lab Results  Component Value Date   CREATININE  1.3 11/19/2022   Lab  Results  Component Value Date   NA 141 11/19/2022   K 4.3 11/19/2022   CL 100 07/02/2022   CO2 29 07/02/2022     Orders: -     Microalbumin / creatinine urine ratio  Chronic bilateral low back pain with right-sided sciatica Assessment & Plan: Recurrent mild flares treated with gabapentin.  Prednisone taper given for next occurrence that occurs with sciatica    Aortic atherosclerosis [facility]) Assessment & Plan: Reviewed findings of prior CT scan today..  Patient is  tolerating 20 mg crestor    Increased prostate specific antigen (PSA) velocity Assessment & Plan: Advised to follow up with urology   Lab Results  Component Value Date   PSA 0.88 07/02/2022   PSA 0.33 04/15/2020   PSA 1.1 04/13/2019       Other orders -     Losartan Potassium; Take 1 tablet (50 mg total) by mouth daily.  Dispense: 90 tablet; Refill: 1 -     Rosuvastatin Calcium; Take 1 tablet (20 mg total) by mouth daily.  Dispense: 90 tablet; Refill: 1 -     predniSONE; 6 tablets daily for 3 days, then reduce by 1 tablet daily until gone  Dispense: 33 tablet; Refill: 0     I provided 42  minutes of face-to-face time during this encounter reviewing patient's last visit with me, patient's  most recent visit with cardiology,  nephrology,  and sports medicine, previous  surgical and non surgical procedures, previous  labs and lumbar MRI  counseling on currently addressed issues,  and post visit ordering to diagnostics and therapeutics .   Follow-up: Return in about 6 months (around 05/23/2023).   Sherlene Shams, MD

## 2022-12-25 NOTE — Progress Notes (Unsigned)
[facility] [address] [place] [address] Phone: [phone] Subjective:   Daniel Lawrence, am serving as a scribe for Dr. Antoine Primas.  I'm seeing this patient by the request  of:  Sherlene Shams, MD  CC: Left knee pain, back pain, shoulder pain  [id number]  09/25/2022 Low back pain, discussed with patient icing regimen and home exercises again.  At this moment patient is doing relatively well and we can continue to monitor.  Multiple tricks also can do further evaluation if needed.  Patient at this point feels like he has made significant improvement with the conservative therapy.  Also working on core strength with the isometrics.  Follow-up again in 3 months     Patient has been doing this for quite some time.  Discussed with patient again about icing regimen and home exercises, patient still wants to avoid any type of surgical intervention.  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  Follow-up with me again in 12 weeks      Update 12/26/2022 Daniel Lawrence is a 62 y.o. male coming in with complaint of L knee and LBP. Patient states that his back pain is manageable with movement. Continues to have L knee pain but it is not impeding any of his activities.   C/o R shoulder popping after wringing out a washrag last Saturday. Was able to move shoulder in full range following popping sensation. Very little pain but more soreness.   Also wants to know diet restrictions to limit inflammation.    Past Medical History:  Diagnosis Date   Allergy    seasonal   Arthritis    left knee   Cancer (HCC)    skin cancer   Cataract    Hyperlipidemia    Hypertension    Past Surgical History:  Procedure Laterality Date   BRAIN SURGERY  1971   COLONOSCOPY  05/03/2021   ELEVATION OF DEPRESSED SKULL FRACTURE  07/16/1969   traumatic blow to head by golf club   , [facility])    MOHS SURGERY  09/14/2011   left temple, squamous   ROBOT ASSISTED LAPAROSCOPIC NEPHRECTOMY Right 04/20/2016   Procedure: XI ROBOTIC ASSISTED LAPAROSCOPIC RADICAL NEPHRECTOMY;  Surgeon: Sebastian Ache, MD;  Location: [facility];  Service: Urology;  Laterality: Right;   Social History   Socioeconomic History   Marital status: Married    Spouse name: Not on file   Number of children: Not on file   Years of education: Not on file   Highest education level: Some college, no degree  Occupational History   Not on file  Tobacco Use   Smoking status: Never    Passive exposure: Past   Smokeless tobacco: Never  Vaping Use   Vaping Use: Never used  Substance and Sexual Activity   Alcohol use: Yes    Alcohol/week: 4.0 standard drinks of alcohol    Types: 2 Glasses of wine, 2 Cans of beer per week    Comment: 2 glasses wine/2 beers per week   Drug use: Never   Sexual activity: Not on file  Other Topics Concern   Not on file  Social History Narrative   Not on file   Social Determinants of Health   Financial Resource Strain: Low Risk  (11/19/2022)   Overall Financial Resource Strain (CARDIA)    Difficulty of Paying Living Expenses: Not hard at all  Food  Insecurity: No Food Insecurity (11/19/2022)   Hunger Vital Sign    Worried About Running Out of Food in the Last Year: Never true    Ran Out of Food in the Last Year: Never true  Transportation Needs: No Transportation Needs (11/19/2022)   PRAPARE - Administrator, Civil Service (Medical): No    Lack of Transportation (Non-Medical): No  Physical Activity: Insufficiently Active (11/19/2022)   Exercise Vital Sign    Days of Exercise per Week: 2 days    Minutes of Exercise per Session: 40 min  Stress: No Stress Concern Present (11/19/2022)   [employer] of Occupational Health - Occupational Stress Questionnaire    Feeling of Stress : Not at all  Social Connections: Socially Integrated (11/19/2022)   Social Connection and Isolation  Panel [NHANES]    Frequency of Communication with Friends and Family: More than three times a week    Frequency of Social Gatherings with Friends and Family: Twice a week    Attends Religious Services: 1 to 4 times per year    Active Member of [employer] or Organizations: Yes    Attends Banker Meetings: 1 to 4 times per year    Marital Status: Married   Allergies  Allergen Reactions   Sulfa Antibiotics Rash   Family History  Problem Relation Age of Onset   Alzheimer's disease Mother    Heart disease Father        after age 36   Alzheimer's disease Maternal Aunt    Alzheimer's disease Maternal Uncle    Cancer Maternal Grandmother        breast   Cancer Maternal Grandfather        lung,  tobacco abuse   Colon cancer Neg Hx    Esophageal cancer Neg Hx    Stomach cancer Neg Hx    Colon polyps Neg Hx    Rectal cancer Neg Hx     Current Outpatient Medications (Endocrine & Metabolic):    predniSONE (DELTASONE) 10 MG tablet, 6 tablets daily for 3 days, then reduce by 1 tablet daily until gone  Current Outpatient Medications (Cardiovascular):    losartan (COZAAR) 50 MG tablet, Take 1 tablet (50 mg total) by mouth daily.   rosuvastatin (CRESTOR) 20 MG tablet, Take 1 tablet (20 mg total) by mouth daily.   tadalafil (CIALIS) 5 MG tablet, Take 1 tablet (5 mg total) by mouth daily.  Current Outpatient Medications (Respiratory):    loratadine (CLARITIN) 10 MG tablet, Take 10 mg by mouth daily as needed for allergies.   Current Outpatient Medications (Analgesics):    aspirin EC 81 MG tablet, Take 81 mg by mouth daily. Swallow whole.   Current Outpatient Medications (Other):    Cholecalciferol (VITAMIN D3) 125 MCG (5000 UT) CAPS, Take 1 capsule by mouth daily.   Coenzyme Q10 100 MG capsule, Take 100 mg by mouth daily.   gabapentin (NEURONTIN) 300 MG capsule, TAKE 1 CAPSULE BY MOUTH EVERYDAY AT BEDTIME   Multiple Vitamins-Minerals (CENTRUM SILVER 50+MEN) TABS,    Plant Sterol  Stanol-Pantethine (CHOLESTOFF COMPLETE) 300-100 MG CAPS,    TART CHERRY PO, Take 1 capsule by mouth daily. 3000 units daily.   Reviewed prior external information including notes and imaging from  primary care provider As well as notes that were available from care everywhere and other healthcare systems.  Past medical history, social, surgical and family history all reviewed in electronic medical record.  No pertanent information unless stated regarding to  the chief complaint.   Review of Systems:  No headache, visual changes, nausea, vomiting, diarrhea, constipation, dizziness, abdominal pain, skin rash, fevers, chills, night sweats, weight loss, swollen lymph nodes,  chest pain, shortness of breath, mood changes. POSITIVE muscle aches, body aches, joint swelling  Objective  Blood pressure 110/72, pulse 67, height 5\' 10"  (1.778 m), weight 193 lb (87.5 kg), SpO2 98 %.   General: No apparent distress alert and oriented x3 mood and affect normal, dressed appropriately.  HEENT: Pupils equal, extraocular movements intact  Respiratory: Patient's speak in full sentences and does not appear short of breath  Cardiovascular: No lower extremity edema, non tender, no erythema  Antalgic gait noted. Right shoulder exam does have impingement noted.  Patient does have positive speeds test noted.  Good strength of the shoulder noted.  Patient's right side of the neck does have a mass noted.  Limited muscular skeletal ultrasound was performed and interpreted by Antoine Primas, M   Limited ultrasound shows significant hypoechoic changes in the bicep tendon itself.  Questionable anterior labral pathology noted at the insertion of the bicep tendon.  Patient's rotator cuff does have some atrophy noted in Either Calcific or Uric Acid Deposits Noted. Impression: Calcific changes of the rotator cuff with likely labral pathology and bicep tendinitis  97110; 15 additional minutes spent for Therapeutic exercises as  stated in above notes.  This included exercises focusing on stretching, strengthening, with significant focus on eccentric aspects.   Long term goals include an improvement in range of motion, strength, endurance as well as avoiding reinjury. Patient's frequency would include in 1-2 times a day, 3-5 times a week for a duration of 6-12 weeks. Shoulder Exercises that included:  Basic scapular stabilization to include adduction and depression of scapula Scaption, focusing on proper movement and good control Internal and External rotation utilizing a theraband, with elbow tucked at side entire time Rows with theraband   Proper technique shown and discussed handout in great detail with ATC.  All questions were discussed and answered.       Impression and Recommendations:    The above documentation has been reviewed and is accurate and complete Judi Saa, DO

## 2022-12-26 ENCOUNTER — Ambulatory Visit (INDEPENDENT_AMBULATORY_CARE_PROVIDER_SITE_OTHER): Payer: BC Managed Care – PPO

## 2022-12-26 ENCOUNTER — Ambulatory Visit (INDEPENDENT_AMBULATORY_CARE_PROVIDER_SITE_OTHER): Payer: BC Managed Care – PPO | Admitting: Family Medicine

## 2022-12-26 ENCOUNTER — Encounter: Payer: Self-pay | Admitting: Family Medicine

## 2022-12-26 ENCOUNTER — Other Ambulatory Visit: Payer: Self-pay

## 2022-12-26 VITALS — BP 110/72 | HR 67 | Ht 70.0 in | Wt 193.0 lb

## 2022-12-26 DIAGNOSIS — E538 Deficiency of other specified B group vitamins: Secondary | ICD-10-CM

## 2022-12-26 DIAGNOSIS — G8929 Other chronic pain: Secondary | ICD-10-CM

## 2022-12-26 DIAGNOSIS — M25511 Pain in right shoulder: Secondary | ICD-10-CM

## 2022-12-26 DIAGNOSIS — D1721 Benign lipomatous neoplasm of skin and subcutaneous tissue of right arm: Secondary | ICD-10-CM | POA: Diagnosis not present

## 2022-12-26 DIAGNOSIS — M2550 Pain in unspecified joint: Secondary | ICD-10-CM | POA: Diagnosis not present

## 2022-12-26 DIAGNOSIS — M19011 Primary osteoarthritis, right shoulder: Secondary | ICD-10-CM | POA: Diagnosis not present

## 2022-12-26 LAB — CBC WITH DIFFERENTIAL/PLATELET
Basophils Absolute: 0 10*3/uL (ref 0.0–0.1)
Basophils Relative: 1 % (ref 0.0–3.0)
Eosinophils Absolute: 0.1 10*3/uL (ref 0.0–0.7)
Eosinophils Relative: 3.6 % (ref 0.0–5.0)
HCT: 49.4 % (ref 39.0–52.0)
Hemoglobin: 16.3 g/dL (ref 13.0–17.0)
Lymphocytes Relative: 23.3 % (ref 12.0–46.0)
Lymphs Abs: 0.9 10*3/uL (ref 0.7–4.0)
MCHC: 33 g/dL (ref 30.0–36.0)
MCV: 90.6 fl (ref 78.0–100.0)
Monocytes Absolute: 0.4 10*3/uL (ref 0.1–1.0)
Monocytes Relative: 10.9 % (ref 3.0–12.0)
Neutro Abs: 2.5 10*3/uL (ref 1.4–7.7)
Neutrophils Relative %: 61.2 % (ref 43.0–77.0)
Platelets: 136 10*3/uL — ABNORMAL LOW (ref 150.0–400.0)
RBC: 5.45 Mil/uL (ref 4.22–5.81)
RDW: 13.2 % (ref 11.5–15.5)
WBC: 4.1 10*3/uL (ref 4.0–10.5)

## 2022-12-26 LAB — TESTOSTERONE: Testosterone: 390.27 ng/dL (ref 300.00–890.00)

## 2022-12-26 LAB — VITAMIN B12: Vitamin B-12: 554 pg/mL (ref 211–911)

## 2022-12-26 LAB — COMPREHENSIVE METABOLIC PANEL
ALT: 29 U/L (ref 0–53)
AST: 26 U/L (ref 0–37)
Albumin: 4.7 g/dL (ref 3.5–5.2)
Alkaline Phosphatase: 48 U/L (ref 39–117)
BUN: 17 mg/dL (ref 6–23)
CO2: 29 mEq/L (ref 19–32)
Calcium: 9.8 mg/dL (ref 8.4–10.5)
Chloride: 100 mEq/L (ref 96–112)
Creatinine, Ser: 1.39 mg/dL (ref 0.40–1.50)
GFR: 53.86 mL/min — ABNORMAL LOW (ref >60.00)
Glucose, Bld: 92 mg/dL (ref 70–99)
Potassium: 4.6 mEq/L (ref 3.5–5.1)
Sodium: 137 mEq/L (ref 135–145)
Total Bilirubin: 0.9 mg/dL (ref 0.2–1.2)
Total Protein: 7.9 g/dL (ref 6.0–8.3)

## 2022-12-26 LAB — FERRITIN: Ferritin: 70.6 ng/mL (ref 22.0–322.0)

## 2022-12-26 LAB — TSH: TSH: 2 u[IU]/mL (ref 0.35–5.50)

## 2022-12-26 LAB — URIC ACID: Uric Acid, Serum: 5.5 mg/dL (ref 4.0–7.8)

## 2022-12-26 LAB — C-REACTIVE PROTEIN: CRP: <1 mg/dL (ref 0.5–20.0)

## 2022-12-26 LAB — IBC PANEL
Iron: 127 ug/dL (ref 42–165)
Saturation Ratios: 30.3 % (ref 20.0–50.0)
TIBC: 418.6 ug/dL (ref 250.0–450.0)
Transferrin: 299 mg/dL (ref 212.0–360.0)

## 2022-12-26 LAB — SEDIMENTATION RATE: Sed Rate: 9 mm/hr (ref 0–20)

## 2022-12-26 LAB — VITAMIN D 25 HYDROXY (VIT D DEFICIENCY, FRACTURES): VITD: 74.58 ng/mL (ref 30.00–100.00)

## 2022-12-26 NOTE — Assessment & Plan Note (Signed)
Recheck B12 

## 2022-12-26 NOTE — Patient Instructions (Signed)
Bicep subluxation All labs today Compression sleeve Xray R shoulder Omega 3 instead of Omega 6's  See me in 2 months

## 2022-12-26 NOTE — Assessment & Plan Note (Signed)
Patient is to have another repeat MRI in January 2025

## 2022-12-26 NOTE — Assessment & Plan Note (Signed)
Acute injury to chronic problems of the right shoulder.  Seems to be worsening at the moment.  Seems to be more of a bicep tendinopathy.  Given exercises and icing regimen.  Worsening symptoms would consider the possibility of either injection or advanced imaging.  Patient work with Event organiser return home exercises and follow-up again in 2 months

## 2022-12-27 DIAGNOSIS — E663 Overweight: Secondary | ICD-10-CM | POA: Diagnosis not present

## 2022-12-29 LAB — PTH, INTACT AND CALCIUM
Calcium: 9.8 mg/dL (ref 8.6–10.3)
PTH: 50 pg/mL (ref 16–77)

## 2022-12-29 LAB — CALCIUM, IONIZED: Calcium, Ion: 5.3 mg/dL (ref 4.7–5.5)

## 2022-12-29 LAB — ANA: Anti Nuclear Antibody (ANA): NEGATIVE

## 2022-12-29 LAB — CYCLIC CITRUL PEPTIDE ANTIBODY, IGG: Cyclic Citrullin Peptide Ab: <16 UNITS

## 2022-12-29 LAB — RHEUMATOID FACTOR: Rheumatoid fact SerPl-aCnc: 15 IU/mL — ABNORMAL HIGH (ref <14)

## 2022-12-29 LAB — ANGIOTENSIN CONVERTING ENZYME: Angiotensin-Converting Enzyme: 41 U/L (ref 9–67)

## 2023-02-18 ENCOUNTER — Other Ambulatory Visit: Payer: Self-pay | Admitting: Internal Medicine

## 2023-02-26 DIAGNOSIS — D225 Melanocytic nevi of trunk: Secondary | ICD-10-CM | POA: Diagnosis not present

## 2023-02-26 DIAGNOSIS — L821 Other seborrheic keratosis: Secondary | ICD-10-CM | POA: Diagnosis not present

## 2023-02-26 DIAGNOSIS — L578 Other skin changes due to chronic exposure to nonionizing radiation: Secondary | ICD-10-CM | POA: Diagnosis not present

## 2023-02-26 NOTE — Progress Notes (Unsigned)
[facility] [address] [place] [address] Phone: [phone] Subjective:   Bruce Donath, am serving as a scribe for Dr. Antoine Primas.  I'm seeing this patient by the request  of:  Sherlene Shams, MD  CC:   [id number]  12/26/2022 Acute injury to chronic problems of the right shoulder. Seems to be worsening at the moment. Seems to be more of a bicep tendinopathy. Given exercises and icing regimen. Worsening symptoms would consider the possibility of either injection or advanced imaging. Patient work with Event organiser return home exercises and follow-up again in 2 months   Update 02/27/2023 OLEGARIO DOCTOR is a 62 y.o. male coming in with complaint of R shoulder pain. Patient states that his L shoulder is sore in deltoid. Certain movements will cause sudden quick sharp pain in anterior aspect. Continues to also have R shoulder pain.   No pain in his back recently but has had some sciatic nerve pain that is radiating down the R leg to his foot. Has been manageable.   Would like to discuss lab results from last visit.            Past Medical History:  Diagnosis Date   Allergy    seasonal   Arthritis    left knee   Cancer (HCC)    skin cancer   Cataract    Hyperlipidemia    Hypertension    Past Surgical History:  Procedure Laterality Date   BRAIN SURGERY  1971   COLONOSCOPY  05/03/2021   ELEVATION OF DEPRESSED SKULL FRACTURE  07/16/1969   traumatic blow to head by golf club   , [facility])   MOHS SURGERY  09/14/2011   left temple, squamous   ROBOT ASSISTED LAPAROSCOPIC NEPHRECTOMY Right 04/20/2016   Procedure: XI ROBOTIC ASSISTED LAPAROSCOPIC RADICAL NEPHRECTOMY;  Surgeon: Sebastian Ache, MD;  Location: [facility];  Service: Urology;  Laterality: Right;   Social History   Socioeconomic History   Marital status: Married    Spouse name: Not on file   Number of children: Not on file   Years of education: Not on file    Highest education level: Some college, no degree  Occupational History   Not on file  Tobacco Use   Smoking status: Never    Passive exposure: Past   Smokeless tobacco: Never  Vaping Use   Vaping status: Never Used  Substance and Sexual Activity   Alcohol use: Yes    Alcohol/week: 4.0 standard drinks of alcohol    Types: 2 Glasses of wine, 2 Cans of beer per week    Comment: 2 glasses wine/2 beers per week   Drug use: Never   Sexual activity: Not on file  Other Topics Concern   Not on file  Social History Narrative   Not on file   Social Determinants of Health   Financial Resource Strain: Low Risk  (11/19/2022)   Overall Financial Resource Strain (CARDIA)    Difficulty of Paying Living Expenses: Not hard at all  Food Insecurity: No Food Insecurity (11/19/2022)   Hunger Vital Sign    Worried About Running Out of Food in the Last Year: Never true    Ran Out of Food in the Last Year: Never true  Transportation Needs: No Transportation Needs (11/19/2022)   PRAPARE - Administrator, Civil Service (Medical): No    Lack of Transportation (Non-Medical): No  Physical Activity: Insufficiently Active (11/19/2022)   Exercise Vital  Sign    Days of Exercise per Week: 2 days    Minutes of Exercise per Session: 40 min  Stress: No Stress Concern Present (11/19/2022)   [employer] of Occupational Health - Occupational Stress Questionnaire    Feeling of Stress : Not at all  Social Connections: Socially Integrated (11/19/2022)   Social Connection and Isolation Panel [NHANES]    Frequency of Communication with Friends and Family: More than three times a week    Frequency of Social Gatherings with Friends and Family: Twice a week    Attends Religious Services: 1 to 4 times per year    Active Member of [employer] or Organizations: Yes    Attends Banker Meetings: 1 to 4 times per year    Marital Status: Married   Allergies  Allergen Reactions   Sulfa Antibiotics Rash    Family History  Problem Relation Age of Onset   Alzheimer's disease Mother    Heart disease Father        after age 2   Alzheimer's disease Maternal Aunt    Alzheimer's disease Maternal Uncle    Cancer Maternal Grandmother        breast   Cancer Maternal Grandfather        lung,  tobacco abuse   Colon cancer Neg Hx    Esophageal cancer Neg Hx    Stomach cancer Neg Hx    Colon polyps Neg Hx    Rectal cancer Neg Hx     Current Outpatient Medications (Endocrine & Metabolic):    predniSONE (DELTASONE) 10 MG tablet, 6 tablets daily for 3 days, then reduce by 1 tablet daily until gone  Current Outpatient Medications (Cardiovascular):    losartan (COZAAR) 50 MG tablet, TAKE 1 TABLET BY MOUTH EVERY DAY   rosuvastatin (CRESTOR) 20 MG tablet, TAKE 1 TABLET BY MOUTH EVERY DAY   tadalafil (CIALIS) 5 MG tablet, Take 1 tablet (5 mg total) by mouth daily.  Current Outpatient Medications (Respiratory):    loratadine (CLARITIN) 10 MG tablet, Take 10 mg by mouth daily as needed for allergies.   Current Outpatient Medications (Analgesics):    aspirin EC 81 MG tablet, Take 81 mg by mouth daily. Swallow whole.   Current Outpatient Medications (Other):    Cholecalciferol (VITAMIN D3) 125 MCG (5000 UT) CAPS, Take 1 capsule by mouth daily.   Coenzyme Q10 100 MG capsule, Take 100 mg by mouth daily.   gabapentin (NEURONTIN) 300 MG capsule, TAKE 1 CAPSULE BY MOUTH EVERYDAY AT BEDTIME   Multiple Vitamins-Minerals (CENTRUM SILVER 50+MEN) TABS,    Plant Sterol Stanol-Pantethine (CHOLESTOFF COMPLETE) 300-100 MG CAPS,    TART CHERRY PO, Take 1 capsule by mouth daily. 3000 units daily.   Reviewed prior external information including notes and imaging from  primary care provider As well as notes that were available from care everywhere and other healthcare systems.  Past medical history, social, surgical and family history all reviewed in electronic medical record.  No pertanent information unless  stated regarding to the chief complaint.   Review of Systems:  No headache, visual changes, nausea, vomiting, diarrhea, constipation, dizziness, abdominal pain, skin rash, fevers, chills, night sweats, weight loss, swollen lymph nodes, body aches, joint swelling, chest pain, shortness of breath, mood changes. POSITIVE muscle aches  Objective  Blood pressure 110/72, pulse 63, height 5\' 10"  (1.778 m), weight 186 lb (84.4 kg), SpO2 98%.   General: No apparent distress alert and oriented x3 mood and affect normal, dressed  appropriately.  HEENT: Pupils equal, extraocular movements intact  Respiratory: Patient's speak in full sentences and does not appear short of breath  Cardiovascular: No lower extremity edema, non tender, no erythema   Bilateral shoulders do have tenderness to palpation noted.  No significant tenderness over the acromioclavicular joint bilaterally right greater than left.  Positive impingement noted but patient does have good range of motion.  Fullness noted in the posterior right side of the neck going down in the parascapular area.  Procedure: Real-time Ultrasound Guided Injection of right acromioclavicular joint Device: GE Logiq Q7 Ultrasound guided injection is preferred based studies that show increased duration, increased effect, greater accuracy, decreased procedural pain, increased response rate, and decreased cost with ultrasound guided versus blind injection.  Verbal informed consent obtained.  Time-out conducted.  Noted no overlying erythema, induration, or other signs of local infection.  Skin prepped in a sterile fashion.  Local anesthesia: Topical Ethyl chloride.  With sterile technique and under real time ultrasound guidance: With a 25-gauge half inch needle injected with 0.5 cc of 0.5% Marcaine and 0.5 cc of Kenalog 40 mg/mL Completed without difficulty  Advised to call if fevers/chills, erythema, induration, drainage, or persistent bleeding.  Impression:  Technically successful ultrasound guided injection.  Procedure: Real-time Ultrasound Guided Injection of left acromioclavicular joint Device: GE Logiq Q7 Ultrasound guided injection is preferred based studies that show increased duration, increased effect, greater accuracy, decreased procedural pain, increased response rate, and decreased cost with ultrasound guided versus blind injection.  Verbal informed consent obtained.  Time-out conducted.  Noted no overlying erythema, induration, or other signs of local infection.  Skin prepped in a sterile fashion.  Local anesthesia: Topical Ethyl chloride.  With sterile technique and under real time ultrasound guidance: With a 25-gauge half inch needle injected with 0.5 cc of 0.5% Marcaine and 0.5 cc of Kenalog 40 mg/mL Completed without difficulty  Advised to call if fevers/chills, erythema, induration, drainage, or persistent bleeding.  Impression: Technically successful ultrasound guided injection.     Impression and Recommendations:      The above documentation has been reviewed and is accurate and complete Judi Saa, DO

## 2023-02-27 ENCOUNTER — Other Ambulatory Visit: Payer: Self-pay

## 2023-02-27 ENCOUNTER — Encounter: Payer: Self-pay | Admitting: Family Medicine

## 2023-02-27 ENCOUNTER — Ambulatory Visit (INDEPENDENT_AMBULATORY_CARE_PROVIDER_SITE_OTHER): Payer: BC Managed Care – PPO | Admitting: Family Medicine

## 2023-02-27 ENCOUNTER — Ambulatory Visit (INDEPENDENT_AMBULATORY_CARE_PROVIDER_SITE_OTHER): Payer: BC Managed Care – PPO

## 2023-02-27 VITALS — BP 110/72 | HR 63 | Ht 70.0 in | Wt 186.0 lb

## 2023-02-27 DIAGNOSIS — M19012 Primary osteoarthritis, left shoulder: Secondary | ICD-10-CM

## 2023-02-27 DIAGNOSIS — D1721 Benign lipomatous neoplasm of skin and subcutaneous tissue of right arm: Secondary | ICD-10-CM | POA: Diagnosis not present

## 2023-02-27 DIAGNOSIS — M25511 Pain in right shoulder: Secondary | ICD-10-CM

## 2023-02-27 DIAGNOSIS — M19011 Primary osteoarthritis, right shoulder: Secondary | ICD-10-CM

## 2023-02-27 DIAGNOSIS — M542 Cervicalgia: Secondary | ICD-10-CM | POA: Diagnosis not present

## 2023-02-27 DIAGNOSIS — M5030 Other cervical disc degeneration, unspecified cervical region: Secondary | ICD-10-CM | POA: Diagnosis not present

## 2023-02-27 DIAGNOSIS — M19019 Primary osteoarthritis, unspecified shoulder: Secondary | ICD-10-CM | POA: Insufficient documentation

## 2023-02-27 DIAGNOSIS — M47812 Spondylosis without myelopathy or radiculopathy, cervical region: Secondary | ICD-10-CM | POA: Diagnosis not present

## 2023-02-27 NOTE — Assessment & Plan Note (Signed)
Patient does have a have the CT scan of the neck that they are continuing to monitor.  Monitoring yearly at this time.  There does appear to be potentially some impingement noted on the imaging in January 2024 that we will need to continue to monitor.  Seems to be at the C8-T1 region that could be consistent with some of patient's symptoms

## 2023-02-27 NOTE — Assessment & Plan Note (Signed)
Bilateral injections given today.  Tolerated the procedure well, we will see how patient responds to the conservative therapy.  Discussed icing regimen and home exercises, discussed which activities to do and which ones to avoid.  Increase activity slowly.  Follow-up again in 6 to 8 weeks otherwise.  Patient continues to have discomfort and pain would come evaluate glenohumeral injections bilaterally.  Concern with patient having the mass in the neck if any type of impingement could be potentially occurring as well.  Will monitor.  Patient's laboratory workup though did not show anything significantly abnormal.

## 2023-02-27 NOTE — Patient Instructions (Addendum)
Injected both AC joints Send message in 2 weeks See me again in 6-8 weeks
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
## 2023-04-17 ENCOUNTER — Ambulatory Visit (INDEPENDENT_AMBULATORY_CARE_PROVIDER_SITE_OTHER): Payer: BC Managed Care – PPO | Admitting: Family Medicine

## 2023-04-17 ENCOUNTER — Encounter: Payer: Self-pay | Admitting: Family Medicine

## 2023-04-17 VITALS — BP 118/76 | HR 76 | Ht 70.0 in | Wt 188.0 lb

## 2023-04-17 DIAGNOSIS — M9903 Segmental and somatic dysfunction of lumbar region: Secondary | ICD-10-CM

## 2023-04-17 DIAGNOSIS — D1721 Benign lipomatous neoplasm of skin and subcutaneous tissue of right arm: Secondary | ICD-10-CM

## 2023-04-17 DIAGNOSIS — G8929 Other chronic pain: Secondary | ICD-10-CM

## 2023-04-17 DIAGNOSIS — M25512 Pain in left shoulder: Secondary | ICD-10-CM

## 2023-04-17 DIAGNOSIS — M9904 Segmental and somatic dysfunction of sacral region: Secondary | ICD-10-CM

## 2023-04-17 DIAGNOSIS — M5441 Lumbago with sciatica, right side: Secondary | ICD-10-CM

## 2023-04-17 DIAGNOSIS — M9901 Segmental and somatic dysfunction of cervical region: Secondary | ICD-10-CM

## 2023-04-17 DIAGNOSIS — M9902 Segmental and somatic dysfunction of thoracic region: Secondary | ICD-10-CM | POA: Diagnosis not present

## 2023-04-17 DIAGNOSIS — M9908 Segmental and somatic dysfunction of rib cage: Secondary | ICD-10-CM

## 2023-04-17 DIAGNOSIS — M25511 Pain in right shoulder: Secondary | ICD-10-CM | POA: Diagnosis not present

## 2023-04-17 IMAGING — CT CT NECK W/ CM
5 series · 16 of 33 positions shown, 18 images · IV contrast (omnipaque)
Comparison: Ultrasound soft tissue neck 05/26/2021

CLINICAL DATA: Neck mass posteriorly on the right.

EXAM:
CT NECK WITH CONTRAST
TECHNIQUE: Multidetector CT imaging of the neck was performed using the
standard protocol following the bolus administration of intravenous
contrast.
CONTRAST:  75mL OMNIPAQUE IOHEXOL 300 MG/ML  SOLN

[Series 2: axial neck neck (person_name) 2.00 · axial · 0.63mm/px · z∈[-689,-553]mm · 3 of 137 slices shown]
[im 35/137  bone]
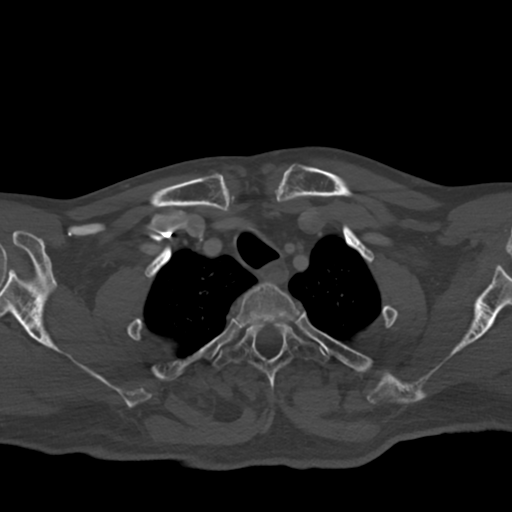
[im 69/137  bone]
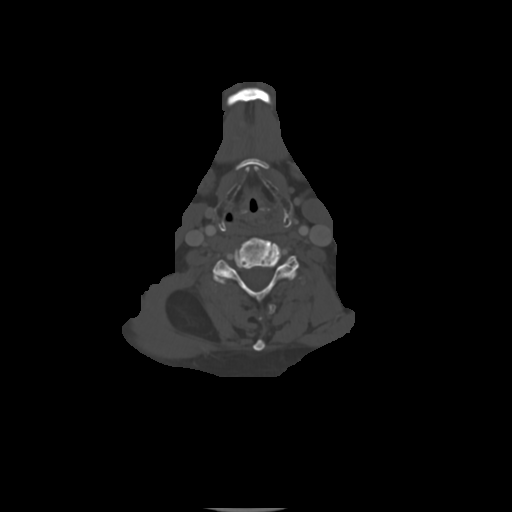
[im 103/137  bone]
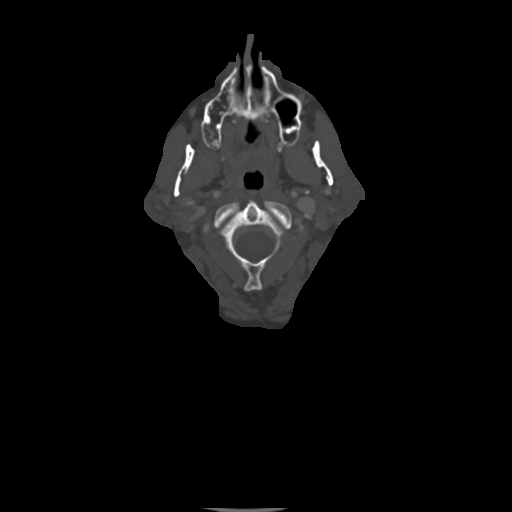

[Series 3: axial bone neck 2.00 · axial · 0.63mm/px · z∈[-667,-577]mm · 2 of 137 slices shown]
[im 46/137  bone]
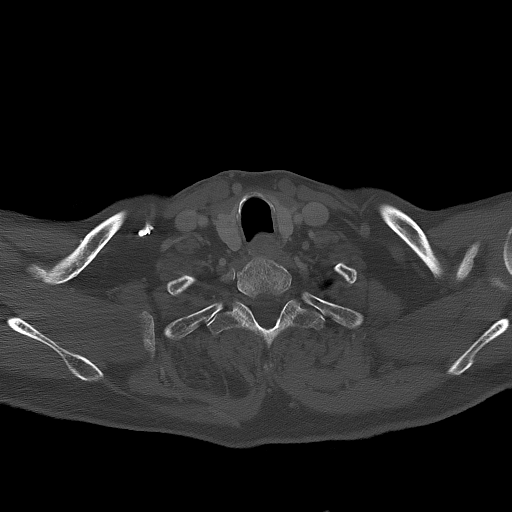
[im 91/137  bone]
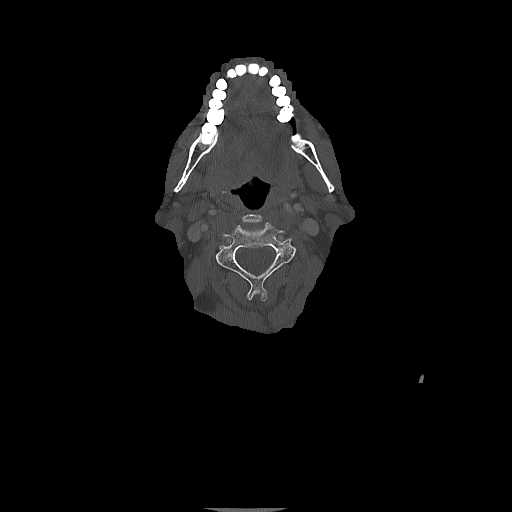

[Series 4: coronal neck neck (person_name) 2.00 cor · coronal · 0.63mm/px · 3 of 149 slices shown]
[im 38/149  bone]
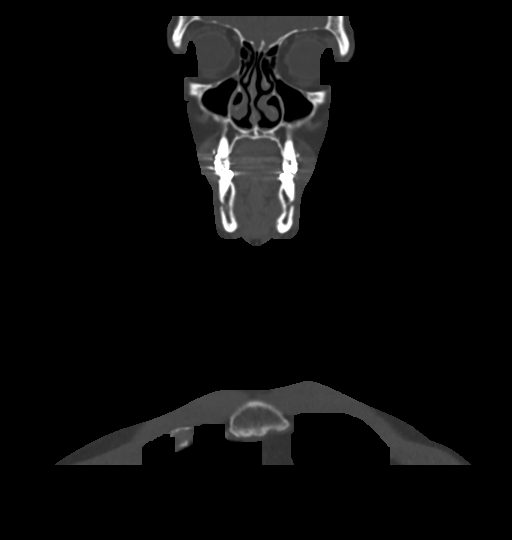
[im 62/149  bone]
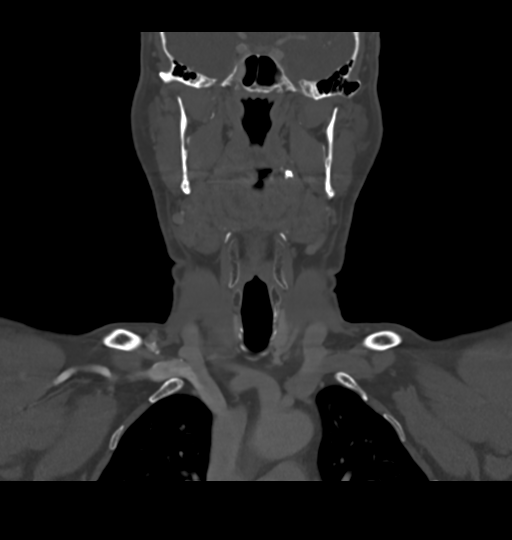
[im 87/149  bone]
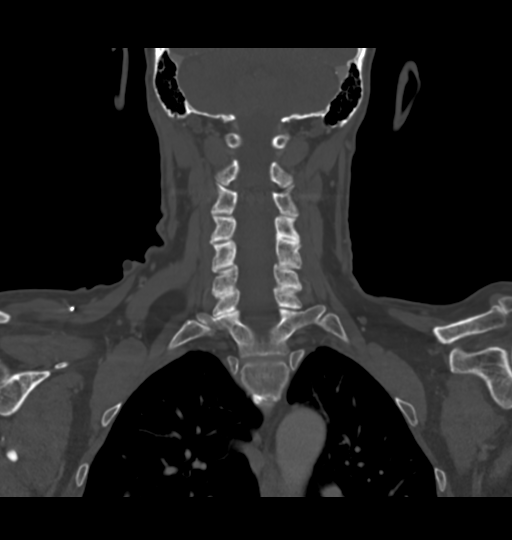

[Series 6: sagittal neck neck (person_name) 2.00 sag · sagittal · 0.58mm/px · 5 of 160 slices shown, 6 images]
[im 54/160  bone]
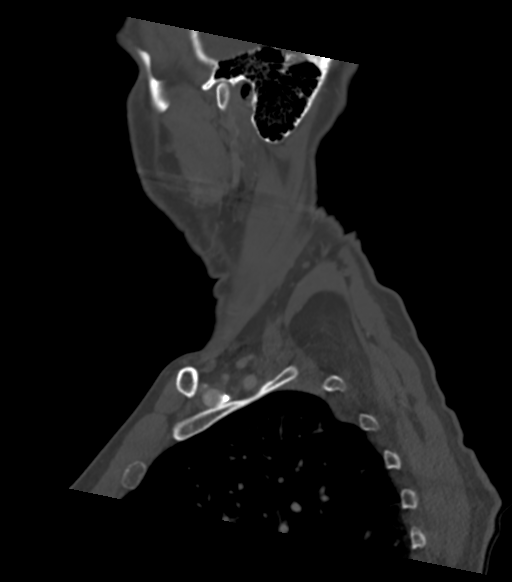
[im 67/160  bone]
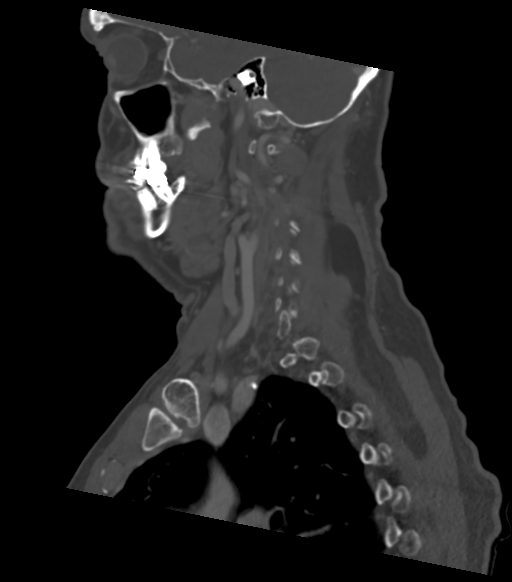
[im 80/160  soft-tissue]
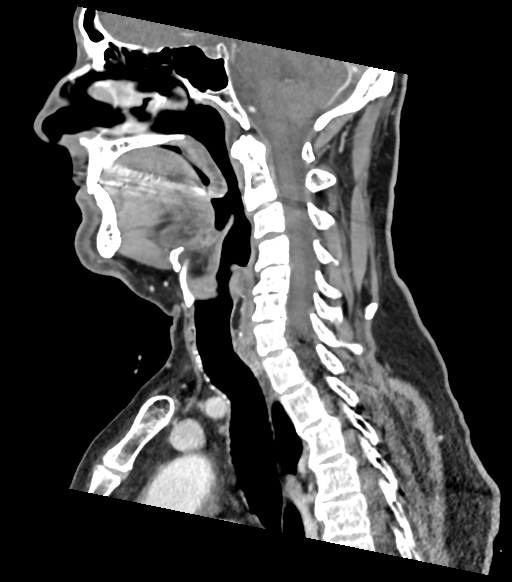
[im 80/160  bone]
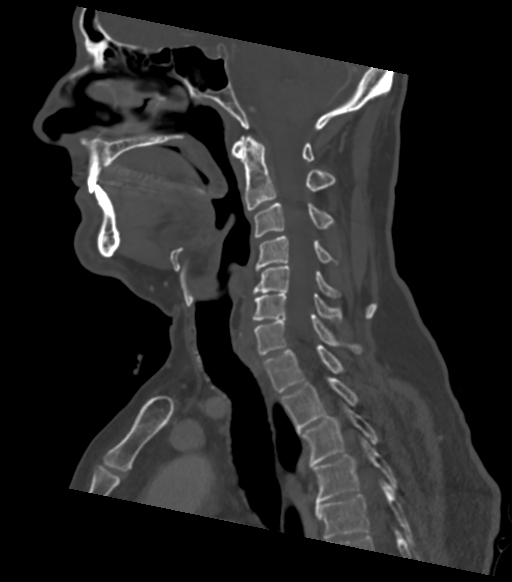
[im 93/160  bone]
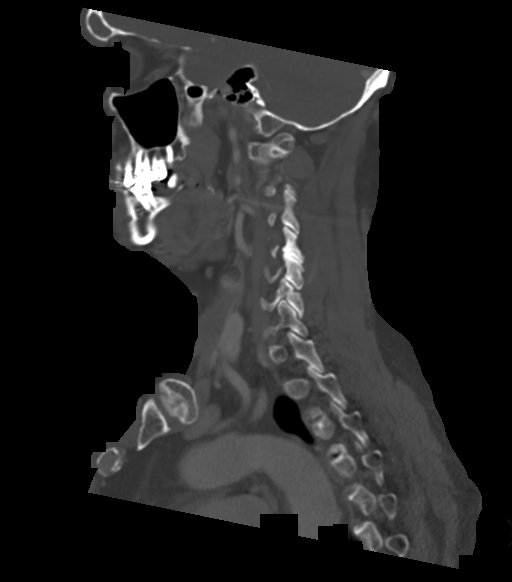
[im 107/160  bone]
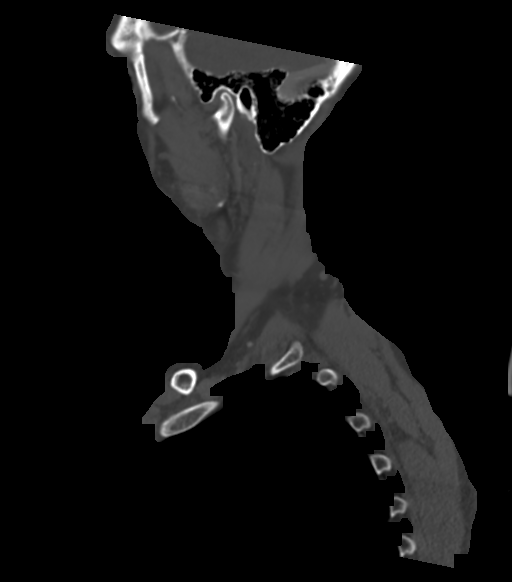

[Series 8: ax oropharynx neck neck (person_name) 2.00 ax · axial · 0.58mm/px · z∈[-736,-572]mm · 3 of 169 slices shown, 4 images]
[im 43/169  soft-tissue]
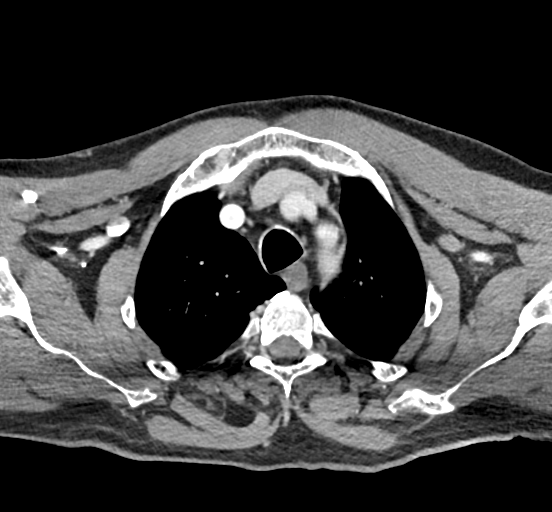
[im 43/169  bone]
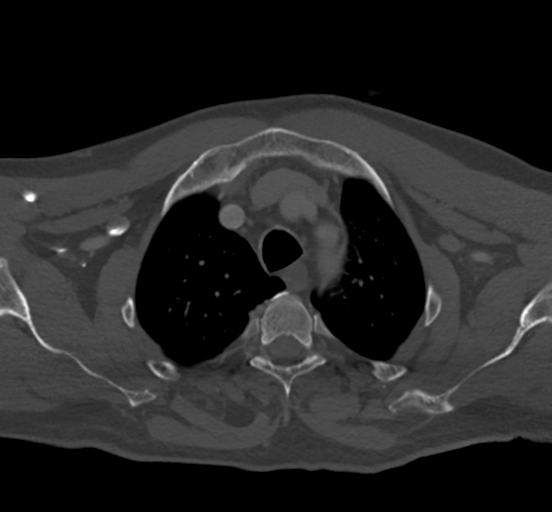
[im 85/169  bone]
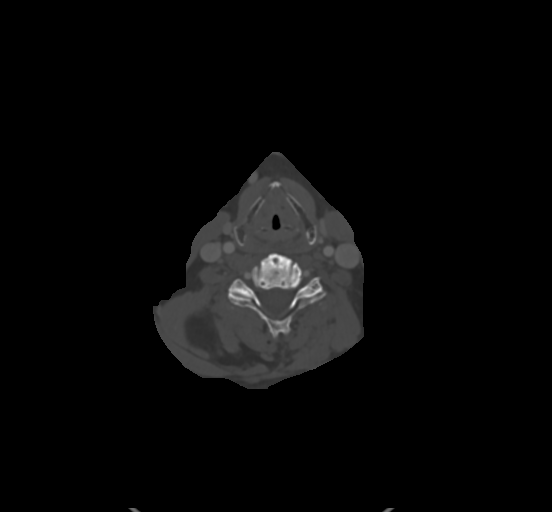
[im 127/169  bone]
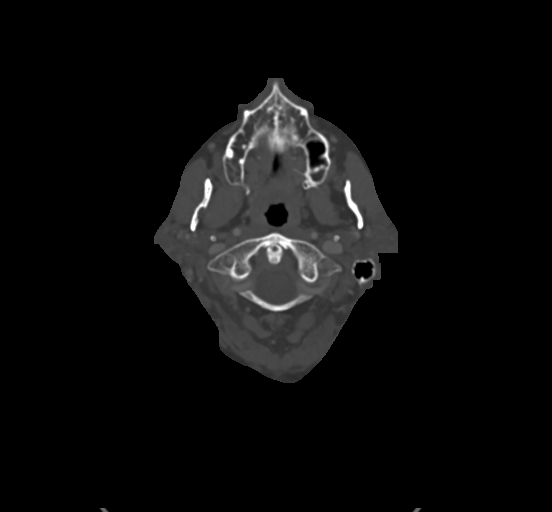

[16 of 33 positions shown; findings below may reference images not displayed]

FINDINGS: Pharynx and larynx: Normal. No mass or swelling.

Salivary glands: No inflammation, mass, or stone.

Thyroid: Negative

Lymph nodes: Negative for pathologic lymph node in the neck.

Vascular: Normal vascular enhancement. Mild atherosclerotic disease
in the carotid artery bilaterally.

Limited intracranial: Negative

Visualized orbits: Negative

Mastoids and visualized paranasal sinuses: Complete opacification
left lateral frontal sinus which is likely obstructed. Mild mucosal
edema right maxillary sinus. Mastoid and middle ear clear
bilaterally.

Skeleton: Cervical kyphosis and mild spondylosis. No acute skeletal
abnormality.

Upper chest: Lung apices clear bilaterally. Superior mediastinum
negative.

Other: Large fat density mass in the right posterior neck. This
appears to be fatty tissue within the erector spinae muscles on the
right. The mass extends over a craniocaudal dimension of 15 cm. The
mass extends from proximally C3 through T4. Axial dimensions of the
mass maximally are 6.5 x 3.5 cm. There is stranding within the fatty
tissue of the mass most prominent in the mid and inferior portion.
This stranding raises the possibility of liposarcoma.
IMPRESSION: Large soft tissue mass in the right posterior neck muscles
compatible with primarily fatty tissue. Stranding within the fat of
this mass raise the possibility of liposarcoma. Surgical evaluation
recommended.

No enlarged lymph nodes in the neck.

## 2023-04-17 NOTE — Patient Instructions (Signed)
[name]  Back is doing well See me in 2 months

## 2023-04-17 NOTE — Assessment & Plan Note (Signed)
Chronic right shoulder pain with worsening symptoms bilaterally.  I do feel that the degenerative disc disease of the cervical spine is likely contributing.  Discussed with patient about possible advanced imaging of the cervical spine that I think would be beneficial and see if patient would be a candidate for potential epidurals.  Past medical history significant for melanoma on the right upper arm and does have a very large lipoma on the right side that could be potentially given is some nerve impingement and contributing to some of the weakness of the shoulders.  Discussed the possibility of injections if needed.  Patient has responded well though also to osteopathic manipulation for some of the other chronic pains.  Attempted osteopathic manipulation today and hopeful that he will make some changes as well.  Follow-up again in 6 to 8 weeks worsening pain would highly encourage the advanced imaging.

## 2023-04-17 NOTE — Assessment & Plan Note (Signed)
Chronic problem again.  Discussed with patient about icing regimen and home exercises, discussed which activities to do and which ones to avoid.  Increasing activity slowly.  Follow-up again in 6 to 8 weeks

## 2023-05-01 ENCOUNTER — Other Ambulatory Visit: Payer: Self-pay | Admitting: Family Medicine

## 2023-05-27 ENCOUNTER — Other Ambulatory Visit (INDEPENDENT_AMBULATORY_CARE_PROVIDER_SITE_OTHER): Payer: BC Managed Care – PPO

## 2023-05-27 DIAGNOSIS — N1831 Chronic kidney disease, stage 3a: Secondary | ICD-10-CM

## 2023-05-27 DIAGNOSIS — I2583 Coronary atherosclerosis due to lipid rich plaque: Secondary | ICD-10-CM | POA: Diagnosis not present

## 2023-05-27 DIAGNOSIS — Z85528 Personal history of other malignant neoplasm of kidney: Secondary | ICD-10-CM | POA: Diagnosis not present

## 2023-05-27 LAB — COMPREHENSIVE METABOLIC PANEL
ALT: 23 U/L (ref 0–53)
AST: 23 U/L (ref 0–37)
Albumin: 4.6 g/dL (ref 3.5–5.2)
Alkaline Phosphatase: 55 U/L (ref 39–117)
BUN: 15 mg/dL (ref 6–23)
CO2: 30 meq/L (ref 19–32)
Calcium: 9.5 mg/dL (ref 8.4–10.5)
Chloride: 100 meq/L (ref 96–112)
Creatinine, Ser: 1.35 mg/dL (ref 0.40–1.50)
GFR: 55.62 mL/min — ABNORMAL LOW (ref >60.00)
Glucose, Bld: 89 mg/dL (ref 70–99)
Potassium: 4 meq/L (ref 3.5–5.1)
Sodium: 138 meq/L (ref 135–145)
Total Bilirubin: 1.1 mg/dL (ref 0.2–1.2)
Total Protein: 7.1 g/dL (ref 6.0–8.3)

## 2023-05-27 LAB — URINALYSIS, ROUTINE W REFLEX MICROSCOPIC
Bilirubin Urine: NEGATIVE
Hgb urine dipstick: NEGATIVE
Ketones, ur: NEGATIVE
Leukocytes,Ua: NEGATIVE
Nitrite: NEGATIVE
Specific Gravity, Urine: 1.015 (ref 1.000–1.030)
Total Protein, Urine: NEGATIVE
Urine Glucose: NEGATIVE
Urobilinogen, UA: 0.2 (ref 0.0–1.0)
pH: 6 (ref 5.0–8.0)

## 2023-05-28 LAB — LIPID PANEL W/REFLEX DIRECT LDL
Cholesterol: 193 mg/dL (ref <200)
HDL: 51 mg/dL (ref >=40)
LDL Cholesterol (Calc): 108 mg/dL — ABNORMAL HIGH
Non-HDL Cholesterol (Calc): 142 mg/dL — ABNORMAL HIGH (ref <130)
Total CHOL/HDL Ratio: 3.8 (calc) (ref <5.0)
Triglycerides: 218 mg/dL — ABNORMAL HIGH (ref <150)

## 2023-05-29 ENCOUNTER — Ambulatory Visit: Payer: BC Managed Care – PPO | Admitting: Internal Medicine

## 2023-05-29 VITALS — BP 124/72 | HR 82 | Ht 70.0 in | Wt 188.6 lb

## 2023-05-29 DIAGNOSIS — R972 Elevated prostate specific antigen [PSA]: Secondary | ICD-10-CM

## 2023-05-29 DIAGNOSIS — D126 Benign neoplasm of colon, unspecified: Secondary | ICD-10-CM | POA: Diagnosis not present

## 2023-05-29 DIAGNOSIS — N1831 Chronic kidney disease, stage 3a: Secondary | ICD-10-CM

## 2023-05-29 DIAGNOSIS — Z23 Encounter for immunization: Secondary | ICD-10-CM | POA: Diagnosis not present

## 2023-05-29 DIAGNOSIS — Z0000 Encounter for general adult medical examination without abnormal findings: Secondary | ICD-10-CM

## 2023-05-29 DIAGNOSIS — H6121 Impacted cerumen, right ear: Secondary | ICD-10-CM

## 2023-05-29 DIAGNOSIS — D1721 Benign lipomatous neoplasm of skin and subcutaneous tissue of right arm: Secondary | ICD-10-CM

## 2023-05-29 LAB — PSA: PSA: 0.54 ng/mL (ref 0.10–4.00)

## 2023-05-29 MED ORDER — TADALAFIL 5 MG PO TABS: 5.0000 mg | ORAL_TABLET | Freq: Every day | ORAL | 3 refills | Status: DC

## 2023-05-29 MED ORDER — NYSTATIN 100000 UNIT/GM EX POWD: 1.0000 | Freq: Two times a day (BID) | CUTANEOUS | 0 refills | Status: DC

## 2023-05-29 NOTE — Progress Notes (Signed)
Patient ID: Daniel Lawrence, male    DOB: [birth year]  Age: 62 y.o. MRN: [id number]  The patient is here for annual preventive examination and management of other chronic and acute problems.   The risk factors are reflected in the social history.   The roster of all physicians providing medical care to patient - is listed in the Snapshot section of the chart.   Activities of daily living:  The patient is 100% independent in all ADLs: dressing, toileting, feeding as well as independent mobility   Home safety : The patient has smoke detectors in the home. They wear seatbelts.  There are no unsecured firearms at home. There is no violence in the home.    There is no risks for hepatitis, STDs or HIV. There is no   history of blood transfusion. They have no travel history to infectious disease endemic areas of the world.   The patient has seen their dentist in the last six month. They have seen their eye doctor in the last year. The patinet  denies slight hearing difficulty with regard to whispered voices and some television programs.  They have deferred audiologic testing in the last year.  They do not  have excessive sun exposure. Discussed the need for sun protection: hats, long sleeves and use of sunscreen if there is significant sun exposure.    Diet: the importance of a healthy diet is discussed. They do have a healthy diet.   The benefits of regular aerobic exercise were discussed. The patient  exercises  3 to 5 days per week  for  60 minutes.    Depression screen: there are no signs or vegative symptoms of depression- irritability, change in appetite, anhedonia, sadness/tearfullness.   The following portions of the patient's history were reviewed and updated as appropriate: allergies, current medications, past family history, past medical history,  past surgical history, past social history  and problem list.   Visual acuity was not assessed per patient preference since the patient has  regular follow up with an  ophthalmologist. Hearing and body mass index were assessed and reviewed.    During the course of the visit the patient was educated and counseled about appropriate screening and preventive services including : fall prevention , diabetes screening, nutrition counseling, colorectal cancer screening, and recommended immunizations.    Chief Complaint:   scrotal,  groin itching without noticeable rash in August.  No rash,  itches just once a day,  has used [employer] generic twice daily  (clotrimazole)   has become irritated /bleeding due to excessive rubbing.  "Almost like a wart"   Around the same time he had  raised itchy patches on his back and chest . Denies excessive sweating.    Exam:  skin starting to look macerated  .    2) mass on right side of neck , referred for imaging with ultrasound,  referred to gen surg for excision.  Excision deferred by gen surg,  sent to [facility] oncology/sarcoma team .  Additional imaging confirmed lipoma.  No excision due to locatoin,   3)  left knee pain .  Has reduced alcohol ,  feels like there is less inflammation . Wants to drop 10 lbs   by starting a walking program and increasing his yoga.  Wants a referral for a personal trainer to improve adherence.  He is considering MRI to eval some small tears and medial DJD .  Going to [place] in January  4) right ear  cerumen impaction   Review of Symptoms  Patient denies headache, fevers, malaise, unintentional weight loss, skin rash, eye pain, sinus congestion and sinus pain, sore throat, dysphagia,  hemoptysis , cough, dyspnea, wheezing, chest pain, palpitations, orthopnea, edema, abdominal pain, nausea, melena, diarrhea, constipation, flank pain, dysuria, hematuria, urinary  Frequency, nocturia, numbness, tingling, seizures,  Focal weakness, Loss of consciousness,  Tremor, insomnia, depression, anxiety, and suicidal ideation.    Physical Exam:  BP 124/72   Pulse 82   Ht 5\' 10"  (1.778 m)    Wt 188 lb 9.6 oz (85.5 kg)   SpO2 98%   BMI 27.06 kg/m    Physical Exam Vitals reviewed.  Constitutional:      General: He is not in acute distress.    Appearance: Normal appearance. He is normal weight. He is not ill-appearing, toxic-appearing or diaphoretic.  HENT:     Head: Normocephalic and atraumatic.     Right Ear: Tympanic membrane, ear canal and external ear normal. There is no impacted cerumen.     Left Ear: Tympanic membrane, ear canal and external ear normal. There is no impacted cerumen.     Nose: Nose normal.     Mouth/Throat:     Mouth: Mucous membranes are moist.     Pharynx: Oropharynx is clear.  Eyes:     General: No scleral icterus.       Right eye: No discharge.        Left eye: No discharge.     Conjunctiva/sclera: Conjunctivae normal.  Neck:     Thyroid: No thyromegaly.     Vascular: No carotid bruit or JVD.  Cardiovascular:     Rate and Rhythm: Normal rate and regular rhythm.     Heart sounds: Normal heart sounds.  Pulmonary:     Effort: Pulmonary effort is normal. No respiratory distress.     Breath sounds: Normal breath sounds.  Abdominal:     General: Bowel sounds are normal.     Palpations: Abdomen is soft. There is no mass.     Tenderness: There is no abdominal tenderness. There is no guarding or rebound.  Musculoskeletal:        General: Normal range of motion.     Cervical back: Normal range of motion and neck supple.  Lymphadenopathy:     Cervical: No cervical adenopathy.  Skin:    General: Skin is warm and dry.  Neurological:     General: No focal deficit present.     Mental Status: He is alert and oriented to person, place, and time. Mental status is at baseline.  Psychiatric:        Mood and Affect: Mood normal.        Behavior: Behavior normal.        Thought Content: Thought content normal.        Judgment: Judgment normal.    Assessment and Plan: Increased prostate specific antigen (PSA) velocity Assessment & Plan: PSA  doubled but rose < 0.75 by last check in December 2023. Repeatting today,  has not seen urology   Lab Results  Component Value Date   PSA 0.88 07/02/2022   PSA 0.33 04/15/2020   PSA 1.1 04/13/2019      Orders: -     PSA  Hearing loss due to cerumen impaction, right Assessment & Plan: Referring to [facility] for irrigation   Orders: -     Ambulatory referral to ENT  Tubular adenoma of colon Assessment & Plan: Dec 2023.  Repeat 7 years.    Need for shingles vaccine -     Varicella-zoster vaccine IM  Need for influenza vaccination -     Flu vaccine trivalent PF, 6mos and older(Flulaval,Afluria,Fluarix,Fluzone)  Stage 3a chronic kidney disease (HCC) Assessment & Plan: Improved by current labs   Avoiding NSAIDs.  Continue losartan  Lab Results  Component Value Date   NA 138 05/27/2023   K 4.0 05/27/2023   CL 100 05/27/2023   CO2 30 05/27/2023   Lab Results  Component Value Date   CREATININE 1.35 05/27/2023   Lab Results  Component Value Date   MICROALBUR <0.7 11/20/2022   MICROALBUR 15.4 (H) 06/23/2014         Encounter for preventive health examination Assessment & Plan: age appropriate education and counseling updated, referrals for preventative services and immunizations addressed, dietary and smoking counseling addressed, most recent labs reviewed.  I have personally reviewed and have noted:   1) the patient's medical and social history 2) The pt's use of alcohol, tobacco, and illicit drugs 3) The patient's current medications and supplements 4) Functional ability including ADL's, fall risk, home safety risk, hearing and visual impairment 5) Diet and physical activities 6) Evidence for depression or mood disorder 7) The patient's height, weight, and BMI have been recorded in the chart8) I have ordered and reviewed a 12 lead EKG and find that there are no acute changes and patient is in sinus rhythm.     I have made referrals, and provided  counseling and education based on review of the above    Lipoma of right shoulder Assessment & Plan: He was referred to sarcoma specialist at [facility] .  repeat imaging c/w lipoma.  No excision lpanned at this time.    Other orders -     Nystatin; Apply 1 Application topically 2 (two) times daily. To rash until resolved.  Dispense: 15 g; Refill: 0 -     Tadalafil; Take 1 tablet (5 mg total) by mouth daily.  Dispense: 90 tablet; Refill: 3    Return in about 6 months (around 11/26/2023).  Sherlene Shams, MD

## 2023-05-29 NOTE — Patient Instructions (Addendum)
Your 2nd and final) Shingles vaccine can be repeated in 2 months or doen at your 6 month follow up  Right ear is impacted with cerumen . Referral to ENT Made

## 2023-05-29 NOTE — Assessment & Plan Note (Signed)
Referring to [facility] for irrigation

## 2023-05-29 NOTE — Assessment & Plan Note (Signed)
He was referred to sarcoma specialist at [facility] .  repeat imaging c/w lipoma.  No excision lpanned at this time.

## 2023-05-29 NOTE — Assessment & Plan Note (Signed)

## 2023-05-29 NOTE — Assessment & Plan Note (Addendum)
PSA doubled but rose < 0.75 by last check in December 2023. Repeatting today,  has not seen urology   Lab Results  Component Value Date   PSA 0.88 07/02/2022   PSA 0.33 04/15/2020   PSA 1.1 04/13/2019

## 2023-05-29 NOTE — Assessment & Plan Note (Signed)
Improved by current labs   Avoiding NSAIDs.  Continue losartan  Lab Results  Component Value Date   NA 138 05/27/2023   K 4.0 05/27/2023   CL 100 05/27/2023   CO2 30 05/27/2023   Lab Results  Component Value Date   CREATININE 1.35 05/27/2023   Lab Results  Component Value Date   MICROALBUR <0.7 11/20/2022   MICROALBUR 15.4 (H) 06/23/2014

## 2023-05-29 NOTE — Assessment & Plan Note (Signed)
Dec 2023.  Repeat 7 years.

## 2023-06-04 ENCOUNTER — Ambulatory Visit: Payer: BC Managed Care – PPO | Attending: Family Medicine | Admitting: Physical Therapy

## 2023-06-04 DIAGNOSIS — G8929 Other chronic pain: Secondary | ICD-10-CM | POA: Insufficient documentation

## 2023-06-04 DIAGNOSIS — M25511 Pain in right shoulder: Secondary | ICD-10-CM | POA: Diagnosis not present

## 2023-06-04 DIAGNOSIS — M6281 Muscle weakness (generalized): Secondary | ICD-10-CM | POA: Insufficient documentation

## 2023-06-04 DIAGNOSIS — M25512 Pain in left shoulder: Secondary | ICD-10-CM | POA: Insufficient documentation

## 2023-06-06 NOTE — Therapy (Signed)
OUTPATIENT PHYSICAL THERAPY SHOULDER EVALUATION  Patient Name: Daniel Lawrence MRN: [id number] DOB:Oct 11, 1960, 62 y.o., male Today's Date: 06/04/2023  END OF SESSION:  PT End of Session - 06/06/23 0849     Visit Number 1    Number of Visits 12    Date for PT Re-Evaluation 07/16/23    PT Start Time 0727    PT Stop Time 0821    PT Time Calculation (min) 54 min             Past Medical History:  Diagnosis Date   Allergy    seasonal   Arthritis    left knee   Cancer (HCC)    skin cancer   Cataract    Hyperlipidemia    Hypertension    Past Surgical History:  Procedure Laterality Date   BRAIN SURGERY  1971   COLONOSCOPY  05/03/2021   ELEVATION OF DEPRESSED SKULL FRACTURE  07/16/1969   traumatic blow to head by golf club   , [facility])   MOHS SURGERY  09/14/2011   left temple, squamous   ROBOT ASSISTED LAPAROSCOPIC NEPHRECTOMY Right 04/20/2016   Procedure: XI ROBOTIC ASSISTED LAPAROSCOPIC RADICAL NEPHRECTOMY;  Surgeon: Sebastian Ache, MD;  Location: [facility];  Service: Urology;  Laterality: Right;   Patient Active Problem List   Diagnosis Date Noted   Tubular adenoma of colon 05/29/2023   Hearing loss due to cerumen impaction, right 05/29/2023   AC (acromioclavicular) arthritis 02/27/2023   Increased prostate specific antigen (PSA) velocity 07/03/2022   Lipoma of right shoulder 05/21/2022   Low back pain 09/15/2021   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 07/22/2021   Aortic atherosclerosis (HCC) 04/18/2021   Melanoma in situ of right upper arm (HCC) 04/18/2021   H/O right nephrectomy 04/17/2020   Degenerative arthritis of left knee 02/25/2018   Chronic pain of left knee 02/08/2018   CKD (chronic kidney disease) stage 3, GFR 30-59 ml/min (HCC) 02/08/2018   Coronary atherosclerosis due to lipid rich plaque 05/26/2016   History of renal cell carcinoma 04/20/2016   B12 deficiency 04/08/2016   Encounter for preventive health  examination 06/24/2014   Hypertension 06/23/2014   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 Reports an increase in R shoulder pain while wringing out of wash cloth and heard R shoulder pop.  Pt. States this happened 6 months ago.  Pt. C/o weakness in B shoulders and limited with Yoga positions.  Pt. Received injections in B shoulders with benefit.  Pt. States "I feel weak in both shoulders".    Hand dominance: Right  PERTINENT HISTORY: Pt. Known well to PT clinic.  See previous PT notes/ MD notes.   PAIN:  Are you having pain? No c/o shoulder pain at time of evaluation.   PRECAUTIONS: None  RED FLAGS: None   WEIGHT BEARING RESTRICTIONS: No  FALLS:  Has patient fallen in last 6 months? No  LIVING ENVIRONMENT: Lives with: lives with their spouse Lives in: House/apartment Has following equipment at home: None  OCCUPATION: Research scientist (medical)  PLOF: Independent  PATIENT GOALS:  Increase B shoulder strength/ get referral to personal trainer   NEXT MD VISIT: TBD  OBJECTIVE:  Note: Objective measures were completed at Evaluation unless otherwise noted.  DIAGNOSTIC FINDINGS:  FINDINGS: Mild glenohumeral degenerative change without loss of joint space. No other bony or soft tissue abnormalities identified.   IMPRESSION: Mild glenohumeral degenerative  change without loss of joint space.     Electronically Signed   By: Gerome [name] III M.D.   On: 12/28/2022 09:37  CERVICAL SPINE - 3 VIEW   COMPARISON:  None Available.   FINDINGS: No fracture, dislocation or subluxation. No spondylolisthesis. No osteolytic or osteoblastic changes. Prevertebral and cervical cranial soft tissues are unremarkable.   Degenerative disc disease noted with disc space narrowing and marginal osteophytes at C4-6.   IMPRESSION: Degenerative changes. No acute osseous abnormalities.   Electronically Signed   By: Layla Maw M.D.   On: 03/07/2023 13:52  PATIENT SURVEYS:  FOTO initial 68/ goal 74  COGNITION: Overall cognitive status: Within functional limits for tasks assessed     SENSATION: WFL  POSTURE: Slight rounded shoulder/ pec. tightness  UPPER EXTREMITY ROM:   Active ROM Right eval Left eval  Shoulder flexion 180 deg. 180 deg.  Shoulder extension    Shoulder abduction 170 deg. 170 deg.  Shoulder adduction    Shoulder internal rotation 75 deg. 75 deg.  Shoulder external rotation 80 deg. 80 deg.  Elbow flexion WNL WNL  Elbow extension WNL WNL  Wrist flexion    Wrist extension    Wrist ulnar deviation    Wrist radial deviation    Wrist pronation    Wrist supination    (Blank rows = not tested)  Cervical AROM: flexion 57 deg./ extension 66 deg./ L lat. Flex. 54 deg./ R lat. Flex. 44 deg. (R sided lipoma)/ L rotn. 70 deg./ R rotn. 67 deg.    UPPER EXTREMITY MMT:  MMT Right eval Left eval  Shoulder flexion 4 4  Shoulder extension    Shoulder abduction 4- 4-  Shoulder adduction    Shoulder internal rotation 4+ 4+  Shoulder external rotation 4 4  Middle trapezius 4 4  Lower trapezius 4 4  Elbow flexion 5 5  Elbow extension 5 5  Wrist flexion    Wrist extension    Wrist ulnar deviation    Wrist radial deviation    Wrist pronation    Wrist supination    Grip strength (lbs) 85.9# 81.3#  (Blank rows = not  tested)  SHOULDER SPECIAL TESTS: Impingement tests: Neer impingement test: negative and Hawkins/Kennedy impingement test: negative Rotator cuff assessment: Empty  can test: negative and Belly press test: negative  JOINT MOBILITY TESTING:  Good B shoulder capsular mobility (AP/PA/inf.).  No c/o pain.    PALPATION:  Negative   TODAY'S TREATMENT:                                                                                                                                         DATE: 06/04/2023  Evaluation/ See HEP   PATIENT EDUCATION: Education details: Access Code: [email or web address] Person educated: Patient Education method: Explanation, Demonstration, and Handouts Education comprehension: verbalized understanding and returned demonstration  HOME EXERCISE PROGRAM: Access Code: [email or web address] URL: [email or web address] Date: 06/04/2023 Prepared by: Dorene Grebe  Exercises - Standing Shoulder Flexion with Resistance  - 1 x daily - 4 x weekly - 2 sets - 15 reps - Standing Single Arm Shoulder Abduction with Resistance  - 1 x daily - 4 x weekly - 2 sets - 15 reps - Standing Shoulder External Rotation with Resistance  - 1 x daily - 4 x weekly - 2 sets - 15 reps  ASSESSMENT:  CLINICAL IMPRESSION: Patient is a pleasant 62 y.o. male who was seen today for physical therapy evaluation and treatment for B shoulder pain/ muscle weakness.   Pt. Currently reports no pain and presents with good B shoulder ROM all planes of movement.  Pt. Has generalized B shoulder muscle weakness (see above). Pt. Will benefit from skilled PT services to develop HEP to increase B shoulder strength to improve pain-free mobility with household tasks/ Yoga ex.    OBJECTIVE IMPAIRMENTS: decreased activity tolerance, decreased mobility, decreased strength, impaired UE functional use, improper body mechanics, postural dysfunction, and pain.   ACTIVITY LIMITATIONS: carrying, lifting, and locomotion  level  PARTICIPATION LIMITATIONS: cleaning, interpersonal relationship, community activity, and occupation  PERSONAL FACTORS: Fitness and Past/current experiences are also affecting patient's functional outcome.   REHAB POTENTIAL: Good  CLINICAL DECISION MAKING: Stable/uncomplicated  EVALUATION COMPLEXITY: Low   GOALS: Goals reviewed with patient? Yes  SHORT TERM GOALS: Target date: 06/25/23  Pt. Will be independent with HEP to increase B UE muscle strength 1/2 muscle grade to improve pain-free mobility.   Baseline:  see above Goal status: INITIAL   LONG TERM GOALS: Target date: 07/16/23  Pt. Will increase FOTO to 74 to improve pain-free mobility.   Baseline: initial 68 Goal status: INITIAL  2.  Pt. Able to complete plank/ upward dog yoga poses with no shoulder pain/ limitations.  Baseline: pain limited/ weakness noted Goal status: INITIAL  3.  Pt. Will progress with B UE ex. Program with assist from personal trainer to maintain functional gains from PT ex. Program.   Baseline: pt. Hoping to work with Systems analyst.  Goal status: INITIAL  PLAN:  PT FREQUENCY: 1-2x/week  PT DURATION: 6 weeks  PLANNED INTERVENTIONS: 97110-Therapeutic exercises, 97530- Therapeutic activity, [id number]- Neuromuscular re-education, 97535- Self Care, 16109- Manual therapy, 97014- Electrical stimulation (unattended),  Taping, Dry Needling, Joint mobilization, Cryotherapy, and Moist heat  PLAN FOR NEXT SESSION: Reassess HEP  Cammie Mcgee, PT, DPT # [phone] 06/06/2023, 8:53 AM

## 2023-06-11 ENCOUNTER — Ambulatory Visit: Payer: BC Managed Care – PPO | Admitting: Physical Therapy

## 2023-06-11 ENCOUNTER — Encounter: Payer: Self-pay | Admitting: Physical Therapy

## 2023-06-11 DIAGNOSIS — G8929 Other chronic pain: Secondary | ICD-10-CM

## 2023-06-11 DIAGNOSIS — H6123 Impacted cerumen, bilateral: Secondary | ICD-10-CM | POA: Diagnosis not present

## 2023-06-11 DIAGNOSIS — H903 Sensorineural hearing loss, bilateral: Secondary | ICD-10-CM | POA: Diagnosis not present

## 2023-06-11 DIAGNOSIS — M6281 Muscle weakness (generalized): Secondary | ICD-10-CM

## 2023-06-11 DIAGNOSIS — M25512 Pain in left shoulder: Secondary | ICD-10-CM | POA: Diagnosis not present

## 2023-06-11 DIAGNOSIS — M25511 Pain in right shoulder: Secondary | ICD-10-CM | POA: Diagnosis not present

## 2023-06-11 DIAGNOSIS — H9203 Otalgia, bilateral: Secondary | ICD-10-CM | POA: Diagnosis not present

## 2023-06-11 NOTE — Therapy (Signed)
OUTPATIENT PHYSICAL THERAPY SHOULDER TREATMENT  Patient Name: Daniel Lawrence MRN: [id number] DOB:July 18, 1960, 62 y.o., male Today's Date: 06/11/2023  END OF SESSION:  PT End of Session - 06/11/23 1334     Visit Number 2    Number of Visits 12    Date for PT Re-Evaluation 07/16/23    PT Start Time 0732    PT Stop Time 0822    PT Time Calculation (min) 50 min             Past Medical History:  Diagnosis Date   Allergy    seasonal   Arthritis    left knee   Cancer (HCC)    skin cancer   Cataract    Hyperlipidemia    Hypertension    Past Surgical History:  Procedure Laterality Date   BRAIN SURGERY  1971   COLONOSCOPY  05/03/2021   ELEVATION OF DEPRESSED SKULL FRACTURE  07/16/1969   traumatic blow to head by golf club   , [facility])   MOHS SURGERY  09/14/2011   left temple, squamous   ROBOT ASSISTED LAPAROSCOPIC NEPHRECTOMY Right 04/20/2016   Procedure: XI ROBOTIC ASSISTED LAPAROSCOPIC RADICAL NEPHRECTOMY;  Surgeon: Sebastian Ache, MD;  Location: [facility];  Service: Urology;  Laterality: Right;   Patient Active Problem List   Diagnosis Date Noted   Tubular adenoma of colon 05/29/2023   Hearing loss due to cerumen impaction, right 05/29/2023   AC (acromioclavicular) arthritis 02/27/2023   Increased prostate specific antigen (PSA) velocity 07/03/2022   Lipoma of right shoulder 05/21/2022   Low back pain 09/15/2021   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 07/22/2021   Aortic atherosclerosis (HCC) 04/18/2021   Melanoma in situ of right upper arm (HCC) 04/18/2021   H/O right nephrectomy 04/17/2020   Degenerative arthritis of left knee 02/25/2018   Chronic pain of left knee 02/08/2018   CKD (chronic kidney disease) stage 3, GFR 30-59 ml/min (HCC) 02/08/2018   Coronary atherosclerosis due to lipid rich plaque 05/26/2016   History of renal cell carcinoma 04/20/2016   B12 deficiency 04/08/2016   Encounter for preventive health  examination 06/24/2014   Hypertension 06/23/2014   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 Reports an increase in R shoulder pain while wringing out of wash cloth and heard R shoulder pop.  Pt. States this happened 6 months ago.  Pt. C/o weakness in B shoulders and limited with Yoga positions.  Pt. Received injections in B shoulders with benefit.  Pt. States "I feel weak in both shoulders".    Hand dominance: Right  PERTINENT HISTORY: Pt. Known well to PT clinic.  See previous PT notes/ MD notes.   PAIN:  Are you having pain? No c/o shoulder pain at time of evaluation.   PRECAUTIONS: None  RED FLAGS: None   WEIGHT BEARING RESTRICTIONS: No  FALLS:  Has patient fallen in last 6 months? No  LIVING ENVIRONMENT: Lives with: lives with their spouse Lives in: House/apartment Has following equipment at home: None  OCCUPATION: Research scientist (medical)  PLOF: Independent  PATIENT GOALS:  Increase B shoulder strength/ get referral to personal trainer   NEXT MD VISIT: TBD  OBJECTIVE:  Note: Objective measures were completed at Evaluation unless otherwise noted.  DIAGNOSTIC FINDINGS:  FINDINGS: Mild glenohumeral degenerative change without loss of joint space. No other bony or soft tissue abnormalities identified.   IMPRESSION: Mild glenohumeral degenerative  change without loss of joint space.     Electronically Signed   By: Gerome [name] III M.D.   On: 12/28/2022 09:37  CERVICAL SPINE - 3 VIEW   COMPARISON:  None Available.   FINDINGS: No fracture, dislocation or subluxation. No spondylolisthesis. No osteolytic or osteoblastic changes. Prevertebral and cervical cranial soft tissues are unremarkable.   Degenerative disc disease noted with disc space narrowing and marginal osteophytes at C4-6.   IMPRESSION: Degenerative changes. No acute osseous abnormalities.   Electronically Signed   By: Layla Maw M.D.   On: 03/07/2023 13:52  PATIENT SURVEYS:  FOTO initial 68/ goal 74  COGNITION: Overall cognitive status: Within functional limits for tasks assessed     SENSATION: WFL  POSTURE: Slight rounded shoulder/ pec. tightness  UPPER EXTREMITY ROM:   Active ROM Right eval Left eval  Shoulder flexion 180 deg. 180 deg.  Shoulder extension    Shoulder abduction 170 deg. 170 deg.  Shoulder adduction    Shoulder internal rotation 75 deg. 75 deg.  Shoulder external rotation 80 deg. 80 deg.  Elbow flexion WNL WNL  Elbow extension WNL WNL  Wrist flexion    Wrist extension    Wrist ulnar deviation    Wrist radial deviation    Wrist pronation    Wrist supination    (Blank rows = not tested)  Cervical AROM: flexion 57 deg./ extension 66 deg./ L lat. Flex. 54 deg./ R lat. Flex. 44 deg. (R sided lipoma)/ L rotn. 70 deg./ R rotn. 67 deg.    UPPER EXTREMITY MMT:  MMT Right eval Left eval  Shoulder flexion 4 4  Shoulder extension    Shoulder abduction 4- 4-  Shoulder adduction    Shoulder internal rotation 4+ 4+  Shoulder external rotation 4 4  Middle trapezius 4 4  Lower trapezius 4 4  Elbow flexion 5 5  Elbow extension 5 5  Wrist flexion    Wrist extension    Wrist ulnar deviation    Wrist radial deviation    Wrist pronation    Wrist supination    Grip strength (lbs) 85.9# 81.3#  (Blank rows = not  tested)  SHOULDER SPECIAL TESTS: Impingement tests: Neer impingement test: negative and Hawkins/Kennedy impingement test: negative Rotator cuff assessment: Empty  can test: negative and Belly press test: negative  JOINT MOBILITY TESTING:  Good B shoulder capsular mobility (AP/PA/inf.).  No c/o pain.    PALPATION:  Negative   TODAY'S TREATMENT:                                                                                                                                         DATE: 06/11/2023  Subjective: Pt. Reports good compliance with HEP.  No c/o shoulder pain prior to tx. Session.     There.ex.:  Reviewed HEP  Nautilus ex. (Use of wand):  60# lat. Pull downs/ 40# tricep extension/ 50# scap. Retraction/ 40# chest press/ 40# sh. Extension/ 40# sh. Adduction with handles/ 30# bicep curls with handles 12x2.    Standing B shoulder flexion/ scaption 10x with good scapulohumeral rhythm.    B UBE 3 min. F/b.  Consistent cadence (no pain).    Prone plank with upward dog position (tricep weakness noted).     PATIENT EDUCATION: Education details: Access Code: [email or web address] Person educated: Patient Education method: Explanation, Demonstration, and Handouts Education comprehension: verbalized understanding and returned demonstration  HOME EXERCISE PROGRAM: Access Code: [email or web address] URL: [email or web address] Date: 06/04/2023 Prepared by: Dorene Grebe  Exercises - Standing Shoulder Flexion with Resistance  - 1 x daily - 4 x weekly - 2 sets - 15 reps - Standing Single Arm Shoulder Abduction with Resistance  - 1 x daily - 4 x weekly - 2 sets - 15 reps - Standing Shoulder External Rotation with Resistance  - 1 x daily - 4 x weekly - 2 sets - 15 reps  ASSESSMENT:  CLINICAL IMPRESSION: Pt. reports no shoulder pain prior to tx. Session.  Pt. Reports good compliance with HEP and active with daily tasks/ Yoga.   Pt. Has generalized B shoulder muscle weakness and demonstrates  limitations with yoga position/ upward dog.  Good body mechanics with resisted there.ex. at [employer].  Pt. Will benefit from skilled PT services to develop HEP to increase B shoulder strength to improve pain-free mobility with household tasks/ Yoga ex.    OBJECTIVE IMPAIRMENTS: decreased activity tolerance, decreased mobility, decreased strength, impaired UE functional use, improper body mechanics, postural dysfunction, and pain.   ACTIVITY LIMITATIONS: carrying, lifting, and locomotion level  PARTICIPATION LIMITATIONS: cleaning, interpersonal relationship, community activity, and occupation  PERSONAL FACTORS: Fitness and Past/current experiences are also affecting patient's functional outcome.   REHAB POTENTIAL: Good  CLINICAL DECISION MAKING: Stable/uncomplicated  EVALUATION COMPLEXITY: Low   GOALS: Goals reviewed with patient? Yes  SHORT TERM GOALS: Target date: 06/25/23  Pt. Will be independent with HEP to increase B UE muscle strength 1/2 muscle grade to improve pain-free mobility.   Baseline:  see above Goal status: INITIAL   LONG TERM GOALS: Target date: 07/16/23  Pt. Will increase FOTO to 74 to improve pain-free mobility.   Baseline: initial 68 Goal status: INITIAL  2.  Pt. Able to complete plank/ upward dog yoga poses with no shoulder pain/ limitations.  Baseline: pain limited/ weakness noted Goal status: INITIAL  3.  Pt. Will progress with B UE ex. Program with assist from personal trainer to maintain functional gains from PT ex. Program.   Baseline: pt. Hoping to work with Systems analyst.  Goal status: INITIAL  PLAN:  PT FREQUENCY: 1-2x/week  PT DURATION: 6 weeks  PLANNED INTERVENTIONS: 97110-Therapeutic exercises, 97530- Therapeutic activity, [id number]- Neuromuscular re-education, 97535- Self Care, [id number]- Manual therapy, 97014- Electrical stimulation (unattended), Taping, Dry Needling, Joint mobilization, Cryotherapy, and Moist heat  PLAN FOR NEXT SESSION:  Reassess HEP  Cammie Mcgee, PT, DPT # [id number] 06/11/2023, 1:36 PM

## 2023-06-18 ENCOUNTER — Ambulatory Visit: Payer: BC Managed Care – PPO | Attending: Family Medicine | Admitting: Physical Therapy

## 2023-06-18 ENCOUNTER — Encounter: Payer: Self-pay | Admitting: Physical Therapy

## 2023-06-18 DIAGNOSIS — M25562 Pain in left knee: Secondary | ICD-10-CM | POA: Insufficient documentation

## 2023-06-18 DIAGNOSIS — G8929 Other chronic pain: Secondary | ICD-10-CM | POA: Diagnosis not present

## 2023-06-18 DIAGNOSIS — M6281 Muscle weakness (generalized): Secondary | ICD-10-CM | POA: Diagnosis not present

## 2023-06-18 DIAGNOSIS — M25511 Pain in right shoulder: Secondary | ICD-10-CM | POA: Diagnosis not present

## 2023-06-18 DIAGNOSIS — M5441 Lumbago with sciatica, right side: Secondary | ICD-10-CM | POA: Diagnosis not present

## 2023-06-18 DIAGNOSIS — M25512 Pain in left shoulder: Secondary | ICD-10-CM | POA: Insufficient documentation

## 2023-06-18 NOTE — Therapy (Signed)
OUTPATIENT PHYSICAL THERAPY SHOULDER TREATMENT  Patient Name: Daniel Lawrence MRN: [id number] DOB:08-Feb-1961, 62 y.o., male Today's Date: 06/18/2023  END OF SESSION:  PT End of Session - 06/18/23 0729     Visit Number 3    Number of Visits 12    Date for PT Re-Evaluation 07/16/23    PT Start Time 0729    PT Stop Time 0824    PT Time Calculation (min) 55 min             Past Medical History:  Diagnosis Date   Allergy    seasonal   Arthritis    left knee   Cancer (HCC)    skin cancer   Cataract    Hyperlipidemia    Hypertension    Past Surgical History:  Procedure Laterality Date   BRAIN SURGERY  1971   COLONOSCOPY  05/03/2021   ELEVATION OF DEPRESSED SKULL FRACTURE  07/16/1969   traumatic blow to head by golf club   , [facility])   MOHS SURGERY  09/14/2011   left temple, squamous   ROBOT ASSISTED LAPAROSCOPIC NEPHRECTOMY Right 04/20/2016   Procedure: XI ROBOTIC ASSISTED LAPAROSCOPIC RADICAL NEPHRECTOMY;  Surgeon: Sebastian Ache, MD;  Location: [facility];  Service: Urology;  Laterality: Right;   Patient Active Problem List   Diagnosis Date Noted   Tubular adenoma of colon 05/29/2023   Hearing loss due to cerumen impaction, right 05/29/2023   AC (acromioclavicular) arthritis 02/27/2023   Increased prostate specific antigen (PSA) velocity 07/03/2022   Lipoma of right shoulder 05/21/2022   Low back pain 09/15/2021   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 07/22/2021   Aortic atherosclerosis (HCC) 04/18/2021   Melanoma in situ of right upper arm (HCC) 04/18/2021   H/O right nephrectomy 04/17/2020   Degenerative arthritis of left knee 02/25/2018   Chronic pain of left knee 02/08/2018   CKD (chronic kidney disease) stage 3, GFR 30-59 ml/min (HCC) 02/08/2018   Coronary atherosclerosis due to lipid rich plaque 05/26/2016   History of renal cell carcinoma 04/20/2016   B12 deficiency 04/08/2016   Encounter for preventive health  examination 06/24/2014   Hypertension 06/23/2014   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 Reports an increase in R shoulder pain while wringing out of wash cloth and heard R shoulder pop.  Pt. States this happened 6 months ago.  Pt. C/o weakness in B shoulders and limited with Yoga positions.  Pt. Received injections in B shoulders with benefit.  Pt. States "I feel weak in both shoulders".    Hand dominance: Right  PERTINENT HISTORY: Pt. Known well to PT clinic.  See previous PT notes/ MD notes.   PAIN:  Are you having pain? No c/o shoulder pain at time of evaluation.   PRECAUTIONS: None  RED FLAGS: None   WEIGHT BEARING RESTRICTIONS: No  FALLS:  Has patient fallen in last 6 months? No  LIVING ENVIRONMENT: Lives with: lives with their spouse Lives in: House/apartment Has following equipment at home: None  OCCUPATION: Research scientist (medical)  PLOF: Independent  PATIENT GOALS:  Increase B shoulder strength/ get referral to personal trainer   NEXT MD VISIT: TBD  OBJECTIVE:  Note: Objective measures were completed at Evaluation unless otherwise noted.  DIAGNOSTIC FINDINGS:  FINDINGS: Mild glenohumeral degenerative change without loss of joint space. No other bony or soft tissue abnormalities identified.   IMPRESSION: Mild glenohumeral degenerative  change without loss of joint space.     Electronically Signed   By: Gerome [name] III M.D.   On: 12/28/2022 09:37  CERVICAL SPINE - 3 VIEW   COMPARISON:  None Available.   FINDINGS: No fracture, dislocation or subluxation. No spondylolisthesis. No osteolytic or osteoblastic changes. Prevertebral and cervical cranial soft tissues are unremarkable.   Degenerative disc disease noted with disc space narrowing and marginal osteophytes at C4-6.   IMPRESSION: Degenerative changes. No acute osseous abnormalities.   Electronically Signed   By: Layla Maw M.D.   On: 03/07/2023 13:52  PATIENT SURVEYS:  FOTO initial 68/ goal 74  COGNITION: Overall cognitive status: Within functional limits for tasks assessed     SENSATION: WFL  POSTURE: Slight rounded shoulder/ pec. tightness  UPPER EXTREMITY ROM:   Active ROM Right eval Left eval  Shoulder flexion 180 deg. 180 deg.  Shoulder extension    Shoulder abduction 170 deg. 170 deg.  Shoulder adduction    Shoulder internal rotation 75 deg. 75 deg.  Shoulder external rotation 80 deg. 80 deg.  Elbow flexion WNL WNL  Elbow extension WNL WNL  Wrist flexion    Wrist extension    Wrist ulnar deviation    Wrist radial deviation    Wrist pronation    Wrist supination    (Blank rows = not tested)  Cervical AROM: flexion 57 deg./ extension 66 deg./ L lat. Flex. 54 deg./ R lat. Flex. 44 deg. (R sided lipoma)/ L rotn. 70 deg./ R rotn. 67 deg.    UPPER EXTREMITY MMT:  MMT Right eval Left eval  Shoulder flexion 4 4  Shoulder extension    Shoulder abduction 4- 4-  Shoulder adduction    Shoulder internal rotation 4+ 4+  Shoulder external rotation 4 4  Middle trapezius 4 4  Lower trapezius 4 4  Elbow flexion 5 5  Elbow extension 5 5  Wrist flexion    Wrist extension    Wrist ulnar deviation    Wrist radial deviation    Wrist pronation    Wrist supination    Grip strength (lbs) 85.9# 81.3#  (Blank rows = not  tested)  SHOULDER SPECIAL TESTS: Impingement tests: Neer impingement test: negative and Hawkins/Kennedy impingement test: negative Rotator cuff assessment: Empty  can test: negative and Belly press test: negative  JOINT MOBILITY TESTING:  Good B shoulder capsular mobility (AP/PA/inf.).  No c/o pain.    PALPATION:  Negative   TODAY'S TREATMENT:                                                                                                                                         DATE: 06/18/2023  Subjective: Pt. Reports good compliance with HEP.  Pt. Had muscle soreness after last tx. Session.  No c/o shoulder pain prior to tx. Session.  Pt. Able to hit golf balls at [employer] with no shoulder issues reported.      There.ex.:  B UBE 3 min. F/b.  Consistent cadence (no pain).    Nautilus ex. (Use of wand):  60# lat. Pull downs/ 50# tricep extension/ 50# scap. Retraction/ 50# chest press/ 40# sh. Adduction with handles/ 40# bicep curls with wand 15x2.    Standing 5# D1/2 flexion 10x2 with mirror feedback.  Muscle fatigue reported.      Standing B shoulder flexion/ scaption 10x with good scapulohumeral rhythm.    Supine 5# serratus punches 10x.  Supine L/R shoulder rhythmic stabs 5# wt. 3x20 sec. Each.   Standing shoulder ER/flexion with GTB at wall 10x.    Pt. Instructed to not complete HEP tomorrow and will return to PT on Thursday for progressive HEP.      PATIENT EDUCATION: Education details: Access Code: [email or web address] Person educated: Patient Education method: Explanation, Demonstration, and Handouts Education comprehension: verbalized understanding and returned demonstration  HOME EXERCISE PROGRAM: Access Code: [email or web address] URL: [email or web address] Date: 06/04/2023 Prepared by: Dorene Grebe  Exercises - Standing Shoulder Flexion with Resistance  - 1 x daily - 4 x weekly - 2 sets - 15 reps - Standing Single Arm Shoulder Abduction with Resistance  - 1 x daily - 4 x  weekly - 2 sets - 15 reps - Standing Shoulder External Rotation with Resistance  - 1 x daily - 4 x weekly - 2 sets - 15 reps  ASSESSMENT:  CLINICAL IMPRESSION: Pt. reports no shoulder pain prior to tx. Session.  Pt. Reports good compliance with HEP and active with daily tasks/ Yoga.   Pt. Has generalized B shoulder muscle weakness and discomfort while reaching back to put on jacket.  Good body mechanics with resisted there.ex. at N[employer] and marked muscle fatigue noted after tx. Session today.  Pt. Will benefit from skilled PT services to develop HEP to increase B shoulder strength to improve pain-free mobility with household tasks/ Yoga ex.    OBJECTIVE IMPAIRMENTS: decreased activity tolerance, decreased mobility, decreased strength, impaired UE functional use, improper body mechanics, postural dysfunction, and pain.   ACTIVITY LIMITATIONS: carrying, lifting, and locomotion level  PARTICIPATION LIMITATIONS: cleaning, interpersonal relationship, community activity, and occupation  PERSONAL FACTORS: Fitness and Past/current experiences are also affecting patient's functional outcome.   REHAB POTENTIAL: Good  CLINICAL DECISION MAKING: Stable/uncomplicated  EVALUATION COMPLEXITY: Low   GOALS: Goals reviewed with patient? Yes  SHORT TERM GOALS: Target date: 06/25/23  Pt. Will be independent with HEP to increase B UE muscle strength 1/2 muscle grade to improve pain-free mobility.   Baseline:  see above Goal status: INITIAL   LONG TERM GOALS: Target date: 07/16/23  Pt. Will increase FOTO to 74 to improve pain-free mobility.   Baseline: initial 68 Goal status: INITIAL  2.  Pt. Able to complete plank/ upward dog yoga poses with no shoulder pain/ limitations.  Baseline: pain limited/ weakness noted Goal status: INITIAL  3.  Pt. Will progress with B UE ex. Program with assist from personal trainer to maintain functional gains from PT ex. Program.   Baseline: pt. Hoping to work  with Systems analyst.  Goal status: INITIAL  PLAN:  PT FREQUENCY: 1-2x/week  PT DURATION: 6 weeks  PLANNED INTERVENTIONS: 97110-Therapeutic exercises, 97530- Therapeutic activity, [id number]- Neuromuscular re-education, 97535- Self Care, 25366- Manual therapy, 97014- Electrical stimulation (unattended), Taping, Dry Needling, Joint mobilization, Cryotherapy, and Moist heat  PLAN FOR NEXT SESSION: Issue new progressive shoulder ex.  Cammie Mcgee, PT, DPT # [id number] 06/18/2023, 8:24 AM

## 2023-06-20 ENCOUNTER — Ambulatory Visit: Payer: BC Managed Care – PPO | Admitting: Physical Therapy

## 2023-06-20 ENCOUNTER — Encounter: Payer: Self-pay | Admitting: Physical Therapy

## 2023-06-20 DIAGNOSIS — M25562 Pain in left knee: Secondary | ICD-10-CM | POA: Diagnosis not present

## 2023-06-20 DIAGNOSIS — M25512 Pain in left shoulder: Secondary | ICD-10-CM | POA: Diagnosis not present

## 2023-06-20 DIAGNOSIS — M6281 Muscle weakness (generalized): Secondary | ICD-10-CM

## 2023-06-20 DIAGNOSIS — G8929 Other chronic pain: Secondary | ICD-10-CM

## 2023-06-20 DIAGNOSIS — M25511 Pain in right shoulder: Secondary | ICD-10-CM | POA: Diagnosis not present

## 2023-06-20 DIAGNOSIS — M5441 Lumbago with sciatica, right side: Secondary | ICD-10-CM | POA: Diagnosis not present

## 2023-06-20 NOTE — Therapy (Signed)
OUTPATIENT PHYSICAL THERAPY SHOULDER TREATMENT  Patient Name: Daniel Lawrence MRN: [id number] DOB:09/05/1960, 62 y.o., male Today's Date: 06/20/2023  END OF SESSION:  PT End of Session - 06/20/23 0721     Visit Number 4    Number of Visits 12    Date for PT Re-Evaluation 07/16/23    PT Start Time 0726    PT Stop Time 0819    PT Time Calculation (min) 53 min             Past Medical History:  Diagnosis Date   Allergy    seasonal   Arthritis    left knee   Cancer (HCC)    skin cancer   Cataract    Hyperlipidemia    Hypertension    Past Surgical History:  Procedure Laterality Date   BRAIN SURGERY  1971   COLONOSCOPY  05/03/2021   ELEVATION OF DEPRESSED SKULL FRACTURE  07/16/1969   traumatic blow to head by golf club   , [facility])   MOHS SURGERY  09/14/2011   left temple, squamous   ROBOT ASSISTED LAPAROSCOPIC NEPHRECTOMY Right 04/20/2016   Procedure: XI ROBOTIC ASSISTED LAPAROSCOPIC RADICAL NEPHRECTOMY;  Surgeon: Sebastian Ache, MD;  Location: [facility];  Service: Urology;  Laterality: Right;   Patient Active Problem List   Diagnosis Date Noted   Tubular adenoma of colon 05/29/2023   Hearing loss due to cerumen impaction, right 05/29/2023   AC (acromioclavicular) arthritis 02/27/2023   Increased prostate specific antigen (PSA) velocity 07/03/2022   Lipoma of right shoulder 05/21/2022   Low back pain 09/15/2021   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 07/22/2021   Aortic atherosclerosis (HCC) 04/18/2021   Melanoma in situ of right upper arm (HCC) 04/18/2021   H/O right nephrectomy 04/17/2020   Degenerative arthritis of left knee 02/25/2018   Chronic pain of left knee 02/08/2018   CKD (chronic kidney disease) stage 3, GFR 30-59 ml/min (HCC) 02/08/2018   Coronary atherosclerosis due to lipid rich plaque 05/26/2016   History of renal cell carcinoma 04/20/2016   B12 deficiency 04/08/2016   Encounter for preventive health  examination 06/24/2014   Hypertension 06/23/2014   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 Reports an increase in R shoulder pain while wringing out of wash cloth and heard R shoulder pop.  Pt. States this happened 6 months ago.  Pt. C/o weakness in B shoulders and limited with Yoga positions.  Pt. Received injections in B shoulders with benefit.  Pt. States "I feel weak in both shoulders".    Hand dominance: Right  PERTINENT HISTORY: Pt. Known well to PT clinic.  See previous PT notes/ MD notes.   PAIN:  Are you having pain? No c/o shoulder pain at time of evaluation.   PRECAUTIONS: None  RED FLAGS: None   WEIGHT BEARING RESTRICTIONS: No  FALLS:  Has patient fallen in last 6 months? No  LIVING ENVIRONMENT: Lives with: lives with their spouse Lives in: House/apartment Has following equipment at home: None  OCCUPATION: Research scientist (medical)  PLOF: Independent  PATIENT GOALS:  Increase B shoulder strength/ get referral to personal trainer   NEXT MD VISIT: TBD  OBJECTIVE:  Note: Objective measures were completed at Evaluation unless otherwise noted.  DIAGNOSTIC FINDINGS:  FINDINGS: Mild glenohumeral degenerative change without loss of joint space. No other bony or soft tissue abnormalities identified.   IMPRESSION: Mild glenohumeral degenerative  change without loss of joint space.     Electronically Signed   By: Gerome [name] III M.D.   On: 12/28/2022 09:37  CERVICAL SPINE - 3 VIEW   COMPARISON:  None Available.   FINDINGS: No fracture, dislocation or subluxation. No spondylolisthesis. No osteolytic or osteoblastic changes. Prevertebral and cervical cranial soft tissues are unremarkable.   Degenerative disc disease noted with disc space narrowing and marginal osteophytes at C4-6.   IMPRESSION: Degenerative changes. No acute osseous abnormalities.   Electronically Signed   By: Layla Maw M.D.   On: 03/07/2023 13:52  PATIENT SURVEYS:  FOTO initial 68/ goal 74  COGNITION: Overall cognitive status: Within functional limits for tasks assessed     SENSATION: WFL  POSTURE: Slight rounded shoulder/ pec. tightness  UPPER EXTREMITY ROM:   Active ROM Right eval Left eval  Shoulder flexion 180 deg. 180 deg.  Shoulder extension    Shoulder abduction 170 deg. 170 deg.  Shoulder adduction    Shoulder internal rotation 75 deg. 75 deg.  Shoulder external rotation 80 deg. 80 deg.  Elbow flexion WNL WNL  Elbow extension WNL WNL  Wrist flexion    Wrist extension    Wrist ulnar deviation    Wrist radial deviation    Wrist pronation    Wrist supination    (Blank rows = not tested)  Cervical AROM: flexion 57 deg./ extension 66 deg./ L lat. Flex. 54 deg./ R lat. Flex. 44 deg. (R sided lipoma)/ L rotn. 70 deg./ R rotn. 67 deg.    UPPER EXTREMITY MMT:  MMT Right eval Left eval  Shoulder flexion 4 4  Shoulder extension    Shoulder abduction 4- 4-  Shoulder adduction    Shoulder internal rotation 4+ 4+  Shoulder external rotation 4 4  Middle trapezius 4 4  Lower trapezius 4 4  Elbow flexion 5 5  Elbow extension 5 5  Wrist flexion    Wrist extension    Wrist ulnar deviation    Wrist radial deviation    Wrist pronation    Wrist supination    Grip strength (lbs) 85.9# 81.3#  (Blank rows = not  tested)  SHOULDER SPECIAL TESTS: Impingement tests: Neer impingement test: negative and Hawkins/Kennedy impingement test: negative Rotator cuff assessment: Empty  can test: negative and Belly press test: negative  JOINT MOBILITY TESTING:  Good B shoulder capsular mobility (AP/PA/inf.).  No c/o pain.    PALPATION:  Negative   TODAY'S TREATMENT:                                                                                                                                         DATE: 06/20/2023  Subjective: Pt. Had some muscle soreness after last tx. Session.  No c/o shoulder pain prior to tx. Session today.  Pt. Remains active with HEP and Yoga.    There.ex.:  B UBE 3 min. F/b.  Consistent cadence (no pain).    Standing B shoulder flexion/ scaption/ IR/ ER 5x with good scapulohumeral rhythm.  Full AROM all planes.    Prone on blue ball: Y's, T's and I's.  10x2 (no wts./ marked fatigue noted).  Good technique.     Nautilus ex. (Use of wand):  60# seated lat. Pull downs/ 50# standing tricep extension/ 60# scap. Retraction/ 50# chest press/ 50# shoulder extension/ 40# sh. Adduction with handles 15x2.    Supine 5# serratus punches 10x.  Supine L/R shoulder rhythmic stabs 5# wt. 3x20 sec. Each.   Standing white ball shoulder flexion to end-range shoulder flexion/ added bouncing with bilateral/ unilateral UE.  5x each for 20 sec.   See updated HEP    PATIENT EDUCATION: Education details: Access Code: [email or web address] Person educated: Patient Education method: Explanation, Demonstration, and Handouts Education comprehension: verbalized understanding and returned demonstration  HOME EXERCISE PROGRAM: Access Code: [email or web address] URL: [email or web address] Date: 06/04/2023 Prepared by: Dorene Grebe  Exercises - Standing Shoulder Flexion with Resistance  - 1 x daily - 4 x weekly - 2 sets - 15 reps - Standing Single Arm Shoulder Abduction with Resistance  - 1 x daily - 4 x weekly -  2 sets - 15 reps - Standing Shoulder External Rotation with Resistance  - 1 x daily - 4 x weekly - 2 sets - 15 reps  Access Code: [email or web address] URL: [email or web address] Date: 06/20/2023 Prepared by: Dorene Grebe  Exercises - Standing Shoulder Flexion with Resistance  - 1 x daily - 4 x weekly - 2 sets - 15 reps - Standing Single Arm Shoulder Abduction with Resistance  - 1 x daily - 4 x weekly - 2 sets - 15 reps - Standing Shoulder External Rotation with Resistance  - 1 x daily - 4 x weekly - 2 sets - 15 reps - Scapular Retraction with Resistance  - 1 x daily - 4 x weekly - 2 sets - 15 reps - Standing Tricep Extensions with Resistance  - 1 x daily - 4 x weekly - 2 sets - 15 reps - Prone Scapular Retraction Arms at Side  - 1 x daily - 4 x weekly - 2 sets - 10 reps - Prone Scapular Retraction Y  - 1 x daily -  4 x weekly - 2 sets - 10 reps  ASSESSMENT:  CLINICAL IMPRESSION: Pt. reports no shoulder pain prior to tx. Session.  Pt. Reports good compliance with HEP and active with daily tasks/ Yoga.   Pt. Has generalized B shoulder muscle weakness and discomfort while reaching back to put on jacket.  Good body mechanics with resisted there.ex. at [employer] and marked muscle fatigue noted after tx. Session today.  PT discussed joining [employer] for more gym based ex.   Pt. Will benefit from skilled PT services to develop HEP to increase B shoulder strength to improve pain-free mobility with household tasks/ Yoga ex.    OBJECTIVE IMPAIRMENTS: decreased activity tolerance, decreased mobility, decreased strength, impaired UE functional use, improper body mechanics, postural dysfunction, and pain.   ACTIVITY LIMITATIONS: carrying, lifting, and locomotion level  PARTICIPATION LIMITATIONS: cleaning, interpersonal relationship, community activity, and occupation  PERSONAL FACTORS: Fitness and Past/current experiences are also affecting patient's functional outcome.   REHAB POTENTIAL:  Good  CLINICAL DECISION MAKING: Stable/uncomplicated  EVALUATION COMPLEXITY: Low   GOALS: Goals reviewed with patient? Yes  SHORT TERM GOALS: Target date: 06/25/23  Pt. Will be independent with HEP to increase B UE muscle strength 1/2 muscle grade to improve pain-free mobility.   Baseline:  see above Goal status: INITIAL   LONG TERM GOALS: Target date: 07/16/23  Pt. Will increase FOTO to 74 to improve pain-free mobility.   Baseline: initial 68 Goal status: INITIAL  2.  Pt. Able to complete plank/ upward dog yoga poses with no shoulder pain/ limitations.  Baseline: pain limited/ weakness noted Goal status: INITIAL  3.  Pt. Will progress with B UE ex. Program with assist from personal trainer to maintain functional gains from PT ex. Program.   Baseline: pt. Hoping to work with Systems analyst.  Goal status: INITIAL  PLAN:  PT FREQUENCY: 1-2x/week  PT DURATION: 6 weeks  PLANNED INTERVENTIONS: 97110-Therapeutic exercises, 97530- Therapeutic activity, [id number]- Neuromuscular re-education, 97535- Self Care, 65784- Manual therapy, 97014- Electrical stimulation (unattended), Taping, Dry Needling, Joint mobilization, Cryotherapy, and Moist heat  PLAN FOR NEXT SESSION: Add bodyblade ex./ [employer] ex.  Cammie Mcgee, PT, DPT # [id number] 06/20/2023, 8:19 AM

## 2023-06-25 ENCOUNTER — Ambulatory Visit: Payer: BC Managed Care – PPO | Admitting: Physical Therapy

## 2023-06-25 DIAGNOSIS — M6281 Muscle weakness (generalized): Secondary | ICD-10-CM

## 2023-06-25 DIAGNOSIS — M25562 Pain in left knee: Secondary | ICD-10-CM | POA: Diagnosis not present

## 2023-06-25 DIAGNOSIS — M25512 Pain in left shoulder: Secondary | ICD-10-CM | POA: Diagnosis not present

## 2023-06-25 DIAGNOSIS — G8929 Other chronic pain: Secondary | ICD-10-CM | POA: Diagnosis not present

## 2023-06-25 DIAGNOSIS — M25511 Pain in right shoulder: Secondary | ICD-10-CM | POA: Diagnosis not present

## 2023-06-25 DIAGNOSIS — M5441 Lumbago with sciatica, right side: Secondary | ICD-10-CM | POA: Diagnosis not present

## 2023-06-25 NOTE — Therapy (Signed)
OUTPATIENT PHYSICAL THERAPY SHOULDER TREATMENT  Patient Name: Daniel Lawrence MRN: [id number] DOB:April 25, 1961, 62 y.o., male Today's Date: 06/25/2023  END OF SESSION:  PT End of Session - 06/25/23 0733     Visit Number 5    Number of Visits 12    Date for PT Re-Evaluation 07/16/23    PT Start Time 0733    PT Stop Time 0822    PT Time Calculation (min) 49 min    Activity Tolerance Patient tolerated treatment well    Behavior During Therapy [facility] for tasks assessed/performed             Past Medical History:  Diagnosis Date   Allergy    seasonal   Arthritis    left knee   Cancer (HCC)    skin cancer   Cataract    Hyperlipidemia    Hypertension    Past Surgical History:  Procedure Laterality Date   BRAIN SURGERY  1971   COLONOSCOPY  05/03/2021   ELEVATION OF DEPRESSED SKULL FRACTURE  07/16/1969   traumatic blow to head by golf club   , [facility])   MOHS SURGERY  09/14/2011   left temple, squamous   ROBOT ASSISTED LAPAROSCOPIC NEPHRECTOMY Right 04/20/2016   Procedure: XI ROBOTIC ASSISTED LAPAROSCOPIC RADICAL NEPHRECTOMY;  Surgeon: Sebastian Ache, MD;  Location: [facility];  Service: Urology;  Laterality: Right;   Patient Active Problem List   Diagnosis Date Noted   Tubular adenoma of colon 05/29/2023   Hearing loss due to cerumen impaction, right 05/29/2023   AC (acromioclavicular) arthritis 02/27/2023   Increased prostate specific antigen (PSA) velocity 07/03/2022   Lipoma of right shoulder 05/21/2022   Low back pain 09/15/2021   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 07/22/2021   Aortic atherosclerosis (HCC) 04/18/2021   Melanoma in situ of right upper arm (HCC) 04/18/2021   H/O right nephrectomy 04/17/2020   Degenerative arthritis of left knee 02/25/2018   Chronic pain of left knee 02/08/2018   CKD (chronic kidney disease) stage 3, GFR 30-59 ml/min (HCC) 02/08/2018   Coronary atherosclerosis due to lipid rich plaque  05/26/2016   History of renal cell carcinoma 04/20/2016   B12 deficiency 04/08/2016   Encounter for preventive health examination 06/24/2014   Hypertension 06/23/2014   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 Reports an increase in R shoulder pain while wringing out of wash cloth and heard R shoulder pop.  Pt. States this happened 6 months ago.  Pt. C/o weakness in B shoulders and limited with Yoga positions.  Pt. Received injections in B shoulders with benefit.  Pt. States "I feel weak in both shoulders".    Hand dominance: Right  PERTINENT HISTORY: Pt. Known well to PT clinic.  See previous PT notes/ MD notes.   PAIN:  Are you having pain? No c/o shoulder pain at time of evaluation.   PRECAUTIONS: None  RED FLAGS: None   WEIGHT BEARING RESTRICTIONS: No  FALLS:  Has patient fallen in last 6 months? No  LIVING ENVIRONMENT: Lives with: lives with their spouse Lives in: House/apartment Has following equipment at home: None  OCCUPATION: Research scientist (medical)  PLOF: Independent  PATIENT GOALS:  Increase B shoulder strength/ get referral to personal trainer   NEXT MD VISIT: TBD  OBJECTIVE:  Note: Objective measures were completed at Evaluation unless otherwise noted.  DIAGNOSTIC FINDINGS:  FINDINGS: Mild glenohumeral degenerative change without loss of  joint space. No other bony or soft tissue abnormalities identified.   IMPRESSION: Mild glenohumeral degenerative change without loss of joint space.     Electronically Signed   By: Gerome [name] III M.D.   On: 12/28/2022 09:37  CERVICAL SPINE - 3 VIEW   COMPARISON:  None Available.   FINDINGS: No fracture, dislocation or subluxation. No spondylolisthesis. No osteolytic or osteoblastic changes. Prevertebral and cervical cranial soft tissues are unremarkable.   Degenerative disc disease noted with disc space narrowing and marginal osteophytes at C4-6.   IMPRESSION: Degenerative changes. No acute osseous abnormalities.   Electronically Signed   By: Layla Maw M.D.   On: 03/07/2023 13:52  PATIENT SURVEYS:  FOTO initial 68/ goal 74  COGNITION: Overall cognitive status: Within functional limits for tasks assessed     SENSATION: WFL  POSTURE: Slight rounded shoulder/ pec. tightness  UPPER EXTREMITY ROM:   Active ROM Right eval Left eval  Shoulder flexion 180 deg. 180 deg.  Shoulder extension    Shoulder abduction 170 deg. 170 deg.  Shoulder adduction    Shoulder internal rotation 75 deg. 75 deg.  Shoulder external rotation 80 deg. 80 deg.  Elbow flexion WNL WNL  Elbow extension WNL WNL  Wrist flexion    Wrist extension    Wrist ulnar deviation    Wrist radial deviation    Wrist pronation    Wrist supination    (Blank rows = not tested)  Cervical AROM: flexion 57 deg./ extension 66 deg./ L lat. Flex. 54 deg./ R lat. Flex. 44 deg. (R sided lipoma)/ L rotn. 70 deg./ R rotn. 67 deg.    UPPER EXTREMITY MMT:  MMT Right eval Left eval  Shoulder flexion 4 4  Shoulder extension    Shoulder abduction 4- 4-  Shoulder adduction    Shoulder internal rotation 4+ 4+  Shoulder external rotation 4 4  Middle trapezius 4 4  Lower trapezius 4 4  Elbow flexion 5 5  Elbow extension 5 5  Wrist flexion    Wrist extension    Wrist ulnar deviation    Wrist  radial deviation    Wrist pronation    Wrist supination    Grip strength (lbs) 85.9# 81.3#  (Blank rows = not tested)  SHOULDER SPECIAL TESTS: Impingement tests: Neer impingement test: negative and Hawkins/Kennedy impingement test: negative Rotator cuff assessment: Empty  can test: negative and Belly press test: negative  JOINT MOBILITY TESTING:  Good B shoulder capsular mobility (AP/PA/inf.).  No c/o pain.    PALPATION:  Negative   TODAY'S TREATMENT:                                                                                                                                         DATE: 06/25/2023  Subjective: Patient reports no recent flare-up of shoulder pain. Patient reports no notable symptoms at arrival. Patient reports tolerating yoga instruction/positions well. Pt reports difficulty with push-ups and some triceps weakness.    There.ex.:  B UBE 3 min. F/b.  Consistent cadence (no pain).    -subjective gathered for 3 minutes of time on UBE, 3 minutes unbilled    Standing B shoulder flexion/ scaption/ IR/ ER 5x with good scapulohumeral rhythm.  Full AROM all planes.    Modified plank, edge of table; alternating shoulder tap; 2x10  Prone Swimmer (overhead press with maintenance of scap retraction); on table; 2x8  Nautilus ex. (Use of bilateral single handles):  60# standing lat. Pull downs/  60# scap. Retraction/ 50# chest press/ 50# shoulder extension/  15x2.    Bodyblade oscillations; 90 deg flexion and abduction; 1 x 30 sec each with either shoulder   -increase volume next visit   PATIENT EDUCATION: Discussed with patient use of closed-chain exercise to improve scapulothoracic and shoulder complex stability. We reviewed use of single-handle cable exercises for carryover to commercial gym setting. We discussed goals of PT and transitioning to gym-based program as pt approaches end of POC.     *not today* Prone on blue ball: Y's, T's and I's.  10x2 (no wts./ marked  fatigue noted).  Good technique.    Nautilus 50# standing tricep extension/40# sh. Adduction with handles Supine 5# serratus punches 10x.  Supine L/R shoulder rhythmic stabs 5# wt. 3x20 sec. Each. Standing white ball shoulder flexion to end-range shoulder flexion/ added bouncing with bilateral/ unilateral UE.  5x each for 20 sec.    PATIENT EDUCATION: Education details: Access Code: [email or web address] Person educated: Patient Education method: Explanation, Demonstration, and Handouts Education comprehension: verbalized understanding and returned demonstration  HOME EXERCISE PROGRAM: Access Code: [email or web address] URL: [email or web address] Date: 06/04/2023 Prepared by: Dorene Grebe  Exercises - Standing Shoulder Flexion with Resistance  - 1 x daily - 4 x weekly - 2 sets - 15 reps - Standing Single Arm Shoulder Abduction with Resistance  - 1 x daily - 4 x weekly - 2 sets - 15 reps - Standing Shoulder External Rotation with Resistance  - 1 x daily - 4 x weekly - 2 sets - 15 reps  Access Code: [email or web address] URL: [email or web address] Date: 06/20/2023 Prepared by: Dorene Grebe  Exercises - Standing Shoulder Flexion with Resistance  - 1 x daily - 4 x weekly - 2 sets - 15 reps - Standing Single Arm Shoulder Abduction with  Resistance  - 1 x daily - 4 x weekly - 2 sets - 15 reps - Standing Shoulder External Rotation with Resistance  - 1 x daily - 4 x weekly - 2 sets - 15 reps - Scapular Retraction with Resistance  - 1 x daily - 4 x weekly - 2 sets - 15 reps - Standing Tricep Extensions with Resistance  - 1 x daily - 4 x weekly - 2 sets - 15 reps - Prone Scapular Retraction Arms at Side  - 1 x daily - 4 x weekly - 2 sets - 10 reps - Prone Scapular Retraction Y  - 1 x daily - 4 x weekly - 2 sets - 10 reps  ASSESSMENT:  CLINICAL IMPRESSION: Patient fortunately demonstrates good ROM in all planes of motion performed today; he reports some intermittent difficulty with shoulder  hyperextension when donning jackets/shirts. Patient is able to continue with scapular stabilization and closed-chain upper limb loading to promote stability. We also progressed to BodyBlade per primary PT's plan with noted fatigue at end of session, but no increase in symptoms. Per discussion with pt, we will likely transition to gym-based setting toward completion of PT POC in January. We worked on bilateral single handles for [employer] today for carryover to [employer] setting.  Pt. Will benefit from skilled PT services to develop HEP to increase B shoulder strength to improve pain-free mobility with household tasks/ Yoga ex.    OBJECTIVE IMPAIRMENTS: decreased activity tolerance, decreased mobility, decreased strength, impaired UE functional use, improper body mechanics, postural dysfunction, and pain.   ACTIVITY LIMITATIONS: carrying, lifting, and locomotion level  PARTICIPATION LIMITATIONS: cleaning, interpersonal relationship, community activity, and occupation  PERSONAL FACTORS: Fitness and Past/current experiences are also affecting patient's functional outcome.   REHAB POTENTIAL: Good  CLINICAL DECISION MAKING: Stable/uncomplicated  EVALUATION COMPLEXITY: Low   GOALS: Goals reviewed with patient? Yes  SHORT TERM GOALS: Target date: 06/25/23  Pt. Will be independent with HEP to increase B UE muscle strength 1/2 muscle grade to improve pain-free mobility.   Baseline:  see above Goal status: INITIAL   LONG TERM GOALS: Target date: 07/16/23  Pt. Will increase FOTO to 74 to improve pain-free mobility.   Baseline: initial 68 Goal status: INITIAL  2.  Pt. Able to complete plank/ upward dog yoga poses with no shoulder pain/ limitations.  Baseline: pain limited/ weakness noted Goal status: INITIAL  3.  Pt. Will progress with B UE ex. Program with assist from personal trainer to maintain functional gains from PT ex. Program.   Baseline: pt. Hoping to work with Patent examiner.  Goal status: INITIAL  PLAN:  PT FREQUENCY: 1-2x/week  PT DURATION: 6 weeks  PLANNED INTERVENTIONS: 97110-Therapeutic exercises, 97530- Therapeutic activity, [id number]- Neuromuscular re-education, 97535- Self Care, 16109- Manual therapy, 97014- Electrical stimulation (unattended), Taping, Dry Needling, Joint mobilization, Cryotherapy, and Moist heat  PLAN FOR NEXT SESSION: Progress with BodyBlade drills, shoulder stabilization. Pt education on carryover to commercial gym setting.     Consuela Mimes, PT, DPT [phone] 06/25/2023, 9:38 AM

## 2023-06-26 NOTE — Therapy (Signed)
OUTPATIENT PHYSICAL THERAPY SHOULDER TREATMENT  Patient Name: Daniel Lawrence MRN: [id number] DOB:1961-02-12, 62 y.o., male Today's Date: 06/27/2023  END OF SESSION:  PT End of Session - 06/27/23 0732     Visit Number 6    Number of Visits 12    Date for PT Re-Evaluation 07/16/23    PT Start Time 0732    PT Stop Time 0814    PT Time Calculation (min) 42 min    Activity Tolerance Patient tolerated treatment well    Behavior During Therapy [facility] for tasks assessed/performed              Past Medical History:  Diagnosis Date   Allergy    seasonal   Arthritis    left knee   Cancer (HCC)    skin cancer   Cataract    Hyperlipidemia    Hypertension    Past Surgical History:  Procedure Laterality Date   BRAIN SURGERY  1971   COLONOSCOPY  05/03/2021   ELEVATION OF DEPRESSED SKULL FRACTURE  07/16/1969   traumatic blow to head by golf club   , [facility])   MOHS SURGERY  09/14/2011   left temple, squamous   ROBOT ASSISTED LAPAROSCOPIC NEPHRECTOMY Right 04/20/2016   Procedure: XI ROBOTIC ASSISTED LAPAROSCOPIC RADICAL NEPHRECTOMY;  Surgeon: Sebastian Ache, MD;  Location: [facility];  Service: Urology;  Laterality: Right;   Patient Active Problem List   Diagnosis Date Noted   Tubular adenoma of colon 05/29/2023   Hearing loss due to cerumen impaction, right 05/29/2023   AC (acromioclavicular) arthritis 02/27/2023   Increased prostate specific antigen (PSA) velocity 07/03/2022   Lipoma of right shoulder 05/21/2022   Low back pain 09/15/2021   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 07/22/2021   Aortic atherosclerosis (HCC) 04/18/2021   Melanoma in situ of right upper arm (HCC) 04/18/2021   H/O right nephrectomy 04/17/2020   Degenerative arthritis of left knee 02/25/2018   Chronic pain of left knee 02/08/2018   CKD (chronic kidney disease) stage 3, GFR 30-59 ml/min (HCC) 02/08/2018   Coronary atherosclerosis due to lipid rich plaque  05/26/2016   History of renal cell carcinoma 04/20/2016   B12 deficiency 04/08/2016   Encounter for preventive health examination 06/24/2014   Hypertension 06/23/2014   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 Reports an increase in R shoulder pain while wringing out of wash cloth and heard R shoulder pop.  Pt. States this happened 6 months ago.  Pt. C/o weakness in B shoulders and limited with Yoga positions.  Pt. Received injections in B shoulders with benefit.  Pt. States "I feel weak in both shoulders".    Hand dominance: Right  PERTINENT HISTORY: Pt. Known well to PT clinic.  See previous PT notes/ MD notes.   PAIN:  Are you having pain? No c/o shoulder pain at time of evaluation.   PRECAUTIONS: None  RED FLAGS: None   WEIGHT BEARING RESTRICTIONS: No  FALLS:  Has patient fallen in last 6 months? No  LIVING ENVIRONMENT: Lives with: lives with their spouse Lives in: House/apartment Has following equipment at home: None  OCCUPATION: Research scientist (medical)  PLOF: Independent  PATIENT GOALS:  Increase B shoulder strength/ get referral to personal trainer   NEXT MD VISIT: TBD  OBJECTIVE:  Note: Objective measures were completed at Evaluation unless otherwise noted.  DIAGNOSTIC FINDINGS:  FINDINGS: Mild glenohumeral degenerative change without loss of  joint space. No other bony or soft tissue abnormalities identified.   IMPRESSION: Mild glenohumeral degenerative change without loss of joint space.     Electronically Signed   By: Gerome [name] III M.D.   On: 12/28/2022 09:37  CERVICAL SPINE - 3 VIEW   COMPARISON:  None Available.   FINDINGS: No fracture, dislocation or subluxation. No spondylolisthesis. No osteolytic or osteoblastic changes. Prevertebral and cervical cranial soft tissues are unremarkable.   Degenerative disc disease noted with disc space narrowing and marginal osteophytes at C4-6.   IMPRESSION: Degenerative changes. No acute osseous abnormalities.   Electronically Signed   By: Layla Maw M.D.   On: 03/07/2023 13:52  PATIENT SURVEYS:  FOTO initial 68/ goal 74  COGNITION: Overall cognitive status: Within functional limits for tasks assessed     SENSATION: WFL  POSTURE: Slight rounded shoulder/ pec. tightness  UPPER EXTREMITY ROM:   Active ROM Right eval Left eval  Shoulder flexion 180 deg. 180 deg.  Shoulder extension    Shoulder abduction 170 deg. 170 deg.  Shoulder adduction    Shoulder internal rotation 75 deg. 75 deg.  Shoulder external rotation 80 deg. 80 deg.  Elbow flexion WNL WNL  Elbow extension WNL WNL  Wrist flexion    Wrist extension    Wrist ulnar deviation    Wrist radial deviation    Wrist pronation    Wrist supination    (Blank rows = not tested)  Cervical AROM: flexion 57 deg./ extension 66 deg./ L lat. Flex. 54 deg./ R lat. Flex. 44 deg. (R sided lipoma)/ L rotn. 70 deg./ R rotn. 67 deg.    UPPER EXTREMITY MMT:  MMT Right eval Left eval  Shoulder flexion 4 4  Shoulder extension    Shoulder abduction 4- 4-  Shoulder adduction    Shoulder internal rotation 4+ 4+  Shoulder external rotation 4 4  Middle trapezius 4 4  Lower trapezius 4 4  Elbow flexion 5 5  Elbow extension 5 5  Wrist flexion    Wrist extension    Wrist ulnar deviation    Wrist  radial deviation    Wrist pronation    Wrist supination    Grip strength (lbs) 85.9# 81.3#  (Blank rows = not tested)  SHOULDER SPECIAL TESTS: Impingement tests: Neer impingement test: negative and Hawkins/Kennedy impingement test: negative Rotator cuff assessment: Empty  can test: negative and Belly press test: negative  JOINT MOBILITY TESTING:  Good B shoulder capsular mobility (AP/PA/inf.).  No c/o pain.    PALPATION:  Negative   TODAY'S TREATMENT:                                                                                                                                         DATE: 06/27/2023   Subjective: Patient reports no recent flare-up of shoulder pain. Patient reports no notable symptoms at arrival. Patient reports tolerating yoga instruction/positions well. Pt reports difficulty with push-ups and some triceps weakness.    There.ex.:  B UBE 3 min. F/b.  Consistent cadence (no pain).    -subjective gathered for 3 minutes of time on UBE, 3 minutes unbilled   Modified plank, edge of table; alternating shoulder tap; 2x10  Prone Swimmer (overhead press with maintenance of scap retraction); on table with 3# DB in each hand; 2x8  Nautilus ex. (Use of bilateral single handles):  70# standing lat. Pull downs/  60# scap. Retraction/ 50# chest press/ 60# tricep extension 15x2 each     Bodyblade oscillations; 90 deg flexion and abduction; 2 x 30 sec each with either shoulder    PATIENT EDUCATION: Discussed with patient use of closed-chain exercise to improve scapulothoracic and shoulder complex stability. We reviewed use of single-handle cable exercises for carryover to commercial gym setting. We discussed goals of PT and transitioning to gym-based program as pt approaches end of POC.     *not today* Prone on blue ball: Y's, T's and I's.  10x2 (no wts./ marked fatigue noted).  Good technique.    Nautilus 50# standing tricep extension/40# sh. Adduction with handles Supine  5# serratus punches 10x.  Supine L/R shoulder rhythmic stabs 5# wt. 3x20 sec. Each. Standing white ball shoulder flexion to end-range shoulder flexion/ added bouncing with bilateral/ unilateral UE.  5x each for 20 sec.    PATIENT EDUCATION: Education details: Access Code: [email or web address] Person educated: Patient Education method: Explanation, Demonstration, and Handouts Education comprehension: verbalized understanding and returned demonstration  HOME EXERCISE PROGRAM: Access Code: [email or web address] URL: [email or web address] Date: 06/04/2023 Prepared by: Dorene Grebe  Exercises - Standing Shoulder Flexion with Resistance  - 1 x daily - 4 x weekly - 2 sets - 15 reps - Standing Single Arm Shoulder Abduction with Resistance  - 1 x daily - 4 x weekly - 2 sets - 15 reps - Standing Shoulder External Rotation with Resistance  - 1 x daily - 4 x weekly - 2 sets - 15 reps  Access Code: [email or web address] URL: [email or web address] Date: 06/20/2023 Prepared by: Dorene Grebe  Exercises - Standing Shoulder Flexion with Resistance  - 1 x daily - 4 x weekly - 2 sets - 15 reps - Standing Single Arm Shoulder Abduction with Resistance  - 1 x daily - 4 x weekly - 2 sets - 15 reps - Standing  Shoulder External Rotation with Resistance  - 1 x daily - 4 x weekly - 2 sets - 15 reps - Scapular Retraction with Resistance  - 1 x daily - 4 x weekly - 2 sets - 15 reps - Standing Tricep Extensions with Resistance  - 1 x daily - 4 x weekly - 2 sets - 15 reps - Prone Scapular Retraction Arms at Side  - 1 x daily - 4 x weekly - 2 sets - 10 reps - Prone Scapular Retraction Y  - 1 x daily - 4 x weekly - 2 sets - 10 reps  ASSESSMENT:  CLINICAL IMPRESSION:    Patient arrives to treatment session motivated to participate with no complaints of pain. Session focused on B UE strengthening and stabilization. Increased volume of BodyBlade with good tolerance. No increase in symptoms during session. Pt. Will benefit  from skilled PT services to develop HEP to increase B shoulder strength to improve pain-free mobility with household tasks/ Yoga ex.  OBJECTIVE IMPAIRMENTS: decreased activity tolerance, decreased mobility, decreased strength, impaired UE functional use, improper body mechanics, postural dysfunction, and pain.   ACTIVITY LIMITATIONS: carrying, lifting, and locomotion level  PARTICIPATION LIMITATIONS: cleaning, interpersonal relationship, community activity, and occupation  PERSONAL FACTORS: Fitness and Past/current experiences are also affecting patient's functional outcome.   REHAB POTENTIAL: Good  CLINICAL DECISION MAKING: Stable/uncomplicated  EVALUATION COMPLEXITY: Low   GOALS: Goals reviewed with patient? Yes  SHORT TERM GOALS: Target date: 06/25/23  Pt. Will be independent with HEP to increase B UE muscle strength 1/2 muscle grade to improve pain-free mobility.   Baseline:  see above Goal status: INITIAL   LONG TERM GOALS: Target date: 07/16/23  Pt. Will increase FOTO to 74 to improve pain-free mobility.   Baseline: initial 68 Goal status: INITIAL  2.  Pt. Able to complete plank/ upward dog yoga poses with no shoulder pain/ limitations.  Baseline: pain limited/ weakness noted Goal status: INITIAL  3.  Pt. Will progress with B UE ex. Program with assist from personal trainer to maintain functional gains from PT ex. Program.   Baseline: pt. Hoping to work with Systems analyst.  Goal status: INITIAL  PLAN:  PT FREQUENCY: 1-2x/week  PT DURATION: 6 weeks  PLANNED INTERVENTIONS: 97110-Therapeutic exercises, 97530- Therapeutic activity, [id number]- Neuromuscular re-education, 97535- Self Care, 16109- Manual therapy, 97014- Electrical stimulation (unattended), Taping, Dry Needling, Joint mobilization, Cryotherapy, and Moist heat  PLAN FOR NEXT SESSION: Progress with BodyBlade drills, shoulder stabilization. Pt education on carryover to commercial gym setting.    Maylon Peppers, PT, DPT Physical Therapist - [facility]  06/27/2023, 7:33 AM

## 2023-06-27 ENCOUNTER — Ambulatory Visit: Payer: BC Managed Care – PPO

## 2023-06-27 ENCOUNTER — Encounter: Payer: Self-pay | Admitting: Physical Therapy

## 2023-06-27 DIAGNOSIS — M6281 Muscle weakness (generalized): Secondary | ICD-10-CM

## 2023-06-27 DIAGNOSIS — M5441 Lumbago with sciatica, right side: Secondary | ICD-10-CM | POA: Diagnosis not present

## 2023-06-27 DIAGNOSIS — G8929 Other chronic pain: Secondary | ICD-10-CM | POA: Diagnosis not present

## 2023-06-27 DIAGNOSIS — M25512 Pain in left shoulder: Secondary | ICD-10-CM | POA: Diagnosis not present

## 2023-06-27 DIAGNOSIS — M25511 Pain in right shoulder: Secondary | ICD-10-CM | POA: Diagnosis not present

## 2023-06-27 DIAGNOSIS — M25562 Pain in left knee: Secondary | ICD-10-CM | POA: Diagnosis not present

## 2023-07-02 ENCOUNTER — Ambulatory Visit: Payer: BC Managed Care – PPO | Admitting: Physical Therapy

## 2023-07-02 DIAGNOSIS — M6281 Muscle weakness (generalized): Secondary | ICD-10-CM | POA: Diagnosis not present

## 2023-07-02 DIAGNOSIS — G8929 Other chronic pain: Secondary | ICD-10-CM

## 2023-07-02 DIAGNOSIS — M5441 Lumbago with sciatica, right side: Secondary | ICD-10-CM | POA: Diagnosis not present

## 2023-07-02 DIAGNOSIS — M25512 Pain in left shoulder: Secondary | ICD-10-CM | POA: Diagnosis not present

## 2023-07-02 DIAGNOSIS — M25511 Pain in right shoulder: Secondary | ICD-10-CM | POA: Diagnosis not present

## 2023-07-02 DIAGNOSIS — M25562 Pain in left knee: Secondary | ICD-10-CM | POA: Diagnosis not present

## 2023-07-02 NOTE — Progress Notes (Unsigned)
[facility] [address] [place] [address] Phone: [phone] Subjective:   Daniel Lawrence, am serving as a scribe for Dr. Antoine Primas.  I'm seeing this patient by the request  of:  Sherlene Shams, MD  CC: Bilateral shoulder pain  WUX:[id number]  Daniel Lawrence is a 62 y.o. male coming in with complaint of back and neck pain. OMT 04/17/2023. Also f/u for B shoulder pain. Patient has been doing PT. Patient states Pt has been going well. Same per usual. Take a look at L knee. Has a trip coming up.  Medications patient has been prescribed: Gabapentin  Taking: Yes  Reviewing patient's chart has been going to physical therapy regularly since last visit.     Previous x-rays of the neck were independently visualized by me showing degenerative disc disease at multiple levels but seems to be worse at C4-C5.  Reviewed prior external information including notes and imaging from previsou exam, outside providers and external EMR if available.   As well as notes that were available from care everywhere and other healthcare systems.  Past medical history, social, surgical and family history all reviewed in electronic medical record.  No pertanent information unless stated regarding to the chief complaint.   Past Medical History:  Diagnosis Date   Allergy    seasonal   Arthritis    left knee   Cancer (HCC)    skin cancer   Cataract    Hyperlipidemia    Hypertension     Allergies  Allergen Reactions   Sulfa Antibiotics Rash     Review of Systems:  No headache, visual changes, nausea, vomiting, diarrhea, constipation, dizziness, abdominal pain, skin rash, fevers, chills, night sweats, weight loss, swollen lymph nodes, body aches, joint swelling, chest pain, shortness of breath, mood changes. POSITIVE muscle aches  Objective  Blood pressure 116/72, pulse 74, height 5\' 10"  (1.778 m), weight 195 lb (88.5 kg), SpO2 97%.   General: No  apparent distress alert and oriented x3 mood and affect normal, dressed appropriately.  HEENT: Pupils equal, extraocular movements intact  Respiratory: Patient's speak in full sentences and does not appear short of breath  MSK:  Back loss lordosis noted.  Some tenderness to palpation in the paraspinal musculature otherwise.  Negative straight leg test noted. Left knee does have a varus deformity noted.  Crepitus noted.  Lateral tracking of the patella noted. Osteopathic findings  C3 flexed rotated and side bent right C6 flexed rotated and side bent left T3 extended rotated and side bent right inhaled rib T9 extended rotated and side bent left L2 flexed rotated and side bent right L4 flexed rotated and side bent Sacrum right on right   After informed written and verbal consent, patient was seated on exam table. Left knee was prepped with alcohol swab and utilizing anterolateral approach, patient's left knee space was injected with 4:1  marcaine 0.5%: Kenalog 40mg /dL. Patient tolerated the procedure well without immediate complications.   Assessment and Plan:  Degenerative arthritis of left knee Repeat injection given today and tolerated the procedure well, discussed icing regimen and home exercises, discussed avoiding certain activities.  Follow-up again with me in 6 to 8 weeks.  Could be a candidate for potential viscosupplementation but do believe patient will do relatively well with the current therapy.  Low back pain Significant low back pain.  Will continue to monitor.  Patient has been followed by multiple other providers for his other comorbidities including further  evaluation for the potential melanoma as well as the history of a renal cell carcinoma.  Discussed with patient about icing regimen and home exercises.  Continue to work on core strengthening.  Patient has done quite remarkable for many years.    Nonallopathic problems  Decision today to treat with OMT was based on  Physical Exam  After verbal consent patient was treated with HVLA, ME, FPR techniques in cervical, rib, thoracic, lumbar, and sacral  areas  Patient tolerated the procedure well with improvement in symptoms  Patient given exercises, stretches and lifestyle modifications  See medications in patient instructions if given  Patient will follow up in 4-8 weeks     The above documentation has been reviewed and is accurate and complete Judi Saa, DO         Note: This dictation was prepared with Dragon dictation along with smaller phrase technology. Any transcriptional errors that result from this process are unintentional.

## 2023-07-02 NOTE — Therapy (Signed)
OUTPATIENT PHYSICAL THERAPY SHOULDER TREATMENT  Patient Name: Daniel Lawrence MRN: [id number] DOB:02-12-1961, 62 y.o., male Today's Date: 07/02/2023  END OF SESSION:  PT End of Session - 07/02/23 0716     Visit Number 7    Number of Visits 12    Date for PT Re-Evaluation 07/16/23    PT Start Time 0730    PT Stop Time 0817    PT Time Calculation (min) 47 min    Activity Tolerance Patient tolerated treatment well    Behavior During Therapy [facility] for tasks assessed/performed              Past Medical History:  Diagnosis Date   Allergy    seasonal   Arthritis    left knee   Cancer (HCC)    skin cancer   Cataract    Hyperlipidemia    Hypertension    Past Surgical History:  Procedure Laterality Date   BRAIN SURGERY  1971   COLONOSCOPY  05/03/2021   ELEVATION OF DEPRESSED SKULL FRACTURE  07/16/1969   traumatic blow to head by golf club   , [facility])   MOHS SURGERY  09/14/2011   left temple, squamous   ROBOT ASSISTED LAPAROSCOPIC NEPHRECTOMY Right 04/20/2016   Procedure: XI ROBOTIC ASSISTED LAPAROSCOPIC RADICAL NEPHRECTOMY;  Surgeon: Sebastian Ache, MD;  Location: [facility];  Service: Urology;  Laterality: Right;   Patient Active Problem List   Diagnosis Date Noted   Tubular adenoma of colon 05/29/2023   Hearing loss due to cerumen impaction, right 05/29/2023   AC (acromioclavicular) arthritis 02/27/2023   Increased prostate specific antigen (PSA) velocity 07/03/2022   Lipoma of right shoulder 05/21/2022   Low back pain 09/15/2021   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 07/22/2021   Aortic atherosclerosis (HCC) 04/18/2021   Melanoma in situ of right upper arm (HCC) 04/18/2021   H/O right nephrectomy 04/17/2020   Degenerative arthritis of left knee 02/25/2018   Chronic pain of left knee 02/08/2018   CKD (chronic kidney disease) stage 3, GFR 30-59 ml/min (HCC) 02/08/2018   Coronary atherosclerosis due to lipid rich plaque  05/26/2016   History of renal cell carcinoma 04/20/2016   B12 deficiency 04/08/2016   Encounter for preventive health examination 06/24/2014   Hypertension 06/23/2014   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 Reports an increase in R shoulder pain while wringing out of wash cloth and heard R shoulder pop.  Pt. States this happened 6 months ago.  Pt. C/o weakness in B shoulders and limited with Yoga positions.  Pt. Received injections in B shoulders with benefit.  Pt. States "I feel weak in both shoulders".    Hand dominance: Right  PERTINENT HISTORY: Pt. Known well to PT clinic.  See previous PT notes/ MD notes.   PAIN:  Are you having pain? No c/o shoulder pain at time of evaluation.   PRECAUTIONS: None  RED FLAGS: None   WEIGHT BEARING RESTRICTIONS: No  FALLS:  Has patient fallen in last 6 months? No  LIVING ENVIRONMENT: Lives with: lives with their spouse Lives in: House/apartment Has following equipment at home: None  OCCUPATION: Research scientist (medical)  PLOF: Independent  PATIENT GOALS:  Increase B shoulder strength/ get referral to personal trainer   NEXT MD VISIT: TBD  OBJECTIVE:  Note: Objective measures were completed at Evaluation unless otherwise noted.  DIAGNOSTIC FINDINGS:  FINDINGS: Mild glenohumeral degenerative change without loss of  joint space. No other bony or soft tissue abnormalities identified.   IMPRESSION: Mild glenohumeral degenerative change without loss of joint space.     Electronically Signed   By: Gerome [name] III M.D.   On: 12/28/2022 09:37  CERVICAL SPINE - 3 VIEW   COMPARISON:  None Available.   FINDINGS: No fracture, dislocation or subluxation. No spondylolisthesis. No osteolytic or osteoblastic changes. Prevertebral and cervical cranial soft tissues are unremarkable.   Degenerative disc disease noted with disc space narrowing and marginal osteophytes at C4-6.   IMPRESSION: Degenerative changes. No acute osseous abnormalities.   Electronically Signed   By: Layla Maw M.D.   On: 03/07/2023 13:52  PATIENT SURVEYS:  FOTO initial 68/ goal 74  COGNITION: Overall cognitive status: Within functional limits for tasks assessed     SENSATION: WFL  POSTURE: Slight rounded shoulder/ pec. tightness  UPPER EXTREMITY ROM:   Active ROM Right eval Left eval  Shoulder flexion 180 deg. 180 deg.  Shoulder extension    Shoulder abduction 170 deg. 170 deg.  Shoulder adduction    Shoulder internal rotation 75 deg. 75 deg.  Shoulder external rotation 80 deg. 80 deg.  Elbow flexion WNL WNL  Elbow extension WNL WNL  Wrist flexion    Wrist extension    Wrist ulnar deviation    Wrist radial deviation    Wrist pronation    Wrist supination    (Blank rows = not tested)  Cervical AROM: flexion 57 deg./ extension 66 deg./ L lat. Flex. 54 deg./ R lat. Flex. 44 deg. (R sided lipoma)/ L rotn. 70 deg./ R rotn. 67 deg.    UPPER EXTREMITY MMT:  MMT Right eval Left eval  Shoulder flexion 4 4  Shoulder extension    Shoulder abduction 4- 4-  Shoulder adduction    Shoulder internal rotation 4+ 4+  Shoulder external rotation 4 4  Middle trapezius 4 4  Lower trapezius 4 4  Elbow flexion 5 5  Elbow extension 5 5  Wrist flexion    Wrist extension    Wrist ulnar deviation    Wrist  radial deviation    Wrist pronation    Wrist supination    Grip strength (lbs) 85.9# 81.3#  (Blank rows = not tested)  SHOULDER SPECIAL TESTS: Impingement tests: Neer impingement test: negative and Hawkins/Kennedy impingement test: negative Rotator cuff assessment: Empty  can test: negative and Belly press test: negative  JOINT MOBILITY TESTING:  Good B shoulder capsular mobility (AP/PA/inf.).  No c/o pain.    PALPATION:  Negative   TODAY'S TREATMENT:                                                                                                                                         DATE: 07/02/2023   Subjective: Pt. Leaving for trip to [place] on 12/29.  Pt. Reports no new issues.  Pt. States his upper back muscle are "looser" and doing well.  Pt. Described difficulty picking up a 5 gallon bottle of water and moving it from car.  No pain.     There.ex.:  B UBE 3 min. F/b.  Consistent cadence (no pain)- warm-up.  Bodyblade oscillations: B shoulder flexion with movement 5x (mirror feedback); R/L tricep/bicep 2 x 30 sec.; R/L IR and ER 2 x 30 sec; behind back with bilateral UE assist 2 x 30 sec each with either shoulder.  Floor to waist lifting 30# with instruction on proper mechanics.  B carrying 30# box in clinic (3 laps).    Nautilus ex. (Use of bilateral single handles): 40# B adduction/ 70# standing lat. Pull downs/ 60# tricep extension/ 60# scap. Retraction 15x2 each     Standing BTB shoulder ER 15x2.  Challenged/ fatigued.    Discussed HEP  PATIENT EDUCATION: Discussed goals of PT and transitioning to gym-based program as pt approaches end of POC.     PATIENT EDUCATION: Education details: Access Code: [email or web address] Person educated: Patient Education method: Explanation, Demonstration, and Handouts Education comprehension: verbalized understanding and returned demonstration  HOME EXERCISE PROGRAM: Access Code: [email or web address] URL: [email or web address] Date:  06/04/2023 Prepared by: Dorene Grebe  Exercises - Standing Shoulder Flexion with Resistance  - 1 x daily - 4 x weekly - 2 sets - 15 reps - Standing Single Arm Shoulder Abduction with Resistance  - 1 x daily - 4 x weekly - 2 sets - 15 reps - Standing Shoulder External Rotation with Resistance  - 1 x daily - 4 x weekly - 2 sets - 15 reps  Access Code: [email or web address] URL: [email or web address] Date: 06/20/2023 Prepared by: Dorene Grebe  Exercises - Standing Shoulder Flexion with Resistance  - 1 x daily - 4 x weekly - 2 sets - 15 reps - Standing Single Arm Shoulder Abduction with Resistance  - 1 x daily - 4 x weekly - 2 sets - 15 reps - Standing Shoulder External Rotation with Resistance  - 1 x daily - 4 x weekly - 2 sets - 15 reps - Scapular Retraction with Resistance  - 1 x daily - 4 x weekly - 2 sets - 15 reps - Standing Tricep Extensions with Resistance  - 1 x daily - 4 x weekly - 2 sets - 15 reps - Prone Scapular Retraction Arms at Side  - 1 x  daily - 4 x weekly - 2 sets - 10 reps - Prone Scapular Retraction Y  - 1 x daily - 4 x weekly - 2 sets - 10 reps  ASSESSMENT:  CLINICAL IMPRESSION:    Patient arrives to treatment session motivated to participate with no complaints of pain. Session focused on B UE strengthening and stabilization. Increased volume of BodyBlade with good tolerance. No increase in symptoms during session. Pt. Will benefit from skilled PT services to develop HEP to increase B shoulder strength to improve pain-free mobility with household tasks/ Yoga ex.  OBJECTIVE IMPAIRMENTS: decreased activity tolerance, decreased mobility, decreased strength, impaired UE functional use, improper body mechanics, postural dysfunction, and pain.   ACTIVITY LIMITATIONS: carrying, lifting, and locomotion level  PARTICIPATION LIMITATIONS: cleaning, interpersonal relationship, community activity, and occupation  PERSONAL FACTORS: Fitness and Past/current experiences are also  affecting patient's functional outcome.   REHAB POTENTIAL: Good  CLINICAL DECISION MAKING: Stable/uncomplicated  EVALUATION COMPLEXITY: Low   GOALS: Goals reviewed with patient? Yes  SHORT TERM GOALS: Target date: 06/25/23  Pt. Will be independent with HEP to increase B UE muscle strength 1/2 muscle grade to improve pain-free mobility.   Baseline:  see above Goal status: Goal met   LONG TERM GOALS: Target date: 07/16/23  Pt. Will increase FOTO to 74 to improve pain-free mobility.   Baseline: initial 68 Goal status: INITIAL  2.  Pt. Able to complete plank/ upward dog yoga poses with no shoulder pain/ limitations.  Baseline: pain limited/ weakness noted Goal status: INITIAL  3.  Pt. Will progress with B UE ex. Program with assist from personal trainer to maintain functional gains from PT ex. Program.   Baseline: pt. Hoping to work with Systems analyst.  Goal status: INITIAL  PLAN:  PT FREQUENCY: 1-2x/week  PT DURATION: 6 weeks  PLANNED INTERVENTIONS: 97110-Therapeutic exercises, 97530- Therapeutic activity, [id number]- Neuromuscular re-education, 97535- Self Care, 16109- Manual therapy, 97014- Electrical stimulation (unattended), Taping, Dry Needling, Joint mobilization, Cryotherapy, and Moist heat  PLAN FOR NEXT SESSION: Pt education on carryover to commercial gym setting.  CHECK FOTO   Cammie Mcgee, PT, DPT # [id number] Physical Therapist - [facility]  07/02/2023, 6:02 PM

## 2023-07-03 ENCOUNTER — Encounter: Payer: Self-pay | Admitting: Family Medicine

## 2023-07-03 ENCOUNTER — Ambulatory Visit: Payer: BC Managed Care – PPO | Admitting: Family Medicine

## 2023-07-03 VITALS — BP 116/72 | HR 74 | Ht 70.0 in | Wt 195.0 lb

## 2023-07-03 DIAGNOSIS — M9901 Segmental and somatic dysfunction of cervical region: Secondary | ICD-10-CM | POA: Diagnosis not present

## 2023-07-03 DIAGNOSIS — M9902 Segmental and somatic dysfunction of thoracic region: Secondary | ICD-10-CM | POA: Diagnosis not present

## 2023-07-03 DIAGNOSIS — M9904 Segmental and somatic dysfunction of sacral region: Secondary | ICD-10-CM | POA: Diagnosis not present

## 2023-07-03 DIAGNOSIS — M25562 Pain in left knee: Secondary | ICD-10-CM

## 2023-07-03 DIAGNOSIS — G8929 Other chronic pain: Secondary | ICD-10-CM | POA: Diagnosis not present

## 2023-07-03 DIAGNOSIS — M5441 Lumbago with sciatica, right side: Secondary | ICD-10-CM | POA: Diagnosis not present

## 2023-07-03 DIAGNOSIS — M9908 Segmental and somatic dysfunction of rib cage: Secondary | ICD-10-CM

## 2023-07-03 DIAGNOSIS — M1712 Unilateral primary osteoarthritis, left knee: Secondary | ICD-10-CM | POA: Diagnosis not present

## 2023-07-03 DIAGNOSIS — M9903 Segmental and somatic dysfunction of lumbar region: Secondary | ICD-10-CM

## 2023-07-03 DIAGNOSIS — H6121 Impacted cerumen, right ear: Secondary | ICD-10-CM

## 2023-07-03 MED ORDER — AMOXICILLIN-POT CLAVULANATE 875-125 MG PO TABS: 1.0000 | ORAL_TABLET | Freq: Two times a day (BID) | ORAL | 1 refills | Status: DC

## 2023-07-03 MED ORDER — PREDNISONE 20 MG PO TABS: 20.0000 mg | ORAL_TABLET | Freq: Every day | ORAL | 0 refills | Status: DC

## 2023-07-03 NOTE — Assessment & Plan Note (Signed)
Questionable sinusitis.  Given Augmentin and prednisone so patient will have him for traveling in case he has some more difficulty.

## 2023-07-03 NOTE — Assessment & Plan Note (Signed)
Significant low back pain.  Will continue to monitor.  Patient has been followed by multiple other providers for his other comorbidities including further evaluation for the potential melanoma as well as the history of a renal cell carcinoma.  Discussed with patient about icing regimen and home exercises.  Continue to work on core strengthening.  Patient has done quite remarkable for many years.

## 2023-07-03 NOTE — Patient Instructions (Addendum)
Injection in knee today Good to see you! Happy Holidays See you again in 8-10 weeks Can increase tart cherry to 3600mg 

## 2023-07-03 NOTE — Assessment & Plan Note (Signed)
Repeat injection given today and tolerated the procedure well, discussed icing regimen and home exercises, discussed avoiding certain activities.  Follow-up again with me in 6 to 8 weeks.  Could be a candidate for potential viscosupplementation but do believe patient will do relatively well with the current therapy.

## 2023-07-04 ENCOUNTER — Encounter: Payer: Self-pay | Admitting: Physical Therapy

## 2023-07-04 ENCOUNTER — Ambulatory Visit: Payer: BC Managed Care – PPO | Admitting: Physical Therapy

## 2023-07-04 DIAGNOSIS — M25511 Pain in right shoulder: Secondary | ICD-10-CM | POA: Diagnosis not present

## 2023-07-04 DIAGNOSIS — G8929 Other chronic pain: Secondary | ICD-10-CM | POA: Diagnosis not present

## 2023-07-04 DIAGNOSIS — M6281 Muscle weakness (generalized): Secondary | ICD-10-CM | POA: Diagnosis not present

## 2023-07-04 DIAGNOSIS — M25562 Pain in left knee: Secondary | ICD-10-CM | POA: Diagnosis not present

## 2023-07-04 DIAGNOSIS — M25512 Pain in left shoulder: Secondary | ICD-10-CM | POA: Diagnosis not present

## 2023-07-04 DIAGNOSIS — M5441 Lumbago with sciatica, right side: Secondary | ICD-10-CM | POA: Diagnosis not present

## 2023-07-04 NOTE — Therapy (Signed)
OUTPATIENT PHYSICAL THERAPY SHOULDER TREATMENT  Patient Name: Daniel Lawrence MRN: [id number] DOB:Jan 24, 1961, 62 y.o., male Today's Date: 07/04/2023  END OF SESSION:  PT End of Session - 07/04/23 0721     Visit Number 8    Number of Visits 12    Date for PT Re-Evaluation 07/16/23    PT Start Time 0726    PT Stop Time 0815    PT Time Calculation (min) 49 min    Activity Tolerance Patient tolerated treatment well    Behavior During Therapy [facility] for tasks assessed/performed              Past Medical History:  Diagnosis Date   Allergy    seasonal   Arthritis    left knee   Cancer (HCC)    skin cancer   Cataract    Hyperlipidemia    Hypertension    Past Surgical History:  Procedure Laterality Date   BRAIN SURGERY  1971   COLONOSCOPY  05/03/2021   ELEVATION OF DEPRESSED SKULL FRACTURE  07/16/1969   traumatic blow to head by golf club   , [facility])   MOHS SURGERY  09/14/2011   left temple, squamous   ROBOT ASSISTED LAPAROSCOPIC NEPHRECTOMY Right 04/20/2016   Procedure: XI ROBOTIC ASSISTED LAPAROSCOPIC RADICAL NEPHRECTOMY;  Surgeon: Sebastian Ache, MD;  Location: [facility];  Service: Urology;  Laterality: Right;   Patient Active Problem List   Diagnosis Date Noted   Tubular adenoma of colon 05/29/2023   Hearing loss due to cerumen impaction, right 05/29/2023   AC (acromioclavicular) arthritis 02/27/2023   Increased prostate specific antigen (PSA) velocity 07/03/2022   Lipoma of right shoulder 05/21/2022   Low back pain 09/15/2021   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 07/22/2021   Aortic atherosclerosis (HCC) 04/18/2021   Melanoma in situ of right upper arm (HCC) 04/18/2021   H/O right nephrectomy 04/17/2020   Degenerative arthritis of left knee 02/25/2018   Chronic pain of left knee 02/08/2018   CKD (chronic kidney disease) stage 3, GFR 30-59 ml/min (HCC) 02/08/2018   Coronary atherosclerosis due to lipid rich plaque  05/26/2016   History of renal cell carcinoma 04/20/2016   B12 deficiency 04/08/2016   Encounter for preventive health examination 06/24/2014   Hypertension 06/23/2014   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 Reports an increase in R shoulder pain while wringing out of wash cloth and heard R shoulder pop.  Pt. States this happened 6 months ago.  Pt. C/o weakness in B shoulders and limited with Yoga positions.  Pt. Received injections in B shoulders with benefit.  Pt. States "I feel weak in both shoulders".    Hand dominance: Right  PERTINENT HISTORY: Pt. Known well to PT clinic.  See previous PT notes/ MD notes.   PAIN:  Are you having pain? No c/o shoulder pain at time of evaluation.   PRECAUTIONS: None  RED FLAGS: None   WEIGHT BEARING RESTRICTIONS: No  FALLS:  Has patient fallen in last 6 months? No  LIVING ENVIRONMENT: Lives with: lives with their spouse Lives in: House/apartment Has following equipment at home: None  OCCUPATION: Research scientist (medical)  PLOF: Independent  PATIENT GOALS:  Increase B shoulder strength/ get referral to personal trainer   NEXT MD VISIT: TBD  OBJECTIVE:  Note: Objective measures were completed at Evaluation unless otherwise  noted.  DIAGNOSTIC FINDINGS:  FINDINGS: Mild glenohumeral degenerative change without loss of joint space. No other bony or soft tissue abnormalities identified.   IMPRESSION: Mild glenohumeral degenerative change without loss of joint space.     Electronically Signed   By: Gerome [name] III M.D.   On: 12/28/2022 09:37  CERVICAL SPINE - 3 VIEW   COMPARISON:  None Available.   FINDINGS: No fracture, dislocation or subluxation. No spondylolisthesis. No osteolytic or osteoblastic changes. Prevertebral and cervical cranial soft tissues are unremarkable.   Degenerative disc disease noted with disc space narrowing and marginal osteophytes at C4-6.   IMPRESSION: Degenerative changes. No acute osseous abnormalities.   Electronically Signed   By: Layla Maw M.D.   On: 03/07/2023 13:52  PATIENT SURVEYS:  FOTO initial 68/ goal 74  COGNITION: Overall cognitive status: Within functional limits for tasks assessed     SENSATION: WFL  POSTURE: Slight rounded shoulder/ pec. tightness  UPPER EXTREMITY ROM:   Active ROM Right eval Left eval  Shoulder flexion 180 deg. 180 deg.  Shoulder extension    Shoulder abduction 170 deg. 170 deg.  Shoulder adduction    Shoulder internal rotation 75 deg. 75 deg.  Shoulder external rotation 80 deg. 80 deg.  Elbow flexion WNL WNL  Elbow extension WNL WNL  Wrist flexion    Wrist extension    Wrist ulnar deviation    Wrist radial deviation    Wrist pronation    Wrist supination    (Blank rows = not tested)  Cervical AROM: flexion 57 deg./ extension 66 deg./ L lat. Flex. 54 deg./ R lat. Flex. 44 deg. (R sided lipoma)/ L rotn. 70 deg./ R rotn. 67 deg.    UPPER EXTREMITY MMT:  MMT Right eval Left eval  Shoulder flexion 4 4  Shoulder extension    Shoulder abduction 4- 4-  Shoulder adduction    Shoulder internal rotation 4+ 4+  Shoulder external rotation 4 4  Middle trapezius 4 4  Lower trapezius 4 4  Elbow flexion 5  5  Elbow extension 5 5  Wrist flexion    Wrist extension    Wrist ulnar deviation    Wrist radial deviation    Wrist pronation    Wrist supination    Grip strength (lbs) 85.9# 81.3#  (Blank rows = not tested)  SHOULDER SPECIAL TESTS: Impingement tests: Neer impingement test: negative and Hawkins/Kennedy impingement test: negative Rotator cuff assessment: Empty  can test: negative and Belly press test: negative  JOINT MOBILITY TESTING:  Good B shoulder capsular mobility (AP/PA/inf.).  No c/o pain.    PALPATION:  Negative   TODAY'S TREATMENT:                                                                                                                                         DATE: 07/04/2023   Subjective:  Pt. Reports no new issues.  Pt. Leaving for trip to [place] on 12/29.    There.ex.:  B UBE 3 min. F/b.  Consistent cadence (no pain)- warm-up.  Standing BTB shoulder ER/ horizontal abduction 15x2.  Mirror feedback for posture/ technique.   Bodyblade oscillations: B shoulder flexion with movement 5x (mirror feedback); R/L IR and ER 2 x 30 sec; behind back with bilateral UE assist 2 x 30 sec each with either shoulder.  Nautilus ex. (Use of bilateral single handles): 40# B adduction/ 80# standing lat. Pull downs/ 60# tricep extension/ 60# scap. Retraction 15x2 each     Standing shoulder flexion with ball bouncing in hallway.    Discussed HEP  PATIENT EDUCATION: Discussed goals of PT and transitioning to gym-based program as pt approaches end of POC.     PATIENT EDUCATION: Education details: Access Code: [email or web address] Person educated: Patient Education method: Explanation, Demonstration, and Handouts Education comprehension: verbalized understanding and returned demonstration  HOME EXERCISE PROGRAM: Access Code: [email or web address] URL: [email or web address] Date: 06/04/2023 Prepared by: Dorene Grebe  Exercises - Standing Shoulder Flexion with Resistance  - 1 x daily  - 4 x weekly - 2 sets - 15 reps - Standing Single Arm Shoulder Abduction with Resistance  - 1 x daily - 4 x weekly - 2 sets - 15 reps - Standing Shoulder External Rotation with Resistance  - 1 x daily - 4 x weekly - 2 sets - 15 reps  Access Code: [email or web address] URL: [email or web address] Date: 06/20/2023 Prepared by: Dorene Grebe  Exercises - Standing Shoulder Flexion with Resistance  - 1 x daily - 4 x weekly - 2 sets - 15 reps - Standing Single Arm Shoulder Abduction with Resistance  - 1 x daily - 4 x weekly - 2 sets - 15 reps - Standing Shoulder External Rotation with Resistance  - 1 x daily - 4 x weekly - 2 sets - 15 reps - Scapular Retraction with Resistance  - 1 x daily - 4 x weekly - 2 sets - 15 reps - Standing Tricep Extensions with Resistance  - 1 x daily - 4 x weekly - 2 sets - 15 reps - Prone Scapular Retraction Arms at Side  - 1 x daily - 4 x weekly - 2 sets - 10 reps - Prone Scapular Retraction Y  - 1 x daily - 4 x weekly - 2 sets - 10 reps  ASSESSMENT:  CLINICAL IMPRESSION:    Patient arrives to treatment session motivated  to participate with no complaints of pain. Session focused on B UE strengthening and stabilization. Increased volume of BodyBlade with good tolerance. No increase in symptoms during session. Pt. Will benefit from skilled PT services to develop HEP to increase B shoulder strength to improve pain-free mobility with household tasks/ Yoga ex.  OBJECTIVE IMPAIRMENTS: decreased activity tolerance, decreased mobility, decreased strength, impaired UE functional use, improper body mechanics, postural dysfunction, and pain.   ACTIVITY LIMITATIONS: carrying, lifting, and locomotion level  PARTICIPATION LIMITATIONS: cleaning, interpersonal relationship, community activity, and occupation  PERSONAL FACTORS: Fitness and Past/current experiences are also affecting patient's functional outcome.   REHAB POTENTIAL: Good  CLINICAL DECISION MAKING:  Stable/uncomplicated  EVALUATION COMPLEXITY: Low   GOALS: Goals reviewed with patient? Yes  SHORT TERM GOALS: Target date: 06/25/23  Pt. Will be independent with HEP to increase B UE muscle strength 1/2 muscle grade to improve pain-free mobility.   Baseline:  see above Goal status: Goal met   LONG TERM GOALS: Target date: 07/16/23  Pt. Will increase FOTO to 74 to improve pain-free mobility.   Baseline: initial 68.  12/19: 74 Goal status: Goal met  2.  Pt. Able to complete plank/ upward dog yoga poses with no shoulder pain/ limitations.  Baseline: pain limited/ weakness noted Goal status: INITIAL  3.  Pt. Will progress with B UE ex. Program with assist from personal trainer to maintain functional gains from PT ex. Program.   Baseline: pt. Hoping to work with Systems analyst.  Goal status: INITIAL  PLAN:  PT FREQUENCY: 1-2x/week  PT DURATION: 6 weeks  PLANNED INTERVENTIONS: 97110-Therapeutic exercises, 97530- Therapeutic activity, [id number]- Neuromuscular re-education, 97535- Self Care, 16109- Manual therapy, 97014- Electrical stimulation (unattended), Taping, Dry Needling, Joint mobilization, Cryotherapy, and Moist heat  PLAN FOR NEXT SESSION: Pt education on carryover to commercial gym setting.     Cammie Mcgee, PT, DPT # [phone] Physical Therapist - [facility]  07/04/2023, 8:16 AM

## 2023-07-10 IMAGING — DX DG KNEE STANDING AP BILAT
1 series · 1 of 1 positions shown · non-contrast
Comparison: None.

CLINICAL DATA: Bilateral knee pain for 5-6 years.  No known injury.

EXAM:
BILATERAL KNEES STANDING - 1 VIEW

[knee ap]
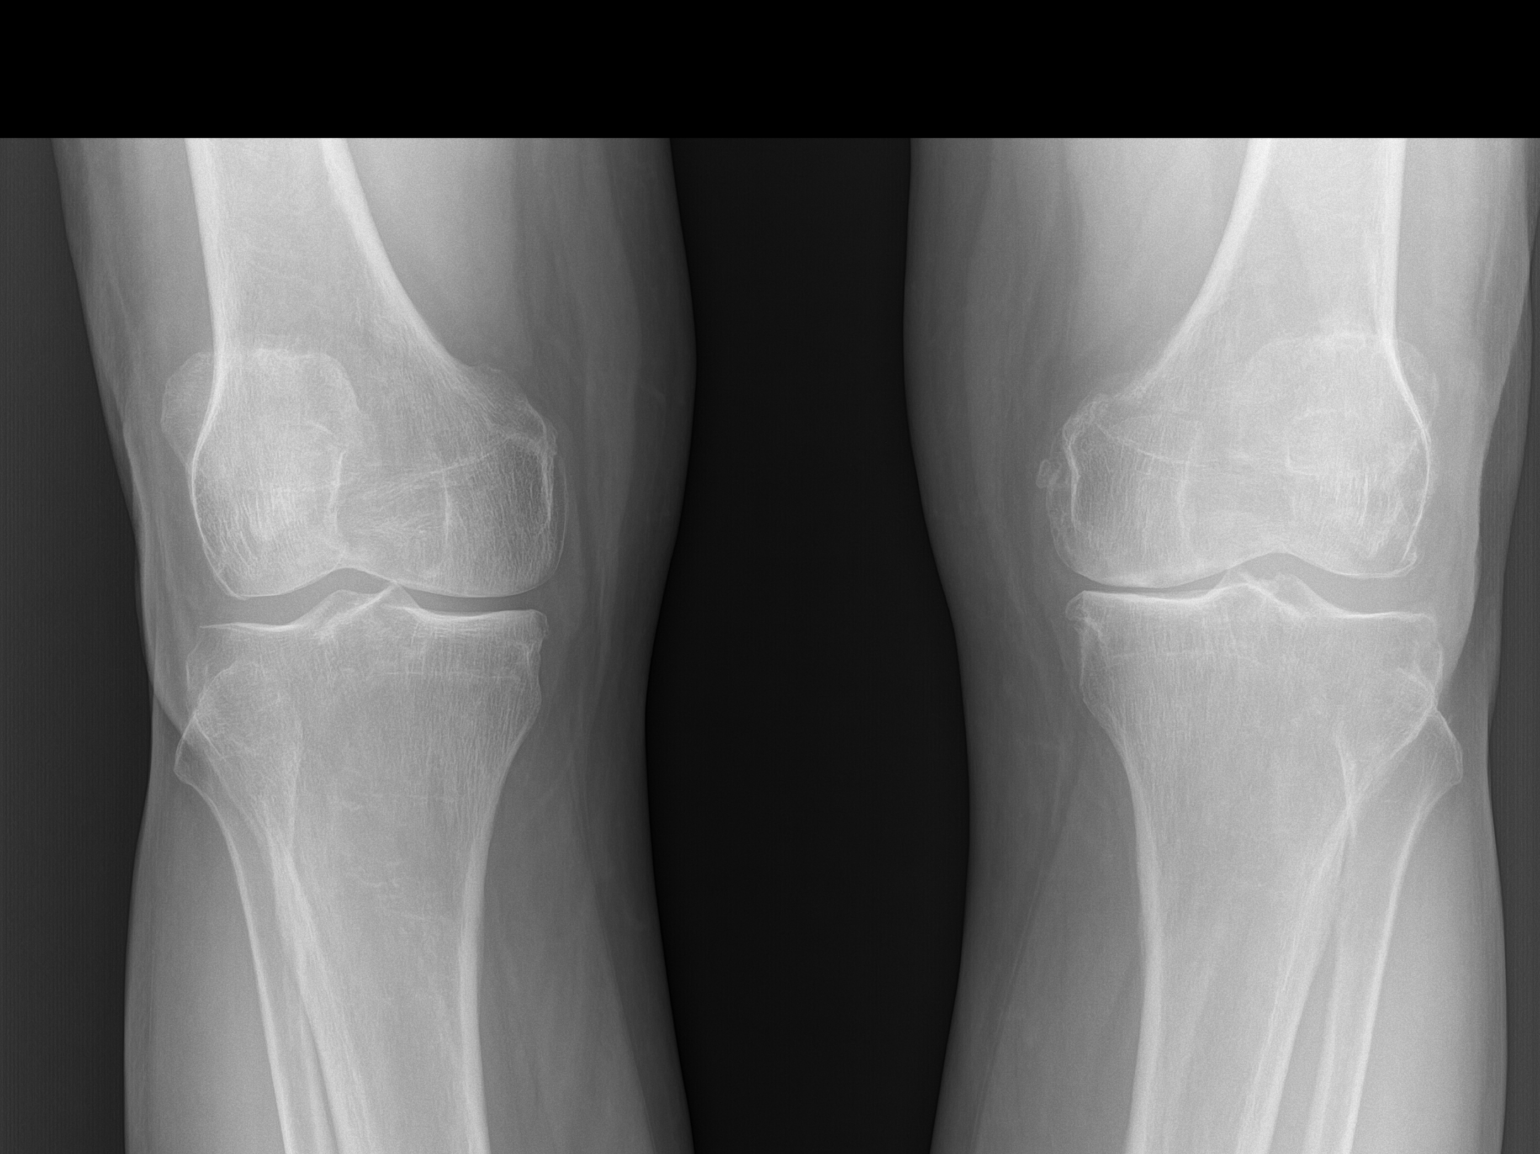

[1 of 1 positions shown; findings below may reference images not displayed]

FINDINGS: Mild joint space loss and spurring in the medial compartment of the
left knee. Minimal spurring in the lateral compartment of the left
knee. Irregularity along the medial cortex of the left medial
femoral condyle suggestive of prior medial collateral ligament
injury.

No significant abnormality of the right knee.
IMPRESSION: Mild degenerative changes of the left knee.

## 2023-07-11 ENCOUNTER — Ambulatory Visit: Payer: BC Managed Care – PPO | Admitting: Physical Therapy

## 2023-07-11 ENCOUNTER — Encounter: Payer: Self-pay | Admitting: Physical Therapy

## 2023-07-11 DIAGNOSIS — M5441 Lumbago with sciatica, right side: Secondary | ICD-10-CM | POA: Diagnosis not present

## 2023-07-11 DIAGNOSIS — M6281 Muscle weakness (generalized): Secondary | ICD-10-CM

## 2023-07-11 DIAGNOSIS — G8929 Other chronic pain: Secondary | ICD-10-CM

## 2023-07-11 DIAGNOSIS — M25562 Pain in left knee: Secondary | ICD-10-CM | POA: Diagnosis not present

## 2023-07-11 DIAGNOSIS — M25512 Pain in left shoulder: Secondary | ICD-10-CM | POA: Diagnosis not present

## 2023-07-11 DIAGNOSIS — M25511 Pain in right shoulder: Secondary | ICD-10-CM | POA: Diagnosis not present

## 2023-07-11 NOTE — Therapy (Signed)
OUTPATIENT PHYSICAL THERAPY SHOULDER TREATMENT  Patient Name: Daniel Lawrence MRN: [id number] DOB:26-Aug-1960, 62 y.o., male Today's Date: 07/11/2023  END OF SESSION:  PT End of Session - 07/11/23 0724     Visit Number 9    Number of Visits 12    Date for PT Re-Evaluation 07/16/23    PT Start Time 0724    PT Stop Time 0814    PT Time Calculation (min) 50 min    Activity Tolerance Patient tolerated treatment well    Behavior During Therapy [facility] for tasks assessed/performed              Past Medical History:  Diagnosis Date   Allergy    seasonal   Arthritis    left knee   Cancer (HCC)    skin cancer   Cataract    Hyperlipidemia    Hypertension    Past Surgical History:  Procedure Laterality Date   BRAIN SURGERY  1971   COLONOSCOPY  05/03/2021   ELEVATION OF DEPRESSED SKULL FRACTURE  07/16/1969   traumatic blow to head by golf club   , [facility])   MOHS SURGERY  09/14/2011   left temple, squamous   ROBOT ASSISTED LAPAROSCOPIC NEPHRECTOMY Right 04/20/2016   Procedure: XI ROBOTIC ASSISTED LAPAROSCOPIC RADICAL NEPHRECTOMY;  Surgeon: Sebastian Ache, MD;  Location: [facility];  Service: Urology;  Laterality: Right;   Patient Active Problem List   Diagnosis Date Noted   Tubular adenoma of colon 05/29/2023   Hearing loss due to cerumen impaction, right 05/29/2023   AC (acromioclavicular) arthritis 02/27/2023   Increased prostate specific antigen (PSA) velocity 07/03/2022   Lipoma of right shoulder 05/21/2022   Low back pain 09/15/2021   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 07/22/2021   Aortic atherosclerosis (HCC) 04/18/2021   Melanoma in situ of right upper arm (HCC) 04/18/2021   H/O right nephrectomy 04/17/2020   Degenerative arthritis of left knee 02/25/2018   Chronic pain of left knee 02/08/2018   CKD (chronic kidney disease) stage 3, GFR 30-59 ml/min (HCC) 02/08/2018   Coronary atherosclerosis due to lipid rich plaque  05/26/2016   History of renal cell carcinoma 04/20/2016   B12 deficiency 04/08/2016   Encounter for preventive health examination 06/24/2014   Hypertension 06/23/2014   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 Reports an increase in R shoulder pain while wringing out of wash cloth and heard R shoulder pop.  Pt. States this happened 6 months ago.  Pt. C/o weakness in B shoulders and limited with Yoga positions.  Pt. Received injections in B shoulders with benefit.  Pt. States "I feel weak in both shoulders".    Hand dominance: Right  PERTINENT HISTORY: Pt. Known well to PT clinic.  See previous PT notes/ MD notes.   PAIN:  Are you having pain? No c/o shoulder pain at time of evaluation.   PRECAUTIONS: None  RED FLAGS: None   WEIGHT BEARING RESTRICTIONS: No  FALLS:  Has patient fallen in last 6 months? No  LIVING ENVIRONMENT: Lives with: lives with their spouse Lives in: House/apartment Has following equipment at home: None  OCCUPATION: Research scientist (medical)  PLOF: Independent  PATIENT GOALS:  Increase B shoulder strength/ get referral to personal trainer   NEXT MD VISIT: TBD  OBJECTIVE:  Note: Objective measures were completed at Evaluation unless otherwise  noted.  DIAGNOSTIC FINDINGS:  FINDINGS: Mild glenohumeral degenerative change without loss of joint space. No other bony or soft tissue abnormalities identified.   IMPRESSION: Mild glenohumeral degenerative change without loss of joint space.     Electronically Signed   By: Gerome [name] III M.D.   On: 12/28/2022 09:37  CERVICAL SPINE - 3 VIEW   COMPARISON:  None Available.   FINDINGS: No fracture, dislocation or subluxation. No spondylolisthesis. No osteolytic or osteoblastic changes. Prevertebral and cervical cranial soft tissues are unremarkable.   Degenerative disc disease noted with disc space narrowing and marginal osteophytes at C4-6.   IMPRESSION: Degenerative changes. No acute osseous abnormalities.   Electronically Signed   By: Layla Maw M.D.   On: 03/07/2023 13:52  PATIENT SURVEYS:  FOTO initial 68/ goal 74  COGNITION: Overall cognitive status: Within functional limits for tasks assessed     SENSATION: WFL  POSTURE: Slight rounded shoulder/ pec. tightness  UPPER EXTREMITY ROM:   Active ROM Right eval Left eval  Shoulder flexion 180 deg. 180 deg.  Shoulder extension    Shoulder abduction 170 deg. 170 deg.  Shoulder adduction    Shoulder internal rotation 75 deg. 75 deg.  Shoulder external rotation 80 deg. 80 deg.  Elbow flexion WNL WNL  Elbow extension WNL WNL  Wrist flexion    Wrist extension    Wrist ulnar deviation    Wrist radial deviation    Wrist pronation    Wrist supination    (Blank rows = not tested)  Cervical AROM: flexion 57 deg./ extension 66 deg./ L lat. Flex. 54 deg./ R lat. Flex. 44 deg. (R sided lipoma)/ L rotn. 70 deg./ R rotn. 67 deg.    UPPER EXTREMITY MMT:  MMT Right eval Left eval  Shoulder flexion 4 4  Shoulder extension    Shoulder abduction 4- 4-  Shoulder adduction    Shoulder internal rotation 4+ 4+  Shoulder external rotation 4 4  Middle trapezius 4 4  Lower trapezius 4 4  Elbow flexion 5  5  Elbow extension 5 5  Wrist flexion    Wrist extension    Wrist ulnar deviation    Wrist radial deviation    Wrist pronation    Wrist supination    Grip strength (lbs) 85.9# 81.3#  (Blank rows = not tested)  SHOULDER SPECIAL TESTS: Impingement tests: Neer impingement test: negative and Hawkins/Kennedy impingement test: negative Rotator cuff assessment: Empty  can test: negative and Belly press test: negative  JOINT MOBILITY TESTING:  Good B shoulder capsular mobility (AP/PA/inf.).  No c/o pain.    PALPATION:  Negative   TODAY'S TREATMENT:                                                                                                                                         DATE: 07/11/2023   Subjective:  Pt. Reports no new issues and c/o 0.5/10 R shoulder discomfort.  Pt. Leaving for trip to [place] on 12/29 and returns 07/23/23.  Pt. Able to throw frisbee yesterday with family.    There.ex.:  B UBE 3 min. F/b.  Consistent cadence (no pain)- warm-up.  Standing BTB shoulder ER/ horizontal abduction/ diagonals 20x each.  Mirror feedback for posture/ technique.   Seated L/R UT and shoulder STM with sh./ cervical AROM reassessment.    Nautilus ex. (Use of bilateral single handles): 80# standing lat. Pull downs/ 60# tricep extension/ 60# scap. Retraction/ 50# chest press/ 40# B adduction/  15x2 each     Standing wall push ups 20x (fatigue, no c/o pain).    Supine 6# serratus punches 10x2 on L/R.     Supine pec stretches/ reassessment of capsular mobility.    Discussed HEP  PATIENT EDUCATION: Discussed goals of PT and transitioning to gym-based program as pt approaches end of POC.     PATIENT EDUCATION: Education details: Access Code: [email or web address] Person educated: Patient Education method: Explanation, Demonstration, and Handouts Education comprehension: verbalized understanding and returned demonstration  HOME EXERCISE PROGRAM: Access Code: [email or web address] URL:  [email or web address] Date: 06/04/2023 Prepared by: Dorene Grebe  Exercises - Standing Shoulder Flexion with Resistance  - 1 x daily - 4 x weekly - 2 sets - 15 reps - Standing Single Arm Shoulder Abduction with Resistance  - 1 x daily - 4 x weekly - 2 sets - 15 reps - Standing Shoulder External Rotation with Resistance  - 1 x daily - 4 x weekly - 2 sets - 15 reps  Access Code: [email or web address] URL: [email or web address] Date: 06/20/2023 Prepared by: Dorene Grebe  Exercises - Standing Shoulder Flexion with Resistance  - 1 x daily - 4 x weekly - 2 sets - 15 reps - Standing Single Arm Shoulder Abduction with Resistance  - 1 x daily - 4 x weekly - 2 sets - 15 reps - Standing Shoulder External Rotation with Resistance  - 1 x daily - 4 x weekly - 2 sets - 15 reps - Scapular Retraction with Resistance  - 1 x daily - 4 x weekly - 2 sets - 15 reps - Standing Tricep Extensions with Resistance  - 1 x daily - 4 x weekly - 2 sets - 15 reps - Prone Scapular Retraction Arms at Side  - 1 x daily - 4 x weekly - 2 sets - 10 reps - Prone Scapular Retraction Y  -  1 x daily - 4 x weekly - 2 sets - 10 reps  ASSESSMENT:  CLINICAL IMPRESSION:    Patient arrives to treatment session motivated to participate with no complaints of pain. Session focused on B UE strengthening and stabilization. Pt. Challenged with BTB and varying movement patterns with good tolerance. No increase in symptoms during session. PT will reassess pts. Goals/ discuss return to gym after return to PT after trip to [place].  Pt. Will benefit from skilled PT services to develop HEP to increase B shoulder strength to improve pain-free mobility with household tasks/ Yoga ex.  OBJECTIVE IMPAIRMENTS: decreased activity tolerance, decreased mobility, decreased strength, impaired UE functional use, improper body mechanics, postural dysfunction, and pain.   ACTIVITY LIMITATIONS: carrying, lifting, and locomotion  level  PARTICIPATION LIMITATIONS: cleaning, interpersonal relationship, community activity, and occupation  PERSONAL FACTORS: Fitness and Past/current experiences are also affecting patient's functional outcome.   REHAB POTENTIAL: Good  CLINICAL DECISION MAKING: Stable/uncomplicated  EVALUATION COMPLEXITY: Low   GOALS: Goals reviewed with patient? Yes  SHORT TERM GOALS: Target date: 06/25/23  Pt. Will be independent with HEP to increase B UE muscle strength 1/2 muscle grade to improve pain-free mobility.   Baseline:  see above Goal status: Goal met   LONG TERM GOALS: Target date: 07/16/23  Pt. Will increase FOTO to 74 to improve pain-free mobility.   Baseline: initial 68.  12/19: 74 Goal status: Goal met  2.  Pt. Able to complete plank/ upward dog yoga poses with no shoulder pain/ limitations.  Baseline: pain limited/ weakness noted Goal status: INITIAL  3.  Pt. Will progress with B UE ex. Program with assist from personal trainer to maintain functional gains from PT ex. Program.   Baseline: pt. Hoping to work with Systems analyst.  Goal status: INITIAL  PLAN:  PT FREQUENCY: 1-2x/week  PT DURATION: 6 weeks  PLANNED INTERVENTIONS: 97110-Therapeutic exercises, 97530- Therapeutic activity, [id number]- Neuromuscular re-education, 97535- Self Care, 16109- Manual therapy, 97014- Electrical stimulation (unattended), Taping, Dry Needling, Joint mobilization, Cryotherapy, and Moist heat  PLAN FOR NEXT SESSION: Goal reassessment after trip.  Pt education on carryover to commercial gym setting.     Cammie Mcgee, PT, DPT # [phone] Physical Therapist - [facility]  07/11/2023, 8:14 AM

## 2023-07-16 ENCOUNTER — Encounter: Payer: BC Managed Care – PPO | Admitting: Physical Therapy

## 2023-07-18 ENCOUNTER — Encounter: Payer: BC Managed Care – PPO | Admitting: Physical Therapy

## 2023-07-23 ENCOUNTER — Encounter: Payer: BC Managed Care – PPO | Admitting: Physical Therapy

## 2023-07-25 ENCOUNTER — Ambulatory Visit: Payer: BC Managed Care – PPO | Attending: Family Medicine | Admitting: Physical Therapy

## 2023-07-25 ENCOUNTER — Encounter: Payer: Self-pay | Admitting: Physical Therapy

## 2023-07-25 DIAGNOSIS — M25512 Pain in left shoulder: Secondary | ICD-10-CM | POA: Insufficient documentation

## 2023-07-25 DIAGNOSIS — M25511 Pain in right shoulder: Secondary | ICD-10-CM | POA: Diagnosis not present

## 2023-07-25 DIAGNOSIS — M25562 Pain in left knee: Secondary | ICD-10-CM | POA: Diagnosis not present

## 2023-07-25 DIAGNOSIS — M5441 Lumbago with sciatica, right side: Secondary | ICD-10-CM | POA: Diagnosis not present

## 2023-07-25 DIAGNOSIS — G8929 Other chronic pain: Secondary | ICD-10-CM | POA: Diagnosis not present

## 2023-07-25 DIAGNOSIS — M6281 Muscle weakness (generalized): Secondary | ICD-10-CM

## 2023-07-25 NOTE — Therapy (Signed)
 OUTPATIENT PHYSICAL THERAPY SHOULDER TREATMENT/ RECERTIFICATION Physical Therapy Progress Note  Dates of reporting period  06/04/23   to   07/25/23   Patient Name: Daniel Lawrence MRN: [id number] DOB:Aug 19, 1960, 63 y.o., male Today's Date: 07/25/2023  END OF SESSION:  PT End of Session - 07/25/23 0734     Visit Number 10    Number of Visits 14    Date for PT Re-Evaluation 08/22/23    PT Start Time 0730    PT Stop Time 0817    PT Time Calculation (min) 47 min    Activity Tolerance Patient tolerated treatment well    Behavior During Therapy [facility] for tasks assessed/performed             Past Medical History:  Diagnosis Date   Allergy    seasonal   Arthritis    left knee   Cancer (HCC)    skin cancer   Cataract    Hyperlipidemia    Hypertension    Past Surgical History:  Procedure Laterality Date   BRAIN SURGERY  1971   COLONOSCOPY  05/03/2021   ELEVATION OF DEPRESSED SKULL FRACTURE  07/16/1969   traumatic blow to head by golf club   , [facility])   MOHS SURGERY  09/14/2011   left temple, squamous   ROBOT ASSISTED LAPAROSCOPIC NEPHRECTOMY Right 04/20/2016   Procedure: XI ROBOTIC ASSISTED LAPAROSCOPIC RADICAL NEPHRECTOMY;  Surgeon: Ricardo Likens, MD;  Location: [facility];  Service: Urology;  Laterality: Right;   Patient Active Problem List   Diagnosis Date Noted   Tubular adenoma of colon 05/29/2023   Hearing loss due to cerumen impaction, right 05/29/2023   AC (acromioclavicular) arthritis 02/27/2023   Increased prostate specific antigen (PSA) velocity 07/03/2022   Lipoma of right shoulder 05/21/2022   Low back pain 09/15/2021   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 07/22/2021   Aortic atherosclerosis (HCC) 04/18/2021   Melanoma in situ of right upper arm (HCC) 04/18/2021   H/O right nephrectomy 04/17/2020   Degenerative arthritis of left knee 02/25/2018   Chronic pain of left knee 02/08/2018   CKD (chronic kidney  disease) stage 3, GFR 30-59 ml/min (HCC) 02/08/2018   Coronary atherosclerosis due to lipid rich plaque 05/26/2016   History of renal cell carcinoma 04/20/2016   B12 deficiency 04/08/2016   Encounter for preventive health examination 06/24/2014   Hypertension 06/23/2014   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 Reports an increase in R shoulder pain while wringing out of wash cloth and heard R shoulder pop.  Pt. States this happened 6 months ago.  Pt. C/o weakness in B shoulders and limited with Yoga positions.  Pt. Received injections in B shoulders with benefit.  Pt. States I feel weak in both shoulders.    Hand dominance: Right  PERTINENT HISTORY: Pt. Known well to PT clinic.  See previous PT notes/ MD notes.   PAIN:  Are you having pain? No c/o shoulder pain at time of evaluation.   PRECAUTIONS: None  RED FLAGS: None   WEIGHT BEARING RESTRICTIONS: No  FALLS:  Has patient fallen in last 6 months? No  LIVING ENVIRONMENT: Lives with: lives with their spouse Lives in: House/apartment Has following equipment at home: None  OCCUPATION: Research Scientist (medical)  PLOF: Independent  PATIENT GOALS:  Increase B shoulder strength/ get referral to personal trainer   NEXT MD  VISIT: TBD  OBJECTIVE:  Note: Objective measures were completed at Evaluation unless otherwise noted.  DIAGNOSTIC FINDINGS:  FINDINGS: Mild glenohumeral degenerative change without loss of joint space. No other bony or soft tissue abnormalities identified.   IMPRESSION: Mild glenohumeral degenerative change without loss of joint space.     Electronically Signed   By: Alm Pouch III M.D.   On: 12/28/2022 09:37  CERVICAL SPINE - 3 VIEW   COMPARISON:  None Available.   FINDINGS: No fracture, dislocation or subluxation. No spondylolisthesis. No osteolytic or osteoblastic changes. Prevertebral and cervical cranial soft tissues are unremarkable.   Degenerative disc disease noted with disc space narrowing and marginal osteophytes at C4-6.   IMPRESSION: Degenerative changes. No acute osseous abnormalities.   Electronically Signed   By: Fonda Field M.D.   On: 03/07/2023 13:52  PATIENT SURVEYS:  FOTO initial 68/ goal 74  COGNITION: Overall cognitive status: Within functional limits for tasks assessed     SENSATION: WFL  POSTURE: Slight rounded shoulder/ pec. tightness  UPPER EXTREMITY ROM:   Active ROM Right eval Left eval  Shoulder flexion 180 deg. 180 deg.  Shoulder extension    Shoulder abduction 170 deg. 170 deg.  Shoulder adduction    Shoulder internal rotation 75 deg. 75 deg.  Shoulder external rotation 80 deg. 80 deg.  Elbow flexion WNL WNL  Elbow extension WNL WNL  Wrist flexion    Wrist extension    Wrist ulnar deviation    Wrist radial deviation    Wrist pronation    Wrist supination    (Blank rows = not tested)  Cervical AROM: flexion 57 deg./ extension 66 deg./ L lat. Flex. 54 deg./ R lat. Flex. 44 deg. (R sided lipoma)/ L rotn. 70 deg./ R rotn. 67 deg.    UPPER EXTREMITY MMT:  MMT Right eval Left eval  Shoulder flexion 4 4  Shoulder extension    Shoulder abduction 4- 4-  Shoulder adduction    Shoulder internal rotation 4+  4+  Shoulder external rotation 4 4  Middle trapezius 4 4  Lower trapezius 4 4  Elbow flexion 5 5  Elbow extension 5 5  Wrist flexion    Wrist extension    Wrist ulnar deviation    Wrist radial deviation    Wrist pronation    Wrist supination    Grip strength (lbs) 85.9# 81.3#  (Blank rows = not tested)  SHOULDER SPECIAL TESTS: Impingement tests: Neer impingement test: negative and Hawkins/Kennedy impingement test: negative Rotator cuff assessment: Empty  can test: negative and Belly press test: negative  JOINT MOBILITY TESTING:  Good B shoulder capsular mobility (AP/PA/inf.).  No c/o pain.    PALPATION:  Negative   TODAY'S TREATMENT:                                                                                                                                         DATE: 07/25/2023   Subjective:  Pt. Had a nice trip to [place], [place] over New Years week.  No c/o shoulder pain at this time.  Pt. Still planning to start an exercise program with a personal trainer.  PT gave pt. A few recommendations for personal trainers in the [place] area.    There.ex.:  B UBE 3 min. F/b.  Consistent cadence (no pain)- warm-up.  Nautilus ex. (Use of bilateral single handles): 80# standing lat. Pull downs/ 60# tricep extension/ 60# scap. Retraction/ 40# B adduction  15x2 each     Reassessment of goals/ shoulder ROM and MMT.    Seated L/R UT and shoulder STM with sh./ cervical AROM reassessment.    Discussed HEP  PATIENT EDUCATION: Discussed goals of PT and transitioning to gym-based program as pt approaches end of POC.     PATIENT EDUCATION: Education details: Access Code: [email or web address] Person educated: Patient Education method: Explanation, Demonstration, and Handouts Education comprehension: verbalized understanding and returned demonstration  HOME EXERCISE PROGRAM: Access Code: [email or web address] URL: [email or web address] Date: 06/04/2023 Prepared by: Ozell Sero  Exercises -  Standing Shoulder Flexion with Resistance  - 1 x daily - 4 x weekly - 2 sets - 15 reps - Standing Single Arm Shoulder Abduction with Resistance  - 1 x daily - 4 x weekly - 2 sets - 15 reps - Standing Shoulder External Rotation with Resistance  - 1 x daily - 4 x weekly - 2 sets - 15 reps  Access Code: [email or web address] URL: [email or web address] Date: 06/20/2023 Prepared by: Ozell Sero  Exercises - Standing Shoulder Flexion with Resistance  - 1 x daily - 4 x weekly - 2 sets - 15 reps - Standing Single Arm Shoulder Abduction with Resistance  - 1 x daily - 4 x weekly - 2 sets - 15 reps - Standing Shoulder External Rotation with Resistance  - 1 x daily - 4 x weekly - 2 sets - 15 reps - Scapular Retraction with Resistance  - 1 x daily - 4 x weekly - 2 sets - 15 reps - Standing Tricep Extensions with Resistance  - 1 x daily - 4 x weekly - 2 sets - 15 reps - Prone Scapular Retraction Arms at Side  - 1 x daily - 4 x weekly - 2 sets - 10 reps - Prone Scapular Retraction Y  - 1 x daily - 4 x weekly - 2 sets - 10 reps  ASSESSMENT:  CLINICAL IMPRESSION:    Patient arrives to treatment session motivated to  participate with no complaints of pain. Session focused on B UE strengthening and stabilization. PT reviewed pts. B shoulder MMT and discussed referral to personal trainer.  No increase in symptoms during session. Pt. Will benefit from referral to personal trainer and gym based ex. Program at this time to increase B shoulder strength to improve pain-free mobility with household tasks/ Yoga ex.  OBJECTIVE IMPAIRMENTS: decreased activity tolerance, decreased mobility, decreased strength, impaired UE functional use, improper body mechanics, postural dysfunction, and pain.   ACTIVITY LIMITATIONS: carrying, lifting, and locomotion level  PARTICIPATION LIMITATIONS: cleaning, interpersonal relationship, community activity, and occupation  PERSONAL FACTORS: Fitness and Past/current experiences are  also affecting patient's functional outcome.   REHAB POTENTIAL: Good  CLINICAL DECISION MAKING: Stable/uncomplicated  EVALUATION COMPLEXITY: Low   GOALS: Goals reviewed with patient? Yes  LONG TERM GOALS: Target date: 08/22/23  Pt. Will increase FOTO to 74 to improve pain-free mobility.   Baseline: initial 68.  12/19: 74 Goal status: Goal met  2.  Pt. Able to complete plank/ upward dog yoga poses with no shoulder pain/ limitations.  Baseline: pain limited/ weakness noted Goal status: Partially met  3.  Pt. Will progress with B UE ex. Program with assist from personal trainer to maintain functional gains from PT ex. Program.   Baseline: pt. Hoping to work with systems analyst.  Goal status: Not met  PLAN:  PT FREQUENCY: 1-2x/week  PT DURATION: 4 weeks  PLANNED INTERVENTIONS: 97110-Therapeutic exercises, 97530- Therapeutic activity, 97112- Neuromuscular re-education, 97535- Self Care, 02859- Manual therapy, 97014- Electrical stimulation (unattended), Taping, Dry Needling, Joint mobilization, Cryotherapy, and Moist heat  PLAN FOR NEXT SESSION: Referral to personal trainer/ discharge   Ozell JAYSON Sero, PT, DPT # [phone] Physical Therapist - [facility]  07/25/2023, 7:53 PM

## 2023-07-30 DIAGNOSIS — R911 Solitary pulmonary nodule: Secondary | ICD-10-CM | POA: Diagnosis not present

## 2023-07-30 DIAGNOSIS — M7989 Other specified soft tissue disorders: Secondary | ICD-10-CM | POA: Diagnosis not present

## 2023-07-30 DIAGNOSIS — R918 Other nonspecific abnormal finding of lung field: Secondary | ICD-10-CM | POA: Diagnosis not present

## 2023-07-30 DIAGNOSIS — Z85820 Personal history of malignant melanoma of skin: Secondary | ICD-10-CM | POA: Diagnosis not present

## 2023-07-30 DIAGNOSIS — Z905 Acquired absence of kidney: Secondary | ICD-10-CM | POA: Diagnosis not present

## 2023-07-30 DIAGNOSIS — E785 Hyperlipidemia, unspecified: Secondary | ICD-10-CM | POA: Diagnosis not present

## 2023-07-31 ENCOUNTER — Ambulatory Visit
Admission: EM | Admit: 2023-07-31 | Discharge: 2023-07-31 | Disposition: A | Discharge status: Home / Self Care | Payer: BC Managed Care – PPO | Attending: Family Medicine | Admitting: Family Medicine

## 2023-07-31 DIAGNOSIS — J101 Influenza due to other identified influenza virus with other respiratory manifestations: Secondary | ICD-10-CM | POA: Diagnosis not present

## 2023-07-31 DIAGNOSIS — R509 Fever, unspecified: Secondary | ICD-10-CM | POA: Diagnosis not present

## 2023-07-31 LAB — RESP PANEL BY RT-PCR (RSV, FLU A&B, COVID)  RVPGX2
Influenza A by PCR: POSITIVE — AB
Influenza B by PCR: NEGATIVE
Resp Syncytial Virus by PCR: NEGATIVE
SARS Coronavirus 2 by RT PCR: NEGATIVE

## 2023-07-31 MED ORDER — OSELTAMIVIR PHOSPHATE 75 MG PO CAPS
75.0000 mg | ORAL_CAPSULE | Freq: Two times a day (BID) | ORAL | 0 refills | Status: DC
Start: 2023-07-31 — End: 2023-12-25

## 2023-07-31 NOTE — ED Triage Notes (Signed)
 Patient states he was traveling with his wife and daughter who have tested positive for the flu about a week ago. Patient states he thinks he also had the flu. States he feels some better but his wife thinks his cough has worsened and wants to get checked out.

## 2023-07-31 NOTE — Discharge Instructions (Signed)
 You have influenza A like your family.  Stop by the pharmacy and pick up the Tamiflu .    You can take Tylenol  and/or Ibuprofen as needed for fever reduction and pain relief.    For cough: honey 1 teaspoon (you can dilute the honey in water  or another fluid).  You can also use guaifenesin and dextromethorphan for cough. You can use a humidifier for chest congestion and cough.  If you don't have a humidifier, you can sit in the bathroom with the hot shower running.      For sore throat: try warm salt water  gargles, Mucinex sore throat cough drops or cepacol lozenges, throat spray, warm tea or water  with lemon/honey, popsicles or ice, or OTC cold relief medicine for throat discomfort. You can also purchase chloraseptic spray at the pharmacy or dollar store.   For congestion: take a daily anti-histamine like Zyrtec, Claritin, and a oral decongestant, such as pseudoephedrine.  You can also use Flonase  1-2 sprays in each nostril daily. Afrin is also a good option, if you do not have high blood pressure.    It is important to stay hydrated: drink plenty of fluids (water , gatorade/powerade/pedialyte, juices, or teas) to keep your throat moisturized and help further relieve irritation/discomfort.    Return or go to the Emergency Department if symptoms worsen or do not improve in the next few days

## 2023-08-01 NOTE — ED Provider Notes (Signed)
[facility]    CSN: [id number] Arrival date & time: 07/31/23  1441      History   Chief Complaint Chief Complaint  Patient presents with   Influenza    HPI Daniel Lawrence is a 63 y.o. male.   HPI  History obtained from the patient. [name] presents for ongoing cough.  He and his wife and daughter traveled to [place] for the holidays.  His wife tested positive for influenza A and his daughter has similar symptoms so they assumed that she also has the flu.  He started having symptoms a few days ago but then started to feel better but his cough got worse.  He had a fever with nasal congestion.  Sore throat has mostly resolved..  No vomiting or diarrhea.  His wife is concerned about his cough.  Patient states he is about 70% better.  Of note, he was told that he had a mass in his lung on his MRI yesterday.  Denies history of smoking.     Past Medical History:  Diagnosis Date   Allergy    seasonal   Arthritis    left knee   Cancer (HCC)    skin cancer   Cataract    Hyperlipidemia    Hypertension     Patient Active Problem List   Diagnosis Date Noted   Tubular adenoma of colon 05/29/2023   Hearing loss due to cerumen impaction, right 05/29/2023   AC (acromioclavicular) arthritis 02/27/2023   Increased prostate specific antigen (PSA) velocity 07/03/2022   Lipoma of right shoulder 05/21/2022   Low back pain 09/15/2021   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 07/22/2021   Aortic atherosclerosis (HCC) 04/18/2021   Melanoma in situ of right upper arm (HCC) 04/18/2021   H/O right nephrectomy 04/17/2020   Degenerative arthritis of left knee 02/25/2018   Chronic pain of left knee 02/08/2018   CKD (chronic kidney disease) stage 3, GFR 30-59 ml/min (HCC) 02/08/2018   Coronary atherosclerosis due to lipid rich plaque 05/26/2016   History of renal cell carcinoma 04/20/2016   B12 deficiency 04/08/2016   Encounter for  preventive health examination 06/24/2014   Hypertension 06/23/2014   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 06/23/2014   Inguinal hernia 12/10/2011   Hyperlipidemia with target LDL less than 100 12/10/2011    Past Surgical History:  Procedure Laterality Date   BRAIN SURGERY  1971   COLONOSCOPY  05/03/2021   ELEVATION OF DEPRESSED SKULL FRACTURE  07/16/1969   traumatic blow to head by golf club   , [facility])   MOHS SURGERY  09/14/2011   left temple, squamous   ROBOT ASSISTED LAPAROSCOPIC NEPHRECTOMY Right 04/20/2016   Procedure: XI ROBOTIC ASSISTED LAPAROSCOPIC RADICAL NEPHRECTOMY;  Surgeon: Sebastian Ache, MD;  Location: [facility];  Service: Urology;  Laterality: Right;       Home Medications    Prior to Admission medications   Medication Sig Start Date End Date Taking? Authorizing Provider  oseltamivir (TAMIFLU) 75 MG capsule Take 1 capsule (75 mg total) by mouth every 12 (twelve) hours. 07/31/23  Yes Frandy Basnett, DO  aspirin EC 81 MG tablet Take 81 mg by mouth daily. Swallow whole.    [provider]  Cholecalciferol (VITAMIN D3) 125 MCG (5000 UT) CAPS Take 1 capsule by mouth daily.    [provider]  Coenzyme Q10 100 MG capsule Take 100 mg by mouth daily. 02/15/21  [provider]  gabapentin (NEURONTIN) 300 MG capsule TAKE 1 CAPSULE BY MOUTH EVERYDAY AT BEDTIME 05/02/23   Judi Saa, DO  loratadine (CLARITIN) 10 MG tablet Take 10 mg by mouth daily as needed for allergies.     [provider]  losartan (COZAAR) 50 MG tablet TAKE 1 TABLET BY MOUTH EVERY DAY 02/19/23   Sherlene Shams, MD  Multiple Vitamins-Minerals (CENTRUM SILVER 50+MEN) TABS  05/30/22   [provider]  nystatin (MYCOSTATIN/NYSTOP) powder Apply 1 Application topically 2 (two) times daily. To rash until resolved. 05/29/23   Sherlene Shams, MD  Plant Sterol Stanol-Pantethine (CHOLESTOFF COMPLETE) 300-100 MG CAPS  05/31/22   [provider]  predniSONE (DELTASONE) 10 MG tablet 6 tablets daily for 3 days, then reduce by 1 tablet daily until gone 11/20/22   Sherlene Shams, MD  predniSONE (DELTASONE) 20 MG tablet Take 1 tablet (20 mg total) by mouth daily with breakfast. 07/03/23   Judi Saa, DO  rosuvastatin (CRESTOR) 20 MG tablet TAKE 1 TABLET BY MOUTH EVERY DAY 02/19/23   Sherlene Shams, MD  tadalafil (CIALIS) 5 MG tablet Take 1 tablet (5 mg total) by mouth daily. 05/29/23   Sherlene Shams, MD    Family History Family History  Problem Relation Age of Onset   Alzheimer's disease Mother    Heart disease Father        after age 67   Alzheimer's disease Maternal Aunt    Alzheimer's disease Maternal Uncle    Cancer Maternal Grandmother        breast   Cancer Maternal Grandfather        lung,  tobacco abuse   Colon cancer Neg Hx    Esophageal cancer Neg Hx    Stomach cancer Neg Hx    Colon polyps Neg Hx    Rectal cancer Neg Hx     Social History Social History   Tobacco Use   Smoking status: Never    Passive exposure: Past   Smokeless tobacco: Never  Vaping Use   Vaping status: Never Used  Substance Use Topics   Alcohol use: Yes    Alcohol/week: 4.0 standard drinks of alcohol    Types: 2 Glasses of wine, 2 Cans of beer per week    Comment: 2 glasses wine/2 beers per week   Drug use: Never     Allergies   Sulfa antibiotics   Review of Systems Review of Systems: negative unless otherwise stated in HPI.      Physical Exam Triage Vital Signs ED Triage Vitals [07/31/23 1531]  Encounter Vitals Group     BP (!) 151/82     Systolic BP Percentile      Diastolic BP Percentile      Pulse Rate 94     Resp 18     Temp 99.1 F (37.3 C)     Temp Source Oral     SpO2 98 %     Weight      Height      Head Circumference      Peak Flow      Pain Score 0     Pain Loc      Pain Education      Exclude from Growth Chart    No data found.  Updated Vital Signs BP (!) 151/82 (BP  Location: Left Arm)   Pulse 94   Temp 99.1 F (37.3 C) (Oral)   Resp 18  SpO2 98%   Visual Acuity Right Eye Distance:   Left Eye Distance:   Bilateral Distance:    Right Eye Near:   Left Eye Near:    Bilateral Near:     Physical Exam GEN:     alert, non-toxic appearing male in no distress    HENT:  mucus membranes moist, oropharyngeal without lesions or erythema, no tonsillar hypertrophy or exudates, turbinates, clear nasal discharge EYES:   no scleral injection or discharge RESP:  no increased work of breathing, clear to auscultation bilaterally though diminished on the right CVS:   regular rate and rhythm Skin:   warm and dry    UC Treatments / Results  Labs (all labs ordered are listed, but only abnormal results are displayed) Labs Reviewed  RESP PANEL BY RT-PCR (RSV, FLU A&B, COVID)  RVPGX2 - Abnormal; Notable for the following components:      Result Value   Influenza A by PCR POSITIVE (*)    All other components within normal limits    EKG   Radiology No results found.  Procedures Procedures (including critical care time)  Medications Ordered in UC Medications - No data to display  Initial Impression / Assessment and Plan / UC Course  I have reviewed the triage vital signs and the nursing notes.  Pertinent labs & imaging results that were available during my care of the patient were reviewed by me and considered in my medical decision making (see chart for details).       Pt is a 63 y.o. male who presents for cough after influenza exposure.  Shrish is afebrile here . Satting well on room air. Overall pt is non-toxic appearing, well hydrated, without respiratory distress. Pulmonary exam is remarkable for diminished breathe sounds on the right.  COVID, RSV and influenza panel obtained and was positive for influenza A.  He declined a chest x-ray here today.  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  It was noted that it was not arising from the T1 nerve.  A CT of the chest was recommended.  Patient states that he will get this ordered by his primary care doctor.  Of note, his January 2024 CT chest had a right apical pleural nodular area that was corresponding with a vessel at that time.  He did have a right lower lobe tiny pulmonary nodule measuring 2 mm.  He has no history of smoking.  Recommended he follow-up with his PCP or pulmonologist to get this evaluated.  Discussed symptomatic treatment.  Tamiflu prescribed as he may be deemed immunocompromised given this concern for cancer.  Typical duration of symptoms discussed.   Return and ED precautions given and voiced understanding. Discussed MDM, treatment plan and plan for follow-up with patient who agrees with plan.     Final Clinical Impressions(s) / UC Diagnoses   Final diagnoses:  Influenza A     Discharge Instructions      You have influenza A like your family.  Stop by the pharmacy and pick up the Tamiflu.    You can take Tylenol and/or Ibuprofen as needed for fever reduction and pain relief.    For cough: honey 1 teaspoon (you can dilute the honey in water or another fluid).  You can also use guaifenesin and dextromethorphan for cough. You can use a humidifier for chest congestion  and cough.  If you don't have a humidifier, you can sit in the bathroom with the hot shower running.      For sore throat: try warm salt water gargles, Mucinex sore throat cough drops or cepacol lozenges, throat spray, warm tea or water with lemon/honey, popsicles or ice, or OTC cold relief medicine for throat discomfort. You can also purchase chloraseptic spray at the pharmacy or dollar store.   For congestion: take a daily anti-histamine like Zyrtec, Claritin, and a oral decongestant, such as pseudoephedrine.  You can also use Flonase 1-2 sprays in each  nostril daily. Afrin is also a good option, if you do not have high blood pressure.    It is important to stay hydrated: drink plenty of fluids (water, gatorade/powerade/pedialyte, juices, or teas) to keep your throat moisturized and help further relieve irritation/discomfort.    Return or go to the Emergency Department if symptoms worsen or do not improve in the next few days     ED Prescriptions     Medication Sig Dispense Auth. Provider   oseltamivir (TAMIFLU) 75 MG capsule Take 1 capsule (75 mg total) by mouth every 12 (twelve) hours. 10 capsule Katha Cabal, DO      PDMP not reviewed this encounter.   Katha Cabal, DO 08/01/23 1031

## 2023-08-14 DIAGNOSIS — R911 Solitary pulmonary nodule: Secondary | ICD-10-CM | POA: Diagnosis not present

## 2023-08-14 DIAGNOSIS — R918 Other nonspecific abnormal finding of lung field: Secondary | ICD-10-CM | POA: Diagnosis not present

## 2023-08-27 DIAGNOSIS — L578 Other skin changes due to chronic exposure to nonionizing radiation: Secondary | ICD-10-CM | POA: Diagnosis not present

## 2023-08-27 DIAGNOSIS — L280 Lichen simplex chronicus: Secondary | ICD-10-CM | POA: Diagnosis not present

## 2023-08-27 DIAGNOSIS — L821 Other seborrheic keratosis: Secondary | ICD-10-CM | POA: Diagnosis not present

## 2023-08-27 DIAGNOSIS — D225 Melanocytic nevi of trunk: Secondary | ICD-10-CM | POA: Diagnosis not present

## 2023-08-28 ENCOUNTER — Ambulatory Visit: Payer: BC Managed Care – PPO | Admitting: Family Medicine

## 2023-09-03 DIAGNOSIS — R918 Other nonspecific abnormal finding of lung field: Secondary | ICD-10-CM | POA: Diagnosis not present

## 2023-09-04 IMAGING — MR MR LUMBAR SPINE W/O CM
4 of 5 series · 18 of 48 positions shown · non-contrast
Comparison: Lumbar radiographs 11/02/2021, CT abdomen/pelvis
04/02/2016

CLINICAL DATA: Sciatica and right posterior leg, numbness in right
toes for 3 months

EXAM:
MRI LUMBAR SPINE WITHOUT CONTRAST
TECHNIQUE: Multiplanar, multisequence MR imaging of the lumbar spine was
performed. No intravenous contrast was administered.

[Series 6: T2 · sagittal · 4.0mm · 0.73mm/px · 6 of 15 slices shown (1 of 2)]
[im 1/15]
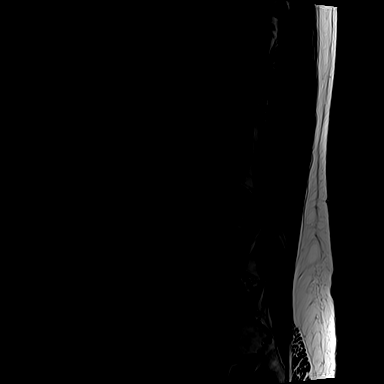
[im 3/15]
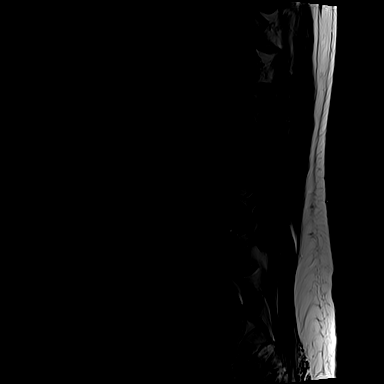
[im 6/15]
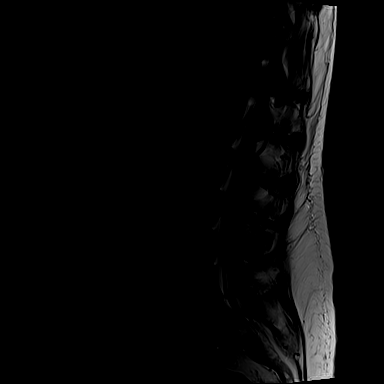
[im 9/15]
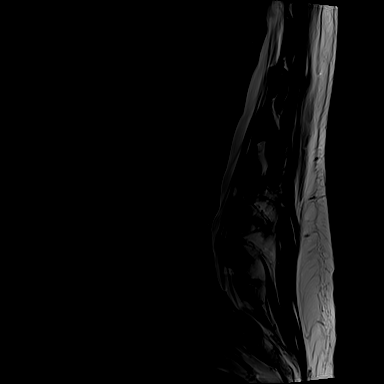
[im 12/15]
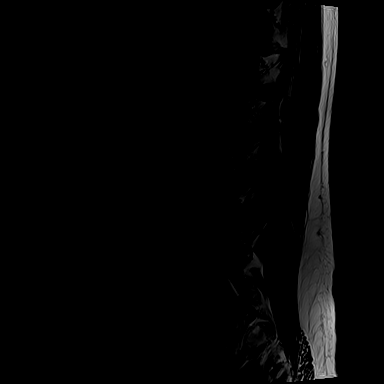
[im 15/15]
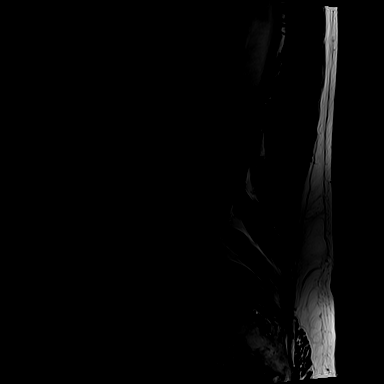

[Series 7: T1 · sagittal · 4.0mm · 0.73mm/px · 3 of 15 slices shown (1 of 2)]
[im 1/15]
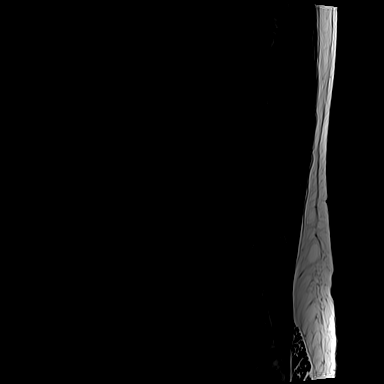
[im 8/15]
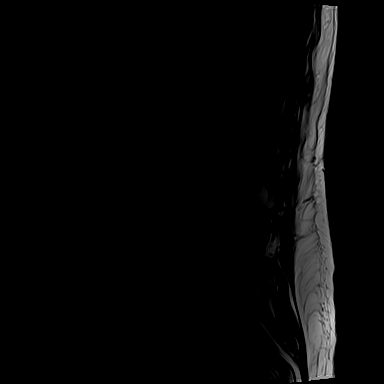
[im 15/15]
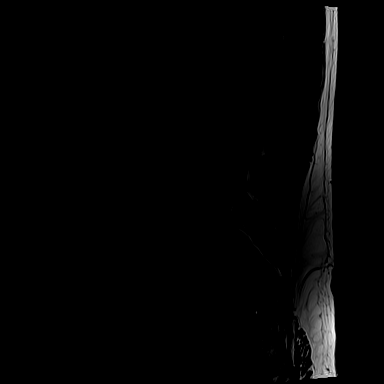

[Series 11: T1 · axial · 4.0mm · 0.28mm/px · z∈[-27,+140]mm · 3 of 43 slices shown (2 of 2)]
[im 6/43]
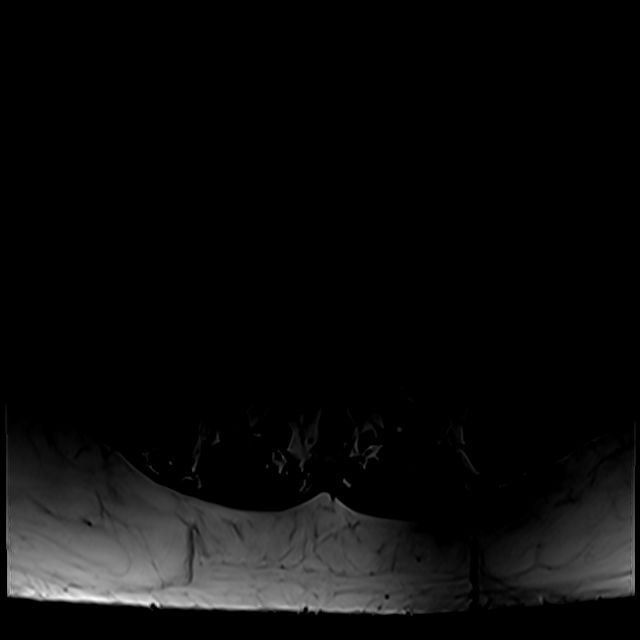
[im 23/43]
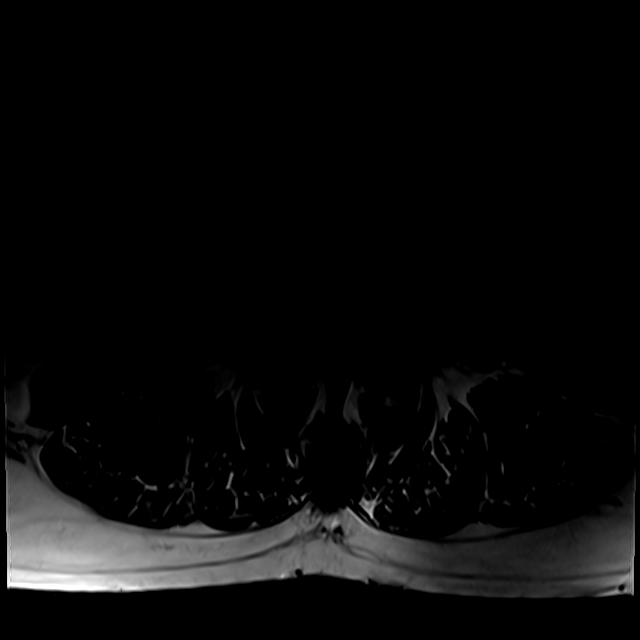
[im 37/43]
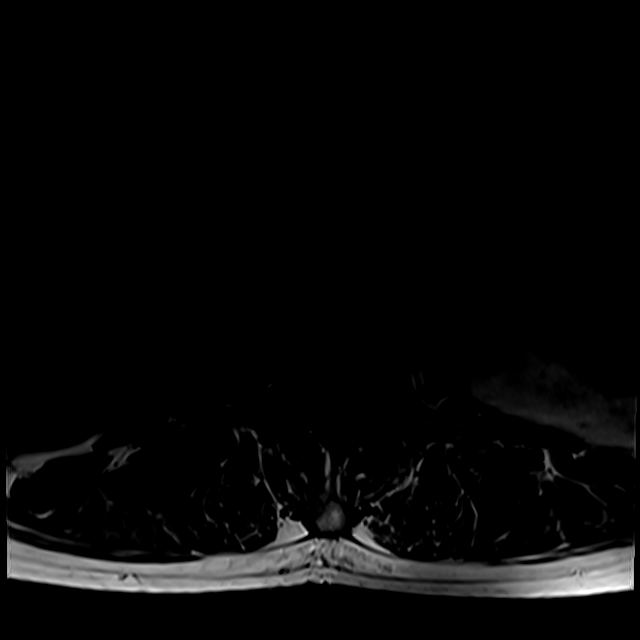

[Series 14: T2 · axial · 4.0mm · 0.28mm/px · z∈[-42,+140]mm · 6 of 43 slices shown (2 of 2)]
[im 3/43]
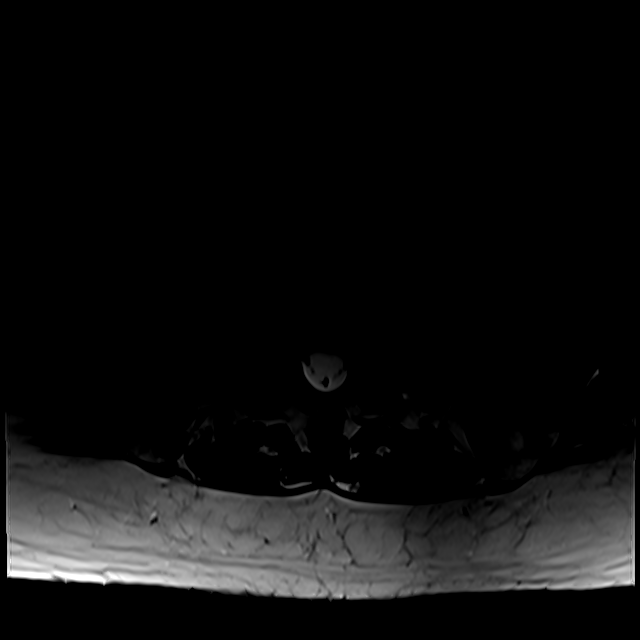
[im 6/43]
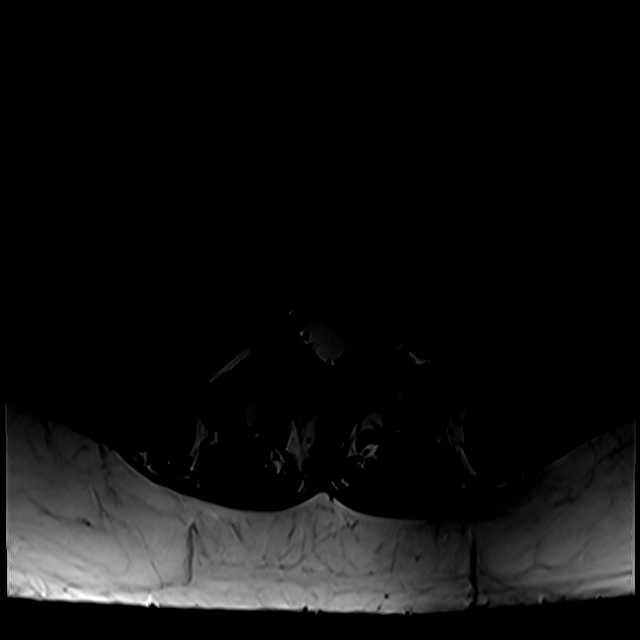
[im 9/43]
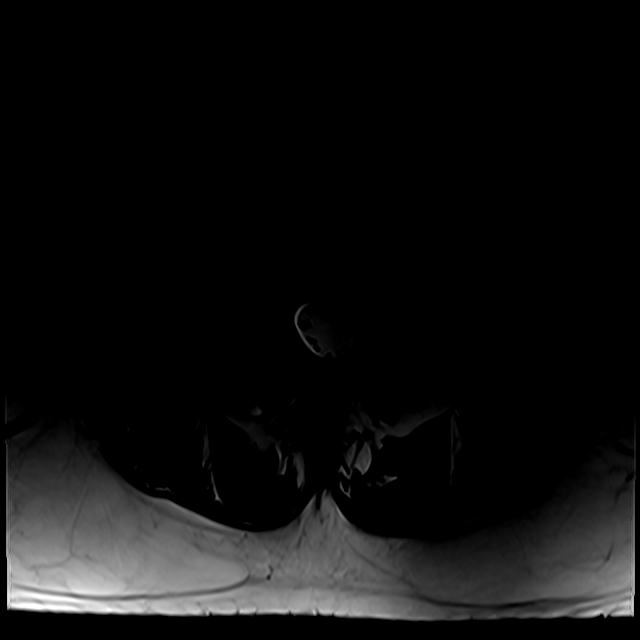
[im 15/43]
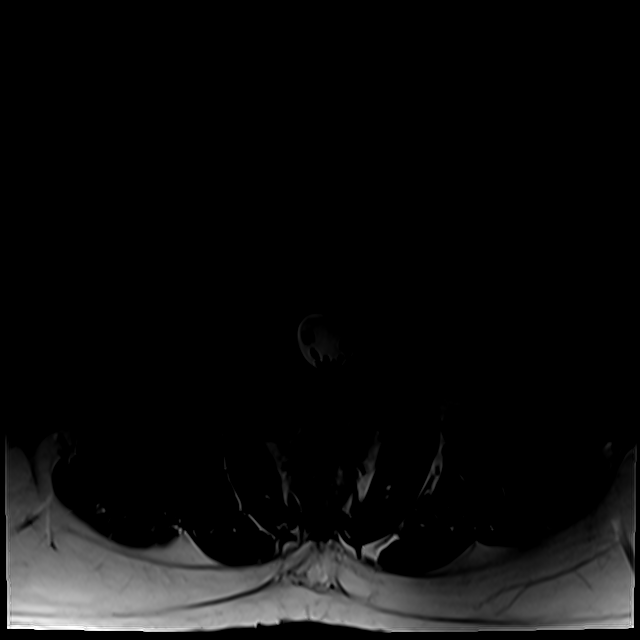
[im 23/43]
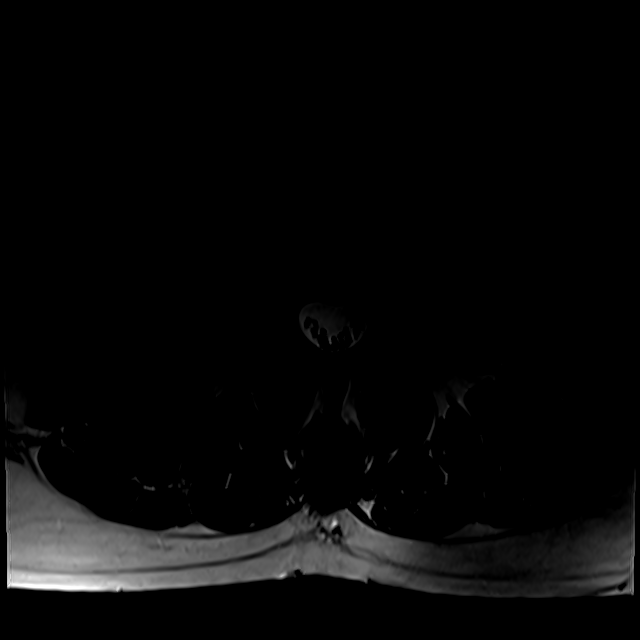
[im 37/43]
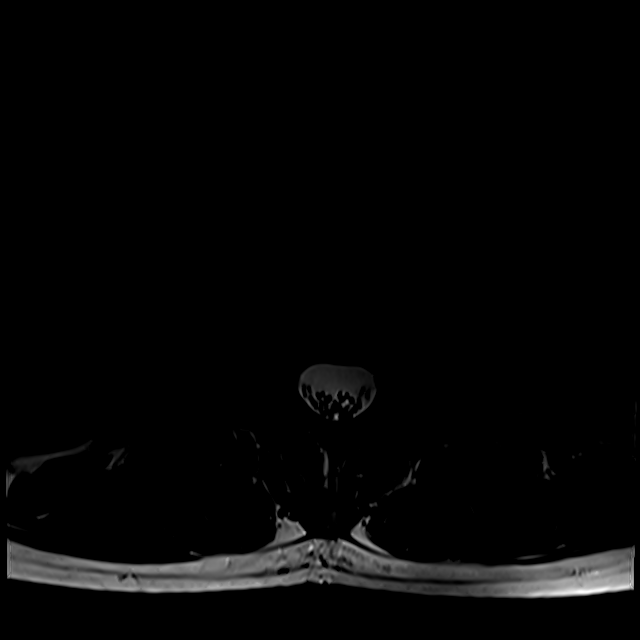

[18 of 48 positions shown; findings below may reference images not displayed]

FINDINGS: Segmentation: Standard; the lowest formed disc space is designated
L5-S1.

Alignment: There is mild levocurvature centered at L1-L2. There is
trace retrolisthesis of L3 on L4.

Vertebrae: There is anterior compression deformity of the T12
vertebral body with up to approximately 20% loss of vertebral body
height anteriorly but no bony retropulsion. This is unchanged since
the CT from 7408. Vertebral body heights are otherwise preserved.
There are scattered intraosseous hemangiomas, the largest in the L2
vertebral body. There is degenerative endplate marrow signal
abnormality at L3-L4. There is no suspicious marrow signal
abnormality or marrow edema.

Conus medullaris and cauda equina: Conus extends to the L1-L2 level.
Conus and cauda equina appear normal.

Paraspinal and other soft tissues: Unremarkable.

Disc levels:

There is disc desiccation and narrowing most advanced at L3-L4.
There is mild desiccation without significant loss of height at the
other levels.

T12-L1: No significant spinal canal or neural foraminal stenosis.

L1-L2: No significant spinal canal or neural foraminal stenosis

L2-L3: Mild bilateral facet arthropathy without significant spinal
canal or neural foraminal stenosis

L3-L4: There is trace retrolisthesis with degenerative endplate
change, a mild disc bulge, and mild bilateral facet arthropathy
resulting in mild-to-moderate left and mild right neural foraminal
stenosis without significant spinal canal stenosis.

L4-L5: There is moderate bilateral facet arthropathy with small
effusions resulting in mild left and no significant right neural
foraminal stenosis and no significant spinal canal stenosis.

L5-S1: There is moderate bilateral facet arthropathy with prominent
effusions without significant spinal canal or neural foraminal
stenosis.
IMPRESSION: 1. Disc degeneration most advanced at L3-L4 with trace
retrolisthesis, a mild bulge, and bilateral facet arthropathy
resulting in mild-to-moderate left and mild right neural foraminal
stenosis.
2. Moderate facet arthropathy at L4-L5 and L5-S1 with associated
effusions and mild left neural foraminal stenosis at L4-L5.
3. Compression deformity of the T12 vertebral body is unchanged
since [DATE].

## 2023-09-09 DIAGNOSIS — R918 Other nonspecific abnormal finding of lung field: Secondary | ICD-10-CM | POA: Diagnosis not present

## 2023-09-09 DIAGNOSIS — N183 Chronic kidney disease, stage 3 unspecified: Secondary | ICD-10-CM | POA: Diagnosis not present

## 2023-09-09 DIAGNOSIS — Z8553 Personal history of malignant neoplasm of renal pelvis: Secondary | ICD-10-CM | POA: Diagnosis not present

## 2023-09-09 DIAGNOSIS — I10 Essential (primary) hypertension: Secondary | ICD-10-CM | POA: Diagnosis not present

## 2023-09-09 DIAGNOSIS — I129 Hypertensive chronic kidney disease with stage 1 through stage 4 chronic kidney disease, or unspecified chronic kidney disease: Secondary | ICD-10-CM | POA: Diagnosis not present

## 2023-09-09 DIAGNOSIS — Z01818 Encounter for other preprocedural examination: Secondary | ICD-10-CM | POA: Diagnosis not present

## 2023-09-09 DIAGNOSIS — Z905 Acquired absence of kidney: Secondary | ICD-10-CM | POA: Diagnosis not present

## 2023-09-16 ENCOUNTER — Encounter: Payer: Self-pay | Admitting: Internal Medicine

## 2023-10-03 ENCOUNTER — Ambulatory Visit: Payer: BC Managed Care – PPO | Admitting: Family Medicine

## 2023-10-08 DIAGNOSIS — M5431 Sciatica, right side: Secondary | ICD-10-CM | POA: Diagnosis not present

## 2023-10-08 DIAGNOSIS — I129 Hypertensive chronic kidney disease with stage 1 through stage 4 chronic kidney disease, or unspecified chronic kidney disease: Secondary | ICD-10-CM | POA: Diagnosis not present

## 2023-10-08 DIAGNOSIS — N183 Chronic kidney disease, stage 3 unspecified: Secondary | ICD-10-CM | POA: Diagnosis not present

## 2023-10-08 DIAGNOSIS — D492 Neoplasm of unspecified behavior of bone, soft tissue, and skin: Secondary | ICD-10-CM | POA: Diagnosis not present

## 2023-10-08 DIAGNOSIS — Z85528 Personal history of other malignant neoplasm of kidney: Secondary | ICD-10-CM | POA: Diagnosis not present

## 2023-10-08 DIAGNOSIS — Z85828 Personal history of other malignant neoplasm of skin: Secondary | ICD-10-CM | POA: Diagnosis not present

## 2023-10-08 DIAGNOSIS — E785 Hyperlipidemia, unspecified: Secondary | ICD-10-CM | POA: Diagnosis not present

## 2023-10-08 DIAGNOSIS — C493 Malignant neoplasm of connective and soft tissue of thorax: Secondary | ICD-10-CM | POA: Diagnosis not present

## 2023-10-08 DIAGNOSIS — Z7982 Long term (current) use of aspirin: Secondary | ICD-10-CM | POA: Diagnosis not present

## 2023-10-08 DIAGNOSIS — Z4682 Encounter for fitting and adjustment of non-vascular catheter: Secondary | ICD-10-CM | POA: Diagnosis not present

## 2023-10-08 DIAGNOSIS — Z8545 Personal history of malignant neoplasm of unspecified male genital organ: Secondary | ICD-10-CM | POA: Diagnosis not present

## 2023-10-08 DIAGNOSIS — Z905 Acquired absence of kidney: Secondary | ICD-10-CM | POA: Diagnosis not present

## 2023-10-08 DIAGNOSIS — Z882 Allergy status to sulfonamides status: Secondary | ICD-10-CM | POA: Diagnosis not present

## 2023-10-08 DIAGNOSIS — R222 Localized swelling, mass and lump, trunk: Secondary | ICD-10-CM | POA: Diagnosis not present

## 2023-10-08 DIAGNOSIS — Z881 Allergy status to other antibiotic agents status: Secondary | ICD-10-CM | POA: Diagnosis not present

## 2023-10-08 DIAGNOSIS — Z85820 Personal history of malignant melanoma of skin: Secondary | ICD-10-CM | POA: Diagnosis not present

## 2023-10-08 DIAGNOSIS — J939 Pneumothorax, unspecified: Secondary | ICD-10-CM | POA: Diagnosis not present

## 2023-10-08 DIAGNOSIS — Z79899 Other long term (current) drug therapy: Secondary | ICD-10-CM | POA: Diagnosis not present

## 2023-10-08 DIAGNOSIS — R918 Other nonspecific abnormal finding of lung field: Secondary | ICD-10-CM | POA: Diagnosis not present

## 2023-10-15 ENCOUNTER — Ambulatory Visit: Attending: Family Medicine | Admitting: Occupational Therapy

## 2023-10-15 ENCOUNTER — Encounter: Payer: Self-pay | Admitting: Occupational Therapy

## 2023-10-15 DIAGNOSIS — M25641 Stiffness of right hand, not elsewhere classified: Secondary | ICD-10-CM | POA: Insufficient documentation

## 2023-10-15 DIAGNOSIS — M25631 Stiffness of right wrist, not elsewhere classified: Secondary | ICD-10-CM | POA: Insufficient documentation

## 2023-10-15 DIAGNOSIS — M79601 Pain in right arm: Secondary | ICD-10-CM | POA: Diagnosis not present

## 2023-10-15 DIAGNOSIS — M6281 Muscle weakness (generalized): Secondary | ICD-10-CM | POA: Diagnosis not present
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
## 2023-10-17 ENCOUNTER — Ambulatory Visit: Admitting: Occupational Therapy

## 2023-10-17 DIAGNOSIS — M25631 Stiffness of right wrist, not elsewhere classified: Secondary | ICD-10-CM | POA: Diagnosis not present

## 2023-10-17 DIAGNOSIS — M25641 Stiffness of right hand, not elsewhere classified: Secondary | ICD-10-CM | POA: Diagnosis not present

## 2023-10-17 DIAGNOSIS — M6281 Muscle weakness (generalized): Secondary | ICD-10-CM | POA: Diagnosis not present

## 2023-10-17 DIAGNOSIS — M79601 Pain in right arm: Secondary | ICD-10-CM | POA: Diagnosis not present
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
## 2023-10-21 DIAGNOSIS — R531 Weakness: Secondary | ICD-10-CM | POA: Diagnosis not present

## 2023-10-21 DIAGNOSIS — J939 Pneumothorax, unspecified: Secondary | ICD-10-CM | POA: Diagnosis not present

## 2023-10-21 DIAGNOSIS — Z79899 Other long term (current) drug therapy: Secondary | ICD-10-CM | POA: Diagnosis not present

## 2023-10-21 DIAGNOSIS — R918 Other nonspecific abnormal finding of lung field: Secondary | ICD-10-CM | POA: Diagnosis not present

## 2023-10-21 DIAGNOSIS — M25431 Effusion, right wrist: Secondary | ICD-10-CM | POA: Diagnosis not present

## 2023-10-21 DIAGNOSIS — J90 Pleural effusion, not elsewhere classified: Secondary | ICD-10-CM | POA: Diagnosis not present

## 2023-10-21 DIAGNOSIS — D492 Neoplasm of unspecified behavior of bone, soft tissue, and skin: Secondary | ICD-10-CM | POA: Diagnosis not present

## 2023-10-22 ENCOUNTER — Ambulatory Visit: Admitting: Occupational Therapy

## 2023-10-22 DIAGNOSIS — M25641 Stiffness of right hand, not elsewhere classified: Secondary | ICD-10-CM | POA: Diagnosis not present

## 2023-10-22 DIAGNOSIS — M25631 Stiffness of right wrist, not elsewhere classified: Secondary | ICD-10-CM | POA: Diagnosis not present

## 2023-10-22 DIAGNOSIS — M79601 Pain in right arm: Secondary | ICD-10-CM

## 2023-10-22 DIAGNOSIS — M25431 Effusion, right wrist: Secondary | ICD-10-CM | POA: Diagnosis not present

## 2023-10-22 DIAGNOSIS — M6281 Muscle weakness (generalized): Secondary | ICD-10-CM

## 2023-10-22 DIAGNOSIS — M7989 Other specified soft tissue disorders: Secondary | ICD-10-CM | POA: Diagnosis not present
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
## 2023-10-24 ENCOUNTER — Ambulatory Visit: Admitting: Occupational Therapy

## 2023-10-25 DIAGNOSIS — L820 Inflamed seborrheic keratosis: Secondary | ICD-10-CM | POA: Diagnosis not present

## 2023-10-28 ENCOUNTER — Ambulatory Visit: Admitting: Occupational Therapy

## 2023-10-28 DIAGNOSIS — M25641 Stiffness of right hand, not elsewhere classified: Secondary | ICD-10-CM

## 2023-10-28 DIAGNOSIS — M79601 Pain in right arm: Secondary | ICD-10-CM | POA: Diagnosis not present

## 2023-10-28 DIAGNOSIS — M25631 Stiffness of right wrist, not elsewhere classified: Secondary | ICD-10-CM

## 2023-10-28 DIAGNOSIS — M6281 Muscle weakness (generalized): Secondary | ICD-10-CM | POA: Diagnosis not present
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
## 2023-10-31 ENCOUNTER — Ambulatory Visit: Admitting: Occupational Therapy

## 2023-10-31 DIAGNOSIS — M25641 Stiffness of right hand, not elsewhere classified: Secondary | ICD-10-CM | POA: Diagnosis not present

## 2023-10-31 DIAGNOSIS — M79601 Pain in right arm: Secondary | ICD-10-CM | POA: Diagnosis not present

## 2023-10-31 DIAGNOSIS — M6281 Muscle weakness (generalized): Secondary | ICD-10-CM | POA: Diagnosis not present

## 2023-10-31 DIAGNOSIS — M25631 Stiffness of right wrist, not elsewhere classified: Secondary | ICD-10-CM
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
## 2023-11-05 ENCOUNTER — Ambulatory Visit: Admitting: Occupational Therapy

## 2023-11-05 DIAGNOSIS — M79601 Pain in right arm: Secondary | ICD-10-CM

## 2023-11-05 DIAGNOSIS — M25641 Stiffness of right hand, not elsewhere classified: Secondary | ICD-10-CM

## 2023-11-05 DIAGNOSIS — M25631 Stiffness of right wrist, not elsewhere classified: Secondary | ICD-10-CM

## 2023-11-05 DIAGNOSIS — M6281 Muscle weakness (generalized): Secondary | ICD-10-CM

## 2023-11-05 NOTE — Therapy (Signed)
 OUTPATIENT OCCUPATIONAL THERAPY ORTHO TREATMENT  Patient Name: Daniel Lawrence MRN: [id number] DOB:02-04-1961, 63 y.o., male Today's Date: 11/05/2023  PCP: Dr Madelon Scheuermann REFERRING PROVIDER:Dr Vickey Grammes  END OF SESSION:  OT End of Session - 11/05/23 1448     Visit Number 6    Number of Visits 16    Date for OT Re-Evaluation 01/07/24    OT Start Time 1448    Activity Tolerance Patient tolerated treatment well    Behavior During Therapy [facility] for tasks assessed/performed             Past Medical History:  Diagnosis Date   Allergy    seasonal   Arthritis    left knee   Cancer (HCC)    skin cancer   Cataract    Hyperlipidemia    Hypertension    Past Surgical History:  Procedure Laterality Date   BRAIN SURGERY  1971   COLONOSCOPY  05/03/2021   ELEVATION OF DEPRESSED SKULL FRACTURE  07/16/1969   traumatic blow to head by golf club   , [facility])   MOHS SURGERY  09/14/2011   left temple, squamous   ROBOT ASSISTED LAPAROSCOPIC NEPHRECTOMY Right 04/20/2016   Procedure: XI ROBOTIC ASSISTED LAPAROSCOPIC RADICAL NEPHRECTOMY;  Surgeon: Osborn Blaze, MD;  Location: [facility];  Service: Urology;  Laterality: Right;   Patient Active Problem List   Diagnosis Date Noted   Tubular adenoma of colon 05/29/2023   Hearing loss due to cerumen impaction, right 05/29/2023   AC (acromioclavicular) arthritis 02/27/2023   Increased prostate specific antigen (PSA) velocity 07/03/2022   Lipoma of right shoulder 05/21/2022   Low back pain 09/15/2021   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 07/22/2021   Aortic atherosclerosis (HCC) 04/18/2021   Melanoma in situ of right upper arm (HCC) 04/18/2021   H/O right nephrectomy 04/17/2020   Degenerative arthritis of left knee 02/25/2018   Chronic pain of left knee 02/08/2018   CKD (chronic kidney disease) stage 3, GFR 30-59 ml/min (HCC) 02/08/2018   Coronary atherosclerosis due to lipid rich plaque 05/26/2016    History of renal cell carcinoma 04/20/2016   B12 deficiency 04/08/2016   Encounter for preventive health examination 06/24/2014   Hypertension 06/23/2014   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  Mostly at At medial elbow and wrist and hand. PRECAUTIONS: Patient to follow surgeon's precautions    WEIGHT BEARING RESTRICTIONS: no  PAIN:  Are you having pain?6-7/10 nerve pain in R UE  FALLS: Has patient fallen in last 6 months? No  LIVING ENVIRONMENT: Lives with: lives with their spouse  PLOF: Patient prior to surgery had normal active range of motion and strength.  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  Working and teaching yoga and doing things around the house  NEXT MD VISIT: 17 April 25  OBJECTIVE:  Note: Objective measures were completed at Evaluation unless otherwise noted.  HAND DOMINANCE: Right  ADLs: Patient mostly limited by wrist and hand weakness.  Unable to grip objects and pick it up with weight as well as fine motor unable to pick up small objects.    UPPER EXTREMITY ROM at eval     Patient with  decreased proprioception and coordination in right upper extremity range of motion.  Slow and deliberate. Pain and tenderness over right cubital tunnel Increased pain and tenderness over Guyon's canal As well as tenderness over carpal tunnel at volar wrist Bruising present over dorsal wrist. Patient reports he had some IVs in dorsal R wrist  And had a lot of swelling in R hand and wrist postop composite nerve glide and shoulder abduction had increased pain at stage 4 of 5.  Pain from elbow into wrist and hand.    Active ROM Right eval Left eval  Shoulder flexion    Shoulder abduction    Shoulder adduction    Shoulder extension    Shoulder internal rotation    Shoulder external rotation    Elbow flexion    Elbow extension    Wrist flexion 84   Wrist extension 64   Wrist ulnar deviation 20   Wrist radial deviation 25   Wrist pronation 90   Wrist supination 90      At eval  Extension or tapping of digits on right hand of table needs passive range of motion and placed on hold for second digit.  3rd through 5th 3 -/5 strength Lumbrical fist decreased PIP extension Intrinsic a fist within functional limits 3rd through 5th decrease in second digit Decrease strength for dorsal and volar interossei Composite fist three-quarter range Opening of digits limited at PIP extension after composite fist.  Active ROM Right eval Left eval R 10/31/23 R 11/05/23  Thumb MCP (0-60) 35     Thumb IP (0-80) 10     Thumb Radial abd/add (0-55) WFL      Thumb Palmar abd/add (0-45) impaired      Thumb Opposition to Small Finger Unable to do any opposition even to second digit   Opposed to middle phalanges of second digit Pick up 2 cm foam block to 3rd   Index MCP (0-90)    75 80  Index PIP (0-100)    35 35  Index DIP (0-70)        Long MCP (0-90)     75 80  Long PIP (0-100)     60 65  Long DIP (0-70)        Ring MCP (0-90)     80 85  Ring PIP (0-100)     75 85  Ring DIP (0-70)        Little  MCP (0-90)     80 90  Little PIP (0-100)     75 90  Little DIP (0-70)        (Blank rows = not tested)  HAND FUNCTION: Not tested evaluation  11/05/23 Grip strength: Right: 0 lbs; Left: 76 lbs, Lateral pinch: Right: 0 lbs, Left:   lbs, and 3 point pinch: Right: 0 lbs, Left:   lbs  COORDINATION: Impaired unable to make composite fist unable  to do 2. Or 3 point pinch  SENSATION: Numbness in ulnar forearm into hand and fifth digit and ulnar side of fourth Report some pins-and-needles in DIP of third Patient with increased pain and tenderness over cubital tunnel as well as Guyon's canal.  Tenderness over volar wrist carpal tunnel  EDEMA: Patient report had severe edema postop in right hand and wrist.  Continue to have some bruising over her dorsal wrist  COGNITION: Overall cognitive status: WNL      TREATMENT DATE: 11/05/23         Noticed patient arrive with right forearm internally rotate with palm posteriorly. Shoulder and scapula in good posture. Patient trying to keep right hand positioned externally rotated and supination  patient reports he is trying to use the right hand as much.  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 Felt good more normal. Patient had questions about anatomy of nerve as well as nerve healing-continue to be limited a lot with nerve pain   Feels good to sleep with his arm overhead.  Remind patient to not flex elbow more than 90 degrees.  As long as sensory symptoms not increasing                                                                                                              Assessment of ulnar nerve glide in front of patient continues to show full elbow extension with increased wrist extension and less flexion of fourth fifth digits  continue  with ulnar nerve glide breaking it up in proximal and then distal and then composite 5 reps each and can assist endrange if no increase in sensory symptoms  Continue to have increased stabilization at wrist with supination/pronation -2 pound cuff weight with elbow unsupported  Continue with 2 pound cuff weight upgrade for horizontal focusing on ulnar deviation. Was able in the clinic to do cuff weight 2 pounds for wrist extension horizontal focusing on dropping wrist in neutral.  Patient to continue with green Thera-Band with the modification of wrap around the wrist pulling through the fingers patient to do scapular retraction as well as LAT pull downs 2 sets of 12 External rotation 2 sets of 12 Upgrade patient to cuff 2 pound weight on hand with shoulder flexion and abduction with ball on the wall 1 sets of 12.  Patient tolerating well. Add to home exercises. Patient continues to have some postop swellin in the right axilla and thoracic    Patient to continue contrast performance of 2 minutes heat and warm and ice. 3 rotations.   2-3 times a day to right elbow and hand or wrist.  Prior to home exercises    Place and hold her active assisted range of motion for thumb palmar abduction-patient this date able to maintain palmar abduction.  10 reps Upgrade to  rubber band for palmar abduction with placing hold 10 reps Radial abduction rubber band on table sliding place and hold 10 reps  Rolling putty for digit extension before digit extension tapping of table 8-10 reps  Patient show increased digit extension of the table for 3rd through 5th.  Still needs some active assisted range of motion placed on hold for second digit   Reviewed with patient again for passive range of motion composite flexion of digit.  Followed by placing hold. After 2 sets of 10 of above exercises patient was able to do three-quarter place and hold with digits with wrist neutral. Patient was able to do a place and hold  an active motion to 2 cm foam block out of palm. Reinforced with patient the importance of doing these exercises.  Reviewed with patient blocking dorsal proximal phalanges to facilitate PIP extension with great success in between tendon glides.  Patient to focus on maintaining wrist stabilization in neutral while making a fist-able to do this in clinic today after active assisted range of motion PROM  Opposition to third digit picking up 2 cm foam block opposition limited by thumb palmar abduction as well as digit flexion and fifth opposition.  Patient can also do some yoga positions that involve shoulder but with elbow in extension Reviewed with patient again need min to moderate verbal cueing for reviewing of home exercises and progress  Patient to avoid propping up on right elbow or flexing more than 90 degrees. Avoid propping up on the right volar wrist Continue with elbow pad for cushioning over cubital tunnel.  As well as at nighttime in the volar elbow to prevent flexion more than 90 degrees. Patient report elbow pad help extremity for the tenderness over the elbow.  Review ulnar nerve glide in front of patient 5 reps 3 times a day pain-free Can also do ulnar nerve into shoulder abduction position 5 reps  Discussed with patient again nerve anatomy as well as healing    PATIENT EDUCATION: Education details: Progressing changes in  HEP  Person educated: Patient Education method: Explanation, Demonstration, Tactile cues, Verbal cues, and Handouts Education comprehension: verbalized understanding, returned demonstration, verbal cues required, and needs further education    GOALS: Goals reviewed with patient? Yes  LONG TERM GOALS: Target date: 12 wks   Patient to be independent in home program to modify right upper extremity in ADLs and IADLs as well as positioning during the day and night to decrease irritation on cubital tunnel and volar wrist with decreased pain Baseline:  Pain over the cubital tunnel and Guyon's canal 6-7/10.  With increased tenderness and pain increased numbness over her ulnar forearm wrist and hand into fifth and ulnar side of fourth digit Goal status: INITIAL  2.  Patient to be independent in home program to increase proprioception and ease  of right shoulder, elbow range of motion to normal reaching pattern  Baseline: Patient showed decreased coordination and proprioception in the reaching with the right upper extremity compensating with trunk and scapula Goal status: INITIAL  3.  Right wrist and forearm strength increased to 4+ to initiate strengthening and carrying groceries less than 4 pounds within no increase symptoms Baseline: Active range of motion within functional limits for supination pronation  but decreased wrist extension/ flexion decreased strength in ulnar deviation Goal status: Initial  4.  Digit active range of motion increased for patient to make composite fist and open hand to grasp around toothbrush and release, put hand in pocket  Baseline: Three-quarter of a fist.  Decreased PIP extension with a lumbrical fist.  Decreased intrinsic first and second digit unable to touch any fingertips with opposition.  Numbness on ulnar side of forearm into the wrist hand fifth digit and ulnar fourth Goal status: INITIAL  5.  Patient to be independent home program to decrease pain and tenderness from elbow to hand to less than 2/10 Baseline: Pain 6-7/10 at cubital tunnel and volar wrist and Ganz canal Goal status: INITIAL  6.  Thumb active range of motion improved to within functional limits to be able to do palmar abduction to grasp around 4 cm object as well as increased opposition to pick up medication Baseline: Patient lacking 1 to 2 cm for opposition to second digit.  Unable to do opposition to any digits.  Decreased palmar abduction.  Radial abduction 45 degrees Goal status: INITIAL    ASSESSMENT:  CLINICAL  IMPRESSION: Patient patient followed by OT for increased weakness, increased pain and numbness in right dominant upper extremity since after surgery.  Patient had on 10/08/2023 a R VATS nerve sheath tumor excision -and hx of nephrectomy .  Patient present with increased pain and tenderness over right cubital tunnel as well as Guyon canal and carpal tunnel.  Patient with increased numbness from elbow on ulnar side of forearm into the hand and fifth digit and ulnar side of fourth digit. 4.31 able to feel Charolett Copes from axilla to elbow-  Patient with decreased coordination and proprioception and range of motion of right shoulder and elbow in ADLs and IADLs.  Patient with decreased range of motion and strength in her right wrist ,digits and thumb.  Most weakness stiffness in thumb palmar abduction, digit flexion extension as well as interosseous.  Patient was educated in anatomy of nerve as well as positioning of right upper extremity to decrease compression and irritation at the elbow and the wrist every session. NOW .  Pt show increase ROM and strength in R shoulder and elbow - as well as increase strength and stability at forearm and wrist with supination pronation 2 lbs cuff weight  and radial ulnar deviation and wrist extention horizontal without gravity for 1 pound cuff weight.  Done in clinic today 2 pound cuff weight with only  -pt able to maintain wrist neutral while place and hold into fist -reinforced with patient to do digit flexion of motion in place and hold- increase extention of digits -was able to at cuff 2 pound weight for shoulder flexion and abduction on wall- needed min to mod assist for home exercises patient limited in functional use of right upper extremity in ADLs and IADLs.  Patient can benefit from skilled OT services to decrease pain and numbness increase motion and strength to return to prior level of function.  PERFORMANCE DEFICITS: in functional skills including ADLs, IADLs,  coordination, dexterity, proprioception, sensation, edema, ROM, strength, pain, flexibility, Fine motor control, Gross motor control, decreased knowledge of precautions, and decreased knowledge of use of DME,  IMPAIRMENTS: are limiting patient from ADLs, IADLs, rest and sleep, work, play, leisure, and social participation.   COMORBIDITIES: may have co-morbidities  that affects occupational performance. Patient will benefit from skilled OT to address above impairments and improve overall function.  MODIFICATION OR ASSISTANCE TO COMPLETE EVALUATION: Min-Moderate modification of tasks or assist with assess necessary to complete an evaluation.  OT OCCUPATIONAL PROFILE AND HISTORY: Detailed assessment: Review of records and additional review of physical, cognitive, psychosocial history related to current functional  performance.  CLINICAL DECISION MAKING: Moderate - several treatment options, min-mod task modification necessary  REHAB POTENTIAL: Good for goals  EVALUATION COMPLEXITY: Moderate      PLAN:  OT FREQUENCY: 1-2x/week  OT DURATION: 12 weeks  PLANNED INTERVENTIONS: 97168 OT Re-evaluation, 97535 self care/ADL training, 13086 therapeutic exercise, 97530 therapeutic activity, 97112 neuromuscular re-education, 97140 manual therapy, 97039 fluidotherapy, 97034 contrast bath, 97033 iontophoresis, passive range of motion, patient/family education, and DME and/or AE instructions    CONSULTED AND AGREED WITH PLAN OF CARE: Patient   Heloise Lobo, OTR/L,CLT 11/05/2023, 2:48 PM

## 2023-11-07 ENCOUNTER — Ambulatory Visit: Admitting: Occupational Therapy

## 2023-11-07 DIAGNOSIS — M6281 Muscle weakness (generalized): Secondary | ICD-10-CM | POA: Diagnosis not present

## 2023-11-07 DIAGNOSIS — M79601 Pain in right arm: Secondary | ICD-10-CM

## 2023-11-07 DIAGNOSIS — M25641 Stiffness of right hand, not elsewhere classified: Secondary | ICD-10-CM | POA: Diagnosis not present

## 2023-11-07 DIAGNOSIS — M25631 Stiffness of right wrist, not elsewhere classified: Secondary | ICD-10-CM

## 2023-11-07 NOTE — Therapy (Signed)
 OUTPATIENT OCCUPATIONAL THERAPY ORTHO TREATMENT  Patient Name: Daniel Lawrence MRN: [id number] DOB:1961-01-10, 63 y.o., male Today's Date: 11/07/2023  PCP: Dr Madelon Scheuermann REFERRING PROVIDER:Dr Vickey Grammes  END OF SESSION:  OT End of Session - 11/07/23 1808     Visit Number 7    Number of Visits 16    Date for OT Re-Evaluation 01/07/24    OT Start Time 1445    OT Stop Time 1532    OT Time Calculation (min) 47 min    Activity Tolerance Patient tolerated treatment well    Behavior During Therapy [facility] for tasks assessed/performed             Past Medical History:  Diagnosis Date   Allergy    seasonal   Arthritis    left knee   Cancer (HCC)    skin cancer   Cataract    Hyperlipidemia    Hypertension    Past Surgical History:  Procedure Laterality Date   BRAIN SURGERY  1971   COLONOSCOPY  05/03/2021   ELEVATION OF DEPRESSED SKULL FRACTURE  07/16/1969   traumatic blow to head by golf club   , [facility])   MOHS SURGERY  09/14/2011   left temple, squamous   ROBOT ASSISTED LAPAROSCOPIC NEPHRECTOMY Right 04/20/2016   Procedure: XI ROBOTIC ASSISTED LAPAROSCOPIC RADICAL NEPHRECTOMY;  Surgeon: Osborn Blaze, MD;  Location: [facility];  Service: Urology;  Laterality: Right;   Patient Active Problem List   Diagnosis Date Noted   Tubular adenoma of colon 05/29/2023   Hearing loss due to cerumen impaction, right 05/29/2023   AC (acromioclavicular) arthritis 02/27/2023   Increased prostate specific antigen (PSA) velocity 07/03/2022   Lipoma of right shoulder 05/21/2022   Low back pain 09/15/2021   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 07/22/2021   Aortic atherosclerosis (HCC) 04/18/2021   Melanoma in situ of right upper arm (HCC) 04/18/2021   H/O right nephrectomy 04/17/2020   Degenerative arthritis of left knee 02/25/2018   Chronic pain of left knee 02/08/2018   CKD (chronic kidney disease) stage 3, GFR 30-59 ml/min (HCC) 02/08/2018   Coronary  atherosclerosis due to lipid rich plaque 05/26/2016   History of renal cell carcinoma 04/20/2016   B12 deficiency 04/08/2016   Encounter for preventive health examination 06/24/2014   Hypertension 06/23/2014   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  Mostly at At medial elbow and wrist and hand. PRECAUTIONS: Patient to follow surgeon's precautions    WEIGHT BEARING RESTRICTIONS: no  PAIN:  Are you having pain?6-7/10 nerve pain in R UE  FALLS: Has patient fallen in last 6 months? No  LIVING ENVIRONMENT: Lives with: lives with their spouse  PLOF: Patient prior to surgery had normal active range of motion and  strength.  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  Working and teaching yoga and doing things around the house  NEXT MD VISIT: 17 April 25  OBJECTIVE:  Note: Objective measures were completed at Evaluation unless otherwise noted.  HAND DOMINANCE: Right  ADLs: Patient mostly limited by wrist and hand weakness.  Unable to grip objects and pick it up with weight as well as fine motor unable to pick up small  objects.    UPPER EXTREMITY ROM at eval     Patient with decreased proprioception and coordination in right upper extremity range of motion.  Slow and deliberate. Pain and tenderness over right cubital tunnel Increased pain and tenderness over Guyon's canal As well as tenderness over carpal tunnel at volar wrist Bruising present over dorsal wrist. Patient reports he had some IVs in dorsal R wrist  And had a lot of swelling in R hand and wrist postop composite nerve glide and shoulder abduction had increased pain at stage 4 of 5.  Pain from elbow into wrist and hand.    Active ROM Right eval Left eval  Shoulder flexion    Shoulder abduction    Shoulder adduction    Shoulder extension    Shoulder internal rotation    Shoulder external rotation    Elbow flexion    Elbow extension    Wrist flexion 84   Wrist extension 64   Wrist ulnar deviation 20   Wrist radial deviation 25   Wrist pronation 90   Wrist supination 90      At eval  Extension or tapping of digits on right hand of table needs passive range of motion and placed on hold for second digit.  3rd through 5th 3 -/5 strength Lumbrical fist decreased PIP extension Intrinsic a fist within functional limits 3rd through 5th decrease in second digit Decrease strength for dorsal and volar interossei Composite fist three-quarter range Opening of digits limited at PIP extension after composite fist.  Active ROM Right eval Left eval R 10/31/23 R 11/05/23  Thumb MCP (0-60) 35     Thumb IP (0-80) 10     Thumb Radial abd/add (0-55) WFL      Thumb Palmar abd/add (0-45) impaired      Thumb Opposition to Small Finger Unable to do any opposition even to second digit   Opposed to middle phalanges of second digit Pick up 2 cm foam block to 3rd   Index MCP (0-90)    75 80  Index PIP (0-100)    35 35  Index DIP (0-70)        Long MCP (0-90)     75 80  Long PIP (0-100)     60 65  Long DIP (0-70)        Ring MCP (0-90)     80 85   Ring PIP (0-100)     75 85  Ring DIP (0-70)        Little MCP (0-90)     80 90  Little PIP (0-100)     75 90  Little DIP (0-70)        (Blank rows = not tested)  HAND FUNCTION: Not tested evaluation  11/05/23 Grip strength: Right: 0 lbs; Left: 76 lbs, Lateral pinch: Right: 0 lbs, Left:   lbs, and 3 point pinch:  Right: 0 lbs, Left:   lbs  COORDINATION: Impaired unable to make composite fist unable to do 2. Or 3 point pinch  SENSATION: Numbness in ulnar forearm into hand and fifth digit and ulnar side of fourth Report some pins-and-needles in DIP of third Patient with increased pain and tenderness over cubital tunnel as well as Guyon's canal.  Tenderness over volar wrist carpal tunnel  EDEMA: Patient report had severe edema postop in right hand and wrist.  Continue to have some bruising over her dorsal wrist  COGNITION: Overall cognitive status: WNL      TREATMENT DATE: 11/07/23     I had actually a good night last night.  It was after I work my wrist and especially my fingers and hand.  It hurts when I done it but I woke up at midnight and did not had my normal pain in my wrist and forearm and hand. But today if I do that ulnar nerve glide it really hurts into my pinky.                                                                                                               Assessment of ulnar nerve glide in front of patient -this date patient with increased nerve tightness and symptoms.   Done moist heat to wrist and hand and forearm prior to ulnar nerve glide  In session done ulnar nerve glide -breaking it up in proximal and then distal doing digit extension and wrist extension separate prior to  composite 5 reps each and can assist endrange if no increase in sensory symptoms Tolerated better after the heat. Also at some light digit wrist extension on the wall with ball.  But keep it symptom-free.  Done also supination pronation and radial ulnar deviation with elbow  flexed with 30 cm therapy ball but then also with 1 kg weighted ball. Patient tolerated well.  15 reps each  Continue to have increased stabilization at wrist with supination/pronation -2 pound cuff weight with elbow unsupported  Continue with 2 pound cuff weight upgrade for horizontal focusing on ulnar deviation. Was able in the clinic to do cuff weight 2 pounds for wrist extension horizontal focusing on dropping wrist in neutral.  Patient to continue with green Thera-Band with the modification of wrap around the wrist pulling through the fingers patient to do scapular retraction as well as LAT pull downs 2 sets of 12 External rotation 2 sets of 12 Continue 2 pound weight on hand with shoulder flexion and abduction with ball on the wall 1 sets of 12.  Patient tolerating well. Add to home exercises. Patient continues to have some postop swelling in the right axilla and thoracic    Patient to continue contrast performance of 2 minutes heat and warm and ice. 3 rotations.   2-3 times a day to right elbow and hand or wrist.  Prior to home exercises  Reviewed with patient again for passive range of motion composite flexion of digits  Followed by placing hold. Patient to  focus on stabilization of the wrist in neutral with wrist flexion. This date patient was able to do 4 reps and then 3 sets of placing hold around 3 cm foam roll with keeping wrist at neutral during elbow flexion.  Reinforced with patient the importance of doing these digit flexion exercises but also has to do ulnar nerve in between  Reviewed with patient blocking dorsal proximal phalanges to facilitate PIP extension with great success in between tendon glides.  Patient to focus on maintaining wrist stabilization in neutral while making a fist-able to do this in clinic today after active assisted range of motion PROM   Patient to avoid propping up on right elbow or flexing more than 90 degrees. Avoid propping up on the right  volar wrist Continue with elbow pad for cushioning over cubital tunnel.  As well as at nighttime in the volar elbow to prevent flexion more than 90 degrees. Patient report elbow pad help extremity for the tenderness over the elbow.  Review ulnar nerve glide in front of patient 5 reps 3 times a day pain-free Can also do ulnar nerve into shoulder abduction position 5 reps  Discussed with patient again nerve anatomy as well as healing    PATIENT EDUCATION: Education details: Progressing changes in  HEP  Person educated: Patient Education method: Explanation, Demonstration, Tactile cues, Verbal cues, and Handouts Education comprehension: verbalized understanding, returned demonstration, verbal cues required, and needs further education    GOALS: Goals reviewed with patient? Yes  LONG TERM GOALS: Target date: 12 wks   Patient to be independent in home program to modify right upper extremity in ADLs and IADLs as well as positioning during the day and night to decrease irritation on cubital tunnel and volar wrist with decreased pain Baseline: Pain over the cubital tunnel and Guyon's canal 6-7/10.  With increased tenderness and pain increased numbness over her ulnar forearm wrist and hand into fifth and ulnar side of fourth digit Goal status: INITIAL  2.  Patient to be independent in home program to increase proprioception and ease  of right shoulder, elbow range of motion to normal reaching pattern  Baseline: Patient showed decreased coordination and proprioception in the reaching with the right upper extremity compensating with trunk and scapula Goal status: INITIAL  3.  Right wrist and forearm strength increased to 4+ to initiate strengthening and carrying groceries less than 4 pounds within no increase symptoms Baseline: Active range of motion within functional limits for supination pronation  but decreased wrist extension/ flexion decreased strength in ulnar deviation Goal status:  Initial  4.  Digit active range of motion increased for patient to make composite fist and open hand to grasp around toothbrush and release, put hand in pocket Baseline: Three-quarter of a fist.  Decreased PIP extension with a lumbrical fist.  Decreased intrinsic first and second digit unable to touch any fingertips with opposition.  Numbness on ulnar side of forearm into the wrist hand fifth digit and ulnar fourth Goal status: INITIAL  5.  Patient to be independent home program to decrease pain and tenderness from elbow to hand to less than 2/10 Baseline: Pain 6-7/10 at cubital tunnel and volar wrist and Ganz canal Goal status: INITIAL  6.  Thumb active range of motion improved to within functional limits to be able to do palmar abduction to grasp around 4 cm object as well as increased opposition to pick up medication Baseline: Patient lacking 1 to 2 cm for opposition to second digit.  Unable to do opposition to any digits.  Decreased palmar abduction.  Radial abduction 45 degrees Goal status: INITIAL    ASSESSMENT:  CLINICAL IMPRESSION: Patient patient followed by OT for increased weakness, increased pain and numbness in right dominant upper extremity since after surgery.  Patient had on 10/08/2023 a R VATS nerve sheath tumor excision -and hx of nephrectomy .  Patient present with increased pain and tenderness over right cubital tunnel as well as Guyon canal and carpal tunnel.  Patient with increased numbness from elbow on ulnar side of forearm into the hand and fifth digit and ulnar side of fourth digit. 4.31 able to feel Charolett Copes from axilla to elbow-  Patient with decreased coordination and proprioception and range of motion of right shoulder and elbow in ADLs and IADLs.  Patient with decreased range of motion and strength in her right wrist ,digits and thumb.  Most weakness stiffness in thumb palmar abduction, digit flexion extension as well as interosseous.  Patient was educated in  anatomy of nerve as well as positioning of right upper extremity to decrease compression and irritation at the elbow and the wrist every session. NOW .  Pt show increase ROM and strength in R shoulder and elbow - as well as increase strength and stability at forearm and wrist with supination pronation 2 lbs cuff weight  and radial ulnar deviation and wrist extention horizontal without gravity for 1 pound cuff weight.  Patient arrived this date with increased nerve tension in the ulnar nerve.  Reinforced with patient while now focusing on digit flexion with placing hold with the wrist in neutral has to balance it out also with ulnar nerve glide to maintain that nerve tension and glide in both direction distally because of ability to not use the distal part of the hand.-Continue with cuff 2 pound weight for shoulder flexion and abduction on wall- needed min to mod assist for home exercises patient limited in functional use of right upper extremity in ADLs and IADLs.  Patient can benefit from skilled OT services to decrease pain and numbness increase motion and strength to return to prior level of function.  PERFORMANCE DEFICITS: in functional skills including ADLs, IADLs, coordination, dexterity, proprioception, sensation, edema, ROM, strength, pain, flexibility, Fine motor control, Gross motor control, decreased knowledge of precautions, and decreased knowledge of use of DME,  IMPAIRMENTS: are limiting patient from ADLs, IADLs, rest and sleep, work, play, leisure, and social participation.   COMORBIDITIES: may have co-morbidities  that affects occupational performance. Patient will benefit from skilled OT to address above impairments and improve overall function.  MODIFICATION OR ASSISTANCE TO COMPLETE EVALUATION: Min-Moderate modification of tasks or assist with assess necessary to complete an evaluation.  OT OCCUPATIONAL PROFILE AND HISTORY: Detailed assessment: Review of records and additional review of  physical, cognitive, psychosocial history related to current functional performance.  CLINICAL DECISION MAKING: Moderate - several treatment options, min-mod task modification necessary  REHAB POTENTIAL: Good for goals  EVALUATION COMPLEXITY: Moderate      PLAN:  OT FREQUENCY: 1-2x/week  OT DURATION: 12 weeks  PLANNED INTERVENTIONS: 97168 OT Re-evaluation, 97535 self care/ADL training, 24401 therapeutic exercise, 97530 therapeutic activity, 97112 neuromuscular re-education, 97140 manual therapy, 97039 fluidotherapy, 97034 contrast bath, 97033 iontophoresis, passive range of motion, patient/family education, and DME and/or AE instructions    CONSULTED AND AGREED WITH PLAN OF CARE: Patient   Heloise Lobo, OTR/L,CLT 11/07/2023, 6:09 PM

## 2023-11-09 NOTE — Progress Notes (Unsigned)
 Cardiology Clinic Note   Date: 11/11/2023 ID: EGAN HENDLER, DOB [birth year], MRN [id number]  Primary Cardiologist:  Constancia Delton, MD  Chief Complaint   Daniel Lawrence is a 63 y.o. male who presents to the clinic today for routine follow up.   Patient Profile   Daniel Lawrence is followed by Dr. Junnie Olives for the history outlined below.      Past medical history significant for: Nonobstructive CAD. Echo 07/23/2022: EF 60 to 65%.  No RWMA.  Mild LVH.  Normal diastolic parameters.  Normal RV function, mild RVH.  Trivial MR. Coronary CTA 08/03/2022: Coronary calcium  score 320 (80th percentile).  Minimal LAD and RCA stenosis. Hypertension. Hyperlipidemia. Lipid panel 05/27/2023: LDL 108, HDL 51, TG 218, total 193. CKD stage III. S/p right nephrectomy in the setting of renal cell carcinoma.  In summary, patient was first evaluated by Dr. Junnie Olives on 07/18/2022 for chest pain that had started 4 to 6 weeks prior but had been ongoing x 3 days.  He felt symptoms could be related to reflux.  He cut back on alcohol and caffeine and symptoms improved.  He reported family history of father with stent placement in his 66s.  He underwent echo and coronary CTA for further evaluation as detailed above.  He was started on aspirin and rosuvastatin .  Patient was last seen in the office by Dr. Junnie Olives on 09/20/2022 for follow-up after testing.  He was doing well at that time with no recurrence of chest pain.  BP was well-controlled and no medication changes were made.     History of Present Illness    Today, patient is doing well from a cardiac standpoint. Patient denies shortness of breath, dyspnea on exertion, lower extremity edema, orthopnea or PND. No chest pain, pressure, or tightness. No palpitations.  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 He is planning on joining [employer] with a friend and will attend twice a  week to get more dedicated cardio in. 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 He is recovering slowly but still does not have use of his right hand. He is working with PT/OT and it is felt that he will get the use of his hand back.     ROS: All other systems reviewed and are otherwise negative except as noted in History of Present Illness.  EKGs/Labs Reviewed    EKG Interpretation Date/Time:  Monday November 11 2023 15:10:30 EDT Ventricular Rate:  83 PR Interval:  152 QRS Duration:  94 QT Interval:  342 QTC Calculation: 401 R Axis:   16  Text Interpretation: Normal sinus rhythm Normal ECG When compared with ECG of 10-Apr-2016 10:49, No significant change was found Confirmed by Morey Ar [phone]) on 11/11/2023 3:21:31 PM   05/27/2023: ALT 23; AST 23; BUN 15; Creatinine, Ser 1.35; Potassium 4.0; Sodium 138   12/26/2022: Hemoglobin 16.3; WBC 4.1   12/26/2022: TSH 2.00    Physical Exam    VS:  BP 108/78 (BP Location: Left Arm, Patient Position: Sitting, Cuff Size: Normal)   Pulse 83   Ht 5\' 9"  (1.753 m)   Wt 194 lb (88 kg)   SpO2 96%   BMI 28.65 kg/m  , BMI Body mass index is 28.65 kg/m.  GEN: Well nourished, well developed, in no acute distress. Neck: No JVD or carotid bruits. Cardiac:  RRR. No murmurs. No  rubs or gallops.   Respiratory:  Respirations regular and unlabored. Clear to auscultation without rales, wheezing or rhonchi. GI: Soft, nontender, nondistended. Extremities: Radials/DP/PT 2+ and equal bilaterally. No clubbing or cyanosis. No edema.   Skin: Warm and dry, no rash. Neuro: Strength intact.  Assessment & Plan   Nonobstructive CAD Echo demonstrated normal LV function, mild LVH/RVH, normal diastolic parameters, trivial MR.  Coronary CTA with calcium  score of 320, minimal LAD and RCA stenoses.  Patient denies chest pain, pressure or tightness. 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 He is planning on joining [employer] to do cardio a couple of times a week.  - Continue aspirin and rosuvastatin .  Hypertension BP today 108/78. No headaches or dizziness reported.  - Continue losartan .  Hyperlipidemia LDL 108, TG 218 November 2024, not at goal. - Continue rosuvastatin .  Disposition: Return in 1 year or sooner as needed.          Signed, Daniel Lawrence. Daniel Binford, DNP, NP-C

## 2023-11-11 ENCOUNTER — Encounter: Payer: Self-pay | Admitting: Student

## 2023-11-11 ENCOUNTER — Other Ambulatory Visit: Payer: Self-pay | Admitting: Family Medicine

## 2023-11-11 ENCOUNTER — Ambulatory Visit: Attending: Student | Admitting: Student

## 2023-11-11 VITALS — BP 108/78 | HR 83 | Ht 69.0 in | Wt 194.0 lb

## 2023-11-11 DIAGNOSIS — I2510 Atherosclerotic heart disease of native coronary artery without angina pectoris: Secondary | ICD-10-CM | POA: Diagnosis not present

## 2023-11-11 DIAGNOSIS — I10 Essential (primary) hypertension: Secondary | ICD-10-CM

## 2023-11-11 DIAGNOSIS — E785 Hyperlipidemia, unspecified: Secondary | ICD-10-CM

## 2023-11-11 NOTE — Patient Instructions (Signed)
 Medication Instructions:  No changes at this time.   *If you need a refill on your cardiac medications before your next appointment, please call your pharmacy*  Lab Work: None  If you have labs (blood work) drawn today and your tests are completely normal, you will receive your results only by: MyChart Message (if you have MyChart) OR A paper copy in the mail If you have any lab test that is abnormal or we need to change your treatment, we will call you to review the results.  Testing/Procedures: None  Follow-Up: At [facility], you and your health needs are our priority.  As part of our continuing mission to provide you with exceptional heart care, our providers are all part of one team.  This team includes your primary Cardiologist (physician) and Advanced Practice Providers or APPs (Physician Assistants and Nurse Practitioners) who all work together to provide you with the care you need, when you need it.  Your next appointment:   1 year(s)  Provider:   Constancia Delton, MD or Morey Ar, NP

## 2023-11-14 ENCOUNTER — Ambulatory Visit: Admitting: Occupational Therapy

## 2023-11-22 ENCOUNTER — Ambulatory Visit: Attending: Thoracic Surgery (Cardiothoracic Vascular Surgery) | Admitting: Occupational Therapy

## 2023-11-22 DIAGNOSIS — M25641 Stiffness of right hand, not elsewhere classified: Secondary | ICD-10-CM

## 2023-11-22 DIAGNOSIS — M25631 Stiffness of right wrist, not elsewhere classified: Secondary | ICD-10-CM

## 2023-11-22 DIAGNOSIS — M79601 Pain in right arm: Secondary | ICD-10-CM

## 2023-11-22 DIAGNOSIS — M6281 Muscle weakness (generalized): Secondary | ICD-10-CM | POA: Diagnosis not present
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
## 2023-11-25 ENCOUNTER — Ambulatory Visit: Admitting: Occupational Therapy

## 2023-11-25 DIAGNOSIS — M25631 Stiffness of right wrist, not elsewhere classified: Secondary | ICD-10-CM

## 2023-11-25 DIAGNOSIS — M79601 Pain in right arm: Secondary | ICD-10-CM | POA: Diagnosis not present

## 2023-11-25 DIAGNOSIS — M25641 Stiffness of right hand, not elsewhere classified: Secondary | ICD-10-CM

## 2023-11-25 DIAGNOSIS — M6281 Muscle weakness (generalized): Secondary | ICD-10-CM | POA: Diagnosis not present
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
## 2023-11-26 ENCOUNTER — Ambulatory Visit: Payer: BC Managed Care – PPO | Admitting: Internal Medicine

## 2023-11-28 ENCOUNTER — Ambulatory Visit: Admitting: Occupational Therapy

## 2023-11-28 NOTE — Progress Notes (Deleted)
  [facility] [address] [place] [address] Phone: [phone] Subjective:    I'm seeing this patient by the request  of:  Thersia Flax, MD  CC:   [id number]  Daniel Lawrence is a 63 y.o. male coming in with complaint of back and neck pain. December 2024. Also f/u for L knee pain. Patient states   Medications patient has been prescribed: Gabapentin   Taking:         Reviewed prior external information including notes and imaging from previsou exam, outside providers and external EMR if available.   As well as notes that were available from care everywhere and other healthcare systems.  Past medical history, social, surgical and family history all reviewed in electronic medical record.  No pertanent information unless stated regarding to the chief complaint.   Past Medical History:  Diagnosis Date   Allergy    seasonal   Arthritis    left knee   Cancer (HCC)    skin cancer   Cataract    Hyperlipidemia    Hypertension     Allergies  Allergen Reactions   Sulfa Antibiotics Rash     Review of Systems:  No headache, visual changes, nausea, vomiting, diarrhea, constipation, dizziness, abdominal pain, skin rash, fevers, chills, night sweats, weight loss, swollen lymph nodes, body aches, joint swelling, chest pain, shortness of breath, mood changes. POSITIVE muscle aches  Objective  There were no vitals taken for this visit.   General: No apparent distress alert and oriented x3 mood and affect normal, dressed appropriately.  HEENT: Pupils equal, extraocular movements intact  Respiratory: Patient's speak in full sentences and does not appear short of breath  Cardiovascular: No lower extremity edema, non tender, no erythema  Gait MSK:  Back   Osteopathic findings  C2 flexed rotated and side bent right C6 flexed rotated and side bent left T3 extended rotated and side bent right inhaled rib T9 extended rotated and side  bent left L2 flexed rotated and side bent right Sacrum right on right       Assessment and Plan:  No problem-specific Assessment & Plan notes found for this encounter.    Nonallopathic problems  Decision today to treat with OMT was based on Physical Exam  After verbal consent patient was treated with HVLA, ME, FPR techniques in cervical, rib, thoracic, lumbar, and sacral  areas  Patient tolerated the procedure well with improvement in symptoms  Patient given exercises, stretches and lifestyle modifications  See medications in patient instructions if given  Patient will follow up in 4-8 weeks             Note: This dictation was prepared with Dragon dictation along with smaller phrase technology. Any transcriptional errors that result from this process are unintentional.

## 2023-12-02 ENCOUNTER — Ambulatory Visit: Admitting: Family Medicine

## 2023-12-02 ENCOUNTER — Encounter: Admitting: Occupational Therapy

## 2023-12-03 ENCOUNTER — Ambulatory Visit: Admitting: Occupational Therapy

## 2023-12-03 DIAGNOSIS — M79601 Pain in right arm: Secondary | ICD-10-CM | POA: Diagnosis not present

## 2023-12-03 DIAGNOSIS — G540 Brachial plexus disorders: Secondary | ICD-10-CM | POA: Diagnosis not present

## 2023-12-03 DIAGNOSIS — M25631 Stiffness of right wrist, not elsewhere classified: Secondary | ICD-10-CM | POA: Diagnosis not present

## 2023-12-03 DIAGNOSIS — M6281 Muscle weakness (generalized): Secondary | ICD-10-CM

## 2023-12-03 DIAGNOSIS — M25641 Stiffness of right hand, not elsewhere classified: Secondary | ICD-10-CM

## 2023-12-03 NOTE — Therapy (Signed)
 OUTPATIENT OCCUPATIONAL THERAPY ORTHO TREATMENT  Patient Name: Daniel Lawrence MRN: [id number] DOB:[birth year], 63 y.o., male Today's Date: 12/03/2023  PCP: Dr Madelon Scheuermann REFERRING PROVIDER:Dr Vickey Grammes  END OF SESSION:  OT End of Session - 12/03/23 0818     Visit Number 10    Number of Visits 16    Date for OT Re-Evaluation 01/07/24    OT Start Time 0818    OT Stop Time 0905    OT Time Calculation (min) 47 min    Activity Tolerance Patient tolerated treatment well    Behavior During Therapy [facility] for tasks assessed/performed             Past Medical History:  Diagnosis Date   Allergy    seasonal   Arthritis    left knee   Cancer (HCC)    skin cancer   Cataract    Hyperlipidemia    Hypertension    Past Surgical History:  Procedure Laterality Date   BRAIN SURGERY  1971   COLONOSCOPY  05/03/2021   ELEVATION OF DEPRESSED SKULL FRACTURE  07/16/1969   traumatic blow to head by golf club   , [facility])   MOHS SURGERY  09/14/2011   left temple, squamous   ROBOT ASSISTED LAPAROSCOPIC NEPHRECTOMY Right 04/20/2016   Procedure: XI ROBOTIC ASSISTED LAPAROSCOPIC RADICAL NEPHRECTOMY;  Surgeon: Osborn Blaze, MD;  Location: [facility];  Service: Urology;  Laterality: Right;   Patient Active Problem List   Diagnosis Date Noted   Tubular adenoma of colon 05/29/2023   Hearing loss due to cerumen impaction, right 05/29/2023   AC (acromioclavicular) arthritis 02/27/2023   Increased prostate specific antigen (PSA) velocity 07/03/2022   Lipoma of right shoulder 05/21/2022   Low back pain 09/15/2021   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 07/22/2021   Aortic atherosclerosis (HCC) 04/18/2021   Melanoma in situ of right upper arm (HCC) 04/18/2021   H/O right nephrectomy 04/17/2020   Degenerative arthritis of left knee 02/25/2018   Chronic pain of left knee 02/08/2018   CKD (chronic kidney disease) stage 3, GFR 30-59 ml/min (HCC) 02/08/2018   Coronary  atherosclerosis due to lipid rich plaque 05/26/2016   History of renal cell carcinoma 04/20/2016   B12 deficiency 04/08/2016   Encounter for preventive health examination 06/24/2014   Hypertension 06/23/2014   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 Welby Hale this afternoon -we are hoping that he can do his that nerve conduction test.  I did sleep like you told me.  Done okay.  My wife is helping with the massage and I do the hot and cold.  Because my hand do want to swell a little bit.  But I do feel I have a little more of a grip able to use my thumb a little bit more Pt accompanied by: self  PERTINENT HISTORY: Patient had on 10/08/2023 by Dr. 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  Mostly at At medial elbow and wrist and hand. PRECAUTIONS: Patient to follow surgeon's precautions    WEIGHT BEARING RESTRICTIONS: no  PAIN:  Are you having pain?6-7/10 nerve pain in R UE  FALLS: Has patient fallen in last 6 months? No  LIVING ENVIRONMENT: Lives with: lives with their spouse  PLOF: Patient prior to surgery had normal active range of motion and strength.  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  Working and teaching  yoga and doing things around the house  NEXT MD VISIT: 17 April 25  OBJECTIVE:  Note: Objective measures were completed at Evaluation unless otherwise noted.  HAND DOMINANCE: Right  ADLs: Patient mostly limited by wrist and hand weakness.  Unable to grip objects and pick it up with weight as well as fine motor unable to pick up small objects.    UPPER EXTREMITY ROM at eval     Patient with decreased proprioception and coordination in right upper extremity range of motion.  Slow and deliberate. Pain and tenderness over right cubital tunnel Increased pain and tenderness over Guyon's canal As well as tenderness over carpal tunnel at volar wrist Bruising present over dorsal wrist. Patient reports he had some IVs in dorsal R wrist  And had a lot of swelling in R hand and wrist postop composite nerve glide and shoulder abduction had increased pain at stage 4 of 5.  Pain from elbow into wrist and hand.    Active ROM Right eval Left eval  Shoulder flexion    Shoulder abduction    Shoulder adduction    Shoulder extension    Shoulder internal rotation    Shoulder external rotation    Elbow flexion    Elbow extension    Wrist flexion 84   Wrist extension 64   Wrist ulnar deviation 20   Wrist radial deviation 25   Wrist pronation 90   Wrist supination 90      At eval  Extension or tapping of digits on right hand of table needs passive range of motion and placed on hold for second digit.  3rd through 5th 3 -/5 strength Lumbrical fist decreased PIP extension Intrinsic a fist within functional limits 3rd through 5th decrease in second digit Decrease strength for dorsal and volar interossei Composite fist three-quarter range Opening of digits limited at PIP extension after composite fist.  Active ROM Right eval Left eval R 10/31/23 R 11/05/23 R/12/03/23  Thumb MCP (0-60) 35      Thumb IP (0-80) 10      Thumb Radial abd/add (0-55) WFL     WFL  Thumb Palmar abd/add (0-45)  impaired     Place and hold 45  Thumb Opposition to Small Finger Unable to do any opposition even to second digit   Opposed to middle phalanges of second digit Pick up 2 cm foam block to 3rd    Index MCP (0-90)    75 80 80  Index PIP (0-100)    35 35 60  Index DIP (0-70)         Long MCP (0-90)     75 80 80  Long PIP (0-100)     60 65 65  Long DIP (0-70)  Ring MCP (0-90)     80 85 85  Ring PIP (0-100)     75 85 85  Ring DIP (0-70)         Little MCP (0-90)     80 90 90  Little PIP (0-100)     75 90 90  Little DIP (0-70)         (Blank rows = not tested)  HAND FUNCTION: Not tested evaluation  11/05/23 Grip strength: Right: 0 lbs; Left: 76 lbs, Lateral pinch: Right: 0 lbs, Left:   lbs, and 3 point pinch: Right: 0 lbs, Left:   lbs  COORDINATION: Impaired unable to make composite fist unable to do 2. Or 3 point pinch  SENSATION: Numbness in ulnar forearm into hand and fifth digit and ulnar side of fourth Report some pins-and-needles in DIP of third Patient with increased pain and tenderness over cubital tunnel as well as Guyon's canal.  Tenderness over volar wrist carpal tunnel  EDEMA: Patient report had severe edema postop in right hand and wrist.  Continue to have some bruising over her dorsal wrist  COGNITION: Overall cognitive status: WNL      TREATMENT DATE: 12/03/23       Focus this date on contrast 8 minutes to right wrist and hand to decrease edema and decrease stiffness increase motion.   Prior to some myofascial release as well as soft tissue massage and lymphatic drainage to right forearm wrist hand and digits.     Encourage patient for wife to assist and do the same at home after contrast. Passive range of motion and active assisted range of motion for tendon glides with focus on MC flexion with PIP extension Followed by passive range of motion to 2nd and 3rd digit DIP PIP and intrinsic. Followed by intrinsic active assisted range of motion all  digits Passive range of motion composite fist to palm.-Showed increased MCP flexion to about 7580 degrees.  As well as increased PIP flexion place and hold  Pt able to grasp tennis ball and lacrosse - pt to focus on thumb PA grasp -and bounce , catch with L  Able to graps around large cylinder today- Could grasp around blue flex bar after coban was wrap from traction - need V/c and min A to focus on PA of thumb grasping cylinder objects- was able to pick up and do elbow extention , flexion with wrist and forearm in neutral  Pt to use at home Followed by placing hold for fisting into yellow foam block and added to home program all of the above. 10 reps. Facilitate some opposition in place and hold 2 cm foam block to second digit facilitating palmar abduction. As well as opposition with 2nd and 3rd digit.  Limited by inability for thumb palmar abduction Done flex web - yellow use lat pinch facilitate tennis ball and 1 kg ball for L and R rolling and fw/bw as well as circle- unable to keep wrist and forearm neutral - doing mor sup and using palm in stead of lat pinch     Continue at home contrast alternating heat 2 minutes ice 1 minute x 2 and heat again for 2 minutes at home program to do 2 times a day  Patient to try Isotoner glove during the daytime and Tubigrip D at nighttime  Change to tendon glides see above for hand and fingers ( see today grasping act - add to HEP )  followed by placing whole palmar abduction And radial  abduction patient hold with rubber band  Continue with 3 times a day to do cuff 2 pound weight for ulnar deviation in horizontal plane; wrist extension added in a wrist horizontal plane 1 pound cuff weight; as well as supination pronation 12 reps  Recommend for patient to continue with D1 and D2 patterns for shoulder using but to switch to 2 pound cuff weight instead of the green Thera-Band.  8-10 reps Quality more than quantity Patient to pay attention to forearm Also  red band on the right with a rope and a tricep extension 12 reps Continue with green band for scapular retraction as well as lats pull downs 12 reps Can do 3 times a week at the gym                                                                                                               PATIENT EDUCATION: Education details: Progressing changes in  HEP  Person educated: Patient Education method: Explanation, Demonstration, Tactile cues, Verbal cues, and Handouts Education comprehension: verbalized understanding, returned demonstration, verbal cues required, and needs further education    GOALS: Goals reviewed with patient? Yes  LONG TERM GOALS: Target date: 12 wks   Patient to be independent in home program to modify right upper extremity in ADLs and IADLs as well as positioning during the day and night to decrease irritation on cubital tunnel and volar wrist with decreased pain Baseline: Pain over the cubital tunnel and Guyon's canal 6-7/10.  With increased tenderness and pain increased numbness over her ulnar forearm wrist and hand into fifth and ulnar side of fourth digit Goal status: INITIAL  2.  Patient to be independent in home program to increase proprioception and ease  of right shoulder, elbow range of motion to normal reaching pattern  Baseline: Patient showed decreased coordination and proprioception in the reaching with the right upper extremity compensating with trunk and scapula Goal status: INITIAL  3.  Right wrist and forearm strength increased to 4+ to initiate strengthening and carrying groceries less than 4 pounds within no increase symptoms Baseline: Active range of motion within functional limits for supination pronation  but decreased wrist extension/ flexion decreased strength in ulnar deviation Goal status: Initial  4.  Digit active range of motion increased for patient to make composite fist and open hand to grasp around toothbrush and release, put  hand in pocket Baseline: Three-quarter of a fist.  Decreased PIP extension with a lumbrical fist.  Decreased intrinsic first and second digit unable to touch any fingertips with opposition.  Numbness on ulnar side of forearm into the wrist hand fifth digit and ulnar fourth Goal status: INITIAL  5.  Patient to be independent home program to decrease pain and tenderness from elbow to hand to less than 2/10 Baseline: Pain 6-7/10 at cubital tunnel and volar wrist and Ganz canal Goal status: INITIAL  6.  Thumb active range of motion improved to within functional limits to be able to do palmar abduction to grasp around 4 cm  object as well as increased opposition to pick up medication Baseline: Patient lacking 1 to 2 cm for opposition to second digit.  Unable to do opposition to any digits.  Decreased palmar abduction.  Radial abduction 45 degrees Goal status: INITIAL    ASSESSMENT:  CLINICAL IMPRESSION: Patient patient followed by OT for increased weakness, increased pain and numbness in right dominant upper extremity since after surgery.  Patient had on 10/08/2023 a R VATS nerve sheath tumor excision -and hx of nephrectomy .  Patient present with increased pain and tenderness over right cubital tunnel as well as Guyon canal and carpal tunnel.  Patient with increased numbness from elbow on ulnar side of forearm into the hand and fifth digit and ulnar side of fourth digit. 4.31 able to feel Charolett Copes from axilla to elbow-  Patient with decreased coordination and proprioception and range of motion of right shoulder and elbow in ADLs and IADLs.  Patient with decreased range of motion and strength in her right wrist ,digits and thumb.  Most weakness stiffness in thumb palmar abduction, digit flexion extension as well as interosseous.  Patient was educated in anatomy of nerve as well as positioning of right upper extremity to decrease compression and irritation at the elbow and the wrist every session.  NOW  Patient with increased range of motion and strength in right shoulder as well as elbow.  Increased supination pronation strength as well as ulnar deviation and active range of motion wrist extension flexion.  Patient able to stabilize wrist more neutral during attempts for gripping.  Patient still tender over the cubital tunnel with positive Tinel over pisiform a and distal to pisiform pain.  Continue to be tender with a positive Tinel over the carpal tunnel.  Patient continues to have edema over the volar wrist and pain with wrist extention -  Patient report improvement in hypersensitivity and sensation in upper arm and proximal forearm.  Nerve pain mostly on the ulnar distal forearm and wrist and hand.  Upgraded all patient's home exercises this date.  Patient do 2 pound cuff weight for shoulder.  Instead of green band.  And change home program for digits to active assisted range of motion passive range of motion tendon glides.  As well as grasping cylinder objects using PA of thumb - pt has appt with hand MD today - Patient to continue with sleeping position educated on last visit.  needed min to mod assist for home exercises patient limited in functional use of right upper extremity in ADLs and IADLs.  Patient can benefit from skilled OT services to decrease pain and numbness increase motion and strength to return to prior level of function.  PERFORMANCE DEFICITS: in functional skills including ADLs, IADLs, coordination, dexterity, proprioception, sensation, edema, ROM, strength, pain, flexibility, Fine motor control, Gross motor control, decreased knowledge of precautions, and decreased knowledge of use of DME,  IMPAIRMENTS: are limiting patient from ADLs, IADLs, rest and sleep, work, play, leisure, and social participation.   COMORBIDITIES: may have co-morbidities  that affects occupational performance. Patient will benefit from skilled OT to address above impairments and improve overall  function.  MODIFICATION OR ASSISTANCE TO COMPLETE EVALUATION: Min-Moderate modification of tasks or assist with assess necessary to complete an evaluation.  OT OCCUPATIONAL PROFILE AND HISTORY: Detailed assessment: Review of records and additional review of physical, cognitive, psychosocial history related to current functional performance.  CLINICAL DECISION MAKING: Moderate - several treatment options, min-mod task modification necessary  REHAB POTENTIAL: Good for  goals  EVALUATION COMPLEXITY: Moderate      PLAN:  OT FREQUENCY: 1-2x/week  OT DURATION: 12 weeks  PLANNED INTERVENTIONS: 97168 OT Re-evaluation, 97535 self care/ADL training, 40981 therapeutic exercise, 97530 therapeutic activity, 97112 neuromuscular re-education, 97140 manual therapy, 97039 fluidotherapy, 97034 contrast bath, 97033 iontophoresis, passive range of motion, patient/family education, and DME and/or AE instructions    CONSULTED AND AGREED WITH PLAN OF CARE: Patient   Heloise Lobo, OTR/L,CLT 12/03/2023, 1:04 PM

## 2023-12-05 ENCOUNTER — Ambulatory Visit: Admitting: Occupational Therapy

## 2023-12-05 DIAGNOSIS — M25641 Stiffness of right hand, not elsewhere classified: Secondary | ICD-10-CM

## 2023-12-05 DIAGNOSIS — M79601 Pain in right arm: Secondary | ICD-10-CM | POA: Diagnosis not present

## 2023-12-05 DIAGNOSIS — M25631 Stiffness of right wrist, not elsewhere classified: Secondary | ICD-10-CM

## 2023-12-05 DIAGNOSIS — M6281 Muscle weakness (generalized): Secondary | ICD-10-CM | POA: Diagnosis not present

## 2023-12-05 NOTE — Therapy (Signed)
 OUTPATIENT OCCUPATIONAL THERAPY ORTHO TREATMENT  Patient Name: Daniel Lawrence MRN: [id number] DOB:01-16-1961, 63 y.o., male Today's Date: 12/05/2023  PCP: Dr Madelon Scheuermann REFERRING PROVIDER:Dr Vickey Grammes  END OF SESSION:  OT End of Session - 12/05/23 1825     Visit Number 11    Number of Visits 16    Date for OT Re-Evaluation 01/07/24    OT Start Time 0820    OT Stop Time 0910    OT Time Calculation (min) 50 min    Activity Tolerance Patient tolerated treatment well    Behavior During Therapy [facility] for tasks assessed/performed             Past Medical History:  Diagnosis Date   Allergy    seasonal   Arthritis    left knee   Cancer (HCC)    skin cancer   Cataract    Hyperlipidemia    Hypertension    Past Surgical History:  Procedure Laterality Date   BRAIN SURGERY  1971   COLONOSCOPY  05/03/2021   ELEVATION OF DEPRESSED SKULL FRACTURE  07/16/1969   traumatic blow to head by golf club   , [facility])   MOHS SURGERY  09/14/2011   left temple, squamous   ROBOT ASSISTED LAPAROSCOPIC NEPHRECTOMY Right 04/20/2016   Procedure: XI ROBOTIC ASSISTED LAPAROSCOPIC RADICAL NEPHRECTOMY;  Surgeon: Osborn Blaze, MD;  Location: [facility];  Service: Urology;  Laterality: Right;   Patient Active Problem List   Diagnosis Date Noted   Tubular adenoma of colon 05/29/2023   Hearing loss due to cerumen impaction, right 05/29/2023   AC (acromioclavicular) arthritis 02/27/2023   Increased prostate specific antigen (PSA) velocity 07/03/2022   Lipoma of right shoulder 05/21/2022   Low back pain 09/15/2021   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 07/22/2021   Aortic atherosclerosis (HCC) 04/18/2021   Melanoma in situ of right upper arm (HCC) 04/18/2021   H/O right nephrectomy 04/17/2020   Degenerative arthritis of left knee 02/25/2018   Chronic pain of left knee 02/08/2018   CKD (chronic kidney disease) stage 3, GFR 30-59 ml/min (HCC) 02/08/2018   Coronary  atherosclerosis due to lipid rich plaque 05/26/2016   History of renal cell carcinoma 04/20/2016   B12 deficiency 04/08/2016   Encounter for preventive health examination 06/24/2014   Hypertension 06/23/2014   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  He done like you the tapping of my arm as well as asking about the motor and sensory.  He referred me to Dr. Merlyn Starring that is a really good with nerves.  As well as my nerve conduction test is now in July but he told me to call and get on the cancellation list to move it up earlier. Pt accompanied by: self  PERTINENT HISTORY: Patient had on 10/08/2023 by Dr. 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  Mostly at At medial elbow and wrist and hand. PRECAUTIONS: Patient to follow surgeon's precautions    WEIGHT BEARING RESTRICTIONS: no  PAIN:  Are you having pain?6-7/10 nerve pain mostly in the right ulnar hand  FALLS: Has patient fallen in last 6 months? No  LIVING ENVIRONMENT: Lives with: lives with their spouse  PLOF: Patient prior to surgery had normal active range of motion and strength.  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  Working and teaching yoga  and doing things around the house  NEXT MD VISIT:?  OBJECTIVE:  Note: Objective measures were completed at Evaluation unless otherwise noted.  HAND DOMINANCE: Right  ADLs: Patient mostly limited by wrist and hand weakness.  Unable to grip objects and pick it up with weight as well as fine motor unable to pick up small objects.    UPPER EXTREMITY ROM at eval     Patient with decreased proprioception and coordination in right upper extremity range of motion.  Slow and deliberate. Pain and tenderness over right cubital tunnel Increased pain and tenderness over Guyon's canal As well as tenderness over carpal tunnel at volar wrist Bruising present over dorsal wrist. Patient reports he had some IVs in dorsal R wrist  And had a lot of swelling in R hand and wrist postop composite nerve glide and shoulder abduction had increased pain at stage 4 of 5.  Pain from elbow into wrist and hand.    Active ROM Right eval Left eval  Shoulder flexion    Shoulder abduction    Shoulder adduction    Shoulder extension    Shoulder internal rotation    Shoulder external rotation    Elbow flexion    Elbow extension    Wrist flexion 84   Wrist extension 64   Wrist ulnar deviation 20   Wrist radial deviation 25   Wrist pronation 90   Wrist supination 90      At eval  Extension or tapping of digits on right hand of table needs passive range of motion and placed on hold for second digit.  3rd through 5th 3 -/5 strength Lumbrical fist decreased PIP extension Intrinsic a fist within functional limits 3rd through 5th decrease in second digit Decrease strength for dorsal and volar interossei Composite fist three-quarter range Opening of digits limited at PIP extension after composite fist.  Active ROM Right eval Left eval R 10/31/23 R 11/05/23 R/12/03/23  Thumb MCP (0-60) 35      Thumb IP (0-80) 10      Thumb Radial abd/add (0-55) WFL     WFL  Thumb Palmar abd/add (0-45) impaired     Place  and hold 45  Thumb Opposition to Small Finger Unable to do any opposition even to second digit   Opposed to middle phalanges of second digit Pick up 2 cm foam block to 3rd    Index MCP (0-90)    75 80 80  Index PIP (0-100)    35 35 60  Index DIP (0-70)         Long MCP (0-90)     75 80 80  Long PIP (0-100)     60 65 65  Long DIP (0-70)         Ring MCP (0-90)  80 85 85  Ring PIP (0-100)     75 85 85  Ring DIP (0-70)         Little MCP (0-90)     80 90 90  Little PIP (0-100)     75 90 90  Little DIP (0-70)         (Blank rows = not tested)  HAND FUNCTION: Not tested evaluation  11/05/23 Grip strength: Right: 0 lbs; Left: 76 lbs, Lateral pinch: Right: 0 lbs, Left:   lbs, and 3 point pinch: Right: 0 lbs, Left:   lbs  COORDINATION: Impaired unable to make composite fist unable to do 2. Or 3 point pinch  SENSATION: Numbness in ulnar forearm into hand and fifth digit and ulnar side of fourth Report some pins-and-needles in DIP of third Patient with increased pain and tenderness over cubital tunnel as well as Guyon's canal.  Tenderness over volar wrist carpal tunnel  12/05/23: Charolett Copes was done.  Patient feel only deep pressure on ulnar side of distal upper arm elbow forearm and hand. Also not able to fill hot and cold.  EDEMA: Patient report had severe edema postop in right hand and wrist.  Continue to have some bruising over her dorsal wrist  COGNITION: Overall cognitive status: WNL      TREATMENT DATE: 12/05/23     Patient report back about his appointment with Dr. Bobby Burr Referred to Dr. Merlyn Starring that is really good with nerve injuries.  As well as nerve conduction test was moved up to July but he can call to get the cancellation list. Charolett Copes test was done by OT today patient can feel only deep pressure on the ulnar distal upper arm elbow forearm into the hand. Unable to feel hot and cold.    Done 6 minutes heat to right hand decrease stiffness increase  motion.    Patient continue with contrast as well as myofascial massage by wife. Passive range of motion and active assisted range of motion for tendon glides with focus on MC flexion with PIP extension Followed by passive range of motion to 2nd and 3rd digit DIP PIP and intrinsic. Followed by intrinsic active assisted range of motion all digits Passive range of motion composite fist to palm.-Showed increased MCP flexion to about 75-80 degrees.  As well as increased PIP flexion place and hold  Pt able to grasp tennis ball and lacrosse - pt to focus on thumb PA grasp -and bounce , catch with L  Able to graps around large cylinder today- Could grasp around blue flex bar after coban was wrap from traction - need V/c and min A to focus on PA of thumb grasping cylinder objects- was able to pick up and do elbow extention , flexion with wrist and forearm in neutral  Pt to use at home Patient was only able to grasp around red foam roller as well as highlighter but mostly with fourth digit Followed by placing hold for fisting into yellow foam block and added to home program all of the above. 10 reps. Facilitate some opposition picking up type measure with the DIP of second and thumb.  As well as third and thumb.   Moved down to 3 cm foam block and then 2 cm foam block focusing on tips of digits.   Attempted 2 pound clip for lateral pinch unable.   Was able to do blue foam folding double for visual clue for lateral pinch patient can do at home.  Continue with 3 times a day to do cuff 2 pound weight for ulnar deviation in horizontal plane; wrist extension added in a wrist horizontal plane 1 pound cuff weight; as well as supination pronation 12 reps  Recommend for patient to continue with D1 and D2 patterns for shoulder using but to switch to 2 pound cuff weight instead of the green Thera-Band.  8-10 reps Quality more than quantity Patient to pay attention to forearm Also red band on the right with  a rope and a tricep extension 12 reps Continue with green band for scapular retraction as well as lats pull downs 12 reps Can do 3 times a week at the gym                                                                                                               PATIENT EDUCATION: Education details: Progressing changes in  HEP  Person educated: Patient Education method: Explanation, Demonstration, Tactile cues, Verbal cues, and Handouts Education comprehension: verbalized understanding, returned demonstration, verbal cues required, and needs further education    GOALS: Goals reviewed with patient? Yes  LONG TERM GOALS: Target date: 12 wks   Patient to be independent in home program to modify right upper extremity in ADLs and IADLs as well as positioning during the day and night to decrease irritation on cubital tunnel and volar wrist with decreased pain Baseline: Pain over the cubital tunnel and Guyon's canal 6-7/10.  With increased tenderness and pain increased numbness over her ulnar forearm wrist and hand into fifth and ulnar side of fourth digit Goal status: INITIAL  2.  Patient to be independent in home program to increase proprioception and ease  of right shoulder, elbow range of motion to normal reaching pattern  Baseline: Patient showed decreased coordination and proprioception in the reaching with the right upper extremity compensating with trunk and scapula Goal status: INITIAL  3.  Right wrist and forearm strength increased to 4+ to initiate strengthening and carrying groceries less than 4 pounds within no increase symptoms Baseline: Active range of motion within functional limits for supination pronation  but decreased wrist extension/ flexion decreased strength in ulnar deviation Goal status: Initial  4.  Digit active range of motion increased for patient to make composite fist and open hand to grasp around toothbrush and release, put hand in pocket Baseline:  Three-quarter of a fist.  Decreased PIP extension with a lumbrical fist.  Decreased intrinsic first and second digit unable to touch any fingertips with opposition.  Numbness on ulnar side of forearm into the wrist hand fifth digit and ulnar fourth Goal status: INITIAL  5.  Patient to be independent home program to decrease pain and tenderness from elbow to hand to less than 2/10 Baseline: Pain 6-7/10 at cubital tunnel and volar wrist and Ganz canal Goal status: INITIAL  6.  Thumb active range of motion improved to within functional limits to be able to do palmar abduction to grasp around 4 cm object as well as increased opposition to  pick up medication Baseline: Patient lacking 1 to 2 cm for opposition to second digit.  Unable to do opposition to any digits.  Decreased palmar abduction.  Radial abduction 45 degrees Goal status: INITIAL    ASSESSMENT:  CLINICAL IMPRESSION: Patient patient followed by OT for increased weakness, increased pain and numbness in right dominant upper extremity since after surgery.  Patient had on 10/08/2023 a R VATS nerve sheath tumor excision -and hx of nephrectomy .  Patient present with increased pain and tenderness over right cubital tunnel as well as Guyon canal and carpal tunnel.  Patient with increased numbness from elbow on ulnar side of forearm into the hand and fifth digit and ulnar side of fourth digit. 4.31 able to feel Charolett Copes from axilla to elbow-but only deep pressure down to hand on the ulnar side.  Patient with decreased coordination and proprioception and range of motion of right shoulder and elbow in ADLs and IADLs.  Patient with decreased range of motion and strength in her right wrist ,digits and thumb.  Most weakness stiffness in thumb palmar abduction, digit flexion extension as well as interosseous.  Patient was educated in anatomy of nerve as well as positioning of right upper extremity to decrease compression and irritation at the elbow  and the wrist every session. NOW  Patient with increased range of motion and strength in right shoulder as well as elbow.  Increased supination pronation strength as well as ulnar deviation and active range of motion wrist extension flexion.  Patient able to stabilize wrist more neutral during attempts for gripping.  Patient still tender over the cubital tunnel with positive Tinel over pisiform a and distal to pisiform pain.  Continue to be tender with a positive Tinel over the carpal tunnel.  Patient continues to have edema over the volar wrist and pain with wrist extention -  Patient report improvement in hypersensitivity and sensation in upper arm and proximal forearm.  Nerve pain mostly on the ulnar distal forearm and wrist and hand.  Upgraded all patient's home exercises this week -change home program for digits to active assisted range of motion passive range of motion tendon glides.  As well as grasping cylinder objects using PA of thumb -smaller cylinder objects with a placing hold as well as opposition with thumb to 2nd and 3rd focusing on DIP when picking up.  As well as the lateral pinch with a visual cue doing blue foam block patient to continue with sleeping position educated on last visit.  needed min to mod assist for home exercises patient limited in functional use of right upper extremity in ADLs and IADLs.  Patient can benefit from skilled OT services to decrease pain and numbness increase motion and strength to return to prior level of function.  PERFORMANCE DEFICITS: in functional skills including ADLs, IADLs, coordination, dexterity, proprioception, sensation, edema, ROM, strength, pain, flexibility, Fine motor control, Gross motor control, decreased knowledge of precautions, and decreased knowledge of use of DME,  IMPAIRMENTS: are limiting patient from ADLs, IADLs, rest and sleep, work, play, leisure, and social participation.   COMORBIDITIES: may have co-morbidities  that affects  occupational performance. Patient will benefit from skilled OT to address above impairments and improve overall function.  MODIFICATION OR ASSISTANCE TO COMPLETE EVALUATION: Min-Moderate modification of tasks or assist with assess necessary to complete an evaluation.  OT OCCUPATIONAL PROFILE AND HISTORY: Detailed assessment: Review of records and additional review of physical, cognitive, psychosocial history related to current functional performance.  CLINICAL  DECISION MAKING: Moderate - several treatment options, min-mod task modification necessary  REHAB POTENTIAL: Good for goals  EVALUATION COMPLEXITY: Moderate      PLAN:  OT FREQUENCY: 1-2x/week  OT DURATION: 12 weeks  PLANNED INTERVENTIONS: 97168 OT Re-evaluation, 97535 self care/ADL training, 19147 therapeutic exercise, 97530 therapeutic activity, 97112 neuromuscular re-education, 97140 manual therapy, 97039 fluidotherapy, 97034 contrast bath, 97033 iontophoresis, passive range of motion, patient/family education, and DME and/or AE instructions    CONSULTED AND AGREED WITH PLAN OF CARE: Patient   Heloise Lobo, OTR/L,CLT 12/05/2023, 6:26 PM

## 2023-12-12 ENCOUNTER — Ambulatory Visit: Admitting: Occupational Therapy

## 2023-12-12 DIAGNOSIS — M25631 Stiffness of right wrist, not elsewhere classified: Secondary | ICD-10-CM | POA: Diagnosis not present

## 2023-12-12 DIAGNOSIS — M25641 Stiffness of right hand, not elsewhere classified: Secondary | ICD-10-CM

## 2023-12-12 DIAGNOSIS — M6281 Muscle weakness (generalized): Secondary | ICD-10-CM | POA: Diagnosis not present

## 2023-12-12 DIAGNOSIS — M79601 Pain in right arm: Secondary | ICD-10-CM | POA: Diagnosis not present
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
## 2023-12-17 ENCOUNTER — Ambulatory Visit: Attending: Thoracic Surgery (Cardiothoracic Vascular Surgery) | Admitting: Occupational Therapy

## 2023-12-17 DIAGNOSIS — M25641 Stiffness of right hand, not elsewhere classified: Secondary | ICD-10-CM | POA: Diagnosis not present

## 2023-12-17 DIAGNOSIS — M25631 Stiffness of right wrist, not elsewhere classified: Secondary | ICD-10-CM | POA: Diagnosis not present

## 2023-12-17 DIAGNOSIS — M79601 Pain in right arm: Secondary | ICD-10-CM | POA: Diagnosis not present

## 2023-12-17 DIAGNOSIS — M6281 Muscle weakness (generalized): Secondary | ICD-10-CM | POA: Diagnosis not present
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
## 2023-12-20 ENCOUNTER — Ambulatory Visit: Admitting: Occupational Therapy

## 2023-12-20 DIAGNOSIS — M25631 Stiffness of right wrist, not elsewhere classified: Secondary | ICD-10-CM | POA: Diagnosis not present

## 2023-12-20 DIAGNOSIS — M6281 Muscle weakness (generalized): Secondary | ICD-10-CM

## 2023-12-20 DIAGNOSIS — M79601 Pain in right arm: Secondary | ICD-10-CM

## 2023-12-20 DIAGNOSIS — M25641 Stiffness of right hand, not elsewhere classified: Secondary | ICD-10-CM | POA: Diagnosis not present

## 2023-12-20 NOTE — Therapy (Signed)
 OUTPATIENT OCCUPATIONAL THERAPY ORTHO TREATMENT  Patient Name: Daniel Lawrence MRN: [id number] DOB:[birth year], 63 y.o., male Today's Date: 12/20/2023  PCP: Dr Madelon Scheuermann REFERRING PROVIDER:Dr Vickey Grammes  END OF SESSION:  OT End of Session - 12/20/23 1314     Visit Number 14    Number of Visits 16    Date for OT Re-Evaluation 01/07/24    OT Start Time 1122    OT Stop Time 1214    OT Time Calculation (min) 52 min    Activity Tolerance Patient tolerated treatment well    Behavior During Therapy [facility] for tasks assessed/performed             Past Medical History:  Diagnosis Date   Allergy    seasonal   Arthritis    left knee   Cancer (HCC)    skin cancer   Cataract    Hyperlipidemia    Hypertension    Past Surgical History:  Procedure Laterality Date   BRAIN SURGERY  1971   COLONOSCOPY  05/03/2021   ELEVATION OF DEPRESSED SKULL FRACTURE  07/16/1969   traumatic blow to head by golf club   , [facility])   MOHS SURGERY  09/14/2011   left temple, squamous   ROBOT ASSISTED LAPAROSCOPIC NEPHRECTOMY Right 04/20/2016   Procedure: XI ROBOTIC ASSISTED LAPAROSCOPIC RADICAL NEPHRECTOMY;  Surgeon: Osborn Blaze, MD;  Location: [facility];  Service: Urology;  Laterality: Right;   Patient Active Problem List   Diagnosis Date Noted   Tubular adenoma of colon 05/29/2023   Hearing loss due to cerumen impaction, right 05/29/2023   AC (acromioclavicular) arthritis 02/27/2023   Increased prostate specific antigen (PSA) velocity 07/03/2022   Lipoma of right shoulder 05/21/2022   Low back pain 09/15/2021   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 07/22/2021   Aortic atherosclerosis (HCC) 04/18/2021   Melanoma in situ of right upper arm (HCC) 04/18/2021   H/O right nephrectomy 04/17/2020   Degenerative arthritis of left knee 02/25/2018   Chronic pain of left knee 02/08/2018   CKD (chronic kidney disease) stage 3, GFR 30-59 ml/min (HCC) 02/08/2018   Coronary  atherosclerosis due to lipid rich plaque 05/26/2016   History of renal cell carcinoma 04/20/2016   B12 deficiency 04/08/2016   Encounter for preventive health examination 06/24/2014   Hypertension 06/23/2014   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  Curious to see what it says.  The swelling in my hand is definitely improving in my fingers even.  Since we have done that glove.  When I do my ulnar nerve glide I feel this pulling my forearm.  I can definitely tell the nerve pain is my pinky ring finger in that side of my hand. Pt accompanied by: self  PERTINENT HISTORY: Patient had on 10/08/2023 by Dr. 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  Mostly  at At medial elbow and wrist and hand. PRECAUTIONS: Patient to follow surgeon's precautions    WEIGHT BEARING RESTRICTIONS: no  PAIN:  Are you having pain?5 /10 nerve pain mostly in the right ulnar hand  FALLS: Has patient fallen in last 6 months? No  LIVING ENVIRONMENT: Lives with: lives with their spouse  PLOF: Patient prior to surgery had normal active range of motion and strength.  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  Working and teaching yoga and doing things around  the house  NEXT MD VISIT:?  OBJECTIVE:  Note: Objective measures were completed at Evaluation unless otherwise noted.  HAND DOMINANCE: Right  ADLs: Patient mostly limited by wrist and hand weakness.  Unable to grip objects and pick it up with weight as well as fine motor unable to pick up small objects.    UPPER EXTREMITY ROM at eval     Patient with decreased proprioception and coordination in right upper extremity range of motion.  Slow and deliberate. Pain and tenderness over right cubital tunnel Increased pain and tenderness over Guyon's canal As well as tenderness over carpal tunnel at volar wrist Bruising present over dorsal wrist. Patient reports he had some IVs in dorsal R wrist  And had a lot of swelling in R hand and wrist postop composite nerve glide and shoulder abduction had increased pain at stage 4 of 5.  Pain from elbow into wrist and hand.    Active ROM Right eval Left eval  Shoulder flexion    Shoulder abduction    Shoulder adduction    Shoulder extension    Shoulder internal rotation    Shoulder external rotation    Elbow flexion    Elbow extension    Wrist flexion 84   Wrist extension 64   Wrist ulnar deviation 20   Wrist radial deviation 25   Wrist pronation 90   Wrist supination 90      At eval  Extension or tapping of digits on right hand of table needs passive range of motion and placed on hold for second digit.  3rd through 5th 3 -/5 strength Lumbrical fist decreased PIP extension Intrinsic a fist within functional limits 3rd through 5th decrease in second digit Decrease strength for dorsal and volar interossei Composite fist three-quarter range Opening of digits limited at PIP extension after composite fist.  Active ROM Right eval Left eval R 10/31/23 R 11/05/23 R/12/03/23 R 12/17/23 Place and hold   Thumb MCP (0-60) 35       Thumb IP (0-80) 10       Thumb Radial abd/add (0-55) WFL     WFL   Thumb Palmar abd/add (0-45) impaired      Place and hold 45   Thumb Opposition to Small Finger Unable to do any opposition even to second digit   Opposed to middle phalanges of second digit Pick up 2 cm foam block to 3rd     Index MCP (0-90)    75 80 80 80  Index PIP (0-100)    35 35 60 70  Index DIP (0-70)          Long MCP (0-90)     75 80 80 80  Long PIP (0-100)     60 65 65 70  Long DIP (0-70)  Ring MCP (0-90)     80 85 85 85  Ring PIP (0-100)     75 85 85 85  Ring DIP (0-70)          Little MCP (0-90)     80 90 90 90  Little PIP (0-100)     75 90 90 90  Little DIP (0-70)          (Blank rows = not tested)  HAND FUNCTION: Not tested evaluation  11/05/23 Grip strength: Right: 0 lbs; Left: 76 lbs, Lateral pinch: Right: 0 lbs, Left:   lbs, and 3 point pinch: Right: 0 lbs, Left:   lbs  COORDINATION: Impaired unable to make composite fist unable to do 2. Or 3 point pinch  SENSATION: Numbness in ulnar forearm into hand and fifth digit and ulnar side of fourth Report some pins-and-needles in DIP of third Patient with increased pain and tenderness over cubital tunnel as well as Guyon's canal.  Tenderness over volar wrist carpal tunnel  12/05/23: Charolett Copes was done.  Patient feel only deep pressure on ulnar side of distal upper arm elbow forearm and hand. Also not able to fill hot and cold. 12/20/23 Semmes-Weinstein this date on ulnar upper arm to mid forearm 4.56  consistent and delayed but able to identify 4.31   EDEMA: Patient report had severe edema postop in right hand and wrist.  Continue to have some bruising over her dorsal wrist  COGNITION: Overall cognitive status: WNL      TREATMENT DATE: 12/20/23    Patient in EMG appointment is next Wednesday Patient report and show decreased edema in the fingers, hand and wrist after wearing Isotoner glove that was fitted last week.  Patient decreased tenderness and positive Tinel over carpal tunnel.  Less discomfort and pain with wrist flexion extension of the  carpal tunnel.  Patient with increased tenderness over flexors with ulnar nerve. Able to change in at patient's home program using yellow flex bar for supination and pronation tolerating well 12 reps can do 2 sets Yellow Thera-Band for ulnar deviation as well as wrist extension 2 sets of 12 tolerating well.  Contrast done 8 minutes alternating hot and cold to decrease edema and increase motion prior to soft tissue and range of motion to right hand and wrist facilitating lymphatic flow as well as myofascial massage and lymphatic massage decongesting right hand digits and wrist Afterward Was able to tolerate Graston tool #2 for sweeping and brushing over volar and dorsal forearm As well as tool #6 for sweeping and brushing over palm and volar digits prior to range of motion.  Focusing on digit extension Followed by placing hold MC flexion with PIP extension great improvement Followed by composite flexion of right hand digits With placing hold with great improvement in ease.     Patient continue with contrast as well as myofascial massage by wife.    Patient continue with rubber band for place and hold for palmar abduction with rubber band at the base 12 reps 15 reps Continue her radial abduction with rubber band.  Recommend for patient to continue with D1 and D2 patterns for shoulder using but to switch to 2 pound cuff weight instead of the green Thera-Band.  8-10 reps Quality more than quantity Patient to pay attention to forearm  Plan through reviewing check on patient for shoulder and scapular strengthening exercises Last home exercises was red band on the right with a rope and a tricep extension 12 reps Continue  with green band for scapular retraction as well as lats pull downs 12 reps Can do 3 times a week at the gym                                                                                                               PATIENT EDUCATION: Education details:  Progressing changes in  HEP  Person educated: Patient Education method: Explanation, Demonstration, Tactile cues, Verbal cues, and Handouts Education comprehension: verbalized understanding, returned demonstration, verbal cues required, and needs further education    GOALS: Goals reviewed with patient? Yes  LONG TERM GOALS: Target date: 12 wks   Patient to be independent in home program to modify right upper extremity in ADLs and IADLs as well as positioning during the day and night to decrease irritation on cubital tunnel and volar wrist with decreased pain Baseline: Pain over the cubital tunnel and Guyon's canal 6-7/10.  With increased tenderness and pain increased numbness over her ulnar forearm wrist and hand into fifth and ulnar side of fourth digit Goal status: INITIAL  2.  Patient to be independent in home program to increase proprioception and ease  of right shoulder, elbow range of motion to normal reaching pattern  Baseline: Patient showed decreased coordination and proprioception in the reaching with the right upper extremity compensating with trunk and scapula Goal status: INITIAL  3.  Right wrist and forearm strength increased to 4+ to initiate strengthening and carrying groceries less than 4 pounds within no increase symptoms Baseline: Active range of motion within functional limits for supination pronation  but decreased wrist extension/ flexion decreased strength in ulnar deviation Goal status: Initial  4.  Digit active range of motion increased for patient to make composite fist and open hand to grasp around toothbrush and release, put hand in pocket Baseline: Three-quarter of a fist.  Decreased PIP extension with a lumbrical fist.  Decreased intrinsic first and second digit unable to touch any fingertips with opposition.  Numbness on ulnar side of forearm into the wrist hand fifth digit and ulnar fourth Goal status: INITIAL  5.  Patient to be independent home program to  decrease pain and tenderness from elbow to hand to less than 2/10 Baseline: Pain 6-7/10 at cubital tunnel and volar wrist and Ganz canal Goal status: INITIAL  6.  Thumb active range of motion improved to within functional limits to be able to do palmar abduction to grasp around 4 cm object as well as increased opposition to pick up medication Baseline: Patient lacking 1 to 2 cm for opposition to second digit.  Unable to do opposition to any digits.  Decreased palmar abduction.  Radial abduction 45 degrees Goal status: INITIAL    ASSESSMENT:  CLINICAL IMPRESSION: Patient patient followed by OT for increased weakness, increased pain and numbness in right dominant upper extremity since after surgery.  Patient had on 10/08/2023 a R VATS nerve sheath tumor excision -and hx of nephrectomy .  Patient present with increased pain and tenderness over right cubital tunnel as well  as Guyon canal and carpal tunnel.  Patient with increased numbness from elbow on ulnar side of forearm into the hand and fifth digit and ulnar side of fourth digit. 4.31 able to feel Charolett Copes from axilla to elbow-but only deep pressure down to hand on the ulnar side.  Patient with decreased coordination and proprioception and range of motion of right shoulder and elbow in ADLs and IADLs.  Patient with decreased range of motion and strength in her right wrist ,digits and thumb.  Most weakness stiffness in thumb palmar abduction, digit flexion extension as well as interosseous.  Patient was educated in anatomy of nerve as well as positioning of right upper extremity to decrease compression and irritation at the elbow and the wrist every session. NOW  Patient with increased range of motion and strength in right shoulder as well as elbow.  Plan to reassess home program and modify for shoulder and elbow.  Patient this date was able to chains supination pronation to flex bar and yellow Thera-Band for ulnar deviation and wrist extension.   Patient's edema in right wrist and hand and digits improving greatly since using Isotoner glove in combination with contrast.  Less tenderness over the carpal tunnel.  Able to tolerate Graston soft tissue with great success prior to digit flexion this date.  Semmes-Weinstein today improved on the ulnar nerve distribution forearm upper arm, elbow to mid forearm 4.56-4.31.  2 weeks ago with only deep pressure.  Patient to do composite wrist extension stretch prior to ulnar nerve glide to facilitate more for composite nerve glide.  At the wrist-  Patient to continue with sleeping position educated in the past-patient has an EMG next Wednesday.  Needed min to mod assist for home exercises patient limited in functional use of right upper extremity in ADLs and IADLs.  Patient can benefit from skilled OT services to decrease pain and numbness increase motion and strength to return to prior level of function.  PERFORMANCE DEFICITS: in functional skills including ADLs, IADLs, coordination, dexterity, proprioception, sensation, edema, ROM, strength, pain, flexibility, Fine motor control, Gross motor control, decreased knowledge of precautions, and decreased knowledge of use of DME,  IMPAIRMENTS: are limiting patient from ADLs, IADLs, rest and sleep, work, play, leisure, and social participation.   COMORBIDITIES: may have co-morbidities  that affects occupational performance. Patient will benefit from skilled OT to address above impairments and improve overall function.  MODIFICATION OR ASSISTANCE TO COMPLETE EVALUATION: Min-Moderate modification of tasks or assist with assess necessary to complete an evaluation.  OT OCCUPATIONAL PROFILE AND HISTORY: Detailed assessment: Review of records and additional review of physical, cognitive, psychosocial history related to current functional performance.  CLINICAL DECISION MAKING: Moderate - several treatment options, min-mod task modification necessary  REHAB POTENTIAL:  Good for goals  EVALUATION COMPLEXITY: Moderate      PLAN:  OT FREQUENCY: 1-2x/week  OT DURATION: 12 weeks  PLANNED INTERVENTIONS: 97168 OT Re-evaluation, 97535 self care/ADL training, 16109 therapeutic exercise, 97530 therapeutic activity, 97112 neuromuscular re-education, 97140 manual therapy, 97039 fluidotherapy, 97034 contrast bath, 97033 iontophoresis, passive range of motion, patient/family education, and DME and/or AE instructions    CONSULTED AND AGREED WITH PLAN OF CARE: Patient   Heloise Lobo, OTR/L,CLT 12/20/2023, 1:16 PM

## 2023-12-23 ENCOUNTER — Ambulatory Visit: Admitting: Occupational Therapy

## 2023-12-25 ENCOUNTER — Encounter: Payer: Self-pay | Admitting: Internal Medicine

## 2023-12-25 ENCOUNTER — Ambulatory Visit (INDEPENDENT_AMBULATORY_CARE_PROVIDER_SITE_OTHER): Admitting: Internal Medicine

## 2023-12-25 VITALS — BP 120/82 | HR 106 | Ht 69.0 in | Wt 196.4 lb

## 2023-12-25 DIAGNOSIS — I10 Essential (primary) hypertension: Secondary | ICD-10-CM | POA: Diagnosis not present

## 2023-12-25 DIAGNOSIS — R5381 Other malaise: Secondary | ICD-10-CM

## 2023-12-25 DIAGNOSIS — G540 Brachial plexus disorders: Secondary | ICD-10-CM

## 2023-12-25 DIAGNOSIS — C493 Malignant neoplasm of connective and soft tissue of thorax: Secondary | ICD-10-CM

## 2023-12-25 DIAGNOSIS — R5383 Other fatigue: Secondary | ICD-10-CM | POA: Diagnosis not present

## 2023-12-25 DIAGNOSIS — R7301 Impaired fasting glucose: Secondary | ICD-10-CM

## 2023-12-25 DIAGNOSIS — Z23 Encounter for immunization: Secondary | ICD-10-CM | POA: Diagnosis not present

## 2023-12-25 DIAGNOSIS — E785 Hyperlipidemia, unspecified: Secondary | ICD-10-CM | POA: Diagnosis not present

## 2023-12-25 MED ORDER — GABAPENTIN 300 MG PO CAPS: 300.0000 mg | ORAL_CAPSULE | Freq: Three times a day (TID) | ORAL | 3 refills | Status: AC

## 2023-12-25 MED ORDER — ROSUVASTATIN CALCIUM 20 MG PO TABS: 20.0000 mg | ORAL_TABLET | Freq: Every day | ORAL | 1 refills | Status: DC

## 2023-12-25 MED ORDER — LOSARTAN POTASSIUM 50 MG PO TABS: 50.0000 mg | ORAL_TABLET | Freq: Every day | ORAL | 1 refills | Status: DC

## 2023-12-25 NOTE — Progress Notes (Signed)
 Subjective:  Patient ID: Daniel Lawrence, male    DOB: 09-01-1960  Age: 63 y.o. MRN: [id number]  CC: The primary encounter diagnosis was Primary hypertension. 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   HPI Daniel Lawrence presents for  Chief Complaint  Patient presents with   Medical Management of Chronic Issues    6 month follow up     [name] recently underwent right VATS chest wall excision of tumor on March 25   His recovery was complicated by  brachio plexus neuropathy  and he has been gradually regaining strength in right arm since discharge.  .he was seen by a hand surgeon post operatively,  and reassured that the damage was reversible and has noted gradual return of strength to everything but the hand. He  continues to have numbness  pain and muscle atrophy in the hand and wrist.  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     aspirin EC 81 MG tablet Take 81 mg by mouth daily. Swallow whole.     Cholecalciferol (VITAMIN D3) 125 MCG (5000 UT) CAPS Take 1 capsule by mouth daily.     Coenzyme Q10 100 MG capsule Take 100 mg by mouth daily.     hydrocortisone 2.5 % ointment Apply topically 2 (two) times daily.     loratadine (CLARITIN) 10 MG tablet Take 10 mg by mouth daily as needed for allergies.      Multiple Vitamins-Minerals (CENTRUM  SILVER 50+MEN) TABS      Plant Sterol Stanol-Pantethine (CHOLESTOFF COMPLETE) 300-100 MG CAPS      tadalafil  (CIALIS ) 5 MG tablet Take 1 tablet (5 mg total) by mouth daily. 90 tablet 3   gabapentin  (NEURONTIN ) 300 MG capsule TAKE 1 CAPSULE BY MOUTH EVERYDAY AT BEDTIME (Patient taking differently: Take 300 mg by mouth. Pt takes medication three times daily) 90 capsule 0   losartan  (COZAAR ) 50 MG tablet TAKE 1 TABLET BY MOUTH EVERY DAY 90 tablet 1   rosuvastatin  (CRESTOR ) 20 MG tablet TAKE 1 TABLET BY MOUTH EVERY DAY 90 tablet 1   nystatin  (MYCOSTATIN /NYSTOP ) powder Apply 1 Application topically 2 (two) times daily. To rash until resolved. (Patient not taking: Reported on 12/25/2023) 15 g 0   oseltamivir  (TAMIFLU ) 75 MG capsule Take 1 capsule (75 mg total) by mouth every 12 (twelve) hours. (Patient not taking: Reported on 11/11/2023) 10 capsule 0   predniSONE  (DELTASONE ) 10 MG tablet 6 tablets daily for 3 days, then reduce by 1 tablet daily until gone (Patient not taking: Reported on 11/11/2023) 33 tablet 0   predniSONE  (DELTASONE ) 20 MG tablet Take 1 tablet (20 mg total) by mouth daily with breakfast. (Patient not taking: Reported on 12/25/2023) 7 tablet 0   No facility-administered medications prior to visit.    Review of Systems;  Patient denies headache, fevers, malaise, unintentional weight loss, skin rash, eye pain, sinus congestion and sinus pain, sore throat, dysphagia,  hemoptysis , cough, dyspnea, wheezing, chest pain, palpitations, orthopnea, edema, abdominal pain, nausea, melena, diarrhea, constipation, flank pain, dysuria, hematuria, urinary  Frequency, nocturia, numbness, tingling, seizures,  Focal weakness, Loss of consciousness,  Tremor, insomnia, depression, anxiety, and suicidal ideation.      Objective:  BP 120/82   Pulse (!) 106   Ht 5' 9 (1.753 m)   Wt 196 lb 6.4 oz (89.1 kg)   SpO2 97%   BMI 29.00 kg/m   BP Readings from Last 3 Encounters:  12/25/23 120/82  11/11/23  108/78  07/31/23 (!) 151/82    Wt Readings from Last 3 Encounters:  12/25/23 196 lb 6.4 oz (89.1 kg)  11/11/23 194 lb (88 kg)  07/03/23 195 lb (88.5 kg)    Physical Exam Vitals reviewed.  Constitutional:      General: He is not in acute distress.    Appearance: Normal appearance. He is normal weight. He is not ill-appearing, toxic-appearing or diaphoretic.  HENT:     Head: Normocephalic.   Eyes:     General: No scleral icterus.       Right eye: No discharge.        Left eye: No discharge.     Conjunctiva/sclera: Conjunctivae normal.    Cardiovascular:     Rate and Rhythm: Normal rate and regular rhythm.     Heart sounds: Normal heart sounds.  Pulmonary:     Effort: Pulmonary effort is normal. No respiratory distress.     Breath sounds: Normal breath sounds.   Musculoskeletal:        General: Normal range of motion.     Cervical back: Normal range of motion.   Skin:    General: Skin is warm and dry.   Neurological:     General: No focal deficit present.     Mental Status: He is alert and oriented to person, place, and time. Mental status is at baseline.     Cranial Nerves: Cranial nerves 2-12 are intact.     Sensory: Sensory deficit present.     Motor: Weakness present.     Coordination: Coordination is intact.     Comments: 5th finger right hand insensate  Thenar and wrist muscle atrophy  Psychiatric:        Mood and Affect: Mood normal.        Behavior: Behavior normal.        Thought Content: Thought content normal.        Judgment: Judgment normal.    Lab Results  Component Value Date   HGBA1C 5.3 04/15/2020   HGBA1C 5.2 04/05/2016    Lab Results  Component Value Date   CREATININE 1.35 05/27/2023   CREATININE 1.39 12/26/2022   CREATININE 1.3 11/19/2022    Lab Results  Component Value Date   WBC 4.1 12/26/2022   HGB 16.3 12/26/2022   HCT 49.4 12/26/2022   PLT 136.0 (L) 12/26/2022   GLUCOSE 89 05/27/2023   CHOL 193 05/27/2023   TRIG 218 (H)  05/27/2023   HDL 51 05/27/2023   LDLDIRECT 154.9 12/07/2011   LDLCALC 108 (H) 05/27/2023   ALT 23 05/27/2023   AST 23 05/27/2023   NA 138 05/27/2023   K 4.0 05/27/2023   CL 100 05/27/2023   CREATININE 1.35 05/27/2023   BUN 15 05/27/2023   CO2 30 05/27/2023   TSH 2.00 12/26/2022  PSA 0.54 05/29/2023   INR 1.0 06/23/2014   HGBA1C 5.3 04/15/2020   MICROALBUR <0.7 11/20/2022    No results found.  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  Symptoms are now limited to right hand      Physical deconditioning Assessment & Plan: Secondary to recent surgery .  He is interested in working with a trainer to regain strength .  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  Dispense: 90 tablet; Refill: 1 -     Rosuvastatin  Calcium ; Take 1 tablet (20 mg total) by mouth daily.  Dispense: 90 tablet; Refill: 1 -     Gabapentin ; Take 1 capsule (300 mg total) by mouth 3 (three) times daily.  Dispense: 90 capsule; Refill: 3     I spent 34 minutes on the day of this face to face encounter reviewing patient's  most recent visit with oncology, thoracic surgery,  and orthopedics ,  prior relevant surgical and non  surgical procedures, recent  labs and imaging studies, counseling on weight management,  reviewing the assessment and plan with patient, and post visit ordering and reviewing of  diagnostics and therapeutics with patient  .   Follow-up: Return in about 5 months (around 05/29/2024).   Thersia Flax, MD

## 2023-12-25 NOTE — Patient Instructions (Addendum)
 I recommend [employer]  in [place].  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

## 2023-12-26 ENCOUNTER — Ambulatory Visit: Admitting: Occupational Therapy

## 2023-12-26 DIAGNOSIS — R5381 Other malaise: Secondary | ICD-10-CM | POA: Insufficient documentation

## 2023-12-26 DIAGNOSIS — M25641 Stiffness of right hand, not elsewhere classified: Secondary | ICD-10-CM

## 2023-12-26 DIAGNOSIS — G540 Brachial plexus disorders: Secondary | ICD-10-CM | POA: Insufficient documentation

## 2023-12-26 DIAGNOSIS — M25631 Stiffness of right wrist, not elsewhere classified: Secondary | ICD-10-CM

## 2023-12-26 DIAGNOSIS — M6281 Muscle weakness (generalized): Secondary | ICD-10-CM | POA: Diagnosis not present

## 2023-12-26 DIAGNOSIS — M79601 Pain in right arm: Secondary | ICD-10-CM

## 2023-12-26 NOTE — Assessment & Plan Note (Signed)
 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  Symptoms are now limited to right hand

## 2023-12-26 NOTE — Assessment & Plan Note (Signed)
 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

## 2023-12-26 NOTE — Assessment & Plan Note (Signed)
 Secondary to recent surgery .  He is interested in working with a trainer to regain strength .  Recommending [facility] Nance
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
## 2023-12-31 ENCOUNTER — Ambulatory Visit: Admitting: Occupational Therapy

## 2023-12-31 DIAGNOSIS — M25641 Stiffness of right hand, not elsewhere classified: Secondary | ICD-10-CM

## 2023-12-31 DIAGNOSIS — M79601 Pain in right arm: Secondary | ICD-10-CM

## 2023-12-31 DIAGNOSIS — M25631 Stiffness of right wrist, not elsewhere classified: Secondary | ICD-10-CM

## 2023-12-31 DIAGNOSIS — M6281 Muscle weakness (generalized): Secondary | ICD-10-CM | POA: Diagnosis not present

## 2023-12-31 NOTE — Therapy (Signed)
 OUTPATIENT OCCUPATIONAL THERAPY ORTHO TREATMENT/RECERT  Patient Name: Daniel Lawrence MRN: [id number] DOB:11-30-1960, 63 y.o., male Today's Date: 12/31/2023  PCP: Dr Madelon Scheuermann REFERRING PROVIDER:Dr Vickey Grammes  END OF SESSION:  OT End of Session - 12/31/23 1615     Visit Number 16    Number of Visits 24    Date for OT Re-Evaluation 02/25/24    OT Start Time 1615    OT Stop Time 1714    OT Time Calculation (min) 59 min    Activity Tolerance Patient tolerated treatment well    Behavior During Therapy [facility] for tasks assessed/performed          Past Medical History:  Diagnosis Date   Allergy    seasonal   Arthritis    left knee   Cancer (HCC)    skin cancer   Cataract    Hyperlipidemia    Hypertension    Past Surgical History:  Procedure Laterality Date   BRAIN SURGERY  1971   COLONOSCOPY  05/03/2021   ELEVATION OF DEPRESSED SKULL FRACTURE  07/16/1969   traumatic blow to head by golf club   , [facility])   MOHS SURGERY  09/14/2011   left temple, squamous   ROBOT ASSISTED LAPAROSCOPIC NEPHRECTOMY Right 04/20/2016   Procedure: XI ROBOTIC ASSISTED LAPAROSCOPIC RADICAL NEPHRECTOMY;  Surgeon: Osborn Blaze, MD;  Location: [facility];  Service: Urology;  Laterality: Right;   Patient Active Problem List   Diagnosis Date Noted   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 12/26/2023   Physical deconditioning 12/26/2023   Tubular adenoma of colon 05/29/2023   Hearing loss due to cerumen impaction, right 05/29/2023   AC (acromioclavicular) arthritis 02/27/2023   Increased prostate specific antigen (PSA) velocity 07/03/2022   Lipoma of right shoulder 05/21/2022   Low back pain 09/15/2021   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   Aortic atherosclerosis (HCC) 04/18/2021   Melanoma in situ of right upper arm (HCC) 04/18/2021   H/O right nephrectomy 04/17/2020   Degenerative arthritis of left knee 02/25/2018   Chronic pain of left knee 02/08/2018   CKD (chronic kidney disease)  stage 3, GFR 30-59 ml/min (HCC) 02/08/2018   Coronary atherosclerosis due to lipid rich plaque 05/26/2016   History of renal cell carcinoma 04/20/2016   B12 deficiency 04/08/2016   Encounter for preventive health examination 06/24/2014   Hypertension 06/23/2014   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  And overall over fingers.  I think even my pinky.  I did not wear my glove yesterday so my hand is swollen.  I did try and wear it some today Pt accompanied by: self  PERTINENT HISTORY: Patient had on 10/08/2023 by Dr. 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  Mostly at At medial elbow and wrist and hand. PRECAUTIONS: Patient to follow surgeon's precautions    WEIGHT BEARING  RESTRICTIONS: no  PAIN:  Are you having pain?5 /10 nerve pain mostly in the right ulnar hand  FALLS: Has patient fallen in last 6 months? No  LIVING ENVIRONMENT: Lives with: lives with their spouse  PLOF: Patient prior to surgery had normal active range of motion and strength.  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  Working and teaching yoga and doing things around the house  NEXT MD VISIT:?  OBJECTIVE:  Note: Objective  measures were completed at Evaluation unless otherwise noted.  HAND DOMINANCE: Right  ADLs: Patient mostly limited by wrist and hand weakness.  Unable to grip objects and pick it up with weight as well as fine motor unable to pick up small objects.    UPPER EXTREMITY ROM at eval     Patient with decreased proprioception and coordination in right upper extremity range of motion.  Slow and deliberate. Pain and tenderness over right cubital tunnel Increased pain and tenderness over Guyon's canal As well as tenderness over carpal tunnel at volar wrist Bruising present over dorsal wrist. Patient reports he had some IVs in dorsal R wrist  And had a lot of swelling in R hand and wrist postop composite nerve glide and shoulder abduction had increased pain at stage 4 of 5.  Pain from elbow into wrist and hand.    Active ROM Right eval Left eval  Shoulder flexion    Shoulder abduction    Shoulder adduction    Shoulder extension    Shoulder internal rotation    Shoulder external rotation    Elbow flexion    Elbow extension    Wrist flexion 84   Wrist extension 64   Wrist ulnar deviation 20   Wrist radial deviation 25   Wrist pronation 90   Wrist supination 90      At eval  Extension or tapping of digits on right hand of table needs passive range of motion and placed on hold for second digit.  3rd through 5th 3 -/5 strength Lumbrical fist decreased PIP extension Intrinsic a fist within functional limits 3rd through 5th decrease in second digit Decrease strength for dorsal and volar interossei Composite fist three-quarter range Opening of digits limited at PIP extension after composite fist.  Active ROM Right eval Left eval R 10/31/23 R 11/05/23 R/12/03/23 R 12/17/23 Place and hold   Thumb MCP (0-60) 35       Thumb IP (0-80) 10       Thumb Radial abd/add (0-55) WFL     WFL   Thumb Palmar abd/add (0-45) impaired     Place and hold 45   Thumb Opposition to Small Finger Unable  to do any opposition even to second digit   Opposed to middle phalanges of second digit Pick up 2 cm foam block to 3rd     Index MCP (0-90)    75 80 80 80  Index PIP (0-100)    35 35 60 70  Index DIP (0-70)          Long MCP (0-90)     75 80 80 80  Long PIP (0-100)     60 65 65 70  Long DIP (0-70)          Ring MCP (0-90)     80 85 85 85  Ring PIP (0-100)  75 85 85 85  Ring DIP (0-70)          Little MCP (0-90)     80 90 90 90  Little PIP (0-100)     75 90 90 90  Little DIP (0-70)          (Blank rows = not tested)  HAND FUNCTION: Not tested evaluation  11/05/23 Grip strength: Right: 0 lbs; Left: 76 lbs, Lateral pinch: Right: 0 lbs, Left:   lbs, and 3 point pinch: Right: 0 lbs, Left:   lbs  COORDINATION: Impaired unable to make composite fist unable to do 2. Or 3 point pinch  SENSATION: Numbness in ulnar forearm into hand and fifth digit and ulnar side of fourth Report some pins-and-needles in DIP of third Patient with increased pain and tenderness over cubital tunnel as well as Guyon's canal.  Tenderness over volar wrist carpal tunnel  12/05/23: Charolett Copes was done.  Patient feel only deep pressure on ulnar side of distal upper arm elbow forearm and hand. Also not able to fill hot and cold. 12/20/23 Semmes-Weinstein this date on ulnar upper arm to mid forearm 4.56  consistent and delayed but able to identify 4.31   EDEMA: Patient report had severe edema postop in right hand and wrist.  Continue to have some bruising over her dorsal wrist  COGNITION: Overall cognitive status: WNL      TREATMENT DATE: 12/31/23    Patient had EMG last week at [facility] Patient arrive with increased sensation in all digits this date.  Positive Tinel distal of pisiform.  At distal palmar crease.  With extra tenderness over the fifth digit as well as ulnar side of fourth.   Able to feel all digits this date.   Patient with increased edema today in the hand and fingers.  Did not wear gloves.    Patient was able to do ulnar nerve glide with no increased symptoms was able to get wrist extension at 60 degrees on the left 65.   Ulnar nerve improve since last time.    Patient to continue with  table slides with light pressure 20 reps to home program to initiate working towards the end range for wrist extension without too much pressure over the carpal tunnel.  Tolerating well.  Ulnar nerve glide 5 reps with improvement in motion       Home program continue  yellow flex bar for supination and pronation tolerating well 12 reps can do 2 sets Yellow Thera-Band for ulnar deviation as well as wrist extension 2 sets of 12 tolerating well.  Continue at home and done today contrast 8 minutes alternating hot and cold to decrease edema and increase motion prior to soft tissue and range of motion to right hand and wrist facilitating lymphatic flow as well as myofascial massage and lymphatic massage decongesting right hand digits and wrist  Was able to tolerate Graston tool #2 for sweeping and brushing over volar and dorsal forearm last session As well as tool #6 for sweeping and brushing over palm and volar digits prior to range of motion.  Focusing on digit extension able to resist as well as tapping of the table with patient can do Followed by placing hold MC flexion with PIP extension great improvement Followed by composite flexion of right hand digits With placing hold with great improvement in ease. Followed by facilitation of thumb palmar abduction Facilitation of opposition after passive range of motion to thumb IP in composite flexion Facilitation of patient  placing hold 2 cm object between thumb and third digit as well as fourth.                                                                                                                  PATIENT EDUCATION: Education details: Progressing changes in  HEP  Person educated: Patient Education method: Explanation,  Demonstration, Tactile cues, Verbal cues, and Handouts Education comprehension: verbalized understanding, returned demonstration, verbal cues required, and needs further education    GOALS: Goals reviewed with patient? Yes  LONG TERM GOALS: Target date: 12 wks   Patient to be independent in home program to modify right upper extremity in ADLs and IADLs as well as positioning during the day and night to decrease irritation on cubital tunnel and volar wrist with decreased pain Baseline: Pain over the cubital tunnel and Guyon's canal 6-7/10.  With increased tenderness and pain increased numbness over her ulnar forearm wrist and hand into fifth and ulnar side of fourth digit Goal status: INITIAL  2.  Patient to be independent in home program to increase proprioception and ease  of right shoulder, elbow range of motion to normal reaching pattern  Baseline: Patient showed decreased coordination and proprioception in the reaching with the right upper extremity compensating with trunk and scapula Goal status: INITIAL  3.  Right wrist and forearm strength increased to 4+ to initiate strengthening and carrying groceries less than 4 pounds within no increase symptoms Baseline: Active range of motion within functional limits for supination pronation  but decreased wrist extension/ flexion decreased strength in ulnar deviation Goal status: Initial  4.  Digit active range of motion increased for patient to make composite fist and open hand to grasp around toothbrush and release, put hand in pocket Baseline: Three-quarter of a fist.  Decreased PIP extension with a lumbrical fist.  Decreased intrinsic first and second digit unable to touch any fingertips with opposition.  Numbness on ulnar side of forearm into the wrist hand fifth digit and ulnar fourth Goal status: INITIAL  5.  Patient to be independent home program to decrease pain and tenderness from elbow to hand to less than 2/10 Baseline: Pain 6-7/10  at cubital tunnel and volar wrist and Ganz canal Goal status: INITIAL  6.  Thumb active range of motion improved to within functional limits to be able to do palmar abduction to grasp around 4 cm object as well as increased opposition to pick up medication Baseline: Patient lacking 1 to 2 cm for opposition to second digit.  Unable to do opposition to any digits.  Decreased palmar abduction.  Radial abduction 45 degrees Goal status: INITIAL    ASSESSMENT:  CLINICAL IMPRESSION: Patient patient followed by OT for increased weakness, increased pain and numbness in right dominant upper extremity since after surgery.  Patient had on 10/08/2023 a R VATS nerve sheath tumor excision -and hx of nephrectomy .  Patient present with increased pain and tenderness over right cubital tunnel as well as Guyon canal and carpal tunnel  Patient with decreased coordination and proprioception and range of motion of right shoulder and elbow in ADLs and IADLs.  Patient with decreased range of motion and strength in her right wrist ,digits and thumb.  Most weakness stiffness in thumb palmar abduction, digit flexion extension as well as interosseous.  NOW  Patient with increased range of motion and strength in right shoulder , elbow and forearm - wrist is improving.  Patient doing since last week week supination pronation to flex bar and yellow Thera-Band for ulnar deviation and wrist extension.  Also table slides for wrist and digits extention followed by chest press RTB - upgrade to GTB for scapulla retraction and Lat pull downs- loop thru hand.  Patient's edema increased this date coming in.  Reports he did not wear his glove.  He usually patient show decrease edema in right wrist and hand and digits improving greatly since using Isotoner glove in combination with contrast.  Patient this date with increased sensation in the fifth digit.  Negative Tinel over the carpal tunnel.  As well as Tinel's at the ulnar nerve distal to  pisiform a at distal palmar crease.  Was able also to facilitate opposition of a place and hold of 2 cm object with thumb palmar abduction and third digit.  Patient to continue with sleeping position educated in the past-patient had an EMG n yesterday.  Needed min to mod assist for home exercises patient limited in functional use of right upper extremity in ADLs and IADLs.  Patient can benefit from skilled OT services to decrease pain and numbness increase motion and strength to return to prior level of function.  PERFORMANCE DEFICITS: in functional skills including ADLs, IADLs, coordination, dexterity, proprioception, sensation, edema, ROM, strength, pain, flexibility, Fine motor control, Gross motor control, decreased knowledge of precautions, and decreased knowledge of use of DME,  IMPAIRMENTS: are limiting patient from ADLs, IADLs, rest and sleep, work, play, leisure, and social participation.   COMORBIDITIES: may have co-morbidities  that affects occupational performance. Patient will benefit from skilled OT to address above impairments and improve overall function.  MODIFICATION OR ASSISTANCE TO COMPLETE EVALUATION: Min-Moderate modification of tasks or assist with assess necessary to complete an evaluation.  OT OCCUPATIONAL PROFILE AND HISTORY: Detailed assessment: Review of records and additional review of physical, cognitive, psychosocial history related to current functional performance.  CLINICAL DECISION MAKING: Moderate - several treatment options, min-mod task modification necessary  REHAB POTENTIAL: Good for goals  EVALUATION COMPLEXITY: Moderate      PLAN:  OT FREQUENCY: 1-2x/week  OT DURATION: 12 weeks  PLANNED INTERVENTIONS: 97168 OT Re-evaluation, 97535 self care/ADL training, 21308 therapeutic exercise, 97530 therapeutic activity, 97112 neuromuscular re-education, 97140 manual therapy, 97039 fluidotherapy, 97034 contrast bath, 97033 iontophoresis, passive range of  motion, patient/family education, and DME and/or AE instructions    CONSULTED AND AGREED WITH PLAN OF CARE: Patient   Heloise Lobo, OTR/L,CLT 12/31/2023, 5:36 PM

## 2024-01-07 ENCOUNTER — Ambulatory Visit: Admitting: Occupational Therapy

## 2024-01-07 DIAGNOSIS — M25631 Stiffness of right wrist, not elsewhere classified: Secondary | ICD-10-CM

## 2024-01-07 DIAGNOSIS — M79601 Pain in right arm: Secondary | ICD-10-CM

## 2024-01-07 DIAGNOSIS — M6281 Muscle weakness (generalized): Secondary | ICD-10-CM

## 2024-01-07 DIAGNOSIS — M25641 Stiffness of right hand, not elsewhere classified: Secondary | ICD-10-CM

## 2024-01-07 NOTE — Therapy (Signed)
 OUTPATIENT OCCUPATIONAL THERAPY ORTHO TREATMENT  Patient Name: Daniel Lawrence MRN: [id number] DOB:16-Nov-1960, 63 y.o., male Today's Date: 01/07/2024  PCP: Dr Marylynn REFERRING PROVIDER:Dr Valli  END OF SESSION:  OT End of Session - 01/07/24 1536     Visit Number 17    Number of Visits 24    Date for OT Re-Evaluation 02/25/24    OT Start Time 1536    OT Stop Time 1625    OT Time Calculation (min) 49 min    Activity Tolerance Patient tolerated treatment well    Behavior During Therapy [facility] for tasks assessed/performed          Past Medical History:  Diagnosis Date   Allergy    seasonal   Arthritis    left knee   Cancer (HCC)    skin cancer   Cataract    Hyperlipidemia    Hypertension    Past Surgical History:  Procedure Laterality Date   BRAIN SURGERY  1971   COLONOSCOPY  05/03/2021   ELEVATION OF DEPRESSED SKULL FRACTURE  07/16/1969   traumatic blow to head by golf club   , [facility])   MOHS SURGERY  09/14/2011   left temple, squamous   ROBOT ASSISTED LAPAROSCOPIC NEPHRECTOMY Right 04/20/2016   Procedure: XI ROBOTIC ASSISTED LAPAROSCOPIC RADICAL NEPHRECTOMY;  Surgeon: Ricardo Likens, MD;  Location: [facility];  Service: Urology;  Laterality: Right;   Patient Active Problem List   Diagnosis Date Noted   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 12/26/2023   Physical deconditioning 12/26/2023   Tubular adenoma of colon 05/29/2023   Hearing loss due to cerumen impaction, right 05/29/2023   AC (acromioclavicular) arthritis 02/27/2023   Increased prostate specific antigen (PSA) velocity 07/03/2022   Lipoma of right shoulder 05/21/2022   Low back pain 09/15/2021   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   Aortic atherosclerosis (HCC) 04/18/2021   Melanoma in situ of right upper arm (HCC) 04/18/2021   H/O right nephrectomy 04/17/2020   Degenerative arthritis of left knee 02/25/2018   Chronic pain of left knee 02/08/2018   CKD (chronic kidney disease) stage  3, GFR 30-59 ml/min (HCC) 02/08/2018   Coronary atherosclerosis due to lipid rich plaque 05/26/2016   History of renal cell carcinoma 04/20/2016   B12 deficiency 04/08/2016   Encounter for preventive health examination 06/24/2014   Hypertension 06/23/2014   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  And he will go over my nerve conduction test and my MRI.  I have a lot of nerve pain coming into my fingers.  Just randomly during the day.  I tried to keep the swelling down.  But they stiff.  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   Pt accompanied by: self  PERTINENT HISTORY: Patient had on 10/08/2023 by Dr. 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  Mostly at At medial elbow and wrist and hand. PRECAUTIONS: Patient to follow surgeon's precautions    WEIGHT BEARING RESTRICTIONS: no  PAIN:  Are you having pain?5 /10 nerve pain mostly in the right ulnar hand  FALLS: Has patient fallen in last 6 months? No  LIVING ENVIRONMENT: Lives with: lives with their spouse  PLOF: Patient prior to surgery had normal active range of motion and strength.  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  Working and  teaching yoga and doing things around the house  NEXT MD VISIT:?  OBJECTIVE:  Note: Objective measures were completed at Evaluation unless otherwise noted.  HAND DOMINANCE: Right  ADLs: Patient mostly limited by wrist and hand weakness.  Unable to grip objects and pick it up with weight as well as fine motor unable to pick up small objects.    UPPER EXTREMITY ROM at eval     Patient with decreased proprioception and coordination in right upper extremity range of motion.  Slow and deliberate. Pain and tenderness over right cubital tunnel Increased pain and tenderness over Guyon's canal As well as tenderness over carpal tunnel at volar wrist Bruising present over dorsal wrist. Patient reports he had some IVs in dorsal R wrist  And had a lot of swelling in R hand and wrist postop composite nerve glide and shoulder abduction had increased pain at stage 4 of 5.  Pain from elbow into wrist and hand.    Active ROM Right eval Left eval  Shoulder flexion    Shoulder abduction    Shoulder adduction    Shoulder extension    Shoulder internal rotation    Shoulder external rotation    Elbow flexion    Elbow extension    Wrist flexion 84   Wrist extension 64   Wrist ulnar deviation 20   Wrist radial deviation 25   Wrist pronation 90   Wrist supination 90      At eval  Extension or tapping of digits on right hand of table needs passive range of motion and placed on hold for second digit.  3rd through 5th 3 -/5 strength Lumbrical fist decreased PIP extension Intrinsic a fist within functional limits 3rd through 5th decrease in second digit Decrease strength for dorsal and volar interossei Composite fist three-quarter range Opening of digits limited at PIP extension after composite fist.  Active ROM Right eval Left eval R 10/31/23 R 11/05/23 R/12/03/23 R 12/17/23 Place and hold   Thumb MCP (0-60) 35       Thumb IP (0-80) 10       Thumb Radial abd/add (0-55) WFL     WFL    Thumb Palmar abd/add (0-45) impaired     Place and hold 45   Thumb Opposition to Small Finger Unable to do any opposition even to second digit   Opposed to middle phalanges of second digit Pick up 2 cm foam block to 3rd     Index MCP (0-90)    75 80 80 80  Index PIP (0-100)    35 35 60 70  Index DIP (0-70)          Long MCP (0-90)     75 80 80 80  Long PIP (0-100)     60 65 65 70  Long DIP (  0-70)          Ring MCP (0-90)     80 85 85 85  Ring PIP (0-100)     75 85 85 85  Ring DIP (0-70)          Little MCP (0-90)     80 90 90 90  Little PIP (0-100)     75 90 90 90  Little DIP (0-70)          (Blank rows = not tested)  HAND FUNCTION: Not tested evaluation  11/05/23 Grip strength: Right: 0 lbs; Left: 76 lbs, Lateral pinch: Right: 0 lbs, Left:   lbs, and 3 point pinch: Right: 0 lbs, Left:   lbs  COORDINATION: Impaired unable to make composite fist unable to do 2. Or 3 point pinch  SENSATION: Numbness in ulnar forearm into hand and fifth digit and ulnar side of fourth Report some pins-and-needles in DIP of third Patient with increased pain and tenderness over cubital tunnel as well as Guyon's canal.  Tenderness over volar wrist carpal tunnel  12/05/23: [place] Speed was done.  Patient feel only deep pressure on ulnar side of distal upper arm elbow forearm and hand. Also not able to fill hot and cold. 12/20/23 Semmes-Weinstein this date on ulnar upper arm to mid forearm 4.56  consistent and delayed but able to identify 4.31   EDEMA: Patient report had severe edema postop in right hand and wrist.  Continue to have some bruising over her dorsal wrist  COGNITION: Overall cognitive status: WNL      TREATMENT DATE: 01/07/24    Patient with reports of increased nerve pain in the digits. Patient arrive with increased sensation in all digits this date.  Positive Tinel at the distal palmar crease on ulnar side of hand Increase sensation in the 4th and 5th digits Able to feel all digits  this date.   Patient with decreased edema today.  But increased stiffness in the digits    Continue at home contrast but this date done moist heat  2 minutes and then flexion wrap done with Tubigrip 2 minutes again  Some soft tissue and lymphatic massage done repeat again 2 minutes with Tubigrip flexion wrap   Patient to facilitate daily lumbrical fist as well as passive range of motion for intrinsic assist Done several times repeatedly with patient in session today 3-4 sessions of 10 reps Followed by composite flexion using Tubigrip Composite passive range of motion done 20 reps Attempted placing hold 3 sets of 6 Using thumb red foam block for biofeedback and visual cueing to maintain placed on hold flexion of digits.   Focusing on digit extension in between flexion  Followed by facilitation of thumb palmar abduction  Assist Wall push-up with patient.  Favoring the right with shoulder little protracted and depressed. Patient to do in front of a mirror at home. Reviewed with patient shoulder exercises to do 3 times a week with supine protraction retraction with 2 pound cuff weight Shoulder horizontal abduction with 90 degree elbow flexion Can do yoga positions.  PATIENT EDUCATION: Education details: Progressing changes in  HEP  Person educated: Patient Education method: Explanation, Demonstration, Tactile cues, Verbal cues, and Handouts Education comprehension: verbalized understanding, returned demonstration, verbal cues required, and needs further education    GOALS: Goals reviewed with patient? Yes  LONG TERM GOALS: Target date: 12 wks   Patient to be independent in home program to modify right upper extremity in ADLs and IADLs as well as positioning during the day and night to decrease irritation on cubital tunnel and volar wrist with decreased  pain Baseline: Pain over the cubital tunnel and Guyon's canal 6-7/10.  With increased tenderness and pain increased numbness over her ulnar forearm wrist and hand into fifth and ulnar side of fourth digit Goal status: INITIAL  2.  Patient to be independent in home program to increase proprioception and ease  of right shoulder, elbow range of motion to normal reaching pattern  Baseline: Patient showed decreased coordination and proprioception in the reaching with the right upper extremity compensating with trunk and scapula Goal status: INITIAL  3.  Right wrist and forearm strength increased to 4+ to initiate strengthening and carrying groceries less than 4 pounds within no increase symptoms Baseline: Active range of motion within functional limits for supination pronation  but decreased wrist extension/ flexion decreased strength in ulnar deviation Goal status: Initial  4.  Digit active range of motion increased for patient to make composite fist and open hand to grasp around toothbrush and release, put hand in pocket Baseline: Three-quarter of a fist.  Decreased PIP extension with a lumbrical fist.  Decreased intrinsic first and second digit unable to touch any fingertips with opposition.  Numbness on ulnar side of forearm into the wrist hand fifth digit and ulnar fourth Goal status: INITIAL  5.  Patient to be independent home program to decrease pain and tenderness from elbow to hand to less than 2/10 Baseline: Pain 6-7/10 at cubital tunnel and volar wrist and Ganz canal Goal status: INITIAL  6.  Thumb active range of motion improved to within functional limits to be able to do palmar abduction to grasp around 4 cm object as well as increased opposition to pick up medication Baseline: Patient lacking 1 to 2 cm for opposition to second digit.  Unable to do opposition to any digits.  Decreased palmar abduction.  Radial abduction 45 degrees Goal status: INITIAL    ASSESSMENT:  CLINICAL  IMPRESSION: Patient patient followed by OT for increased weakness, increased pain and numbness in right dominant upper extremity since after surgery.  Patient had on 10/08/2023 a R VATS nerve sheath tumor excision -and hx of nephrectomy .  Patient present with increased pain and tenderness over right cubital tunnel as well as Guyon canal and carpal tunnel Patient with decreased coordination and proprioception and range of motion of right shoulder and elbow in ADLs and IADLs.  Patient with decreased range of motion and strength in her right wrist ,digits and thumb.  Most weakness stiffness in thumb palmar abduction, digit flexion extension as well as interosseous.  NOW  Patient with increased range of motion and strength in right shoulder , elbow and forearm - wrist is improving.    Patient this date with increased sensation and nerve pain in her right hand Tinel at carpal tunnel and palm as well as positive Tinel's at the ulnar nerve at distal palmar crease with increased sensation in the digits.  Extension tightly focused on passive range of motion for digits in all joints  this date.  Reviewed with patient importance of home program to maintain passive range of motion with decrease edema -for motion to return.  Also importance of shoulder strengthening exercises at least 3 times a week.    Needed min to mod assist for home exercises patient limited in functional use of right upper extremity in ADLs and IADLs.  Patient can benefit from skilled OT services to decrease pain and numbness increase motion and strength to return to prior level of function.  PERFORMANCE DEFICITS: in functional skills including ADLs, IADLs, coordination, dexterity, proprioception, sensation, edema, ROM, strength, pain, flexibility, Fine motor control, Gross motor control, decreased knowledge of precautions, and decreased knowledge of use of DME,  IMPAIRMENTS: are limiting patient from ADLs, IADLs, rest and sleep, work, play, leisure, and  social participation.   COMORBIDITIES: may have co-morbidities  that affects occupational performance. Patient will benefit from skilled OT to address above impairments and improve overall function.  MODIFICATION OR ASSISTANCE TO COMPLETE EVALUATION: Min-Moderate modification of tasks or assist with assess necessary to complete an evaluation.  OT OCCUPATIONAL PROFILE AND HISTORY: Detailed assessment: Review of records and additional review of physical, cognitive, psychosocial history related to current functional performance.  CLINICAL DECISION MAKING: Moderate - several treatment options, min-mod task modification necessary  REHAB POTENTIAL: Good for goals  EVALUATION COMPLEXITY: Moderate      PLAN:  OT FREQUENCY: 1-2x/week  OT DURATION: 12 weeks  PLANNED INTERVENTIONS: 97168 OT Re-evaluation, 97535 self care/ADL training, 02889 therapeutic exercise, 97530 therapeutic activity, 97112 neuromuscular re-education, 97140 manual therapy, 97039 fluidotherapy, 97034 contrast bath, 97033 iontophoresis, passive range of motion, patient/family education, and DME and/or AE instructions    CONSULTED AND AGREED WITH PLAN OF CARE: Patient   Ancel Peters, OTR/L,CLT 01/07/2024, 5:14 PM

## 2024-01-14 ENCOUNTER — Ambulatory Visit: Attending: Thoracic Surgery (Cardiothoracic Vascular Surgery) | Admitting: Occupational Therapy

## 2024-01-14 DIAGNOSIS — M79601 Pain in right arm: Secondary | ICD-10-CM | POA: Insufficient documentation

## 2024-01-14 DIAGNOSIS — M25641 Stiffness of right hand, not elsewhere classified: Secondary | ICD-10-CM | POA: Diagnosis not present

## 2024-01-14 DIAGNOSIS — M25631 Stiffness of right wrist, not elsewhere classified: Secondary | ICD-10-CM | POA: Insufficient documentation

## 2024-01-14 DIAGNOSIS — M6281 Muscle weakness (generalized): Secondary | ICD-10-CM | POA: Diagnosis not present

## 2024-01-14 NOTE — Therapy (Signed)
 OUTPATIENT OCCUPATIONAL THERAPY ORTHO TREATMENT  Patient Name: Daniel Lawrence MRN: [id number] DOB:[birth year], 63 y.o., male Today's Date: 01/14/2024  PCP: Dr Marylynn REFERRING PROVIDER:Dr Valli  END OF SESSION:  OT End of Session - 01/14/24 1403     Visit Number 18    Number of Visits 24    Date for OT Re-Evaluation 02/25/24    OT Start Time 1403    OT Stop Time 1500    OT Time Calculation (min) 57 min    Activity Tolerance Patient tolerated treatment well    Behavior During Therapy [facility] for tasks assessed/performed           Past Medical History:  Diagnosis Date   Allergy    seasonal   Arthritis    left knee   Cancer (HCC)    skin cancer   Cataract    Hyperlipidemia    Hypertension    Past Surgical History:  Procedure Laterality Date   BRAIN SURGERY  1971   COLONOSCOPY  05/03/2021   ELEVATION OF DEPRESSED SKULL FRACTURE  07/16/1969   traumatic blow to head by golf club   , [facility])   MOHS SURGERY  09/14/2011   left temple, squamous   ROBOT ASSISTED LAPAROSCOPIC NEPHRECTOMY Right 04/20/2016   Procedure: XI ROBOTIC ASSISTED LAPAROSCOPIC RADICAL NEPHRECTOMY;  Surgeon: Ricardo Likens, MD;  Location: [facility];  Service: Urology;  Laterality: Right;   Patient Active Problem List   Diagnosis Date Noted   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 12/26/2023   Physical deconditioning 12/26/2023   Tubular adenoma of colon 05/29/2023   Hearing loss due to cerumen impaction, right 05/29/2023   AC (acromioclavicular) arthritis 02/27/2023   Increased prostate specific antigen (PSA) velocity 07/03/2022   Lipoma of right shoulder 05/21/2022   Low back pain 09/15/2021   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   Aortic atherosclerosis (HCC) 04/18/2021   Melanoma in situ of right upper arm (HCC) 04/18/2021   H/O right nephrectomy 04/17/2020   Degenerative arthritis of left knee 02/25/2018   Chronic pain of left knee 02/08/2018   CKD (chronic kidney disease) stage  3, GFR 30-59 ml/min (HCC) 02/08/2018   Coronary atherosclerosis due to lipid rich plaque 05/26/2016   History of renal cell carcinoma 04/20/2016   B12 deficiency 04/08/2016   Encounter for preventive health examination 06/24/2014   Hypertension 06/23/2014   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  I definitely have more feeling coming in around the elbow into my forearm.  More sensation my hand.  I can definitely feel like and trusting my arm a lot more. Pt accompanied by: self  PERTINENT HISTORY: Patient had on 10/08/2023 by Dr. 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  Mostly at At medial elbow and wrist and hand. PRECAUTIONS: Patient to follow surgeon's  precautions    WEIGHT BEARING RESTRICTIONS: no  PAIN:  Are you having pain?5 /10 nerve pain mostly in the right ulnar hand  FALLS: Has patient fallen in last 6 months? No  LIVING ENVIRONMENT: Lives with: lives with their spouse  PLOF: Patient prior to surgery had normal active range of motion and strength.  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  Working and teaching yoga and doing things around the house  NEXT MD  VISIT:?  OBJECTIVE:  Note: Objective measures were completed at Evaluation unless otherwise noted.  HAND DOMINANCE: Right  ADLs: Patient mostly limited by wrist and hand weakness.  Unable to grip objects and pick it up with weight as well as fine motor unable to pick up small objects.    UPPER EXTREMITY ROM at eval     Patient with decreased proprioception and coordination in right upper extremity range of motion.  Slow and deliberate. Pain and tenderness over right cubital tunnel Increased pain and tenderness over Guyon's canal As well as tenderness over carpal tunnel at volar wrist Bruising present over dorsal wrist. Patient reports he had some IVs in dorsal R wrist  And had a lot of swelling in R hand and wrist postop composite nerve glide and shoulder abduction had increased pain at stage 4 of 5.  Pain from elbow into wrist and hand.    Active ROM Right eval Left eval  Shoulder flexion    Shoulder abduction    Shoulder adduction    Shoulder extension    Shoulder internal rotation    Shoulder external rotation    Elbow flexion    Elbow extension    Wrist flexion 84   Wrist extension 64   Wrist ulnar deviation 20   Wrist radial deviation 25   Wrist pronation 90   Wrist supination 90      At eval  Extension or tapping of digits on right hand of table needs passive range of motion and placed on hold for second digit.  3rd through 5th 3 -/5 strength Lumbrical fist decreased PIP extension Intrinsic a fist within functional limits 3rd through 5th decrease in second digit Decrease strength for dorsal and volar interossei Composite fist three-quarter range Opening of digits limited at PIP extension after composite fist.  Active ROM Right eval Left eval R 10/31/23 R 11/05/23 R/12/03/23 R 12/17/23 Place and hold   Thumb MCP (0-60) 35       Thumb IP (0-80) 10       Thumb Radial abd/add (0-55) WFL     WFL   Thumb Palmar abd/add (0-45) impaired     Place and hold 45    Thumb Opposition to Small Finger Unable to do any opposition even to second digit   Opposed to middle phalanges of second digit Pick up 2 cm foam block to 3rd     Index MCP (0-90)    75 80 80 80  Index PIP (0-100)    35 35 60 70  Index DIP (0-70)          Long MCP (0-90)     75 80 80 80  Long PIP (0-100)     60 65 65 70  Long DIP (0-70)          Ring MCP (0-90)     80 85  85 85  Ring PIP (0-100)     75 85 85 85  Ring DIP (0-70)          Little MCP (0-90)     80 90 90 90  Little PIP (0-100)     75 90 90 90  Little DIP (0-70)          (Blank rows = not tested)  HAND FUNCTION: Not tested evaluation  11/05/23 Grip strength: Right: 0 lbs; Left: 76 lbs, Lateral pinch: Right: 0 lbs, Left:   lbs, and 3 point pinch: Right: 0 lbs, Left:   lbs  COORDINATION: Impaired unable to make composite fist unable to do 2. Or 3 point pinch  SENSATION: Numbness in ulnar forearm into hand and fifth digit and ulnar side of fourth Report some pins-and-needles in DIP of third Patient with increased pain and tenderness over cubital tunnel as well as Guyon's canal.  Tenderness over volar wrist carpal tunnel  12/05/23: [place] was done.  Patient feel only deep pressure on ulnar side of distal upper arm elbow forearm and hand. Also not able to fill hot and cold. 12/20/23 Semmes-Weinstein this date on ulnar upper arm to mid forearm 4.56  consistent and delayed but able to identify 4.31   EDEMA: Patient report had severe edema postop in right hand and wrist.  Continue to have some bruising over her dorsal wrist  COGNITION: Overall cognitive status: WNL      TREATMENT DATE: 01/14/24    Patient with reports of increased nerve pain in the digits. Patient arrive with increased sensation in all digits this date.  Positive Tinel at the distal palmar crease on ulnar side of hand Increase sensation in the 4th and 5th digits Able to feel all digits this date.   Able to adduct pinky.  Patient able to put hand  in pocket now.  Of pinky floating out Patient with decreased edema today.  Edema mostly in the 2nd and 3rd increased more than 0.5 cm compared to the left Fit with new Isotoner glove Increase stiffness in the digits  This date done passive range of motion-prayer stretch for wrist extension followed by wall slides Followed by wall push-ups tolerating well pressure through the palm Weightbearing through the palms with scapular retraction shoulder horizontal abduction alternating left and right while weightbearing through the opposite All 12 reps Patient can add to home exercises  Patient is a yoga instructor Had patient weight-bear through elbow on pillow with applying through the knee able to do scapular retraction with elbow flexion at 90 alternating can add to home exercises 12 reps Prayer stretch 1 minute Plank through elbows and knee can do thoracic straight through and horizontal abduction alternating 12 reps  Patient can do floor activities through the elbows with cushioning and do wall activities through the palm After doing wrist extension stretches   Patient to continue with contrast at home Done contrast this date 2 minutes heat in 1 minute ice alternating 3 times Ace wrap digits in into flexion the 2nd and 3rd heat Patient can do the same at home using a Tubigrip D to pull digits into flexion And some lymphatic massage as well as joint mobs to the wrist-carpals-metacarpals and digits  Patient to facilitate daily lumbrical fist with PIP extension  Done 12-15 reps by OT  Followed by passive range of motion to DIP PIP flexion into intrinsic a fist 12 reps Followed by composite flexion passive range of motion to palm. Able to  do 5th and 4th to palm.  2nd and 3rd partial three quarters range with the fourth/10 pain  Place and hold into a foam block 10 reps Patient has a PT friend that can help him with above hand exercises.  Written instructions provided   Patient to  continue with home exercises for thumb red foam block for biofeedback and visual cueing to maintain placed on hold flexion of digits. Focusing on digit extension in between flexion  Followed by facilitation of thumb palmar abduction                                                                                                                    PATIENT EDUCATION: Education details: Progressing changes in  HEP  Person educated: Patient Education method: Explanation, Demonstration, Tactile cues, Verbal cues, and Handouts Education comprehension: verbalized understanding, returned demonstration, verbal cues required, and needs further education    GOALS: Goals reviewed with patient? Yes  LONG TERM GOALS: Target date: 12 wks   Patient to be independent in home program to modify right upper extremity in ADLs and IADLs as well as positioning during the day and night to decrease irritation on cubital tunnel and volar wrist with decreased pain Baseline: Pain over the cubital tunnel and Guyon's canal 6-7/10.  With increased tenderness and pain increased numbness over her ulnar forearm wrist and hand into fifth and ulnar side of fourth digit Goal status: INITIAL  2.  Patient to be independent in home program to increase proprioception and ease  of right shoulder, elbow range of motion to normal reaching pattern  Baseline: Patient showed decreased coordination and proprioception in the reaching with the right upper extremity compensating with trunk and scapula Goal status: INITIAL  3.  Right wrist and forearm strength increased to 4+ to initiate strengthening and carrying groceries less than 4 pounds within no increase symptoms Baseline: Active range of motion within functional limits for supination pronation  but decreased wrist extension/ flexion decreased strength in ulnar deviation Goal status: Initial  4.  Digit active range of motion increased for patient to make composite  fist and open hand to grasp around toothbrush and release, put hand in pocket Baseline: Three-quarter of a fist.  Decreased PIP extension with a lumbrical fist.  Decreased intrinsic first and second digit unable to touch any fingertips with opposition.  Numbness on ulnar side of forearm into the wrist hand fifth digit and ulnar fourth Goal status: INITIAL  5.  Patient to be independent home program to decrease pain and tenderness from elbow to hand to less than 2/10 Baseline: Pain 6-7/10 at cubital tunnel and volar wrist and Ganz canal Goal status: INITIAL  6.  Thumb active range of motion improved to within functional limits to be able to do palmar abduction to grasp around 4 cm object as well as increased opposition to pick up medication Baseline: Patient lacking 1 to 2 cm for opposition to second digit.  Unable to do opposition to any digits.  Decreased  palmar abduction.  Radial abduction 45 degrees Goal status: INITIAL    ASSESSMENT:  CLINICAL IMPRESSION: Patient patient followed by OT for increased weakness, increased pain and numbness in right dominant upper extremity since after surgery.  Patient had on 10/08/2023 a R VATS nerve sheath tumor excision -and hx of nephrectomy .  Patient present with increased pain and tenderness over right cubital tunnel as well as Guyon canal and carpal tunnel Patient with decreased coordination and proprioception and range of motion of right shoulder and elbow in ADLs and IADLs.  Patient with decreased range of motion and strength in her right wrist ,digits and thumb.  Most weakness stiffness in thumb palmar abduction, digit flexion extension as well as interosseous.  NOW  Patient with increased range of motion and strength in right shoulder , elbow and forearm -and wrist is improving.  This date able to review and add with patient you have modified yoga poses.  Patient is a Marine scientist part-time.  Patient can do weightbearing through the elbow and the  knees for modified plank with scapular and shoulder stabilization exercises.  On the wall after passive range of motion to the wrist patient can do weightbearing activities and proximal stability exercises through the palm on the wall.  Patient this date with increased sensation in the right upper arm and elbow into the forearm -increase sensation in the digits including 5th and 4th.  Less nerve pain, Tinel or tenderness over the carpal tunnel.  Positive Tinel's at the ulnar nerve at [facility] with increased sensation in the digits.  Focus on passive range of motion for digits in all joints this date.  Reviewed with patient home exercises.  Patient has a friend that is a retired PT that can assist.   Needed min to mod assist for home exercises patient limited in functional use of right upper extremity in ADLs and IADLs.  Patient can benefit from skilled OT services to decrease pain and numbness increase motion and strength to return to prior level of function.  PERFORMANCE DEFICITS: in functional skills including ADLs, IADLs, coordination, dexterity, proprioception, sensation, edema, ROM, strength, pain, flexibility, Fine motor control, Gross motor control, decreased knowledge of precautions, and decreased knowledge of use of DME,  IMPAIRMENTS: are limiting patient from ADLs, IADLs, rest and sleep, work, play, leisure, and social participation.   COMORBIDITIES: may have co-morbidities  that affects occupational performance. Patient will benefit from skilled OT to address above impairments and improve overall function.  MODIFICATION OR ASSISTANCE TO COMPLETE EVALUATION: Min-Moderate modification of tasks or assist with assess necessary to complete an evaluation.  OT OCCUPATIONAL PROFILE AND HISTORY: Detailed assessment: Review of records and additional review of physical, cognitive, psychosocial history related to current functional performance.  CLINICAL DECISION MAKING: Moderate - several treatment options,  min-mod task modification necessary  REHAB POTENTIAL: Good for goals  EVALUATION COMPLEXITY: Moderate      PLAN:  OT FREQUENCY: 1-2x/week  OT DURATION: 12 weeks  PLANNED INTERVENTIONS: 97168 OT Re-evaluation, 97535 self care/ADL training, 02889 therapeutic exercise, 97530 therapeutic activity, 97112 neuromuscular re-education, 97140 manual therapy, 97039 fluidotherapy, 97034 contrast bath, 97033 iontophoresis, passive range of motion, patient/family education, and DME and/or AE instructions    CONSULTED AND AGREED WITH PLAN OF CARE: Patient   Ancel Peters, OTR/L,CLT 01/14/2024, 3:06 PM

## 2024-01-15 DIAGNOSIS — R221 Localized swelling, mass and lump, neck: Secondary | ICD-10-CM | POA: Diagnosis not present

## 2024-01-15 DIAGNOSIS — G540 Brachial plexus disorders: Secondary | ICD-10-CM | POA: Diagnosis not present

## 2024-01-16 ENCOUNTER — Ambulatory Visit: Admitting: Occupational Therapy

## 2024-01-16 DIAGNOSIS — M25641 Stiffness of right hand, not elsewhere classified: Secondary | ICD-10-CM | POA: Diagnosis not present

## 2024-01-16 DIAGNOSIS — M25631 Stiffness of right wrist, not elsewhere classified: Secondary | ICD-10-CM

## 2024-01-16 DIAGNOSIS — M6281 Muscle weakness (generalized): Secondary | ICD-10-CM | POA: Diagnosis not present

## 2024-01-16 DIAGNOSIS — M79601 Pain in right arm: Secondary | ICD-10-CM | POA: Diagnosis not present

## 2024-01-16 NOTE — Therapy (Signed)
 OUTPATIENT OCCUPATIONAL THERAPY ORTHO TREATMENT  Patient Name: Daniel Lawrence MRN: [id number] DOB:12-12-1960, 63 y.o., male Today's Date: 01/16/2024  PCP: Dr Marylynn REFERRING PROVIDER:Dr Valli  END OF SESSION:  OT End of Session - 01/16/24 1647     Visit Number 19    Number of Visits 24    Date for OT Re-Evaluation 02/25/24    OT Start Time 1617    OT Stop Time 1713    OT Time Calculation (min) 56 min    Activity Tolerance Patient tolerated treatment well    Behavior During Therapy [facility] for tasks assessed/performed           Past Medical History:  Diagnosis Date   Allergy    seasonal   Arthritis    left knee   Cancer (HCC)    skin cancer   Cataract    Hyperlipidemia    Hypertension    Past Surgical History:  Procedure Laterality Date   BRAIN SURGERY  1971   COLONOSCOPY  05/03/2021   ELEVATION OF DEPRESSED SKULL FRACTURE  07/16/1969   traumatic blow to head by golf club   , [facility])   MOHS SURGERY  09/14/2011   left temple, squamous   ROBOT ASSISTED LAPAROSCOPIC NEPHRECTOMY Right 04/20/2016   Procedure: XI ROBOTIC ASSISTED LAPAROSCOPIC RADICAL NEPHRECTOMY;  Surgeon: Ricardo Likens, MD;  Location: [facility];  Service: Urology;  Laterality: Right;   Patient Active Problem List   Diagnosis Date Noted   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 12/26/2023   Physical deconditioning 12/26/2023   Tubular adenoma of colon 05/29/2023   Hearing loss due to cerumen impaction, right 05/29/2023   AC (acromioclavicular) arthritis 02/27/2023   Increased prostate specific antigen (PSA) velocity 07/03/2022   Lipoma of right shoulder 05/21/2022   Low back pain 09/15/2021   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   Aortic atherosclerosis (HCC) 04/18/2021   Melanoma in situ of right upper arm (HCC) 04/18/2021   H/O right nephrectomy 04/17/2020   Degenerative arthritis of left knee 02/25/2018   Chronic pain of left knee 02/08/2018   CKD (chronic kidney disease) stage  3, GFR 30-59 ml/min (HCC) 02/08/2018   Coronary atherosclerosis due to lipid rich plaque 05/26/2016   History of renal cell carcinoma 04/20/2016   B12 deficiency 04/08/2016   Encounter for preventive health examination 06/24/2014   Hypertension 06/23/2014   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  And the new glove is really helping for the swelling .  The other day my hand looks like the send in the mother hand.   Pt accompanied by: self  PERTINENT HISTORY: Patient had on 10/08/2023 by Dr. 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  Mostly at At medial elbow and wrist and hand. PRECAUTIONS: Patient to follow surgeon's precautions    WEIGHT BEARING RESTRICTIONS: no  PAIN:  Are you having pain?5 /10 nerve pain mostly in the right ulnar hand  FALLS: Has patient fallen in last 6 months? No  LIVING ENVIRONMENT: Lives with: lives with their spouse  PLOF: Patient prior to surgery had normal active range of motion and strength.  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  Working and teaching yoga and doing things around the house  NEXT MD VISIT:?  OBJECTIVE:  Note: Objective measures were completed at  Evaluation unless otherwise noted.  HAND DOMINANCE: Right  ADLs: Patient mostly limited by wrist and hand weakness.  Unable to grip objects and pick it up with weight as well as fine motor unable to pick up small objects.    UPPER EXTREMITY ROM at eval     Patient with decreased proprioception and coordination in right upper extremity range of motion.  Slow and deliberate. Pain and tenderness over right cubital tunnel Increased pain and tenderness over Guyon's canal As well as tenderness over carpal tunnel at volar wrist Bruising present over dorsal wrist. Patient reports he had some IVs in dorsal R wrist  And had a lot of swelling in R hand and wrist postop composite nerve glide and shoulder abduction had increased pain at stage 4 of 5.  Pain from elbow into wrist and hand.    Active ROM Right eval Left eval  Shoulder flexion    Shoulder abduction    Shoulder adduction    Shoulder extension    Shoulder internal rotation    Shoulder external rotation    Elbow flexion    Elbow extension    Wrist flexion 84   Wrist extension 64   Wrist ulnar deviation 20   Wrist radial deviation 25   Wrist pronation 90   Wrist supination 90      At eval  Extension or tapping of digits on right hand of table needs passive range of motion and placed on hold for second digit.  3rd through 5th 3 -/5 strength Lumbrical fist decreased PIP extension Intrinsic a fist within functional limits 3rd through 5th decrease in second digit Decrease strength for dorsal and volar interossei Composite fist three-quarter range Opening of digits limited at PIP extension after composite fist.  Active ROM Right eval Left eval R 10/31/23 R 11/05/23 R/12/03/23 R 12/17/23 Place and hold  R 01/16/24  Thumb MCP (0-60) 35      48/ L 50  Thumb IP (0-80) 10      30 / L 70  Thumb Radial abd/add (0-55) WFL     WFL  55 including EPB  Thumb Palmar abd/add (0-45) impaired     Place and hold 45  Able to do AAROM over  ball   Thumb Opposition to Small Finger Unable to do any opposition even to second digit   Opposed to middle phalanges of second digit Pick up 2 cm foam block to 3rd      Index MCP (0-90)    75 80 80 80   Index PIP (0-100)    35 35 60 70   Index DIP (0-70)           Long MCP (0-90)     75 80 80 80   Long PIP (0-100)     60 65 65 70   Long DIP (0-70)  Ring MCP (0-90)     80 85 85 85   Ring PIP (0-100)     75 85 85 85   Ring DIP (0-70)           Little MCP (0-90)     80 90 90 90   Little PIP (0-100)     75 90 90 90   Little DIP (0-70)           (Blank rows = not tested)  HAND FUNCTION: Not tested evaluation  11/05/23 Grip strength: Right: 0 lbs; Left: 76 lbs, Lateral pinch: Right: 0 lbs, Left:   lbs, and 3 point pinch: Right: 0 lbs, Left:   lbs  COORDINATION: Impaired unable to make composite fist unable to do 2. Or 3 point pinch  SENSATION: Numbness in ulnar forearm into hand and fifth digit and ulnar side of fourth Report some pins-and-needles in DIP of third Patient with increased pain and tenderness over cubital tunnel as well as Guyon's canal.  Tenderness over volar wrist carpal tunnel  12/05/23: [place] Speed was done.  Patient feel only deep pressure on ulnar side of distal upper arm elbow forearm and hand. Also not able to fill hot and cold. 12/20/23 Semmes-Weinstein this date on ulnar upper arm to mid forearm 4.56  consistent and delayed but able to identify 4.31   EDEMA: Patient report had severe edema postop in right hand and wrist.  Continue to have some bruising over her dorsal wrist  COGNITION: Overall cognitive status: WNL      TREATMENT DATE: 01/16/24    Patient with reports of increased nerve pain in the palm and digits. Patient arrive with increased sensation in all digits this date.  Positive Tinel at the distal palmar crease on ulnar side of hand Increase sensation in the 4th and 5th digits Able to feel all digits  Able to adduct pinky.  Patient  able to put hand in pocket now.  Of pinky floating out Fit with new Isotoner glove last session.-Patient report decreased edema.  Most edema still in the 2nd and 3rd digits as well as much palm dorsal hand    HEP This date done passive range of motion-prayer stretch for wrist extension followed by wall slides Followed by wall push-ups tolerating well pressure through the palm Weightbearing through the palms with scapular retraction shoulder horizontal abduction alternating left and right while weightbearing through the opposite All 12 reps Patient can add to home exercises   HEP Patient is a yoga instructor Had patient weight-bear through elbow on pillow with applying through the knee able to do scapular retraction with elbow flexion at 90 alternating can add to home exercises 12 reps Prayer stretch 1 minute Plank through elbows and knee can do thoracic straight through and horizontal abduction alternating 12 reps Patient can do floor activities through the elbows with cushioning and do wall activities through the palm After doing wrist extension stretches   Patient to continue with contrast at home Done This date with moist heat Ace wrap digits in into flexion to 2 minutes in the heat unwrapped to extension 2 minutes again extension  patient can do the same at home using a Tubigrip D to pull digits into flexion And some lymphatic massage as well as joint mobs to the wrist-carpals-metacarpals and digits  Patient to facilitate daily lumbrical fist with PIP extension  Done 12-15 reps by OT  Followed by passive range of motion to DIP flexion  / PIP flexion into  intrinsic fist 12 reps Followed by composite flexion passive range of motion to palm. Able to do 5th and 4th to palm.  2nd and 3rd partial three quarters range with the fourth/10 pain End of session patient able to grasp tennis ball.  As well as a lacrosse ball.  With 3rd through 5th Able to hold with a place and hold  second. Patient could toss ball 2 x 6 reps Patient has a PT friend that can help him with above hand exercises.  Written instructions provided Patient this date also able to facilitate palmar abduction sliding over lacrosse ball or baseball. Patient able to show active range of motion three quarters active assist to finish. Patient also able to do radial abduction including extensor brevis able to add rubber band for radial abduction At home exercises                                                                                                                     PATIENT EDUCATION: Education details: Progressing changes in  HEP  Person educated: Patient Education method: Explanation, Demonstration, Tactile cues, Verbal cues, and Handouts Education comprehension: verbalized understanding, returned demonstration, verbal cues required, and needs further education    GOALS: Goals reviewed with patient? Yes  LONG TERM GOALS: Target date: 12 wks   Patient to be independent in home program to modify right upper extremity in ADLs and IADLs as well as positioning during the day and night to decrease irritation on cubital tunnel and volar wrist with decreased pain Baseline: Pain over the cubital tunnel and Guyon's canal 6-7/10.  With increased tenderness and pain increased numbness over her ulnar forearm wrist and hand into fifth and ulnar side of fourth digit Goal status: INITIAL  2.  Patient to be independent in home program to increase proprioception and ease  of right shoulder, elbow range of motion to normal reaching pattern  Baseline: Patient showed decreased coordination and proprioception in the reaching with the right upper extremity compensating with trunk and scapula Goal status: INITIAL  3.  Right wrist and forearm strength increased to 4+ to initiate strengthening and carrying groceries less than 4 pounds within no increase symptoms Baseline: Active range of motion  within functional limits for supination pronation  but decreased wrist extension/ flexion decreased strength in ulnar deviation Goal status: Initial  4.  Digit active range of motion increased for patient to make composite fist and open hand to grasp around toothbrush and release, put hand in pocket Baseline: Three-quarter of a fist.  Decreased PIP extension with a lumbrical fist.  Decreased intrinsic first and second digit unable to touch any fingertips with opposition.  Numbness on ulnar side of forearm into the wrist hand fifth digit and ulnar fourth Goal status: INITIAL  5.  Patient to be independent home program to decrease pain and tenderness from elbow to hand to less than 2/10 Baseline: Pain 6-7/10 at cubital tunnel and volar wrist and Ganz canal Goal status: INITIAL  6.  Thumb active range of motion improved to within functional limits to be able to do palmar abduction to grasp around 4 cm object as well as increased opposition to pick up medication Baseline: Patient lacking 1 to 2 cm for opposition to second digit.  Unable to do opposition to any digits.  Decreased palmar abduction.  Radial abduction 45 degrees Goal status: INITIAL    ASSESSMENT:  CLINICAL IMPRESSION: Patient patient followed by OT for increased weakness, increased pain and numbness in right dominant upper extremity since after surgery.  Patient had on 10/08/2023 a R VATS nerve sheath tumor excision -and hx of nephrectomy .  Patient present with increased pain and tenderness over right cubital tunnel as well as Guyon canal and carpal tunnel Patient with decreased coordination and proprioception and range of motion of right shoulder and elbow in ADLs and IADLs.  Patient with decreased range of motion and strength in her right wrist ,digits and thumb.  Most weakness stiffness in thumb palmar abduction, digit flexion extension as well as interosseous.  NOW  Patient with increased range of motion and strength in right  shoulder , elbow and forearm -and wrist is improving.  This week able to review and add with patient you have modified yoga poses.  Patient is a Marine scientist part-time.  Patient can do weightbearing through the elbow and the knees for modified plank with scapular and shoulder stabilization exercises.  On the wall after passive range of motion to the wrist patient can do weightbearing activities and proximal stability exercises through the palm on the wall.  Patient this week with increased sensation in the right upper arm and elbow into the forearm -increase sensation in the digits including 5th and 4th.  Less nerve pain, Tinel or tenderness over the carpal tunnel.  Positive Tinel's at the ulnar nerve at [facility] with increased sensation in the digits.  Focus on passive range of motion for digits in all joints this week - this date increased active assisted range of motion for palmar abduction.  Was able to add strengthening for radial abduction.  Patient was able to at the end of session grasp tennis ball and lacrosse ball and toss.  Reviewed with patient home exercises.  Patient has a friend that is a retired PT that can assist.   Needed min to mod assist for home exercises patient limited in functional use of right upper extremity in ADLs and IADLs.  Patient can benefit from skilled OT services to decrease pain and numbness increase motion and strength to return to prior level of function.  PERFORMANCE DEFICITS: in functional skills including ADLs, IADLs, coordination, dexterity, proprioception, sensation, edema, ROM, strength, pain, flexibility, Fine motor control, Gross motor control, decreased knowledge of precautions, and decreased knowledge of use of DME,  IMPAIRMENTS: are limiting patient from ADLs, IADLs, rest and sleep, work, play, leisure, and social participation.   COMORBIDITIES: may have co-morbidities  that affects occupational performance. Patient will benefit from skilled OT to address above  impairments and improve overall function.  MODIFICATION OR ASSISTANCE TO COMPLETE EVALUATION: Min-Moderate modification of tasks or assist with assess necessary to complete an evaluation.  OT OCCUPATIONAL PROFILE AND HISTORY: Detailed assessment: Review of records and additional review of physical, cognitive, psychosocial history related to current functional performance.  CLINICAL DECISION MAKING: Moderate - several treatment options, min-mod task modification necessary  REHAB POTENTIAL: Good for goals  EVALUATION COMPLEXITY: Moderate      PLAN:  OT FREQUENCY: 1-2x/week  OT DURATION: 12  weeks  PLANNED INTERVENTIONS: 97168 OT Re-evaluation, 97535 self care/ADL training, 02889 therapeutic exercise, 97530 therapeutic activity, 97112 neuromuscular re-education, 97140 manual therapy, 97039 fluidotherapy, 97034 contrast bath, 97033 iontophoresis, passive range of motion, patient/family education, and DME and/or AE instructions    CONSULTED AND AGREED WITH PLAN OF CARE: Patient   Ancel Peters, OTR/L,CLT 01/16/2024, 5:15 PM

## 2024-01-21 DIAGNOSIS — D492 Neoplasm of unspecified behavior of bone, soft tissue, and skin: Secondary | ICD-10-CM | POA: Diagnosis not present

## 2024-01-21 DIAGNOSIS — G540 Brachial plexus disorders: Secondary | ICD-10-CM | POA: Diagnosis not present

## 2024-01-21 DIAGNOSIS — M79601 Pain in right arm: Secondary | ICD-10-CM | POA: Diagnosis not present

## 2024-01-21 DIAGNOSIS — X58XXXA Exposure to other specified factors, initial encounter: Secondary | ICD-10-CM | POA: Diagnosis not present

## 2024-01-21 DIAGNOSIS — S143XXA Injury of brachial plexus, initial encounter: Secondary | ICD-10-CM | POA: Diagnosis not present

## 2024-01-21 DIAGNOSIS — Z789 Other specified health status: Secondary | ICD-10-CM | POA: Diagnosis not present

## 2024-01-21 DIAGNOSIS — M25621 Stiffness of right elbow, not elsewhere classified: Secondary | ICD-10-CM | POA: Diagnosis not present

## 2024-01-31 ENCOUNTER — Ambulatory Visit: Admitting: Occupational Therapy

## 2024-01-31 DIAGNOSIS — M25641 Stiffness of right hand, not elsewhere classified: Secondary | ICD-10-CM

## 2024-01-31 DIAGNOSIS — M25631 Stiffness of right wrist, not elsewhere classified: Secondary | ICD-10-CM

## 2024-01-31 DIAGNOSIS — M79601 Pain in right arm: Secondary | ICD-10-CM | POA: Diagnosis not present

## 2024-01-31 DIAGNOSIS — M6281 Muscle weakness (generalized): Secondary | ICD-10-CM

## 2024-01-31 NOTE — Therapy (Signed)
 OUTPATIENT OCCUPATIONAL THERAPY ORTHO TREATMENT/20th visit  Patient Name: Daniel Lawrence MRN: [id number] DOB:May 03, 1961, 63 y.o., male Today's Date: 01/31/2024  PCP: Dr Marylynn REFERRING PROVIDER:Dr Valli  END OF SESSION:  OT End of Session - 01/31/24 1031     Visit Number 20    Number of Visits 24    Date for OT Re-Evaluation 02/25/24    OT Start Time 1031    OT Stop Time 1130    OT Time Calculation (min) 59 min    Activity Tolerance Patient tolerated treatment well    Behavior During Therapy [facility] for tasks assessed/performed           Past Medical History:  Diagnosis Date   Allergy    seasonal   Arthritis    left knee   Cancer (HCC)    skin cancer   Cataract    Hyperlipidemia    Hypertension    Past Surgical History:  Procedure Laterality Date   BRAIN SURGERY  1971   COLONOSCOPY  05/03/2021   ELEVATION OF DEPRESSED SKULL FRACTURE  07/16/1969   traumatic blow to head by golf club   , [facility])   MOHS SURGERY  09/14/2011   left temple, squamous   ROBOT ASSISTED LAPAROSCOPIC NEPHRECTOMY Right 04/20/2016   Procedure: XI ROBOTIC ASSISTED LAPAROSCOPIC RADICAL NEPHRECTOMY;  Surgeon: Ricardo Likens, MD;  Location: [facility];  Service: Urology;  Laterality: Right;   Patient Active Problem List   Diagnosis Date Noted   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 12/26/2023   Physical deconditioning 12/26/2023   Tubular adenoma of colon 05/29/2023   Hearing loss due to cerumen impaction, right 05/29/2023   AC (acromioclavicular) arthritis 02/27/2023   Increased prostate specific antigen (PSA) velocity 07/03/2022   Lipoma of right shoulder 05/21/2022   Low back pain 09/15/2021   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   Aortic atherosclerosis (HCC) 04/18/2021   Melanoma in situ of right upper arm (HCC) 04/18/2021   H/O right nephrectomy 04/17/2020   Degenerative arthritis of left knee 02/25/2018   Chronic pain of left knee 02/08/2018   CKD (chronic kidney  disease) stage 3, GFR 30-59 ml/min (HCC) 02/08/2018   Coronary atherosclerosis due to lipid rich plaque 05/26/2016   History of renal cell carcinoma 04/20/2016   B12 deficiency 04/08/2016   Encounter for preventive health examination 06/24/2014   Hypertension 06/23/2014   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 Madie.  He explained a lot of looked a lot.  Comes down to maybe wait 3 months and see what is going on towards the hand.  This possible surgery for nerve transplant.  I just feel like they have not seen me the first month or 2 after surgery to see how much I progressed.  They just see where them now.  Still increased sensation in my hand I can weight-bear on my hand.  They hand just stay stiff.  Pt accompanied by: self  PERTINENT HISTORY: Patient had on 10/08/2023 by Dr. 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  Mostly at At medial elbow and wrist and hand. PRECAUTIONS: Patient to follow surgeon's precautions    WEIGHT BEARING RESTRICTIONS: no  PAIN:  Are you having pain?5 /10 nerve pain mostly in the right ulnar hand  FALLS: Has patient fallen in last 6 months? No  LIVING ENVIRONMENT: Lives with: lives with their spouse  PLOF: Patient prior to surgery had normal active range of motion and strength.  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  Working and teaching yoga and doing things around the house  NEXT MD VISIT:?  OBJECTIVE:  Note: Objective measures were completed at Evaluation unless otherwise noted.  HAND DOMINANCE: Right  ADLs: Patient mostly limited by wrist and hand weakness.  Unable to grip objects and pick it up with weight as well as fine motor unable to pick up small objects.    UPPER EXTREMITY ROM at eval     Patient with decreased proprioception and coordination in right upper extremity range of motion.  Slow and deliberate. Pain and tenderness over right cubital tunnel Increased pain and tenderness over Guyon's canal As well as tenderness over carpal tunnel at volar wrist Bruising present over dorsal wrist. Patient reports he had some IVs in dorsal R wrist  And had a lot of swelling in R hand and wrist postop composite nerve glide and shoulder abduction had increased pain at stage 4 of 5.  Pain from elbow into wrist and hand.    Active ROM Right eval Left eval  Shoulder flexion    Shoulder abduction    Shoulder adduction    Shoulder extension    Shoulder internal rotation    Shoulder external rotation    Elbow flexion    Elbow extension    Wrist flexion 84   Wrist extension 64   Wrist ulnar deviation 20   Wrist radial deviation 25   Wrist pronation 90   Wrist supination 90      At eval  Extension or tapping of digits on right hand of table needs passive range of motion and placed on hold for second digit.  3rd through 5th 3 -/5 strength Lumbrical fist decreased PIP extension Intrinsic a fist within functional limits 3rd through 5th decrease in second digit Decrease strength for dorsal and volar interossei Composite fist three-quarter range Opening of digits limited at PIP extension after composite fist.  Active ROM Right eval Left eval R 10/31/23  R 11/05/23 R/12/03/23 R 12/17/23 Place and hold  R 01/16/24  Thumb MCP (0-60) 35      48/ L 50  Thumb IP (0-80) 10      30 / L 70  Thumb Radial abd/add (0-55) WFL     WFL  55 including EPB  Thumb Palmar abd/add (0-45) impaired     Place and hold 45  Able to do AAROM over ball   Thumb Opposition to Small Finger Unable to do any opposition even to second digit   Opposed to middle phalanges of second digit Pick up 2 cm foam block to 3rd      Index MCP (0-90)    75 80 80 80   Index PIP (0-100)  35 35 60 70   Index DIP (0-70)           Long MCP (0-90)     75 80 80 80   Long PIP (0-100)     60 65 65 70   Long DIP (0-70)           Ring MCP (0-90)     80 85 85 85   Ring PIP (0-100)     75 85 85 85   Ring DIP (0-70)           Little MCP (0-90)     80 90 90 90   Little PIP (0-100)     75 90 90 90   Little DIP (0-70)           (Blank rows = not tested)  HAND FUNCTION: Not tested evaluation  11/05/23 Grip strength: Right: 0 lbs; Left: 76 lbs, Lateral pinch: Right: 0 lbs, Left:   lbs, and 3 point pinch: Right: 0 lbs, Left:   lbs 01/31/24 Grip strength: Right: 0 lbs; Left: 76 lbs, Lateral pinch: Right: 0 lbs, Left:   lbs, and 3 point pinch: Right: 0 lbs, Left:   lbs  COORDINATION: Impaired unable to make composite fist unable to do 2. Or 3 point pinch  SENSATION: Numbness in ulnar forearm into hand and fifth digit and ulnar side of fourth Report some pins-and-needles in DIP of third Patient with increased pain and tenderness over cubital tunnel as well as Guyon's canal.  Tenderness over volar wrist carpal tunnel  12/05/23: [place] Speed was done.  Patient feel only deep pressure on ulnar side of distal upper arm elbow forearm and hand. Also not able to fill hot and cold. 12/20/23 Semmes-Weinstein this date on ulnar upper arm to mid forearm 4.56  consistent and delayed but able to identify 4.31  01/31/24 Semmes-Weinstein this date on ulnar upper arm to mid forearm 4.56  consistent and delayed but  able to identify 4.31   EDEMA: Patient report had severe edema postop in right hand and wrist.  Continue to have some bruising over her dorsal wrist  COGNITION: Overall cognitive status: WNL      TREATMENT DATE: 01/31/24   Patient seen specialist at [facility].  Next follow-up in 3 months.  Surgeon to discuss possible nerve transplant if needed.  Patient continues reports of increased nerve pain in the palm and digits. Patient arrive with increased sensation in all digits this date.  Positive Tinel at the distal palmar crease on ulnar side of hand Increase sensation in the 4th and 5th digits Able to feel all digits  Patient with increased edema in the hand today and stiffness tightness.  Encouraged patient to continue to use Isotoner glove Discussed also the importance of doing in the morning contrast or heat with passive range of motion and prolonged flexion stretch for the hand to be able to grabs large objects.  And to be able to functionally use right hand.  Discussed progress from start of care this date as well as nerve healing as well as results after visit with specialist at [facility].  Answered questions. Patient continues to have some postop swelling on the right thoracic. Patient to wear light compression shirt provided patient with comprex foam to facilitate lymphatic flow horizontal from right AAA and PAA to the left Nighttime and also during the day at home. Can also do some light MLD from her right AI to inguinal lymph nodes as well as right  AAA to the left 20 reps each 2 times a day   Patient to continue with contrast at home Done This date  Contrast 8 minutes withh Ace wrap digits in into flexion  2 minutes in the heat  x 3 unwrapped to extension inbetween patient can do the same at home using a Tubigrip D to pull digits into flexion  BUT NEED TO DO IN AM TO facilitate increased functional use at home during the day And some lymphatic massage prior to PROM   lumbrical fist  with PIP extension  Done 12-15 reps by OT  Followed by passive range of motion to DIP flexion  / PIP flexion into intrinsic fist 12 reps Followed by composite flexion passive range of motion to palm. Able to do 5th and 4th to palm.  2nd and 3rd partial three quarters range  End of session patient able to grasp tennis ball, 2 inch box, as well as a lacrosse ball.  With 3rd through 5th Patient was able to do opposition using partial palmar abduction of the thumb picking up 3 cm foam block with opposition to 2nd and 3rd into lateral 4th and 5th.  Patient has a PT friend that can help him with above hand exercises.  Written instructions provided Patient  able to facilitate palmar abduction sliding over lacrosse ball or baseball. Patient able to show active range of motion three quarters active assist to finish. 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 patient weight-bear through elbow on pillow with applying through the knee able to do scapular retraction with elbow flexion at 90 alternating can add to home exercises 12 reps Prayer stretch 1 minute Plank through elbows and knee can do thoracic straight through and horizontal abduction alternating 12 reps Patient can do floor activities through the elbows with cushioning and do wall activities through the palm After doing wrist extension stretches                                                                                                                       PATIENT EDUCATION: Education details: Progressing changes in  HEP  Person educated:  Patient Education method: Explanation, Demonstration, Tactile cues, Verbal cues, and Handouts Education comprehension: verbalized understanding, returned demonstration, verbal cues required, and needs further education    GOALS: Goals reviewed with patient? Yes  LONG TERM GOALS: Target date: 12 wks   Patient to be independent in home program to modify right upper extremity in ADLs and IADLs as well as positioning during the day and night to decrease irritation on cubital tunnel and volar wrist with decreased pain Baseline:  Pain over the cubital tunnel and Guyon's canal 6-7/10.  With increased tenderness and pain increased numbness over her ulnar forearm wrist and hand into fifth and ulnar side of fourth digit Goal status: Met  2.  Patient to be independent in home program to increase proprioception and ease  of right shoulder, elbow range of motion to normal reaching pattern  Baseline: Patient showed decreased coordination and proprioception in the reaching with the right upper extremity compensating with trunk and scapula Goal status: Met  3.  Right wrist and forearm strength increased to 4+ to initiate strengthening and carrying groceries less than 4 pounds within no increase symptoms Baseline: Active range of motion within functional limits for supination pronation  but decreased wrist extension/ flexion decreased strength in ulnar deviation Goal status: Progressing  4.  Digit active range of motion increased for patient to make composite fist and open hand to grasp around toothbrush and release, put hand in pocket Baseline: Three-quarter of a fist.  Decreased PIP extension with a lumbrical fist.  Decreased intrinsic first and second digit unable to touch any fingertips with opposition.  Numbness on ulnar side of forearm into the wrist hand fifth digit and ulnar fourth Goal status: Progressing  5.  Patient to be independent home program to decrease pain and tenderness from elbow to hand  to less than 2/10 Baseline: Pain 6-7/10 at cubital tunnel and volar wrist and Ganz canal Goal status progressing  6.  Thumb active range of motion improved to within functional limits to be able to do palmar abduction to grasp around 4 cm object as well as increased opposition to pick up medication Baseline: Patient lacking 1 to 2 cm for opposition to second digit.  Unable to do opposition to any digits.  Decreased palmar abduction.  Radial abduction 45 degrees Goal status: Progressing    ASSESSMENT:  CLINICAL IMPRESSION: Patient patient followed by OT for increased weakness, increased pain and numbness in right dominant upper extremity since after surgery.  Patient had on 10/08/2023 a R VATS nerve sheath tumor excision -and hx of nephrectomy .  Patient present with increased pain and tenderness over right cubital tunnel as well as Guyon canal and carpal tunnel Patient with decreased coordination and proprioception and range of motion of right shoulder and elbow in ADLs and IADLs.  Patient with decreased range of motion and strength in her right wrist ,digits and thumb.  Most weakness stiffness in thumb palmar abduction, digit flexion extension as well as interosseous.  NOW  Patient with increased range of motion and strength in right shoulder , elbow and forearm -and wrist is improving.  Patient able to do modified yoga poses.  Patient is a Marine scientist part-time.  Patient can do weightbearing through the elbow and the knees for modified plank with scapular and shoulder stabilization exercises.  On the wall after passive range of motion to the wrist patient can do weightbearing activities and proximal stability exercises through the palm on the wall.  Patient  with increased sensation in the right upper arm and elbow into the forearm -increase sensation in the digits including 5th and 4th.  Less nerve pain, Tinel or tenderness over the carpal tunnel.  Positive Tinel's at the ulnar nerve at [facility] with  increased sensation in the digits.  Reinforced with patient importance of doing edema control and passive range of motion in the morning to digits and hand to facilitate functional use to be able to progress hand.  Patient is showing  motor return in the hand but has to do passive range of motion and stretches in the morning as well as a few times during the day to facilitate increased functional use.  Patient able to grasp objects with 3rd through 5th digits, was able to do opposition and showing partial palmar abduction of the thumb.  Reviewed with patient home exercises.  Patient has a friend that is a retired PT that can assist.   Needed min to mod assist for home exercises patient limited in functional use of right upper extremity in ADLs and IADLs.  Patient can benefit from skilled OT services to decrease pain and numbness increase motion and strength to return to prior level of function.  PERFORMANCE DEFICITS: in functional skills including ADLs, IADLs, coordination, dexterity, proprioception, sensation, edema, ROM, strength, pain, flexibility, Fine motor control, Gross motor control, decreased knowledge of precautions, and decreased knowledge of use of DME,  IMPAIRMENTS: are limiting patient from ADLs, IADLs, rest and sleep, work, play, leisure, and social participation.   COMORBIDITIES: may have co-morbidities  that affects occupational performance. Patient will benefit from skilled OT to address above impairments and improve overall function.  MODIFICATION OR ASSISTANCE TO COMPLETE EVALUATION: Min-Moderate modification of tasks or assist with assess necessary to complete an evaluation.  OT OCCUPATIONAL PROFILE AND HISTORY: Detailed assessment: Review of records and additional review of physical, cognitive, psychosocial history related to current functional performance.  CLINICAL DECISION MAKING: Moderate - several treatment options, min-mod task modification necessary  REHAB POTENTIAL: Good for  goals  EVALUATION COMPLEXITY: Moderate      PLAN:  OT FREQUENCY: 1-2x/week  OT DURATION: 12 weeks  PLANNED INTERVENTIONS: 97168 OT Re-evaluation, 97535 self care/ADL training, 02889 therapeutic exercise, 97530 therapeutic activity, 97112 neuromuscular re-education, 97140 manual therapy, 97039 fluidotherapy, 97034 contrast bath, 97033 iontophoresis, passive range of motion, patient/family education, and DME and/or AE instructions    CONSULTED AND AGREED WITH PLAN OF CARE: Patient   Ancel Peters, OTR/L,CLT 01/31/2024, 1:47 PM

## 2024-02-03 ENCOUNTER — Ambulatory Visit: Admitting: Occupational Therapy

## 2024-02-03 DIAGNOSIS — M79601 Pain in right arm: Secondary | ICD-10-CM | POA: Diagnosis not present

## 2024-02-03 DIAGNOSIS — Z85831 Personal history of malignant neoplasm of soft tissue: Secondary | ICD-10-CM | POA: Diagnosis not present

## 2024-02-03 DIAGNOSIS — R221 Localized swelling, mass and lump, neck: Secondary | ICD-10-CM | POA: Diagnosis not present

## 2024-02-03 DIAGNOSIS — M25641 Stiffness of right hand, not elsewhere classified: Secondary | ICD-10-CM | POA: Diagnosis not present

## 2024-02-03 DIAGNOSIS — M6281 Muscle weakness (generalized): Secondary | ICD-10-CM

## 2024-02-03 DIAGNOSIS — Z08 Encounter for follow-up examination after completed treatment for malignant neoplasm: Secondary | ICD-10-CM | POA: Diagnosis not present

## 2024-02-03 DIAGNOSIS — M25631 Stiffness of right wrist, not elsewhere classified: Secondary | ICD-10-CM

## 2024-02-03 DIAGNOSIS — C493 Malignant neoplasm of connective and soft tissue of thorax: Secondary | ICD-10-CM | POA: Diagnosis not present

## 2024-02-03 DIAGNOSIS — Z85820 Personal history of malignant melanoma of skin: Secondary | ICD-10-CM | POA: Diagnosis not present

## 2024-02-03 DIAGNOSIS — Z85528 Personal history of other malignant neoplasm of kidney: Secondary | ICD-10-CM | POA: Diagnosis not present

## 2024-02-03 NOTE — Therapy (Signed)
 OUTPATIENT OCCUPATIONAL THERAPY ORTHO TREATMENT  Patient Name: Daniel Lawrence MRN: [id number] DOB:[birth year], 63 y.o., male Today's Date: 02/03/2024  PCP: Dr Marylynn REFERRING PROVIDER:Dr Valli  END OF SESSION:  OT End of Session - 02/03/24 1648     Visit Number 21    Number of Visits 24    Date for OT Re-Evaluation 02/25/24    OT Start Time 1648    OT Stop Time 1745    OT Time Calculation (min) 57 min    Activity Tolerance Patient tolerated treatment well    Behavior During Therapy [facility] for tasks assessed/performed           Past Medical History:  Diagnosis Date   Allergy    seasonal   Arthritis    left knee   Cancer (HCC)    skin cancer   Cataract    Hyperlipidemia    Hypertension    Past Surgical History:  Procedure Laterality Date   BRAIN SURGERY  1971   COLONOSCOPY  05/03/2021   ELEVATION OF DEPRESSED SKULL FRACTURE  07/16/1969   traumatic blow to head by golf club   , [facility])   MOHS SURGERY  09/14/2011   left temple, squamous   ROBOT ASSISTED LAPAROSCOPIC NEPHRECTOMY Right 04/20/2016   Procedure: XI ROBOTIC ASSISTED LAPAROSCOPIC RADICAL NEPHRECTOMY;  Surgeon: Ricardo Likens, MD;  Location: [facility];  Service: Urology;  Laterality: Right;   Patient Active Problem List   Diagnosis Date Noted   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 12/26/2023   Physical deconditioning 12/26/2023   Tubular adenoma of colon 05/29/2023   Hearing loss due to cerumen impaction, right 05/29/2023   AC (acromioclavicular) arthritis 02/27/2023   Increased prostate specific antigen (PSA) velocity 07/03/2022   Lipoma of right shoulder 05/21/2022   Low back pain 09/15/2021   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   Aortic atherosclerosis (HCC) 04/18/2021   Melanoma in situ of right upper arm (HCC) 04/18/2021   H/O right nephrectomy 04/17/2020   Degenerative arthritis of left knee 02/25/2018   Chronic pain of left knee 02/08/2018   CKD (chronic kidney disease)  stage 3, GFR 30-59 ml/min (HCC) 02/08/2018   Coronary atherosclerosis due to lipid rich plaque 05/26/2016   History of renal cell carcinoma 04/20/2016   B12 deficiency 04/08/2016   Encounter for preventive health examination 06/24/2014   Hypertension 06/23/2014   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  She is going to monitor her with CT scans 4 times a year.  Was a good appointment.  I did Saturday and Sunday twice a day my finger exercises.  Trying to keep it loose.  Pt accompanied by: self  PERTINENT HISTORY: Patient had on 10/08/2023 by Dr. 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  Mostly at At medial elbow and wrist and hand. PRECAUTIONS: Patient to follow surgeon's precautions  WEIGHT BEARING RESTRICTIONS: no  PAIN:  Are you having pain?5 /10 nerve pain mostly in the right ulnar hand  FALLS: Has patient fallen in last 6 months? No  LIVING ENVIRONMENT: Lives with: lives with their spouse  PLOF: Patient prior to surgery had normal active range of motion and strength.  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  Working and teaching yoga and doing things around the house  NEXT MD VISIT:?  OBJECTIVE:  Note:  Objective measures were completed at Evaluation unless otherwise noted.  HAND DOMINANCE: Right  ADLs: Patient mostly limited by wrist and hand weakness.  Unable to grip objects and pick it up with weight as well as fine motor unable to pick up small objects.    UPPER EXTREMITY ROM at eval     Patient with decreased proprioception and coordination in right upper extremity range of motion.  Slow and deliberate. Pain and tenderness over right cubital tunnel Increased pain and tenderness over Guyon's canal As well as tenderness over carpal tunnel at volar wrist Bruising present over dorsal wrist. Patient reports he had some IVs in dorsal R wrist  And had a lot of swelling in R hand and wrist postop composite nerve glide and shoulder abduction had increased pain at stage 4 of 5.  Pain from elbow into wrist and hand.    Active ROM Right eval Left eval  Shoulder flexion    Shoulder abduction    Shoulder adduction    Shoulder extension    Shoulder internal rotation    Shoulder external rotation    Elbow flexion    Elbow extension    Wrist flexion 84   Wrist extension 64   Wrist ulnar deviation 20   Wrist radial deviation 25   Wrist pronation 90   Wrist supination 90      At eval  Extension or tapping of digits on right hand of table needs passive range of motion and placed on hold for second digit.  3rd through 5th 3 -/5 strength Lumbrical fist decreased PIP extension Intrinsic a fist within functional limits 3rd through 5th decrease in second digit Decrease strength for dorsal and volar interossei Composite fist three-quarter range Opening of digits limited at PIP extension after composite fist.  Active ROM Right eval Left eval R 10/31/23 R 11/05/23 R/12/03/23 R 12/17/23 Place and hold  R 01/16/24  Thumb MCP (0-60) 35      48/ L 50  Thumb IP (0-80) 10      30 / L 70  Thumb Radial abd/add (0-55) WFL     WFL  55 including EPB  Thumb Palmar abd/add (0-45) impaired     Place  and hold 45  Able to do AAROM over ball   Thumb Opposition to Small Finger Unable to do any opposition even to second digit   Opposed to middle phalanges of second digit Pick up 2 cm foam block to 3rd      Index MCP (0-90)    75 80 80 80   Index PIP (0-100)    35 35 60 70   Index DIP (0-70)           Long MCP (0-90)     75 80 80 80   Long PIP (0-100)     60 65 65 70  Long DIP (0-70)           Ring MCP (0-90)     80 85 85 85   Ring PIP (0-100)     75 85 85 85   Ring DIP (0-70)           Little MCP (0-90)     80 90 90 90   Little PIP (0-100)     75 90 90 90   Little DIP (0-70)           (Blank rows = not tested)  HAND FUNCTION: Not tested evaluation  11/05/23 Grip strength: Right: 0 lbs; Left: 76 lbs, Lateral pinch: Right: 0 lbs, Left:   lbs, and 3 point pinch: Right: 0 lbs, Left:   lbs 02/03/24 Grip strength: Right: NT lbs; Left: 76 lbs, Lateral pinch: Right: 1 lbs, Left:   lbs, and 3 point pinch: Right: unable  lbs, Left:   lbs  COORDINATION: Impaired unable to make composite fist unable to do 2. Or 3 point pinch  SENSATION: Numbness in ulnar forearm into hand and fifth digit and ulnar side of fourth Report some pins-and-needles in DIP of third Patient with increased pain and tenderness over cubital tunnel as well as Guyon's canal.  Tenderness over volar wrist carpal tunnel  12/05/23: [place] Speed was done.  Patient feel only deep pressure on ulnar side of distal upper arm elbow forearm and hand. Also not able to fill hot and cold. 12/20/23 Semmes-Weinstein this date on ulnar upper arm to mid forearm 4.56  consistent and delayed but able to identify 4.31  01/31/24 Semmes-Weinstein this date on ulnar upper arm to mid forearm 4.56  consistent and delayed but able to identify 4.31   EDEMA: Patient report had severe edema postop in right hand and wrist.  Continue to have some bruising over her dorsal wrist  COGNITION: Overall cognitive status: WNL      TREATMENT DATE: 02/03/24    I seen the surgeon was removed tumor.  Was a good appointment. Total have more feeling my whole arm.  And just feel like I am weak.  Patient continues reports of increased nerve pain in the palm and digits. Patient arrive with increased sensation in all digits this date.  Positive Tinel at the distal palmar crease on ulnar side of hand Increase sensation in the 4th and 5th digits Able to feel all digits  Patient with increased edema in the hand today and stiffness tightness.  Encouraged patient to continue to use Isotoner glove Discussed also the importance of doing in the morning contrast or heat with passive range of motion and prolonged flexion stretch for the hand to be able to grabs large objects.  And to be able to functionally use right hand.  Patient continues to have some postop swelling on the right thoracic. Patient to wear light compression shirt provided patient with comprex foam to facilitate lymphatic flow horizontal from right AAA and PAA to the left Nighttime and also during the day at home. Can also do some light MLD from her right AI to inguinal lymph nodes as well as right AAA to the left 20 reps each 2 times a day Reports surgeon told him the same thing about compression.   Patient to continue with contrast at home Done This date heat 8 minutes withh Ace wrap digits in into flexion  2 minutes in the heat  x 3 unwrapped to extension inbetween patient can do the same at home using  a Tubigrip D to pull digits into flexion  BUT NEED TO DO IN AM TO facilitate increased functional use at home during the day And some lymphatic massage prior to PROM   lumbrical fist with PIP extension  Done 12-15 reps by OT  Followed by passive range of motion to DIP flexion followed by PIP flexion into intrinsic fist 12 reps Prolonged stretch into intrinsic assist 1 to 2 minutes all digits separate Followed by composite flexion passive range of motion to palm. Able to get 2nd and 3rd and  5th to palm.   Coban flexion to wrap 2nd and 3rd while working on passive range of motion on fourth of fifth End of session patient able to grasp 2 inch box, as well as a lacrosse ball.  With 3rd through 5th Able to grasp into light blue putty at home exercises Patient was able to do opposition using partial palmar abduction of the thumb picking up 3 cm foam block with opposition to 2nd and 3rd into lateral 4th and 5th. Palmar abduction patient can do against gravity sliding on long object.  Or without gravity placing hold rubber band Patient showed this to a 1 pound of lateral pinch and able to do lateral pinch into light blue putty.  Add home exercises.   Patient  able to facilitate palmar abduction sliding over lacrosse ball or baseball. Patient able to show active range of motion three quarters active assist to finish.          HEP: wall push-ups tolerating well pressure through the palm Weightbearing through the palms with scapular retraction shoulder horizontal abduction alternating left and right while weightbearing through the opposite All 12 reps Patient can add to home exercises   HEP Patient is a yoga instructor Had patient weight-bear through elbow on pillow with applying through the knee able to do scapular retraction with elbow flexion at 90 alternating can add to home exercises 12 reps Prayer stretch 1 minute Plank through elbows and knee can do thoracic straight through and horizontal abduction alternating 12 reps Patient can do floor activities through the elbows with cushioning and do wall activities through the palm After doing wrist extension stretches                                                                                                                       PATIENT EDUCATION: Education details: Progressing changes in  HEP  Person educated: Patient Education method: Explanation, Demonstration, Tactile cues, Verbal cues, and  Handouts Education comprehension: verbalized understanding, returned demonstration, verbal cues required, and needs further education    GOALS: Goals reviewed with patient? Yes  LONG TERM GOALS: Target date: 12 wks   Patient to be independent in home program to modify right upper extremity in ADLs and IADLs as well as positioning during the day and night to decrease irritation on cubital tunnel and volar wrist with decreased pain Baseline: Pain over the cubital tunnel and Guyon's canal 6-7/10.  With increased  tenderness and pain increased numbness over her ulnar forearm wrist and hand into fifth and ulnar side of fourth digit Goal status: Met  2.  Patient to be independent in home program to increase proprioception and ease  of right shoulder, elbow range of motion to normal reaching pattern  Baseline: Patient showed decreased coordination and proprioception in the reaching with the right upper extremity compensating with trunk and scapula Goal status: Met  3.  Right wrist and forearm strength increased to 4+ to initiate strengthening and carrying groceries less than 4 pounds within no increase symptoms Baseline: Active range of motion within functional limits for supination pronation  but decreased wrist extension/ flexion decreased strength in ulnar deviation Goal status: Progressing  4.  Digit active range of motion increased for patient to make composite fist and open hand to grasp around toothbrush and release, put hand in pocket Baseline: Three-quarter of a fist.  Decreased PIP extension with a lumbrical fist.  Decreased intrinsic first and second digit unable to touch any fingertips with opposition.  Numbness on ulnar side of forearm into the wrist hand fifth digit and ulnar fourth Goal status: Progressing  5.  Patient to be independent home program to decrease pain and tenderness from elbow to hand to less than 2/10 Baseline: Pain 6-7/10 at cubital tunnel and volar wrist and [place]  canal Goal status progressing  6.  Thumb active range of motion improved to within functional limits to be able to do palmar abduction to grasp around 4 cm object as well as increased opposition to pick up medication Baseline: Patient lacking 1 to 2 cm for opposition to second digit.  Unable to do opposition to any digits.  Decreased palmar abduction.  Radial abduction 45 degrees Goal status: Progressing    ASSESSMENT:  CLINICAL IMPRESSION: Patient patient followed by OT for increased weakness, increased pain and numbness in right dominant upper extremity since after surgery.  Patient had on 10/08/2023 a R VATS nerve sheath tumor excision -and hx of nephrectomy .  Patient present with increased pain and tenderness over right cubital tunnel as well as Guyon canal and carpal tunnel Patient with decreased coordination and proprioception and range of motion of right shoulder and elbow in ADLs and IADLs.  Patient with decreased range of motion and strength in her right wrist ,digits and thumb.  Most weakness stiffness in thumb palmar abduction, digit flexion extension as well as interosseous.  NOW  Patient with increased range of motion and strength in right shoulder , elbow and forearm -and wrist is improving.  Patient able to do modified yoga poses.  Patient is a Marine scientist part-time.  Patient can do weightbearing through the elbow and the knees for modified plank with scapular and shoulder stabilization exercises.  On the wall after passive range of motion to the wrist patient can do weightbearing activities and proximal stability exercises through the palm on the wall.  Patient  with increased sensation in the right upper arm and elbow into the forearm -increase sensation in the digits including 5th and 4th.  Less nerve pain, Tinel or tenderness over the carpal tunnel.  Positive Tinel's at the ulnar nerve at [facility] with increased sensation in the digits.  Reinforced with patient importance of doing edema  control and passive range of motion in the morning to digits and hand to facilitate functional use to be able to -patient to arrive with less stiffness in the digits this date.  Patient is showing motor return in  the hand but has to do passive range of motion and stretches in the morning as well as a few times during the day to facilitate increased functional use.  Patient able to grasp objects with 3rd through 5th digits, was able to do opposition and showing partial palmar abduction of the thumb.  Reviewed with patient home exercises.  Patient able to initiate palmar abduction against gravity or without gravity with placing hold rubber band.  Patient showed lateral pinch of 1 pound today.  Add light blue putty for strengthening.   Needed min to mod assist for home exercises patient limited in functional use of right upper extremity in ADLs and IADLs.  Patient can benefit from skilled OT services to decrease pain and numbness increase motion and strength to return to prior level of function.  PERFORMANCE DEFICITS: in functional skills including ADLs, IADLs, coordination, dexterity, proprioception, sensation, edema, ROM, strength, pain, flexibility, Fine motor control, Gross motor control, decreased knowledge of precautions, and decreased knowledge of use of DME,  IMPAIRMENTS: are limiting patient from ADLs, IADLs, rest and sleep, work, play, leisure, and social participation.   COMORBIDITIES: may have co-morbidities  that affects occupational performance. Patient will benefit from skilled OT to address above impairments and improve overall function.  MODIFICATION OR ASSISTANCE TO COMPLETE EVALUATION: Min-Moderate modification of tasks or assist with assess necessary to complete an evaluation.  OT OCCUPATIONAL PROFILE AND HISTORY: Detailed assessment: Review of records and additional review of physical, cognitive, psychosocial history related to current functional performance.  CLINICAL DECISION MAKING:  Moderate - several treatment options, min-mod task modification necessary  REHAB POTENTIAL: Good for goals  EVALUATION COMPLEXITY: Moderate      PLAN:  OT FREQUENCY: 1-2x/week  OT DURATION: 12 weeks  PLANNED INTERVENTIONS: 97168 OT Re-evaluation, 97535 self care/ADL training, 02889 therapeutic exercise, 97530 therapeutic activity, 97112 neuromuscular re-education, 97140 manual therapy, 97039 fluidotherapy, 97034 contrast bath, 97033 iontophoresis, passive range of motion, patient/family education, and DME and/or AE instructions    CONSULTED AND AGREED WITH PLAN OF CARE: Patient   Daniel Lawrence, OTR/L,CLT 02/03/2024, 8:02 PM

## 2024-02-06 ENCOUNTER — Ambulatory Visit: Admitting: Occupational Therapy

## 2024-02-06 DIAGNOSIS — M25641 Stiffness of right hand, not elsewhere classified: Secondary | ICD-10-CM | POA: Diagnosis not present

## 2024-02-06 DIAGNOSIS — M25631 Stiffness of right wrist, not elsewhere classified: Secondary | ICD-10-CM

## 2024-02-06 DIAGNOSIS — M79601 Pain in right arm: Secondary | ICD-10-CM | POA: Diagnosis not present

## 2024-02-06 DIAGNOSIS — M6281 Muscle weakness (generalized): Secondary | ICD-10-CM

## 2024-02-06 NOTE — Therapy (Signed)
 OUTPATIENT OCCUPATIONAL THERAPY ORTHO TREATMENT  Patient Name: Daniel Lawrence MRN: [id number] DOB:03-13-1961, 63 y.o., male Today's Date: 02/06/2024  PCP: Dr Marylynn REFERRING PROVIDER:Dr Valli  END OF SESSION:  OT End of Session - 02/06/24 1821     Visit Number 22    Number of Visits 24    Date for OT Re-Evaluation 02/25/24    OT Start Time 1455    OT Stop Time 1550    OT Time Calculation (min) 55 min    Activity Tolerance Patient tolerated treatment well    Behavior During Therapy [facility] for tasks assessed/performed           Past Medical History:  Diagnosis Date   Allergy    seasonal   Arthritis    left knee   Cancer (HCC)    skin cancer   Cataract    Hyperlipidemia    Hypertension    Past Surgical History:  Procedure Laterality Date   BRAIN SURGERY  1971   COLONOSCOPY  05/03/2021   ELEVATION OF DEPRESSED SKULL FRACTURE  07/16/1969   traumatic blow to head by golf club   , [facility])   MOHS SURGERY  09/14/2011   left temple, squamous   ROBOT ASSISTED LAPAROSCOPIC NEPHRECTOMY Right 04/20/2016   Procedure: XI ROBOTIC ASSISTED LAPAROSCOPIC RADICAL NEPHRECTOMY;  Surgeon: Ricardo Likens, MD;  Location: [facility];  Service: Urology;  Laterality: Right;   Patient Active Problem List   Diagnosis Date Noted   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 12/26/2023   Physical deconditioning 12/26/2023   Tubular adenoma of colon 05/29/2023   Hearing loss due to cerumen impaction, right 05/29/2023   AC (acromioclavicular) arthritis 02/27/2023   Increased prostate specific antigen (PSA) velocity 07/03/2022   Lipoma of right shoulder 05/21/2022   Low back pain 09/15/2021   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   Aortic atherosclerosis (HCC) 04/18/2021   Melanoma in situ of right upper arm (HCC) 04/18/2021   H/O right nephrectomy 04/17/2020   Degenerative arthritis of left knee 02/25/2018   Chronic pain of left knee 02/08/2018   CKD (chronic kidney disease)  stage 3, GFR 30-59 ml/min (HCC) 02/08/2018   Coronary atherosclerosis due to lipid rich plaque 05/26/2016   History of renal cell carcinoma 04/20/2016   B12 deficiency 04/08/2016   Encounter for preventive health examination 06/24/2014   Hypertension 06/23/2014   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  I could feel cold.  In different textures.  And is easier to put on a glove.  Pt accompanied by: self  PERTINENT HISTORY: Patient had on 10/08/2023 by Dr. 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  Mostly at At medial elbow and wrist and hand. PRECAUTIONS: Patient to follow surgeon's precautions    WEIGHT BEARING RESTRICTIONS: no  PAIN:  Are you having pain?5 /10 nerve pain mostly in the  right ulnar hand  FALLS: Has patient fallen in last 6 months? No  LIVING ENVIRONMENT: Lives with: lives with their spouse  PLOF: Patient prior to surgery had normal active range of motion and strength.  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  Working and teaching yoga and doing things around the house  NEXT MD VISIT:?  OBJECTIVE:  Note: Objective measures were completed at Evaluation unless otherwise noted.  HAND  DOMINANCE: Right  ADLs: Patient mostly limited by wrist and hand weakness.  Unable to grip objects and pick it up with weight as well as fine motor unable to pick up small objects.    UPPER EXTREMITY ROM at eval     Patient with decreased proprioception and coordination in right upper extremity range of motion.  Slow and deliberate. Pain and tenderness over right cubital tunnel Increased pain and tenderness over Guyon's canal As well as tenderness over carpal tunnel at volar wrist Bruising present over dorsal wrist. Patient reports he had some IVs in dorsal R wrist  And had a lot of swelling in R hand and wrist postop composite nerve glide and shoulder abduction had increased pain at stage 4 of 5.  Pain from elbow into wrist and hand.    Active ROM Right eval Left eval  Shoulder flexion    Shoulder abduction    Shoulder adduction    Shoulder extension    Shoulder internal rotation    Shoulder external rotation    Elbow flexion    Elbow extension    Wrist flexion 84   Wrist extension 64   Wrist ulnar deviation 20   Wrist radial deviation 25   Wrist pronation 90   Wrist supination 90      At eval  Extension or tapping of digits on right hand of table needs passive range of motion and placed on hold for second digit.  3rd through 5th 3 -/5 strength Lumbrical fist decreased PIP extension Intrinsic a fist within functional limits 3rd through 5th decrease in second digit Decrease strength for dorsal and volar interossei Composite fist three-quarter range Opening of digits limited at PIP extension after composite fist.  Active ROM Right eval Left eval R 10/31/23 R 11/05/23 R/12/03/23 R 12/17/23 Place and hold  R 01/16/24  Thumb MCP (0-60) 35      48/ L 50  Thumb IP (0-80) 10      30 / L 70  Thumb Radial abd/add (0-55) WFL     WFL  55 including EPB  Thumb Palmar abd/add (0-45) impaired     Place and hold 45  Able to do AAROM over ball   Thumb Opposition to Small Finger  Unable to do any opposition even to second digit   Opposed to middle phalanges of second digit Pick up 2 cm foam block to 3rd      Index MCP (0-90)    75 80 80 80   Index PIP (0-100)    35 35 60 70   Index DIP (0-70)           Long MCP (0-90)     75 80 80 80   Long PIP (0-100)     60 65 65 70   Long DIP (0-70)           Ring MCP (0-90)  80 85 85 85   Ring PIP (0-100)     75 85 85 85   Ring DIP (0-70)           Little MCP (0-90)     80 90 90 90   Little PIP (0-100)     75 90 90 90   Little DIP (0-70)           (Blank rows = not tested)  HAND FUNCTION: Not tested evaluation  11/05/23 Grip strength: Right: 0 lbs; Left: 76 lbs, Lateral pinch: Right: 0 lbs, Left:   lbs, and 3 point pinch: Right: 0 lbs, Left:   lbs 02/03/24 Grip strength: Right: NT lbs; Left: 76 lbs, Lateral pinch: Right: 1 lbs, Left:   lbs, and 3 point pinch: Right: unable  lbs, Left:   lbs  COORDINATION: Impaired unable to make composite fist unable to do 2. Or 3 point pinch  SENSATION: Numbness in ulnar forearm into hand and fifth digit and ulnar side of fourth Report some pins-and-needles in DIP of third Patient with increased pain and tenderness over cubital tunnel as well as Guyon's canal.  Tenderness over volar wrist carpal tunnel  12/05/23: [place] Speed was done.  Patient feel only deep pressure on ulnar side of distal upper arm elbow forearm and hand. Also not able to fill hot and cold. 12/20/23 Semmes-Weinstein this date on ulnar upper arm to mid forearm 4.56  consistent and delayed but able to identify 4.31  01/31/24 Semmes-Weinstein this date on ulnar upper arm to mid forearm 4.56  consistent and delayed but able to identify 4.31   EDEMA: Patient report had severe edema postop in right hand and wrist.  Continue to have some bruising over her dorsal wrist  COGNITION: Overall cognitive status: WNL      TREATMENT DATE: 02/06/24     Patient continues reports of increased nerve pain in the palm and  digits. Patient arrive with increased sensation Increase sensation on the fifth digit.  Able to tell hot and cold.  Easier to put on a glove and feel textures.   Encouraged patient to continue to use Isotoner glove Discussed also the importance of doing in the morning contrast or heat and range of motion to provide functional hand for patient to be able to grasp  Patient to continue with home program for thoracic swelling patient continues to have some postop swelling on the right thoracic. Patient to wear light compression shirt provided patient with comprex foam to facilitate lymphatic flow horizontal from right AAA and PAA to the left Nighttime and also during the day at home. Can also do some light MLD from her right AI to inguinal lymph nodes as well as right AAA to the left 20 reps each 2 times a day Reports surgeon told him the same thing about compression.   Patient to continue with contrast at home This date for the patient with a knuckle bender MC flexion splint to use after contrast to increase MC flexion.  4 minutes.  Fabricated flexion glove for a dynamic flexion stretch also after knuckle bender splint 4 minutes.  Followed by combination use of MCP flexion dynamic splint and flexion glove to facilitate composite flexion.  Patient's flexion in splint 70 to 75 degrees at MCPs and 75 to 80 degrees at PIPs.   BUT NEED TO DO IN AM TO facilitate increased functional use at home during the day As well as some during the day  After splint use and  flexion glove facilitated patient to grasp 2 inch box, as well as a lacrosse ball.   Patient able to balance and catch in palm.   Patient to facilitate and placing whole thumb palmar abduction around lacrosse ball prior to balance  or throw.  Patient was able to sign language with the right hand prior to injury. Encourage patient to attempt as much as he can to do that as well as copying the left hand as well as attempts to place and hold  different signs different positions.  Patient  able to facilitate palmar abduction sliding over lacrosse ball or baseball. Patient able to show active range of motion three quarters active assist to finish.          HEP: wall push-ups tolerating well pressure through the palm Weightbearing through the palms with scapular retraction shoulder horizontal abduction alternating left and right while weightbearing through the opposite All 12 reps Patient can add to home exercises   HEP Patient is a yoga instructor Had patient weight-bear through elbow on pillow with applying through the knee able to do scapular retraction with elbow flexion at 90 alternating can add to home exercises 12 reps Prayer stretch 1 minute Plank through elbows and knee can do thoracic straight through and horizontal abduction alternating 12 reps Patient can do floor activities through the elbows with cushioning and do wall activities through the palm After doing wrist extension stretches                                                                                                                       PATIENT EDUCATION: Education details: Progressing changes in  HEP  Person educated: Patient Education method: Explanation, Demonstration, Tactile cues, Verbal cues, and Handouts Education comprehension: verbalized understanding, returned demonstration, verbal cues required, and needs further education    GOALS: Goals reviewed with patient? Yes  LONG TERM GOALS: Target date: 12 wks   Patient to be independent in home program to modify right upper extremity in ADLs and IADLs as well as positioning during the day and night to decrease irritation on cubital tunnel and volar wrist with decreased pain Baseline: Pain over the cubital tunnel and Guyon's canal 6-7/10.  With increased tenderness and pain increased numbness over her ulnar forearm wrist and hand into fifth and ulnar side of fourth  digit Goal status: Met  2.  Patient to be independent in home program to increase proprioception and ease  of right shoulder, elbow range of motion to normal reaching pattern  Baseline: Patient showed decreased coordination and proprioception in the reaching with the right upper extremity compensating with trunk and scapula Goal status: Met  3.  Right wrist and forearm strength increased to 4+ to initiate strengthening and carrying groceries less than 4 pounds within no increase symptoms Baseline: Active range of motion within functional limits for supination pronation  but decreased wrist extension/ flexion decreased strength in ulnar deviation Goal status: Progressing  4.  Digit active range of  motion increased for patient to make composite fist and open hand to grasp around toothbrush and release, put hand in pocket Baseline: Three-quarter of a fist.  Decreased PIP extension with a lumbrical fist.  Decreased intrinsic first and second digit unable to touch any fingertips with opposition.  Numbness on ulnar side of forearm into the wrist hand fifth digit and ulnar fourth Goal status: Progressing  5.  Patient to be independent home program to decrease pain and tenderness from elbow to hand to less than 2/10 Baseline: Pain 6-7/10 at cubital tunnel and volar wrist and Ganz canal Goal status progressing  6.  Thumb active range of motion improved to within functional limits to be able to do palmar abduction to grasp around 4 cm object as well as increased opposition to pick up medication Baseline: Patient lacking 1 to 2 cm for opposition to second digit.  Unable to do opposition to any digits.  Decreased palmar abduction.  Radial abduction 45 degrees Goal status: Progressing    ASSESSMENT:  CLINICAL IMPRESSION: Patient patient followed by OT for increased weakness, increased pain and numbness in right dominant upper extremity since after surgery.  Patient had on 10/08/2023 a R VATS nerve  sheath tumor excision -and hx of nephrectomy .  Patient present with increased pain and tenderness over right cubital tunnel as well as Guyon canal and carpal tunnel Patient with decreased coordination and proprioception and range of motion of right shoulder and elbow in ADLs and IADLs.  Patient with decreased range of motion and strength in her right wrist ,digits and thumb.  Most weakness stiffness in thumb palmar abduction, digit flexion extension as well as interosseous.  NOW  Patient with increased range of motion and strength in right shoulder , elbow and forearm -and wrist is improving.  Patient able to do modified yoga poses.  Patient is a Marine scientist part-time.  Patient can do weightbearing through the elbow and the knees for modified plank with scapular and shoulder stabilization exercises.  On the wall after passive range of motion to the wrist patient can do weightbearing activities and proximal stability exercises through the palm on the wall.  Patient  with increased sensation in the right upper arm and elbow into the forearm -increase sensation in the digits including 5th and 4th.  Less nerve pain, Tinel or tenderness over the carpal tunnel.  Positive Tinel's at the ulnar nerve at [facility] with increased sensation in the digits.  Reinforced with patient importance of doing edema control and and fitted patient with MCP dynamic flexion splint as well as fabricated flexion glove for patient to use to facilitate increased MP flexion, PIP flexion as well as composite flexion in the mornings especially but also during the day to facilitate functional use-  Patient is showing motor return in the hand but has to do passive range of motion and stretches in the morning as well as a few times during the day to facilitate increased functional use.  Patient able to grasp objects with 3rd through 5th digits, was able to do opposition and showing partial palmar abduction of the thumb.  Reviewed with patient home  exercises.  Patient able to initiate palmar abduction against gravity or without gravity with placing hold rubber band.  Patient showed lateral pinch of 1 pound today.  Add light blue putty for strengthening.   Needed min to mod assist for home exercises patient limited in functional use of right upper extremity in ADLs and IADLs.  Patient can  benefit from skilled OT services to decrease pain and numbness increase motion and strength to return to prior level of function.  PERFORMANCE DEFICITS: in functional skills including ADLs, IADLs, coordination, dexterity, proprioception, sensation, edema, ROM, strength, pain, flexibility, Fine motor control, Gross motor control, decreased knowledge of precautions, and decreased knowledge of use of DME,  IMPAIRMENTS: are limiting patient from ADLs, IADLs, rest and sleep, work, play, leisure, and social participation.   COMORBIDITIES: may have co-morbidities  that affects occupational performance. Patient will benefit from skilled OT to address above impairments and improve overall function.  MODIFICATION OR ASSISTANCE TO COMPLETE EVALUATION: Min-Moderate modification of tasks or assist with assess necessary to complete an evaluation.  OT OCCUPATIONAL PROFILE AND HISTORY: Detailed assessment: Review of records and additional review of physical, cognitive, psychosocial history related to current functional performance.  CLINICAL DECISION MAKING: Moderate - several treatment options, min-mod task modification necessary  REHAB POTENTIAL: Good for goals  EVALUATION COMPLEXITY: Moderate      PLAN:  OT FREQUENCY: 1-2x/week  OT DURATION: 12 weeks  PLANNED INTERVENTIONS: 97168 OT Re-evaluation, 97535 self care/ADL training, 02889 therapeutic exercise, 97530 therapeutic activity, 97112 neuromuscular re-education, 97140 manual therapy, 97039 fluidotherapy, 97034 contrast bath, 97033 iontophoresis, passive range of motion, patient/family education, and DME and/or  AE instructions    CONSULTED AND AGREED WITH PLAN OF CARE: Patient   Ancel Peters, OTR/L,CLT 02/06/2024, 6:23 PM

## 2024-02-10 ENCOUNTER — Ambulatory Visit: Admitting: Occupational Therapy

## 2024-02-10 DIAGNOSIS — M79601 Pain in right arm: Secondary | ICD-10-CM | POA: Diagnosis not present

## 2024-02-10 DIAGNOSIS — M6281 Muscle weakness (generalized): Secondary | ICD-10-CM | POA: Diagnosis not present

## 2024-02-10 DIAGNOSIS — M25631 Stiffness of right wrist, not elsewhere classified: Secondary | ICD-10-CM | POA: Diagnosis not present

## 2024-02-10 DIAGNOSIS — M25641 Stiffness of right hand, not elsewhere classified: Secondary | ICD-10-CM

## 2024-02-10 NOTE — Progress Notes (Unsigned)
   LILLETTE Ileana Collet, PhD, LAT, ATC acting as a scribe for Artist Lloyd, MD.  Daniel Lawrence is a 63 y.o. male who presents to [facility] today for elbow pain. Pt was previously seen by Dr. Claudene on 07/03/23 for OMT.  Today, pt c/o R***L elbow pain x ***. Pt locates pain to ***  Swelling: Treatments tried:  Pertinent review of systems: ***  Relevant historical information: ***   Exam:  There were no vitals taken for this visit. General: Well Developed, well nourished, and in no acute distress.   MSK: ***    Lab and Radiology Results No results found for this or any previous visit (from the past 72 hours). No results found.     Assessment and Plan: 63 y.o. male with ***   PDMP not reviewed this encounter. No orders of the defined types were placed in this encounter.  No orders of the defined types were placed in this encounter.    Discussed warning signs or symptoms. Please see discharge instructions. Patient expresses understanding.   ***

## 2024-02-10 NOTE — Therapy (Signed)
 OUTPATIENT OCCUPATIONAL THERAPY ORTHO TREATMENT  Patient Name: Daniel Lawrence MRN: [id number] DOB:[birth year], 63 y.o., male Today's Date: 02/10/2024  PCP: Dr Marylynn REFERRING PROVIDER:Dr Valli  END OF SESSION:  OT End of Session - 02/10/24 1606     Visit Number 23    Number of Visits 24    Date for OT Re-Evaluation 02/25/24    OT Start Time 1602    Activity Tolerance Patient tolerated treatment well    Behavior During Therapy [facility] for tasks assessed/performed           Past Medical History:  Diagnosis Date   Allergy    seasonal   Arthritis    left knee   Cancer (HCC)    skin cancer   Cataract    Hyperlipidemia    Hypertension    Past Surgical History:  Procedure Laterality Date   BRAIN SURGERY  1971   COLONOSCOPY  05/03/2021   ELEVATION OF DEPRESSED SKULL FRACTURE  07/16/1969   traumatic blow to head by golf club   , [facility])   MOHS SURGERY  09/14/2011   left temple, squamous   ROBOT ASSISTED LAPAROSCOPIC NEPHRECTOMY Right 04/20/2016   Procedure: XI ROBOTIC ASSISTED LAPAROSCOPIC RADICAL NEPHRECTOMY;  Surgeon: Ricardo Likens, MD;  Location: [facility];  Service: Urology;  Laterality: Right;   Patient Active Problem List   Diagnosis Date Noted   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 12/26/2023   Physical deconditioning 12/26/2023   Tubular adenoma of colon 05/29/2023   Hearing loss due to cerumen impaction, right 05/29/2023   AC (acromioclavicular) arthritis 02/27/2023   Increased prostate specific antigen (PSA) velocity 07/03/2022   Lipoma of right shoulder 05/21/2022   Low back pain 09/15/2021   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   Aortic atherosclerosis (HCC) 04/18/2021   Melanoma in situ of right upper arm (HCC) 04/18/2021   H/O right nephrectomy 04/17/2020   Degenerative arthritis of left knee 02/25/2018   Chronic pain of left knee 02/08/2018   CKD (chronic kidney disease) stage 3, GFR 30-59 ml/min (HCC) 02/08/2018   Coronary  atherosclerosis due to lipid rich plaque 05/26/2016   History of renal cell carcinoma 04/20/2016   B12 deficiency 04/08/2016   Encounter for preventive health examination 06/24/2014   Hypertension 06/23/2014   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  Use it about twice a day since have seen you last.  Used it about an hour or 2 before I came.  Continues to feel some more nerve pain in my hand.  Pt accompanied by: self  PERTINENT HISTORY: Patient had on 10/08/2023 by Dr. 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  Mostly at At medial elbow and wrist and hand. PRECAUTIONS: Patient to follow surgeon's precautions    WEIGHT BEARING RESTRICTIONS: no  PAIN:  Are you having pain?5 /10 nerve  pain mostly in the right ulnar hand  FALLS: Has patient fallen in last 6 months? No  LIVING ENVIRONMENT: Lives with: lives with their spouse  PLOF: Patient prior to surgery had normal active range of motion and strength.  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  Working and teaching yoga and doing things around the house  NEXT MD VISIT:?  OBJECTIVE:  Note: Objective measures were completed at Evaluation unless  otherwise noted.  HAND DOMINANCE: Right  ADLs: Patient mostly limited by wrist and hand weakness.  Unable to grip objects and pick it up with weight as well as fine motor unable to pick up small objects.    UPPER EXTREMITY ROM at eval     Patient with decreased proprioception and coordination in right upper extremity range of motion.  Slow and deliberate. Pain and tenderness over right cubital tunnel Increased pain and tenderness over Guyon's canal As well as tenderness over carpal tunnel at volar wrist Bruising present over dorsal wrist. Patient reports he had some IVs in dorsal R wrist  And had a lot of swelling in R hand and wrist postop composite nerve glide and shoulder abduction had increased pain at stage 4 of 5.  Pain from elbow into wrist and hand.    Active ROM Right eval Left eval  Shoulder flexion    Shoulder abduction    Shoulder adduction    Shoulder extension    Shoulder internal rotation    Shoulder external rotation    Elbow flexion    Elbow extension    Wrist flexion 84   Wrist extension 64   Wrist ulnar deviation 20   Wrist radial deviation 25   Wrist pronation 90   Wrist supination 90      At eval  Extension or tapping of digits on right hand of table needs passive range of motion and placed on hold for second digit.  3rd through 5th 3 -/5 strength Lumbrical fist decreased PIP extension Intrinsic a fist within functional limits 3rd through 5th decrease in second digit Decrease strength for dorsal and volar interossei Composite fist three-quarter range Opening of digits limited at PIP extension after composite fist.  Active ROM Right eval Left eval R 10/31/23 R 11/05/23 R/12/03/23 R 12/17/23 Place and hold  R 01/16/24  Thumb MCP (0-60) 35      48/ L 50  Thumb IP (0-80) 10      30 / L 70  Thumb Radial abd/add (0-55) WFL     WFL  55 including EPB  Thumb Palmar abd/add (0-45) impaired     Place and hold 45  Able to do AAROM over ball   Thumb  Opposition to Small Finger Unable to do any opposition even to second digit   Opposed to middle phalanges of second digit Pick up 2 cm foam block to 3rd      Index MCP (0-90)    75 80 80 80   Index PIP (0-100)    35 35 60 70   Index DIP (0-70)           Long MCP (0-90)     75 80 80 80   Long PIP (0-100)     60 65 65 70   Long DIP (0-70)  Ring MCP (0-90)     80 85 85 85   Ring PIP (0-100)     75 85 85 85   Ring DIP (0-70)           Little MCP (0-90)     80 90 90 90   Little PIP (0-100)     75 90 90 90   Little DIP (0-70)           (Blank rows = not tested)  HAND FUNCTION: Not tested evaluation  11/05/23 Grip strength: Right: 0 lbs; Left: 76 lbs, Lateral pinch: Right: 0 lbs, Left:   lbs, and 3 point pinch: Right: 0 lbs, Left:   lbs 02/03/24 Grip strength: Right: NT lbs; Left: 76 lbs, Lateral pinch: Right: 1 lbs, Left:   lbs, and 3 point pinch: Right: unable  lbs, Left:   lbs  COORDINATION: Impaired unable to make composite fist unable to do 2. Or 3 point pinch  SENSATION: Numbness in ulnar forearm into hand and fifth digit and ulnar side of fourth Report some pins-and-needles in DIP of third Patient with increased pain and tenderness over cubital tunnel as well as Guyon's canal.  Tenderness over volar wrist carpal tunnel  12/05/23: [place] [employer] was done.  Patient feel only deep pressure on ulnar side of distal upper arm elbow forearm and hand. Also not able to fill hot and cold. 12/20/23 Semmes-Weinstein this date on ulnar upper arm to mid forearm 4.56  consistent and delayed but able to identify 4.31  01/31/24 Semmes-Weinstein this date on ulnar upper arm to mid forearm 4.56  consistent and delayed but able to identify 4.31   EDEMA: Patient report had severe edema postop in right hand and wrist.  Continue to have some bruising over her dorsal wrist  COGNITION: Overall cognitive status: WNL      TREATMENT DATE: 02/10/24     Patient continues reports of increased nerve  pain in the palm and digits. Patient arrive with increased sensation Increase sensation on the fifth digit.  Able to tell hot and cold.  Easier to put on a glove and feel textures.   Encouraged patient to continue to use Isotoner glove Discussed also the importance of doing in the morning contrast or heat and range of motion to provide functional hand for patient to be able to grasp  Patient to continue with home program for thoracic swelling patient continues to have some postop swelling on the right thoracic. Patient to wear light compression shirt provided patient with comprex foam to facilitate lymphatic flow horizontal from right AAA and PAA to the left Nighttime and also during the day at home.  Reviewed with patient again wearing it correctly Can also do some light MLD from her right AI to inguinal lymph nodes as well as right AAA to the left 20 reps each 2 times a day Reports surgeon told him the same thing about compression.   Patient to continue with contrast at home  Used to the state flexion glove for a dynamic flexion stretch during 2 minutes heat Adjusted again flexion glove shortened 4th and 5th digit band and repeat again 2 minutes heat With extension of digits again for another 2 minutes Followed by MCP flexion dynamic splint (knuckle bender )And flexion glove to facilitate composite flexion.  2 to 3 minutes Afterwards facilitating composite flexion with knuckle bender splint in place to maintain MC flexion To 3 cm foam roller placed on hold 8-10 reps Patient's flexion in splint  70 to 75 degrees at MCPs and 75 to 80 degrees at PIPs.   BUT NEED TO DO IN AM TO facilitate increased functional use at home during the day As well as some during the day  Facilitating light blue putty digit extension into the putty patient unable to abduct or maintain adduction of digits applied light Coban wrap at proximal phalanges to keep fingers together to facilitate digit extension pushing  into the putty Patient focusing on her hyperextension.  As well as supporting forearm 2 sets of 10 repetitions 3 seconds hold Followed by facilitation of lumbrical flexion pulling into the putty against the putty patient needs max assist plus max verbal cueing to not compensate, to support forearm 3-4 sets of 8 reps 30 seconds Followed by facilitation lumbrical flexion with PIP flexion pulling into the putty needs max assist with max verbal cueing support forearm facilitate pulling 3 seconds placing putty in correct position as well as facilitating rolling foam roller over putty initiating flexion against resistance. Added to home exercises. Attempting lateral pinch with 2 pound clothing pen  placing hold can do a quarter to half of pressure 7 reps max exertion  Patient to facilitate and placing whole thumb palmar abduction around lacrosse ball prior to balance  or throw.  Patient was able to sign language with the right hand prior to injury. Encourage patient to attempt as much as he can to do that as well as copying the left hand as well as attempts to place and hold different signs different positions.  Patient  able to facilitate palmar abduction sliding over lacrosse ball or baseball. Patient able to show active range of motion three quarters active assist to finish.          HEP: wall push-ups tolerating well pressure through the palm Weightbearing through the palms with scapular retraction shoulder horizontal abduction alternating left and right while weightbearing through the opposite All 12 reps Patient can add to home exercises   HEP Patient is a yoga instructor Had patient weight-bear through elbow on pillow with applying through the knee able to do scapular retraction with elbow flexion at 90 alternating can add to home exercises 12 reps Prayer stretch 1 minute Plank through elbows and knee can do thoracic straight through and horizontal abduction alternating 12  reps Patient can do floor activities through the elbows with cushioning and do wall activities through the palm After doing wrist extension stretches                                                                                                                       PATIENT EDUCATION: Education details: Progressing changes in  HEP  Person educated: Patient Education method: Explanation, Demonstration, Tactile cues, Verbal cues, and Handouts Education comprehension: verbalized understanding, returned demonstration, verbal cues required, and needs further education    GOALS: Goals reviewed with patient? Yes  LONG TERM GOALS: Target date: 12 wks   Patient to be independent in home program to  modify right upper extremity in ADLs and IADLs as well as positioning during the day and night to decrease irritation on cubital tunnel and volar wrist with decreased pain Baseline: Pain over the cubital tunnel and Guyon's canal 6-7/10.  With increased tenderness and pain increased numbness over her ulnar forearm wrist and hand into fifth and ulnar side of fourth digit Goal status: Met  2.  Patient to be independent in home program to increase proprioception and ease  of right shoulder, elbow range of motion to normal reaching pattern  Baseline: Patient showed decreased coordination and proprioception in the reaching with the right upper extremity compensating with trunk and scapula Goal status: Met  3.  Right wrist and forearm strength increased to 4+ to initiate strengthening and carrying groceries less than 4 pounds within no increase symptoms Baseline: Active range of motion within functional limits for supination pronation  but decreased wrist extension/ flexion decreased strength in ulnar deviation Goal status: Progressing  4.  Digit active range of motion increased for patient to make composite fist and open hand to grasp around toothbrush and release, put hand in  pocket Baseline: Three-quarter of a fist.  Decreased PIP extension with a lumbrical fist.  Decreased intrinsic first and second digit unable to touch any fingertips with opposition.  Numbness on ulnar side of forearm into the wrist hand fifth digit and ulnar fourth Goal status: Progressing  5.  Patient to be independent home program to decrease pain and tenderness from elbow to hand to less than 2/10 Baseline: Pain 6-7/10 at cubital tunnel and volar wrist and Ganz canal Goal status progressing  6.  Thumb active range of motion improved to within functional limits to be able to do palmar abduction to grasp around 4 cm object as well as increased opposition to pick up medication Baseline: Patient lacking 1 to 2 cm for opposition to second digit.  Unable to do opposition to any digits.  Decreased palmar abduction.  Radial abduction 45 degrees Goal status: Progressing    ASSESSMENT:  CLINICAL IMPRESSION: Patient patient followed by OT for increased weakness, increased pain and numbness in right dominant upper extremity since after surgery.  Patient had on 10/08/2023 a R VATS nerve sheath tumor excision -and hx of nephrectomy .  Patient present with increased pain and tenderness over right cubital tunnel as well as Guyon canal and carpal tunnel Patient with decreased coordination and proprioception and range of motion of right shoulder and elbow in ADLs and IADLs.  Patient with decreased range of motion and strength in her right wrist ,digits and thumb.  Most weakness stiffness in thumb palmar abduction, digit flexion extension as well as interosseous.  NOW  Patient with increased range of motion and strength in right shoulder , elbow and forearm -and wrist is improving.  Patient able to do modified yoga poses.  Patient is a Marine scientist part-time.  Patient can do weightbearing through the elbow and the knees for modified plank with scapular and shoulder stabilization exercises.  On the wall after  passive range of motion to the wrist patient can do weightbearing activities and proximal stability exercises through the palm on the wall.  Patient  with increased sensation in the right upper arm and elbow into the forearm -increase sensation in the digits including 5th and 4th.  Less nerve pain, Tinel or tenderness over the carpal tunnel.  Positive Tinel's at the ulnar nerve at [facility] with increased sensation in the digits.  Reinforced with patient importance  of doing edema control and and fitted patient with MCP dynamic flexion splint as well as fabricated flexion glove f last session -  patient to use to facilitate increased MP flexion, PIP flexion as well as composite flexion in the mornings especially but also during the day to facilitate functional use-  Patient is showing motor return in the hand but has to do passive range of motion and stretches in the morning as well as a few times during the day to facilitate increased functional use.  Patient able to grasp objects with 3rd through 5th digits, was able to do opposition and showing partial palmar abduction of the thumb.  Reviewed with patient home exercises.  Was able to facilitate some lumbrical flexion and PIP flexion against light blue putty as well as pushing for digit extension into the putty but needed some Coban wrap to keep fingers together unable to adduct and maintained.  Patient able to initiate palmar abduction against gravity or without gravity with placing hold rubber band.  Patient showed lateral pinch of 1 pound today.  Add light blue putty for strengthening.   Needed min to mod assist for home exercises patient limited in functional use of right upper extremity in ADLs and IADLs.  Patient can benefit from skilled OT services to decrease pain and numbness increase motion and strength to return to prior level of function.  PERFORMANCE DEFICITS: in functional skills including ADLs, IADLs, coordination, dexterity, proprioception, sensation,  edema, ROM, strength, pain, flexibility, Fine motor control, Gross motor control, decreased knowledge of precautions, and decreased knowledge of use of DME,  IMPAIRMENTS: are limiting patient from ADLs, IADLs, rest and sleep, work, play, leisure, and social participation.   COMORBIDITIES: may have co-morbidities  that affects occupational performance. Patient will benefit from skilled OT to address above impairments and improve overall function.  MODIFICATION OR ASSISTANCE TO COMPLETE EVALUATION: Min-Moderate modification of tasks or assist with assess necessary to complete an evaluation.  OT OCCUPATIONAL PROFILE AND HISTORY: Detailed assessment: Review of records and additional review of physical, cognitive, psychosocial history related to current functional performance.  CLINICAL DECISION MAKING: Moderate - several treatment options, min-mod task modification necessary  REHAB POTENTIAL: Good for goals  EVALUATION COMPLEXITY: Moderate      PLAN:  OT FREQUENCY: 1-2x/week  OT DURATION: 12 weeks  PLANNED INTERVENTIONS: 97168 OT Re-evaluation, 97535 self care/ADL training, 02889 therapeutic exercise, 97530 therapeutic activity, 97112 neuromuscular re-education, 97140 manual therapy, 97039 fluidotherapy, 97034 contrast bath, 97033 iontophoresis, passive range of motion, patient/family education, and DME and/or AE instructions    CONSULTED AND AGREED WITH PLAN OF CARE: Patient   Ancel Peters, OTR/L,CLT 02/10/2024, 4:08 PM

## 2024-02-11 ENCOUNTER — Ambulatory Visit (INDEPENDENT_AMBULATORY_CARE_PROVIDER_SITE_OTHER): Admitting: Family Medicine

## 2024-02-11 ENCOUNTER — Other Ambulatory Visit: Payer: Self-pay

## 2024-02-11 VITALS — BP 112/78 | HR 73 | Ht 69.0 in | Wt 196.0 lb

## 2024-02-11 DIAGNOSIS — M7021 Olecranon bursitis, right elbow: Secondary | ICD-10-CM | POA: Diagnosis not present

## 2024-02-11 NOTE — Patient Instructions (Addendum)
 Thank you for coming in today.   You received an injection today. Seek immediate medical attention if the joint becomes red, extremely painful, or is oozing fluid.   I recommend you obtained a compression sleeve to help with your joint problems. There are many options on the market however I recommend obtaining a Full Elbow Body Helix compression sleeve.  You can find information (including how to appropriate measure yourself for sizing) can be found at www.[email or web address].ch.  Many of these products are health savings account (HSA) eligible.   You can use the compression sleeve at any time throughout the day but is most important to use while being active as well as for 2 hours post-activity.   It is appropriate to ice following activity with the compression sleeve in place.   Reminder: Dr. Joane will be out of the office starting August 1st, for about 6 weeks

## 2024-02-13 ENCOUNTER — Ambulatory Visit: Admitting: Occupational Therapy

## 2024-02-13 DIAGNOSIS — M25641 Stiffness of right hand, not elsewhere classified: Secondary | ICD-10-CM | POA: Diagnosis not present

## 2024-02-13 DIAGNOSIS — M79601 Pain in right arm: Secondary | ICD-10-CM | POA: Diagnosis not present

## 2024-02-13 DIAGNOSIS — M6281 Muscle weakness (generalized): Secondary | ICD-10-CM

## 2024-02-13 DIAGNOSIS — M25631 Stiffness of right wrist, not elsewhere classified: Secondary | ICD-10-CM

## 2024-02-13 NOTE — Therapy (Signed)
 OUTPATIENT OCCUPATIONAL THERAPY ORTHO TREATMENT  Patient Name: Daniel Lawrence MRN: [id number] DOB:July 26, 1960, 63 y.o., male Today's Date: 02/13/2024  PCP: Dr Marylynn REFERRING PROVIDER:Dr Valli  END OF SESSION:  OT End of Session - 02/13/24 1701     Visit Number 24    Number of Visits 30    Date for OT Re-Evaluation 02/25/24    OT Start Time 1401    OT Stop Time 1458    OT Time Calculation (min) 57 min    Activity Tolerance Patient tolerated treatment well    Behavior During Therapy [facility] for tasks assessed/performed           Past Medical History:  Diagnosis Date   Allergy    seasonal   Arthritis    left knee   Cancer (HCC)    skin cancer   Cataract    Hyperlipidemia    Hypertension    Past Surgical History:  Procedure Laterality Date   BRAIN SURGERY  1971   COLONOSCOPY  05/03/2021   ELEVATION OF DEPRESSED SKULL FRACTURE  07/16/1969   traumatic blow to head by golf club   , 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 12/26/2023   Physical deconditioning 12/26/2023   Tubular adenoma of colon 05/29/2023   Hearing loss due to cerumen impaction, right 05/29/2023   AC (acromioclavicular) arthritis 02/27/2023   Increased prostate specific antigen (PSA) velocity 07/03/2022   Lipoma of right shoulder 05/21/2022   Low back pain 09/15/2021   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   Aortic atherosclerosis (HCC) 04/18/2021   Melanoma in situ of right upper arm (HCC) 04/18/2021   H/O right nephrectomy 04/17/2020   Degenerative arthritis of left knee 02/25/2018   Chronic pain of left knee  02/08/2018   CKD (chronic kidney disease) stage 3, GFR 30-59 ml/min (HCC) 02/08/2018   Coronary atherosclerosis due to lipid rich plaque 05/26/2016   History of renal cell carcinoma 04/20/2016   B12 deficiency 04/08/2016   Encounter for preventive health examination 06/24/2014   Hypertension 06/23/2014   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  Especially some strong once in my pinky.  I did see the doctor and they drained the fluid of my right elbow.  He recommended for me to use like a compression sleeve, elbow and try not to prop up my elbow as much.  More of my forearm.  Pt accompanied by: self  PERTINENT HISTORY: Patient had on 10/08/2023 by Dr. 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  Mostly at At medial elbow and wrist and hand. PRECAUTIONS: Patient to follow surgeon's precautions    WEIGHT BEARING RESTRICTIONS: no  PAIN:  Are you having pain?5 /10 nerve pain mostly in the right ulnar hand  FALLS: Has patient fallen in last 6 months? No  LIVING ENVIRONMENT: Lives with: lives with their spouse  PLOF: Patient prior to surgery had normal active range of motion and strength.  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  Working and teaching yoga and doing things around the house  NEXT MD VISIT:?  OBJECTIVE:  Note: Objective measures were completed at Evaluation unless otherwise noted.  HAND DOMINANCE: Right  ADLs: Patient mostly limited by wrist and hand weakness.  Unable to grip objects and pick it up with weight as well as fine motor unable to pick up small objects.    UPPER EXTREMITY ROM at eval     Patient with decreased proprioception and coordination in right upper extremity range of motion.  Slow and deliberate. Pain and tenderness over right cubital tunnel Increased pain and tenderness over Guyon's canal As well as tenderness over carpal tunnel at volar wrist Bruising present over dorsal wrist. Patient reports he had some IVs in dorsal R wrist  And had a lot of swelling in R hand and wrist postop composite nerve glide and shoulder abduction had increased pain at stage 4 of 5.  Pain from elbow into wrist and hand.    Active ROM Right eval Left eval  Shoulder flexion    Shoulder abduction    Shoulder adduction    Shoulder extension    Shoulder internal rotation    Shoulder external rotation    Elbow flexion    Elbow extension    Wrist flexion 84   Wrist extension 64   Wrist ulnar deviation 20   Wrist radial deviation 25   Wrist pronation 90   Wrist supination 90      At eval  Extension or tapping of digits on right hand of table needs passive range of motion and placed on hold for second digit.  3rd through 5th 3 -/5 strength Lumbrical fist decreased PIP extension Intrinsic a fist within functional limits 3rd through 5th decrease in second digit Decrease strength for dorsal and volar interossei Composite fist three-quarter range Opening of digits limited at PIP extension after composite fist.  Active ROM Right eval Left eval R 10/31/23 R 11/05/23 R/12/03/23 R 12/17/23 Place and hold  R 01/16/24  Thumb MCP (0-60) 35      48/ L 50  Thumb IP (0-80) 10      30 / L 70   Thumb Radial abd/add (0-55) WFL     WFL  55 including EPB  Thumb Palmar abd/add (0-45) impaired     Place and hold 45  Able to do AAROM over ball   Thumb Opposition to Small Finger Unable to do any opposition even to second digit   Opposed to middle phalanges of second digit Pick up 2 cm foam block to 3rd      Index MCP (0-90)    75 80 80 80   Index PIP (0-100)    35 35 60 70   Index DIP (0-70)           Long MCP (0-90)  75 80 80 80   Long PIP (0-100)     60 65 65 70   Long DIP (0-70)           Ring MCP (0-90)     80 85 85 85   Ring PIP (0-100)     75 85 85 85   Ring DIP (0-70)           Little MCP (0-90)     80 90 90 90   Little PIP (0-100)     75 90 90 90   Little DIP (0-70)           (Blank rows = not tested)  HAND FUNCTION: Not tested evaluation  11/05/23 Grip strength: Right: 0 lbs; Left: 76 lbs, Lateral pinch: Right: 0 lbs, Left:   lbs, and 3 point pinch: Right: 0 lbs, Left:   lbs 02/03/24 Grip strength: Right: NT lbs; Left: 76 lbs, Lateral pinch: Right: 1 lbs, Left:   lbs, and 3 point pinch: Right: unable  lbs, Left:   lbs  COORDINATION: Impaired unable to make composite fist unable to do 2. Or 3 point pinch  SENSATION: Numbness in ulnar forearm into hand and fifth digit and ulnar side of fourth Report some pins-and-needles in DIP of third Patient with increased pain and tenderness over cubital tunnel as well as Guyon's canal.  Tenderness over volar wrist carpal tunnel  12/05/23: Gust[place] Speed was done.  Patient feel only deep pressure on ulnar side of distal upper arm elbow forearm and hand. Also not able to fill hot and cold. 12/20/23 Semmes-Weinstein this date on ulnar upper arm to mid forearm 4.56  consistent and delayed but able to identify 4.31  01/31/24 Semmes-Weinstein this date on ulnar upper arm to mid forearm 4.56  consistent and delayed but able to identify 4.31  02/13/24 Semmes-Weinstein this date on ulnar upper arm to 4.31;  proximal 2/3 forearm 4.56, deep  pressure tat ulnar  DPC of hand and 2.83 normal sensation thumb thru 4th   EDEMA: Patient report had severe edema postop in right hand and wrist.  Continue to have some bruising over her dorsal wrist  COGNITION: Overall cognitive status: WNL      TREATMENT DATE: 02/13/24     Patient continues reports of increased nerve pain in the palm and digits. Patient arrive with increased sensation - see Gustabo Speed test above under sensation   Patient to continue with home program for thoracic swelling patient continues to have some postop swelling on the right thoracic. Patient to wear light compression shirt provided patient with comprex foam to facilitate lymphatic flow horizontal from right AAA and PAA to the left Nighttime and also during the day at home.  Reviewed with patient again wearing it correctly Can also do some light MLD from her right AI to inguinal lymph nodes as well as right AAA to the left 20 reps each 2 times a day Reports surgeon told him the same thing about compression.   Patient to continue with contrast at home  used flexion glove for a dynamic flexion stretch  digits flexion during 2 minutes heat Adjusted again flexion glove shortened 4th and 5th digit band and repeat again 2 minutes heat With extension of digits inbetween Followed by MCP flexion dynamic splint (knuckle bender )And flexion glove to facilitate composite flexion.  2 to 3 minutes Afterwards facilitating  PROM composite flexion with knuckle bender splint in place to maintain MC flexion at 90 and PROM  to bar in palm  Followed by AROM and place and hold flexion to 3 cm foam roller placed in palm 8-10 reps   BUT NEED TO DO IN AM TO facilitate increased functional use at home during the day As well as some during the day  Facilitating digit flexion and extension rolling flex bar over putty 10-15 reps each with attempts to stabilize forearm.  Patient uses a lot of shoulder elevation and abduction for  proximal stability  Also following digit extension pushing flex bar over putty needs some Coban wrap around proximal phalanges to keep digits adducted.   Patient gripping around flex bar with Coban around for traction using the flexion glove patient pushing with flex bar into the putty while lateral and multidirectional pressure applied to flex bar.   15 reps hold 10 seconds  Followed by gripping using thumb opposition 2nd through 4th digits lacrosse ball pushing into the putty pulling off 2 sets of 10.  Patient a easier time holding off if only pushing 2 seconds in.   Add to home exercises.     Patient to continue with placing whole thumb palmar abduction around lacrosse ball prior to balance  or throw.  Patient was able to sign language with the right hand prior to injury. Encourage patient to attempt as much as he can to do that as well as copying the left hand as well as attempts to place and hold different signs different positions.  Patient  able to facilitate palmar abduction sliding over lacrosse ball or baseball. Patient able to show active range of motion three quarters active assist to finish.          HEP: wall push-ups tolerating well pressure through the palm Weightbearing through the palms with scapular retraction shoulder horizontal abduction alternating left and right while weightbearing through the opposite All 12 reps Patient can add to home exercises   HEP Patient is a yoga instructor Had patient weight-bear through elbow on pillow with applying through the knee able to do scapular retraction with elbow flexion at 90 alternating can add to home exercises 12 reps Prayer stretch 1 minute Plank through elbows and knee can do thoracic straight through and horizontal abduction alternating 12 reps Patient can do floor activities through the elbows with cushioning and do wall activities through the palm After doing wrist extension stretches                                                                                                                        PATIENT EDUCATION: Education details: Progressing changes in  HEP  Person educated: Patient Education method: Explanation, Demonstration, Tactile cues, Verbal cues, and Handouts Education comprehension: verbalized understanding, returned demonstration, verbal cues required, and needs further education    GOALS: Goals reviewed with patient? Yes  LONG TERM GOALS: Target date: 12 wks   Patient to be independent in home program to modify right upper extremity in ADLs and IADLs as well as positioning during  the day and night to decrease irritation on cubital tunnel and volar wrist with decreased pain Baseline: Pain over the cubital tunnel and Guyon's canal 6-7/10.  With increased tenderness and pain increased numbness over her ulnar forearm wrist and hand into fifth and ulnar side of fourth digit Goal status: Met  2.  Patient to be independent in home program to increase proprioception and ease  of right shoulder, elbow range of motion to normal reaching pattern  Baseline: Patient showed decreased coordination and proprioception in the reaching with the right upper extremity compensating with trunk and scapula Goal status: Met  3.  Right wrist and forearm strength increased to 4+ to initiate strengthening and carrying groceries less than 4 pounds within no increase symptoms Baseline: Active range of motion within functional limits for supination pronation  but decreased wrist extension/ flexion decreased strength in ulnar deviation Goal status: Progressing  4.  Digit active range of motion increased for patient to make composite fist and open hand to grasp around toothbrush and release, put hand in pocket Baseline: Three-quarter of a fist.  Decreased PIP extension with a lumbrical fist.  Decreased intrinsic first and second digit unable to touch any fingertips with opposition.  Numbness  on ulnar side of forearm into the wrist hand fifth digit and ulnar fourth Goal status: Progressing  5.  Patient to be independent home program to decrease pain and tenderness from elbow to hand to less than 2/10 Baseline: Pain 6-7/10 at cubital tunnel and volar wrist and Ganz canal Goal status progressing  6.  Thumb active range of motion improved to within functional limits to be able to do palmar abduction to grasp around 4 cm object as well as increased opposition to pick up medication Baseline: Patient lacking 1 to 2 cm for opposition to second digit.  Unable to do opposition to any digits.  Decreased palmar abduction.  Radial abduction 45 degrees Goal status: Progressing    ASSESSMENT:  CLINICAL IMPRESSION: Patient patient followed by OT for increased weakness, increased pain and numbness in right dominant upper extremity since after surgery.  Patient had on 10/08/2023 a R VATS nerve sheath tumor excision -and hx of nephrectomy .  Patient present with increased pain and tenderness over right cubital tunnel as well as Guyon canal and carpal tunnel Patient with decreased coordination and proprioception and range of motion of right shoulder and elbow in ADLs and IADLs.  Patient with decreased range of motion and strength in her right wrist ,digits and thumb.  Most weakness stiffness in thumb palmar abduction, digit flexion extension as well as interosseous.  NOW  Patient with increased range of motion and strength in right shoulder , elbow and forearm -and wrist is improving.  Patient able to do modified yoga poses.  Patient is a Marine scientist part-time.  Patient can do weightbearing through the elbow and the knees for modified plank with scapular and shoulder stabilization exercises.  On the wall after passive range of motion to the wrist patient can do weightbearing activities and proximal stability exercises through the palm on the wall.  Patient  with increased sensation in the right upper arm  and elbow into the forearm -increase sensation in the digits including 5th and 4th.  Less nerve pain, Tinel or tenderness over the carpal tunnel.  Positive Tinel's at the ulnar nerve at [facility] with increased sensation in the digits.  Reinforced with patient importance of doing edema control using few times a day MCP dynamic flexion splint  as well as  flexion glove-  patient to use to facilitate increased MP flexion, PIP flexion as well as composite flexion in the mornings especially but also during the day to facilitate functional use-  Patient is showing motor return in the hand but has to do passive range of motion and stretches in the morning as well as a few times during the day to facilitate increased functional use.  Patient able to grasp objects with 3rd through 5th digits, was able to do opposition and showing partial palmar abduction of the thumb.  Reviewed with patient home exercises.  Was able to facilitate some lumbrical flexion and PIP flexion rolling and pushing foam roller over putty as well as pushing lacrosse ball into putty and pulling it off.  Patient able to initiate palmar abduction against gravity or without gravity with placing hold rubber band.  Patient showed lateral pinch of 1 pound today.  Add light blue putty for strengthening.   Needed min to mod assist for home exercises patient limited in functional use of right upper extremity in ADLs and IADLs.  Patient can benefit from skilled OT services to decrease pain and numbness increase motion and strength to return to prior level of function.  PERFORMANCE DEFICITS: in functional skills including ADLs, IADLs, coordination, dexterity, proprioception, sensation, edema, ROM, strength, pain, flexibility, Fine motor control, Gross motor control, decreased knowledge of precautions, and decreased knowledge of use of DME,  IMPAIRMENTS: are limiting patient from ADLs, IADLs, rest and sleep, work, play, leisure, and social participation.    COMORBIDITIES: may have co-morbidities  that affects occupational performance. Patient will benefit from skilled OT to address above impairments and improve overall function.  MODIFICATION OR ASSISTANCE TO COMPLETE EVALUATION: Min-Moderate modification of tasks or assist with assess necessary to complete an evaluation.  OT OCCUPATIONAL PROFILE AND HISTORY: Detailed assessment: Review of records and additional review of physical, cognitive, psychosocial history related to current functional performance.  CLINICAL DECISION MAKING: Moderate - several treatment options, min-mod task modification necessary  REHAB POTENTIAL: Good for goals  EVALUATION COMPLEXITY: Moderate      PLAN:  OT FREQUENCY: 1-2x/week  OT DURATION: 12 weeks  PLANNED INTERVENTIONS: 97168 OT Re-evaluation, 97535 self care/ADL training, 02889 therapeutic exercise, 97530 therapeutic activity, 97112 neuromuscular re-education, 97140 manual therapy, 97039 fluidotherapy, 97034 contrast bath, 97033 iontophoresis, passive range of motion, patient/family education, and DME and/or AE instructions    CONSULTED AND AGREED WITH PLAN OF CARE: Patient   Ancel Peters, OTR/L,CLT 02/13/2024, 5:03 PM

## 2024-02-17 ENCOUNTER — Ambulatory Visit: Attending: Thoracic Surgery (Cardiothoracic Vascular Surgery) | Admitting: Occupational Therapy

## 2024-02-17 DIAGNOSIS — M6281 Muscle weakness (generalized): Secondary | ICD-10-CM | POA: Diagnosis not present

## 2024-02-17 DIAGNOSIS — M25641 Stiffness of right hand, not elsewhere classified: Secondary | ICD-10-CM | POA: Diagnosis not present

## 2024-02-17 DIAGNOSIS — M79601 Pain in right arm: Secondary | ICD-10-CM | POA: Insufficient documentation

## 2024-02-17 DIAGNOSIS — M25631 Stiffness of right wrist, not elsewhere classified: Secondary | ICD-10-CM | POA: Diagnosis not present

## 2024-02-17 NOTE — Therapy (Signed)
 OUTPATIENT OCCUPATIONAL THERAPY ORTHO TREATMENT  Patient Name: Daniel Lawrence MRN: [id number] DOB:1960/11/04, 63 y.o., male Today's Date: 02/17/2024  PCP: Dr Marylynn REFERRING PROVIDER:Dr Valli  END OF SESSION:  OT End of Session - 02/17/24 1348     Visit Number 25    Number of Visits 30    Date for OT Re-Evaluation 02/25/24    OT Start Time 1345    OT Stop Time 1435    OT Time Calculation (min) 50 min    Activity Tolerance Patient tolerated treatment well    Behavior During Therapy [facility] for tasks assessed/performed           Past Medical History:  Diagnosis Date   Allergy    seasonal   Arthritis    left knee   Cancer (HCC)    skin cancer   Cataract    Hyperlipidemia    Hypertension    Past Surgical History:  Procedure Laterality Date   BRAIN SURGERY  1971   COLONOSCOPY  05/03/2021   ELEVATION OF DEPRESSED SKULL FRACTURE  07/16/1969   traumatic blow to head by golf club   , [facility])   MOHS SURGERY  09/14/2011   left temple, squamous   ROBOT ASSISTED LAPAROSCOPIC NEPHRECTOMY Right 04/20/2016   Procedure: XI ROBOTIC ASSISTED LAPAROSCOPIC RADICAL NEPHRECTOMY;  Surgeon: Ricardo Likens, MD;  Location: [facility];  Service: Urology;  Laterality: Right;   Patient Active Problem List   Diagnosis Date Noted   Olecranon bursitis, right elbow 02/11/2024   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 12/26/2023   Physical deconditioning 12/26/2023   Tubular adenoma of colon 05/29/2023   Hearing loss due to cerumen impaction, right 05/29/2023   AC (acromioclavicular) arthritis 02/27/2023   Increased prostate specific antigen (PSA) velocity 07/03/2022   Lipoma of right shoulder 05/21/2022   Low back pain 09/15/2021   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   Aortic atherosclerosis (HCC) 04/18/2021   Melanoma in situ of right upper arm (HCC) 04/18/2021   H/O right nephrectomy 04/17/2020   Degenerative arthritis of left knee 02/25/2018   Chronic pain of left knee  02/08/2018   CKD (chronic kidney disease) stage 3, GFR 30-59 ml/min (HCC) 02/08/2018   Coronary atherosclerosis due to lipid rich plaque 05/26/2016   History of renal cell carcinoma 04/20/2016   B12 deficiency 04/08/2016   Encounter for preventive health examination 06/24/2014   Hypertension 06/23/2014   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  I have to say it feels good and makes that I do not bend my elbow and put pressure on it but is tight at night.  Is been doing the splinting.   Pt accompanied by: self  PERTINENT HISTORY: Patient had on 10/08/2023 by Dr. 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  Mostly at At medial elbow and wrist and hand. PRECAUTIONS: Patient to  follow surgeon's precautions    WEIGHT BEARING RESTRICTIONS: no  PAIN:  Are you having pain?5 /10 nerve pain mostly in the right ulnar hand  FALLS: Has patient fallen in last 6 months? No  LIVING ENVIRONMENT: Lives with: lives with their spouse  PLOF: Patient prior to surgery had normal active range of motion and strength.  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  Working and teaching yoga and doing things around the house  NEXT MD  VISIT:?  OBJECTIVE:  Note: Objective measures were completed at Evaluation unless otherwise noted.  HAND DOMINANCE: Right  ADLs: Patient mostly limited by wrist and hand weakness.  Unable to grip objects and pick it up with weight as well as fine motor unable to pick up small objects.    UPPER EXTREMITY ROM at eval     Patient with decreased proprioception and coordination in right upper extremity range of motion.  Slow and deliberate. Pain and tenderness over right cubital tunnel Increased pain and tenderness over Guyon's canal As well as tenderness over carpal tunnel at volar wrist Bruising present over dorsal wrist. Patient reports he had some IVs in dorsal R wrist  And had a lot of swelling in R hand and wrist postop composite nerve glide and shoulder abduction had increased pain at stage 4 of 5.  Pain from elbow into wrist and hand.    Active ROM Right eval Left eval  Shoulder flexion    Shoulder abduction    Shoulder adduction    Shoulder extension    Shoulder internal rotation    Shoulder external rotation    Elbow flexion    Elbow extension    Wrist flexion 84   Wrist extension 64   Wrist ulnar deviation 20   Wrist radial deviation 25   Wrist pronation 90   Wrist supination 90      At eval  Extension or tapping of digits on right hand of table needs passive range of motion and placed on hold for second digit.  3rd through 5th 3 -/5 strength Lumbrical fist decreased PIP extension Intrinsic a fist within functional limits 3rd through 5th decrease in second digit Decrease strength for dorsal and volar interossei Composite fist three-quarter range Opening of digits limited at PIP extension after composite fist.  Active ROM Right eval Left eval R 10/31/23 R 11/05/23 R/12/03/23 R 12/17/23 Place and hold  R 01/16/24  Thumb MCP (0-60) 35      48/ L 50  Thumb IP (0-80) 10      30 / L 70  Thumb Radial abd/add (0-55) WFL     WFL  55 including EPB  Thumb Palmar  abd/add (0-45) impaired     Place and hold 45  Able to do AAROM over ball   Thumb Opposition to Small Finger Unable to do any opposition even to second digit   Opposed to middle phalanges of second digit Pick up 2 cm foam block to 3rd      Index MCP (0-90)    75 80 80 80   Index PIP (0-100)    35 35 60 70   Index DIP (0-70)           Long MCP (0-90)     75 80 80 80   Long PIP (0-100)  60 65 65 70   Long DIP (0-70)           Ring MCP (0-90)     80 85 85 85   Ring PIP (0-100)     75 85 85 85   Ring DIP (0-70)           Little MCP (0-90)     80 90 90 90   Little PIP (0-100)     75 90 90 90   Little DIP (0-70)           (Blank rows = not tested)  HAND FUNCTION: Not tested evaluation  11/05/23 Grip strength: Right: 0 lbs; Left: 76 lbs, Lateral pinch: Right: 0 lbs, Left:   lbs, and 3 point pinch: Right: 0 lbs, Left:   lbs 02/03/24 Grip strength: Right: NT lbs; Left: 76 lbs, Lateral pinch: Right: 1 lbs, Left:   lbs, and 3 point pinch: Right: unable  lbs, Left:   lbs  COORDINATION: Impaired unable to make composite fist unable to do 2. Or 3 point pinch  SENSATION: Numbness in ulnar forearm into hand and fifth digit and ulnar side of fourth Report some pins-and-needles in DIP of third Patient with increased pain and tenderness over cubital tunnel as well as Guyon's canal.  Tenderness over volar wrist carpal tunnel  12/05/23: [place] Speed was done.  Patient feel only deep pressure on ulnar side of distal upper arm elbow forearm and hand. Also not able to fill hot and cold. 12/20/23 Semmes-Weinstein this date on ulnar upper arm to mid forearm 4.56  consistent and delayed but able to identify 4.31  01/31/24 Semmes-Weinstein this date on ulnar upper arm to mid forearm 4.56  consistent and delayed but able to identify 4.31  02/13/24 Semmes-Weinstein this date on ulnar upper arm to 4.31;  proximal 2/3 forearm 4.56, deep pressure tat ulnar  DPC of hand and 2.83 normal sensation thumb thru 4th    EDEMA: Patient report had severe edema postop in right hand and wrist.  Continue to have some bruising over her dorsal wrist  COGNITION: Overall cognitive status: WNL      TREATMENT DATE: 02/17/24     Patient continues reports of increased nerve pain in the palm and digits.  Patient to continue with home program for thoracic swelling patient continues to have some postop swelling on the right thoracic. Patient to wear light compression shirt provided patient with comprex foam to facilitate lymphatic flow horizontal from right AAA and PAA to the left Nighttime and also during the day at home.  Reviewed with patient again wearing it correctly Can also do some light MLD from her right AI to inguinal lymph nodes as well as right AAA to the left 20 reps each 2 times a day Reports surgeon told him the same thing about compression. Patient has compression sleeve for right elbow for persistent that was drained.  Provided him a Tubigrip D for nighttime for more comfort   Patient to continue with contrast at home  used flexion glove for a dynamic flexion stretch  digits flexion during 2 minutes heat Adjusted again flexion glove shortened 4th and 5th digit band and repeat again 2 minutes heat With extension of digits inbetween Followed by MCP flexion dynamic splint (knuckle bender )And flexion glove to facilitate composite flexion.  2 to 3 minutes Afterwards facilitating  PROM composite flexion with knuckle bender splint in place to maintain MC flexion at 90 and PROM to bar in palm  Followed by AROM and place and hold flexion to 3 cm foam roller placed in palm 8-10 reps  This date fabricated patient antiulnar nerve or antiulnar close splint to facilitate during the day 4th and 5th metacarpal flexion to 60 degrees and facilitate PIP extension but also allowing flexion.  Patient to wear over his compression glove on and off during the day to facilitate functional grip. Patient was educated in  donning and doffing as well as wearing.  As well as precautions. Patient demonstrated and verbalized understanding.   BUT NEED TO DO IN AM TO facilitate increased functional use at home during the day As well as some during the day  Patient able to grasp lacrosse ball and 7 inch ball - grasp 3-5 sec with and without MC flexion splint on   lacrosse ball pushing into the putty pulling off 1 set of 10.  Patient a easier time holding off if only pushing 2 seconds in.   Add to home exercises.   Patient incision able to reach grasp pick up across ball pick up measuring tape picking up glove picking up mouse but mostly over the ulnar side of the hand and needed supination scoop to not drop it Attempted for patient to use pen again a modified pen for patient to rest second digit over the Y with some guidance from the third and thumb to right.  Patient was able to write his name and last name as well as draw a stick figure legible.  Encourage patient to continue to attempt to do sign language as much as he can to do that as well as copying the left hand as well as attempts to place and hold different signs different positions.  Patient  able to facilitate palmar abduction sliding over lacrosse ball or baseball. Patient able to show active range of motion three quarters active assist to finish.          HEP: wall push-ups tolerating well pressure through the palm Weightbearing through the palms with scapular retraction shoulder horizontal abduction alternating left and right while weightbearing through the opposite All 12 reps Patient can add to home exercises   HEP Patient is a yoga instructor Had patient weight-bear through elbow on pillow with applying through the knee able to do scapular retraction with elbow flexion at 90 alternating can add to home exercises 12 reps Prayer stretch 1 minute Plank through elbows and knee can do thoracic straight through and horizontal abduction  alternating 12 reps Patient can do floor activities through the elbows with cushioning and do wall activities through the palm After doing wrist extension stretches                                                                                                                       PATIENT EDUCATION: Education details: Progressing changes in  HEP  Person educated: Patient Education method: Explanation, Demonstration, Tactile cues, Verbal cues, and Handouts Education comprehension: verbalized understanding, returned demonstration, verbal cues required,  and needs further education    GOALS: Goals reviewed with patient? Yes  LONG TERM GOALS: Target date: 12 wks   Patient to be independent in home program to modify right upper extremity in ADLs and IADLs as well as positioning during the day and night to decrease irritation on cubital tunnel and volar wrist with decreased pain Baseline: Pain over the cubital tunnel and Guyon's canal 6-7/10.  With increased tenderness and pain increased numbness over her ulnar forearm wrist and hand into fifth and ulnar side of fourth digit Goal status: Met  2.  Patient to be independent in home program to increase proprioception and ease  of right shoulder, elbow range of motion to normal reaching pattern  Baseline: Patient showed decreased coordination and proprioception in the reaching with the right upper extremity compensating with trunk and scapula Goal status: Met  3.  Right wrist and forearm strength increased to 4+ to initiate strengthening and carrying groceries less than 4 pounds within no increase symptoms Baseline: Active range of motion within functional limits for supination pronation  but decreased wrist extension/ flexion decreased strength in ulnar deviation Goal status: Progressing  4.  Digit active range of motion increased for patient to make composite fist and open hand to grasp around toothbrush and release, put hand  in pocket Baseline: Three-quarter of a fist.  Decreased PIP extension with a lumbrical fist.  Decreased intrinsic first and second digit unable to touch any fingertips with opposition.  Numbness on ulnar side of forearm into the wrist hand fifth digit and ulnar fourth Goal status: Progressing  5.  Patient to be independent home program to decrease pain and tenderness from elbow to hand to less than 2/10 Baseline: Pain 6-7/10 at cubital tunnel and volar wrist and Ganz canal Goal status progressing  6.  Thumb active range of motion improved to within functional limits to be able to do palmar abduction to grasp around 4 cm object as well as increased opposition to pick up medication Baseline: Patient lacking 1 to 2 cm for opposition to second digit.  Unable to do opposition to any digits.  Decreased palmar abduction.  Radial abduction 45 degrees Goal status: Progressing    ASSESSMENT:  CLINICAL IMPRESSION: Patient patient followed by OT for increased weakness, increased pain and numbness in right dominant upper extremity since after surgery.  Patient had on 10/08/2023 a R VATS nerve sheath tumor excision -and hx of nephrectomy .  Patient present with increased pain and tenderness over right cubital tunnel as well as Guyon canal and carpal tunnel Patient with decreased coordination and proprioception and range of motion of right shoulder and elbow in ADLs and IADLs.  Patient with decreased range of motion and strength in her right wrist ,digits and thumb.  Most weakness stiffness in thumb palmar abduction, digit flexion extension as well as interosseous.  NOW  Patient with increased range of motion and strength in right shoulder , elbow and forearm -and wrist is improving.  Patient able to do modified yoga poses.  Patient is a Marine scientist part-time.  Patient can do weightbearing through the elbow and the knees for modified plank with scapular and shoulder stabilization exercises.  On the wall after  passive range of motion to the wrist patient can do weightbearing activities and proximal stability exercises through the palm on the wall.  Patient  with increased sensation in the right upper arm and elbow into the forearm -increase sensation in the digits including 5th and 4th.  Less nerve pain, Tinel or tenderness over the carpal tunnel.  Positive Tinel's at the ulnar nerve at [facility] with increased sensation in the digits.  Reinforced with patient importance of doing edema control using few times a day MCP dynamic flexion splint as well as  flexion glove-  patient to use to facilitate increased MP flexion, PIP flexion as well as composite flexion in the mornings especially but also during the day to facilitate functional use-  Patient is showing motor return in the hand but has to do passive range of motion and stretches in the morning as well as a few times during the day to facilitate increased functional use.  Patient able to grasp objects with 3rd through 5th digits, was able to do opposition and showing partial palmar abduction of the thumb.  Reviewed with patient home exercises.  Was able to facilitate some lumbrical flexion and PIP flexion rolling and pushing foam roller over putty as well as pushing lacrosse ball into putty and pulling it off.  Patient this date able to grasp 7 inch ball 5 seconds and release several times.  Did fabricate patient a custom antiulnar close splint to facilitate more placement of MCP 60 degrees and facilitate PIP extension during the day.  Patient able to initiate palmar abduction against gravity or without gravity with placing hold rubber band.  Patient showed lateral pinch of 1 pound today.  Add light blue putty for strengthening.   Needed min to mod assist for home exercises patient limited in functional use of right upper extremity in ADLs and IADLs.  Patient can benefit from skilled OT services to decrease pain and numbness increase motion and strength to return to prior  level of function.  PERFORMANCE DEFICITS: in functional skills including ADLs, IADLs, coordination, dexterity, proprioception, sensation, edema, ROM, strength, pain, flexibility, Fine motor control, Gross motor control, decreased knowledge of precautions, and decreased knowledge of use of DME,  IMPAIRMENTS: are limiting patient from ADLs, IADLs, rest and sleep, work, play, leisure, and social participation.   COMORBIDITIES: may have co-morbidities  that affects occupational performance. Patient will benefit from skilled OT to address above impairments and improve overall function.  MODIFICATION OR ASSISTANCE TO COMPLETE EVALUATION: Min-Moderate modification of tasks or assist with assess necessary to complete an evaluation.  OT OCCUPATIONAL PROFILE AND HISTORY: Detailed assessment: Review of records and additional review of physical, cognitive, psychosocial history related to current functional performance.  CLINICAL DECISION MAKING: Moderate - several treatment options, min-mod task modification necessary  REHAB POTENTIAL: Good for goals  EVALUATION COMPLEXITY: Moderate      PLAN:  OT FREQUENCY: 1-2x/week  OT DURATION: 12 weeks  PLANNED INTERVENTIONS: 97168 OT Re-evaluation, 97535 self care/ADL training, 02889 therapeutic exercise, 97530 therapeutic activity, 97112 neuromuscular re-education, 97140 manual therapy, 97039 fluidotherapy, 97034 contrast bath, 97033 iontophoresis, passive range of motion, patient/family education, and DME and/or AE instructions    CONSULTED AND AGREED WITH PLAN OF CARE: Patient   Ancel Peters, OTR/L,CLT 02/17/2024, 5:11 PM

## 2024-02-21 ENCOUNTER — Ambulatory Visit: Admitting: Occupational Therapy

## 2024-02-21 DIAGNOSIS — M25641 Stiffness of right hand, not elsewhere classified: Secondary | ICD-10-CM | POA: Diagnosis not present

## 2024-02-21 DIAGNOSIS — M25631 Stiffness of right wrist, not elsewhere classified: Secondary | ICD-10-CM

## 2024-02-21 DIAGNOSIS — M6281 Muscle weakness (generalized): Secondary | ICD-10-CM | POA: Diagnosis not present

## 2024-02-21 DIAGNOSIS — M79601 Pain in right arm: Secondary | ICD-10-CM | POA: Diagnosis not present

## 2024-02-21 NOTE — Therapy (Signed)
 OUTPATIENT OCCUPATIONAL THERAPY ORTHO TREATMENT  Patient Name: Daniel Lawrence MRN: [id number] DOB:[birth year], 63 y.o., male Today's Date: 02/21/2024  PCP: Dr Marylynn REFERRING PROVIDER:Dr Valli  END OF SESSION:  OT End of Session - 02/21/24 1547     Visit Number 26    Number of Visits 30    Date for OT Re-Evaluation 02/25/24    OT Start Time 1120    OT Stop Time 1221    OT Time Calculation (min) 61 min    Activity Tolerance Patient tolerated treatment well    Behavior During Therapy [facility] for tasks assessed/performed           Past Medical History:  Diagnosis Date   Allergy    seasonal   Arthritis    left knee   Cancer (HCC)    skin cancer   Cataract    Hyperlipidemia    Hypertension    Past Surgical History:  Procedure Laterality Date   BRAIN SURGERY  1971   COLONOSCOPY  05/03/2021   ELEVATION OF DEPRESSED SKULL FRACTURE  07/16/1969   traumatic blow to head by golf club   , [facility])   MOHS SURGERY  09/14/2011   left temple, squamous   ROBOT ASSISTED LAPAROSCOPIC NEPHRECTOMY Right 04/20/2016   Procedure: XI ROBOTIC ASSISTED LAPAROSCOPIC RADICAL NEPHRECTOMY;  Surgeon: Ricardo Likens, MD;  Location: [facility];  Service: Urology;  Laterality: Right;   Patient Active Problem List   Diagnosis Date Noted   Olecranon bursitis, right elbow 02/11/2024   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 12/26/2023   Physical deconditioning 12/26/2023   Tubular adenoma of colon 05/29/2023   Hearing loss due to cerumen impaction, right 05/29/2023   AC (acromioclavicular) arthritis 02/27/2023   Increased prostate specific antigen (PSA) velocity 07/03/2022   Lipoma of right shoulder 05/21/2022   Low back pain 09/15/2021   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   Aortic atherosclerosis (HCC) 04/18/2021   Melanoma in situ of right upper arm (HCC) 04/18/2021   H/O right nephrectomy 04/17/2020   Degenerative arthritis of left knee 02/25/2018   Chronic pain of left knee  02/08/2018   CKD (chronic kidney disease) stage 3, GFR 30-59 ml/min (HCC) 02/08/2018   Coronary atherosclerosis due to lipid rich plaque 05/26/2016   History of renal cell carcinoma 04/20/2016   B12 deficiency 04/08/2016   Encounter for preventive health examination 06/24/2014   Hypertension 06/23/2014   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  I tried like to pull up my yoga mat but  Pt accompanied by: self  PERTINENT HISTORY: Patient had on 10/08/2023 by Dr. 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  Mostly at At medial elbow and wrist and hand. PRECAUTIONS: Patient to follow surgeon's precautions    WEIGHT BEARING RESTRICTIONS:  no  PAIN:  Are you having pain?  Denies nerve pain mostly at times shooting nerve pain or in the ulnar hand and fifth finger  FALLS: Has patient fallen in last 6 months? No  LIVING ENVIRONMENT: Lives with: lives with their spouse  PLOF: Patient prior to surgery had normal active range of motion and strength.  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  Working and teaching yoga and doing things around the  house  NEXT MD VISIT:?  OBJECTIVE:  Note: Objective measures were completed at Evaluation unless otherwise noted.  HAND DOMINANCE: Right  ADLs: Patient mostly limited by wrist and hand weakness.  Unable to grip objects and pick it up with weight as well as fine motor unable to pick up small objects.    UPPER EXTREMITY ROM at eval     Patient with decreased proprioception and coordination in right upper extremity range of motion.  Slow and deliberate. Pain and tenderness over right cubital tunnel Increased pain and tenderness over Guyon's canal As well as tenderness over carpal tunnel at volar wrist Bruising present over dorsal wrist. Patient reports he had some IVs in dorsal R wrist  And had a lot of swelling in R hand and wrist postop composite nerve glide and shoulder abduction had increased pain at stage 4 of 5.  Pain from elbow into wrist and hand.    Active ROM Right eval Left eval  Shoulder flexion    Shoulder abduction    Shoulder adduction    Shoulder extension    Shoulder internal rotation    Shoulder external rotation    Elbow flexion    Elbow extension    Wrist flexion 84   Wrist extension 64   Wrist ulnar deviation 20   Wrist radial deviation 25   Wrist pronation 90   Wrist supination 90      At eval  Extension or tapping of digits on right hand of table needs passive range of motion and placed on hold for second digit.  3rd through 5th 3 -/5 strength Lumbrical fist decreased PIP extension Intrinsic a fist within functional limits 3rd through 5th decrease in second digit Decrease strength for dorsal and volar interossei Composite fist three-quarter range PROM Opening of digits limited at PIP extension after composite fist.  Active ROM Right eval Left eval R 10/31/23 R 11/05/23 R/12/03/23 R 12/17/23 Place and hold  R 01/16/24  Thumb MCP (0-60) 35      48/ L 50  Thumb IP (0-80) 10      30 / L 70  Thumb Radial abd/add (0-55) WFL     WFL  55 including  EPB  Thumb Palmar abd/add (0-45) impaired     Place and hold 45  Able to do AAROM over ball   Thumb Opposition to Small Finger Unable to do any opposition even to second digit   Opposed to middle phalanges of second digit Pick up 2 cm foam block to 3rd      Index MCP (0-90)    75 80 80 80   Index PIP (0-100)    35 35 60 70   Index DIP (0-70)           Long MCP (0-90)     75 80 80 80   Long PIP (0-100)  60 65 65 70   Long DIP (0-70)           Ring MCP (0-90)     80 85 85 85   Ring PIP (0-100)     75 85 85 85   Ring DIP (0-70)           Little MCP (0-90)     80 90 90 90   Little PIP (0-100)     75 90 90 90   Little DIP (0-70)           (Blank rows = not tested)  HAND FUNCTION: Not tested evaluation  11/05/23 Grip strength: Right: 0 lbs; Left: 76 lbs, Lateral pinch: Right: 0 lbs, Left:   lbs, and 3 point pinch: Right: 0 lbs, Left:   lbs 02/03/24 Grip strength: Right: NT lbs; Left: 76 lbs, Lateral pinch: Right: 1 lbs, Left:   lbs, and 3 point pinch: Right: unable  lbs, Left:   lbs  COORDINATION: Impaired unable to make composite fist unable to do 2. Or 3 point pinch  SENSATION: Numbness in ulnar forearm into hand and fifth digit and ulnar side of fourth Report some pins-and-needles in DIP of third Patient with increased pain and tenderness over cubital tunnel as well as Guyon's canal.  Tenderness over volar wrist carpal tunnel  12/05/23: [place] Speed was done.  Patient feel only deep pressure on ulnar side of distal upper arm elbow forearm and hand. Also not able to fill hot and cold. 12/20/23 Semmes-Weinstein this date on ulnar upper arm to mid forearm 4.56  consistent and delayed but able to identify 4.31  01/31/24 Semmes-Weinstein this date on ulnar upper arm to mid forearm 4.56  consistent and delayed but able to identify 4.31  02/13/24 Semmes-Weinstein this date on ulnar upper arm to 4.31;  proximal 2/3 forearm 4.56, deep pressure tat ulnar  DPC of hand and 2.83 normal sensation  thumb thru 4th   EDEMA: Patient report had severe edema postop in right hand and wrist.  Continue to have some bruising over her dorsal wrist  COGNITION: Overall cognitive status: WNL      TREATMENT DATE: 02/21/24     Patient continues reports of increased nerve pain coming and going in the right hand  Patient to continue with home program for thoracic swelling patient continues to have some postop swelling on the right thoracic. Patient to wear light compression shirt provided patient with comprex foam to facilitate lymphatic flow horizontal from right AAA and PAA to the left Nighttime and also during the day at home.  Reviewed with patient again wearing it correctly Can also do some light MLD from her right AI to inguinal lymph nodes as well as right AAA to the left 20 reps each 2 times a day Reports surgeon told him the same thing about compression. Patient has compression sleeve for right elbow for bursa that was drained.  Provided him a Tubigrip D for nighttime for more comfort   Patient to continue with contrast/heat at home  used flexion glove for a dynamic flexion stretch  digits flexion during 2 minutes heat Adjusted again flexion glove shortened 4th and 5th digit band and repeat again 2 minutes heat With extension of digits inbetween Followed by MCP flexion dynamic splint (knuckle bender )And flexion glove to facilitate composite flexion.  2 to 3 minutes Afterwards facilitating  PROM composite flexion with knuckle bender splint in place to maintain MC flexion at 90 and PROM to bar  in palm    Followed by gentle PROM and weightbearing on the dorsal proximal phalanges of digits facilitating MP flexion hold for 5 to 10 seconds and then go into composite digit flexion 3 x 5 reps Patient grasp blue flexion bar with assistance of Coban flexion wrap -Pushing flexion bar into the pillow 10 reps facilitating elbow flexion extension stabilization at the wrist with a grasp - In  pronation position maintain flexion bar and grip with neutral pushing into the wall facilitating ulnar deviation.  With a grasp patient needed mod verbal cueing to position wrist in neutral prior to pushing into the wall 2 sets of 10 - Change position same exercises above but pushing into radial deviation maintaining wrist in neutral with a grasp into the wall flex bar with a grasp with some internal rotation 2 sets of 10  -Maintaining grasp on Flexibar neutral wrist with external hold in supination and pronation and ulnar radial deviation with yellow Thera-Band 10 reps each ADD HEP   Patient with lacrosse ball pushing into green medium putty 2 seconds putting off with thumb and 2nd and 3rd digits.  Alternating with pulling with ulnar 3 digits and thumb. Able to pull off with 2-second push but had a harder time with 3-second push to pull off Max verbal cueing with keeping as much possible palmar abduction MCPs from hyperextension and pressure down Patient showed increase in partial palmar abduction with a functional task. Upon further assessment patient was able to facilitate palmar abduction with lacrosse ball.  But cannot take resistance Palmar abduction with gravity over the edge of the table can do some isometric.  Add to home exercises Able to do radial abduction of thumb with rubber band sliding on paper finished passive range of motion for endrange. Attempt to facilitate palmar abduction and functional task grasping encouraging lumbrical fist different sizes objects.  Practice yoga mat rolling engaging in using lumbrical fist with thumb palmar abduction.  Patient to copy left hand is much using left hand while performing the same task with right hand -needed mod verbal cueing  Small green medium firm putty done lateral pinch as well as thumb adduction.  12 reps each At home exercises Needed mod verbal cueing for quality of movement     Patient to continue to wear on and off during the  day antiulnar nerve close splint  patient was educated in donning and doffing as well as wearing.  As well as precautions. Patient demonstrated and verbalized understanding.   BUT NEED TO DO IN AM GOOD HOME EXERCISE SESSION TO facilitate increased functional use at home during the day As well as some during the day   Encourage patient to continue to attempt to do sign language as much as he can to do that as well as copying the left hand as well as attempts to place and hold different signs different positions.   HEP: Plan to reassess and modify next  session wall push-ups tolerating well pressure through the palm Weightbearing through the palms with scapular retraction shoulder horizontal abduction alternating left and right while weightbearing through the opposite All 12 reps Patient can add to home exercises   HEP Patient is a yoga instructor Had patient weight-bear through elbow on pillow with applying through the knee able to do scapular retraction with elbow flexion at 90 alternating can add to home exercises 12 reps Prayer stretch 1 minute Plank through elbows and knee can do thoracic straight through and horizontal abduction alternating 12  reps Patient can do floor activities through the elbows with cushioning and do wall activities through the palm After doing wrist extension stretches                                                                                                                       PATIENT EDUCATION: Education details: Progressing changes in  HEP  Person educated: Patient Education method: Explanation, Demonstration, Tactile cues, Verbal cues, and Handouts Education comprehension: verbalized understanding, returned demonstration, verbal cues required, and needs further education    GOALS: Goals reviewed with patient? Yes  LONG TERM GOALS: Target date: 12 wks   Patient to be independent in home program to modify right upper extremity  in ADLs and IADLs as well as positioning during the day and night to decrease irritation on cubital tunnel and volar wrist with decreased pain Baseline: Pain over the cubital tunnel and Guyon's canal 6-7/10.  With increased tenderness and pain increased numbness over her ulnar forearm wrist and hand into fifth and ulnar side of fourth digit Goal status: Met  2.  Patient to be independent in home program to increase proprioception and ease  of right shoulder, elbow range of motion to normal reaching pattern  Baseline: Patient showed decreased coordination and proprioception in the reaching with the right upper extremity compensating with trunk and scapula Goal status: Met  3.  Right wrist and forearm strength increased to 4+ to initiate strengthening and carrying groceries less than 4 pounds within no increase symptoms Baseline: Active range of motion within functional limits for supination pronation  but decreased wrist extension/ flexion decreased strength in ulnar deviation Goal status: Progressing  4.  Digit active range of motion increased for patient to make composite fist and open hand to grasp around toothbrush and release, put hand in pocket Baseline: Three-quarter of a fist.  Decreased PIP extension with a lumbrical fist.  Decreased intrinsic first and second digit unable to touch any fingertips with opposition.  Numbness on ulnar side of forearm into the wrist hand fifth digit and ulnar fourth Goal status: Progressing  5.  Patient to be independent home program to decrease pain and tenderness from elbow to hand to less than 2/10 Baseline: Pain 6-7/10 at cubital tunnel and volar wrist and Ganz canal Goal status progressing  6.  Thumb active range of motion improved to within functional limits to be able to do palmar abduction to grasp around 4 cm object as well as increased opposition to pick up medication Baseline: Patient lacking 1 to 2 cm for opposition to second digit.  Unable to do  opposition to any digits.  Decreased palmar abduction.  Radial abduction 45 degrees Goal status: Progressing    ASSESSMENT:  CLINICAL IMPRESSION: Patient patient followed by OT for increased weakness, increased pain and numbness in right dominant upper extremity since after surgery.  Patient had on 10/08/2023 a R VATS nerve sheath tumor excision -and hx of nephrectomy .  Patient present with increased pain  and tenderness over right cubital tunnel as well as Guyon canal and carpal tunnel Patient with decreased coordination and proprioception and range of motion of right shoulder and elbow in ADLs and IADLs.  Patient with decreased range of motion and strength in her right wrist ,digits and thumb.  Most weakness stiffness in thumb palmar abduction, digit flexion extension as well as interosseous.  NOW  Patient with increased range of motion and strength in right shoulder , elbow and forearm -and wrist is improving.  Patient able to do modified yoga poses.  Patient is a Marine scientist part-time.  Patient can do weightbearing through the elbow and the knees for modified plank with scapular and shoulder stabilization exercises.  On the wall after passive range of motion to the wrist patient can do weightbearing activities and proximal stability exercises through the palm on the wall.  Patient  with increased sensation in the right upper arm and elbow into the forearm -increase sensation in the digits including 5th and 4th.  Less nerve pain, Tinel or tenderness over the carpal tunnel.  Positive Tinel's at the ulnar nerve at [facility] with increased sensation in the digits.  Reinforced with patient importance of doing edema control using few times a day MCP dynamic flexion splint as well as  flexion glove-  patient to use to facilitate increased MP flexion, PIP flexion as well as composite flexion in the mornings especially but also during the day to facilitate functional use-at this date PROM with light pressure on the  dorsal proximal phalanges to increase MCP flexion with elbow into composite flexion -patient is showing motor return in the hand but has to do passive range of motion and stretches in the morning as well as a few times during the day to facilitate increased functional use.  Patient able to grasp objects with 2nd through 5th digits, was able to do opposition and showing partial palmar abduction of the thumb.  Reviewed with patient home exercises.  Was able to facilitate some lumbrical flexion and PIP flexion rolling and pushing foam roller over putty as well as pushing lacrosse ball into putty and pulling it off.  Patient this date able to grasp 7 inch ball 5 seconds and release several times.  Did fabricate patient a custom antiulnar close splint to facilitate more placement of MCP 60 degrees and facilitate PIP extension during the day.  Provided factly medium for for lateral pinch and adduction of the thumb.  As well as different activities to facilitate palmar adduction of the thumb.  Needed min to mod assist for home exercises patient limited in functional use of right upper extremity in ADLs and IADLs.  Patient can benefit from skilled OT services to decrease pain and numbness increase motion and strength to return to prior level of function.  PERFORMANCE DEFICITS: in functional skills including ADLs, IADLs, coordination, dexterity, proprioception, sensation, edema, ROM, strength, pain, flexibility, Fine motor control, Gross motor control, decreased knowledge of precautions, and decreased knowledge of use of DME,  IMPAIRMENTS: are limiting patient from ADLs, IADLs, rest and sleep, work, play, leisure, and social participation.   COMORBIDITIES: may have co-morbidities  that affects occupational performance. Patient will benefit from skilled OT to address above impairments and improve overall function.  MODIFICATION OR ASSISTANCE TO COMPLETE EVALUATION: Min-Moderate modification of tasks or assist with  assess necessary to complete an evaluation.  OT OCCUPATIONAL PROFILE AND HISTORY: Detailed assessment: Review of records and additional review of physical, cognitive, psychosocial history related to current  functional performance.  CLINICAL DECISION MAKING: Moderate - several treatment options, min-mod task modification necessary  REHAB POTENTIAL: Good for goals  EVALUATION COMPLEXITY: Moderate      PLAN:  OT FREQUENCY: 1-2x/week  OT DURATION: 12 weeks  PLANNED INTERVENTIONS: 97168 OT Re-evaluation, 97535 self care/ADL training, 02889 therapeutic exercise, 97530 therapeutic activity, 97112 neuromuscular re-education, 97140 manual therapy, 97039 fluidotherapy, 97034 contrast bath, 97033 iontophoresis, passive range of motion, patient/family education, and DME and/or AE instructions    CONSULTED AND AGREED WITH PLAN OF CARE: Patient   Ancel Peters, OTR/L,CLT 02/21/2024, 3:49 PM

## 2024-02-24 ENCOUNTER — Ambulatory Visit: Admitting: Occupational Therapy

## 2024-02-24 DIAGNOSIS — M25641 Stiffness of right hand, not elsewhere classified: Secondary | ICD-10-CM

## 2024-02-24 DIAGNOSIS — M6281 Muscle weakness (generalized): Secondary | ICD-10-CM | POA: Diagnosis not present

## 2024-02-24 DIAGNOSIS — M79601 Pain in right arm: Secondary | ICD-10-CM

## 2024-02-24 DIAGNOSIS — M25631 Stiffness of right wrist, not elsewhere classified: Secondary | ICD-10-CM | POA: Diagnosis not present

## 2024-02-24 NOTE — Therapy (Signed)
 OUTPATIENT OCCUPATIONAL THERAPY ORTHO TREATMENT  Patient Name: Daniel Lawrence MRN: [id number] DOB:[birth year], 63 y.o., male Today's Date: 02/24/2024  PCP: Dr Marylynn REFERRING PROVIDER:Dr Valli  END OF SESSION:  OT End of Session - 02/24/24 2028     Visit Number 27    Number of Visits 30    Date for OT Re-Evaluation 02/25/24    OT Start Time 1603    OT Stop Time 1651    OT Time Calculation (min) 48 min    Activity Tolerance Patient tolerated treatment well    Behavior During Therapy [facility] for tasks assessed/performed           Past Medical History:  Diagnosis Date   Allergy    seasonal   Arthritis    left knee   Cancer (HCC)    skin cancer   Cataract    Hyperlipidemia    Hypertension    Past Surgical History:  Procedure Laterality Date   BRAIN SURGERY  1971   COLONOSCOPY  05/03/2021   ELEVATION OF DEPRESSED SKULL FRACTURE  07/16/1969   traumatic blow to head by golf club   , [facility])   MOHS SURGERY  09/14/2011   left temple, squamous   ROBOT ASSISTED LAPAROSCOPIC NEPHRECTOMY Right 04/20/2016   Procedure: XI ROBOTIC ASSISTED LAPAROSCOPIC RADICAL NEPHRECTOMY;  Surgeon: Ricardo Likens, MD;  Location: [facility];  Service: Urology;  Laterality: Right;   Patient Active Problem List   Diagnosis Date Noted   Olecranon bursitis, right elbow 02/11/2024   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 12/26/2023   Physical deconditioning 12/26/2023   Tubular adenoma of colon 05/29/2023   Hearing loss due to cerumen impaction, right 05/29/2023   AC (acromioclavicular) arthritis 02/27/2023   Increased prostate specific antigen (PSA) velocity 07/03/2022   Lipoma of right shoulder 05/21/2022   Low back pain 09/15/2021   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   Aortic atherosclerosis (HCC) 04/18/2021   Melanoma in situ of right upper arm (HCC) 04/18/2021   H/O right nephrectomy 04/17/2020   Degenerative arthritis of left knee 02/25/2018   Chronic pain of left knee  02/08/2018   CKD (chronic kidney disease) stage 3, GFR 30-59 ml/min (HCC) 02/08/2018   Coronary atherosclerosis due to lipid rich plaque 05/26/2016   History of renal cell carcinoma 04/20/2016   B12 deficiency 04/08/2016   Encounter for preventive health examination 06/24/2014   Hypertension 06/23/2014   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  And he feels good if I do my exercises and I start seeing wrinkles my fingers and more definition my hand. I Pt accompanied by: self  PERTINENT HISTORY: Patient had on 10/08/2023 by Dr. 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  Mostly at At medial elbow and wrist and hand. PRECAUTIONS: Patient to follow  surgeon's precautions    WEIGHT BEARING RESTRICTIONS: no  PAIN:  Are you having pain?  Denies nerve pain mostly at times shooting nerve pain or in the ulnar hand and fifth finger  FALLS: Has patient fallen in last 6 months? No  LIVING ENVIRONMENT: Lives with: lives with their spouse  PLOF: Patient prior to surgery had normal active range of motion and strength.  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  Working and teaching  yoga and doing things around the house  NEXT MD VISIT:?  OBJECTIVE:  Note: Objective measures were completed at Evaluation unless otherwise noted.  HAND DOMINANCE: Right  ADLs: Patient mostly limited by wrist and hand weakness.  Unable to grip objects and pick it up with weight as well as fine motor unable to pick up small objects.    UPPER EXTREMITY ROM at eval     Patient with decreased proprioception and coordination in right upper extremity range of motion.  Slow and deliberate. Pain and tenderness over right cubital tunnel Increased pain and tenderness over Guyon's canal As well as tenderness over carpal tunnel at volar wrist Bruising present over dorsal wrist. Patient reports he had some IVs in dorsal R wrist  And had a lot of swelling in R hand and wrist postop composite nerve glide and shoulder abduction had increased pain at stage 4 of 5.  Pain from elbow into wrist and hand.    Active ROM Right eval Left eval  Shoulder flexion    Shoulder abduction    Shoulder adduction    Shoulder extension    Shoulder internal rotation    Shoulder external rotation    Elbow flexion    Elbow extension    Wrist flexion 84   Wrist extension 64   Wrist ulnar deviation 20   Wrist radial deviation 25   Wrist pronation 90   Wrist supination 90      At eval  Extension or tapping of digits on right hand of table needs passive range of motion and placed on hold for second digit.  3rd through 5th 3 -/5 strength Lumbrical fist decreased PIP extension Intrinsic a fist within functional limits 3rd through 5th decrease in second digit Decrease strength for dorsal and volar interossei Composite fist three-quarter range PROM Opening of digits limited at PIP extension after composite fist.  Active ROM Right eval Left eval R 10/31/23 R 11/05/23 R/12/03/23 R 12/17/23 Place and hold  R 01/16/24  Thumb MCP (0-60) 35      48/ L 50  Thumb IP (0-80) 10      30 / L 70  Thumb Radial abd/add  (0-55) WFL     WFL  55 including EPB  Thumb Palmar abd/add (0-45) impaired     Place and hold 45  Able to do AAROM over ball   Thumb Opposition to Small Finger Unable to do any opposition even to second digit   Opposed to middle phalanges of second digit Pick up 2 cm foam block to 3rd      Index MCP (0-90)    75 80 80 80   Index PIP (0-100)    35 35 60 70   Index DIP (0-70)           Long MCP (0-90)     75 80 80 80  Long PIP (0-100)     60 65 65 70   Long DIP (0-70)           Ring MCP (0-90)     80 85 85 85   Ring PIP (0-100)     75 85 85 85   Ring DIP (0-70)           Little MCP (0-90)     80 90 90 90   Little PIP (0-100)     75 90 90 90   Little DIP (0-70)           (Blank rows = not tested)  HAND FUNCTION: Not tested evaluation  11/05/23 Grip strength: Right: 0 lbs; Left: 76 lbs, Lateral pinch: Right: 0 lbs, Left:   lbs, and 3 point pinch: Right: 0 lbs, Left:   lbs 02/03/24 Grip strength: Right: NT lbs; Left: 76 lbs, Lateral pinch: Right: 1 lbs, Left:   lbs, and 3 point pinch: Right: unable  lbs, Left:   lbs  COORDINATION: Impaired unable to make composite fist unable to do 2. Or 3 point pinch  SENSATION: Numbness in ulnar forearm into hand and fifth digit and ulnar side of fourth Report some pins-and-needles in DIP of third Patient with increased pain and tenderness over cubital tunnel as well as Guyon's canal.  Tenderness over volar wrist carpal tunnel  12/05/23: [place] [employer] was done.  Patient feel only deep pressure on ulnar side of distal upper arm elbow forearm and hand. Also not able to fill hot and cold. 12/20/23 Semmes-Weinstein this date on ulnar upper arm to mid forearm 4.56  consistent and delayed but able to identify 4.31  01/31/24 Semmes-Weinstein this date on ulnar upper arm to mid forearm 4.56  consistent and delayed but able to identify 4.31  02/13/24 Semmes-Weinstein this date on ulnar upper arm to 4.31;  proximal 2/3 forearm 4.56, deep pressure tat ulnar  DPC of  hand and 2.83 normal sensation thumb thru 4th   EDEMA: Patient report had severe edema postop in right hand and wrist.  Continue to have some bruising over her dorsal wrist  COGNITION: Overall cognitive status: WNL      TREATMENT DATE: 02/24/24      Patient to continue with home program for thoracic swelling patient continues to have some postop swelling on the right thoracic. Patient to wear light compression shirt provided patient with comprex foam to facilitate lymphatic flow horizontal from right AAA and PAA to the left Nighttime and also during the day at home.  Reviewed with patient again wearing it correctly Can also do some light MLD from her right AI to inguinal lymph nodes as well as right AAA to the left 20 reps each 2 times a day Reports surgeon told him the same thing about compression. Patient has compression sleeve for right elbow for bursa that was drained.  Provided him a Tubigrip D for nighttime for more comfort   Patient to continue with contrast/heat at home   SESSION TODAY  Patient arrived with increased flexibility in the PIPs.  Patient able to engage in Velcro board using PIP extension pushing Velcro 3 inch several times. Using PIP flexion unable to pull. Try to applied pressure later with PIP flexion. Lateral pinch with the thumb with second digit extended able to do key grip in combination with supination halfway through range.  6 reps Pulling up 2 inch Velcro block with loop able to engage 2nd, 3rd and 4th extension pulling off  with rotation.  Pinky unable  Fabricated MC flexion brace for nighttime use.  Done first flexion glove for a dynamic flexion stretch  digits flexion during 2 minutes heat Followed by MCP flexion dynamic splint (knuckle bender )And flexion glove to facilitate composite flexion.  2 to 3 minutes Finish MCP flexion brace.  With the elastic Velcro maintaining flexion at the MCP down to the dorsal wrist. Stabilization Velcro through  palm and circular around wrist. Patient educated in starting use 2 to 4 hours can increase to 6 to 8 hours.  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 patient weight-bear through elbow on pillow with applying through the knee able to do scapular retraction with elbow flexion at 90 alternating can add to home exercises 12 reps Prayer stretch 1 minute Plank through elbows and knee can do thoracic straight through and horizontal abduction alternating 12 reps Patient can do floor activities through the elbows with cushioning and do wall activities through the palm After doing wrist extension stretches                                                                                                                       PATIENT EDUCATION: Education details: Progressing changes in  HEP  Person educated: Patient Education method: Explanation, Demonstration, Tactile cues, Verbal cues, and Handouts Education comprehension: verbalized understanding, returned demonstration, verbal cues required, and needs further education    GOALS: Goals reviewed with patient? Yes  LONG TERM GOALS: Target date: 12 wks   Patient to be independent in home program to modify right upper extremity in ADLs and IADLs as well as positioning during the day and night to decrease irritation on cubital tunnel and volar wrist with decreased pain Baseline: Pain over the cubital tunnel and Guyon's canal 6-7/10.  With increased tenderness and pain increased numbness over her ulnar forearm wrist and hand into fifth and ulnar side of fourth digit Goal status: Met  2.  Patient to be  independent in home program to increase proprioception and ease  of right shoulder, elbow range of motion to normal reaching pattern  Baseline: Patient showed decreased coordination and proprioception in the reaching with the right upper extremity compensating with trunk and scapula Goal status: Met  3.  Right wrist and forearm strength increased to 4+ to initiate strengthening and carrying groceries less than 4 pounds within no increase symptoms Baseline: Active range of motion within functional limits for supination pronation  but decreased wrist extension/ flexion decreased strength in ulnar deviation Goal status: Progressing  4.  Digit active range of motion increased for patient to make composite fist and open hand to grasp around toothbrush and release,  put hand in pocket Baseline: Three-quarter of a fist.  Decreased PIP extension with a lumbrical fist.  Decreased intrinsic first and second digit unable to touch any fingertips with opposition.  Numbness on ulnar side of forearm into the wrist hand fifth digit and ulnar fourth Goal status: Progressing  5.  Patient to be independent home program to decrease pain and tenderness from elbow to hand to less than 2/10 Baseline: Pain 6-7/10 at cubital tunnel and volar wrist and Ganz canal Goal status progressing  6.  Thumb active range of motion improved to within functional limits to be able to do palmar abduction to grasp around 4 cm object as well as increased opposition to pick up medication Baseline: Patient lacking 1 to 2 cm for opposition to second digit.  Unable to do opposition to any digits.  Decreased palmar abduction.  Radial abduction 45 degrees Goal status: Progressing    ASSESSMENT:  CLINICAL IMPRESSION: Patient patient followed by OT for increased weakness, increased pain and numbness in right dominant upper extremity since after surgery.  Patient had on 10/08/2023 a R VATS nerve sheath tumor excision -and hx of nephrectomy .   Patient present with increased pain and tenderness over right cubital tunnel as well as Guyon canal and carpal tunnel Patient with decreased coordination and proprioception and range of motion of right shoulder and elbow in ADLs and IADLs.  Patient with decreased range of motion and strength in her right wrist ,digits and thumb.  Most weakness stiffness in thumb palmar abduction, digit flexion extension as well as interosseous.  NOW  Patient with increased range of motion and strength in right shoulder , elbow and forearm -and wrist is improving.  Patient able to do modified yoga poses.  Patient is a Marine scientist part-time.  Patient can do weightbearing through the elbow and the knees for modified plank with scapular and shoulder stabilization exercises.  On the wall after passive range of motion to the wrist patient can do weightbearing activities and proximal stability exercises through the palm on the wall.  Patient  with increased sensation in the right upper arm and elbow into the forearm -increase sensation in the digits including 5th and 4th.  Less nerve pain, Tinel or tenderness over the carpal tunnel.  Positive Tinel's at the ulnar nerve at [facility] with increased sensation in the digits.  Reinforced with patient importance of doing edema control using few times a day MCP dynamic flexion splint as well as  flexion glove-  patient to use to facilitate increased MP flexion, PIP flexion as well as composite flexion in the mornings especially but also during the day to facilitate functional use-at this date PROM with light pressure on the dorsal proximal phalanges to increase MCP flexion with elbow into composite flexion -patient is showing motor return in the hand but has to do passive range of motion and stretches in the morning as well as a few times during the day to facilitate increased functional use.  Patient able to grasp objects with 2nd through 5th digits, was able to do opposition and showing partial  palmar abduction of the thumb.  Reviewed with patient home exercises.    Provided factly medium for for lateral pinch and adduction of the thumb.  As well as different activities to facilitate palmar adduction of the thumb.  Primary care created custom MCP flexion brace for nighttime to increase MC flexion stretch to work gradually 2 to 4 hours then 6 to 8 hours for increased  MCP flexion to facilitate increased composite flexion for increased range of motion and strength.  Patient was educated in donning and doffing as well as wearing.  Demonstrated understanding.  Needed min to mod assist for home exercises patient limited in functional use of right upper extremity in ADLs and IADLs.  Patient can benefit from skilled OT services to decrease pain and numbness increase motion and strength to return to prior level of function.  PERFORMANCE DEFICITS: in functional skills including ADLs, IADLs, coordination, dexterity, proprioception, sensation, edema, ROM, strength, pain, flexibility, Fine motor control, Gross motor control, decreased knowledge of precautions, and decreased knowledge of use of DME,  IMPAIRMENTS: are limiting patient from ADLs, IADLs, rest and sleep, work, play, leisure, and social participation.   COMORBIDITIES: may have co-morbidities  that affects occupational performance. Patient will benefit from skilled OT to address above impairments and improve overall function.  MODIFICATION OR ASSISTANCE TO COMPLETE EVALUATION: Min-Moderate modification of tasks or assist with assess necessary to complete an evaluation.  OT OCCUPATIONAL PROFILE AND HISTORY: Detailed assessment: Review of records and additional review of physical, cognitive, psychosocial history related to current functional performance.  CLINICAL DECISION MAKING: Moderate - several treatment options, min-mod task modification necessary  REHAB POTENTIAL: Good for goals  EVALUATION COMPLEXITY: Moderate      PLAN:  OT  FREQUENCY: 1-2x/week  OT DURATION: 12 weeks  PLANNED INTERVENTIONS: 97168 OT Re-evaluation, 97535 self care/ADL training, 02889 therapeutic exercise, 97530 therapeutic activity, 97112 neuromuscular re-education, 97140 manual therapy, 97039 fluidotherapy, 97034 contrast bath, 97033 iontophoresis, passive range of motion, patient/family education, and DME and/or AE instructions    CONSULTED AND AGREED WITH PLAN OF CARE: Patient   Ancel Peters, OTR/L,CLT 02/24/2024, 8:30 PM

## 2024-02-26 NOTE — Therapy (Unsigned)
 OUTPATIENT OCCUPATIONAL THERAPY ORTHO TREATMENT/RECERT  Patient Name: Daniel Lawrence MRN: [id number] DOB:Dec 06, 1960, 63 y.o., male Today's Date: 03/02/2024  PCP: Dr Marylynn REFERRING PROVIDER:Dr Valli  END OF SESSION:  OT End of Session - 03/02/24 1345     Visit Number 28    Number of Visits 40    Date for OT Re-Evaluation 05/21/24    OT Start Time 0901    OT Stop Time 0952    OT Time Calculation (min) 51 min    Activity Tolerance Patient tolerated treatment well    Behavior During Therapy [facility] for tasks assessed/performed             Past Medical History:  Diagnosis Date   Allergy    seasonal   Arthritis    left knee   Cancer (HCC)    skin cancer   Cataract    Hyperlipidemia    Hypertension    Past Surgical History:  Procedure Laterality Date   BRAIN SURGERY  1971   COLONOSCOPY  05/03/2021   ELEVATION OF DEPRESSED SKULL FRACTURE  07/16/1969   traumatic blow to head by golf club   , [facility])   MOHS SURGERY  09/14/2011   left temple, squamous   ROBOT ASSISTED LAPAROSCOPIC NEPHRECTOMY Right 04/20/2016   Procedure: XI ROBOTIC ASSISTED LAPAROSCOPIC RADICAL NEPHRECTOMY;  Surgeon: Ricardo Likens, MD;  Location: [facility];  Service: Urology;  Laterality: Right;   Patient Active Problem List   Diagnosis Date Noted   Olecranon bursitis, right elbow 02/11/2024   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 12/26/2023   Physical deconditioning 12/26/2023   Tubular adenoma of colon 05/29/2023   Hearing loss due to cerumen impaction, right 05/29/2023   AC (acromioclavicular) arthritis 02/27/2023   Increased prostate specific antigen (PSA) velocity 07/03/2022   Lipoma of right shoulder 05/21/2022   Low back pain 09/15/2021   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   Aortic atherosclerosis (HCC) 04/18/2021   Melanoma in situ of right upper arm (HCC) 04/18/2021   H/O right nephrectomy 04/17/2020   Degenerative arthritis of left knee 02/25/2018   Chronic pain of  left knee 02/08/2018   CKD (chronic kidney disease) stage 3, GFR 30-59 ml/min (HCC) 02/08/2018   Coronary atherosclerosis due to lipid rich plaque 05/26/2016   History of renal cell carcinoma 04/20/2016   B12 deficiency 04/08/2016   Encounter for preventive health examination 06/24/2014   Hypertension 06/23/2014   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  And it feels good if I do my exercises and I start seeing wrinkles my fingers and more definition my hand. Starting to use it little with driving- and do have to spend so much time on heat to get some bending in my fingers - more feeling in my pinkie  I Pt accompanied by: self  PERTINENT HISTORY: Patient had on 10/08/2023 by Dr. 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  Mostly at At medial elbow and wrist and hand. PRECAUTIONS: Patient to follow surgeon's precautions    WEIGHT BEARING RESTRICTIONS: no  PAIN:  Are you having pain?  Denies nerve pain mostly at times shooting nerve pain or in the ulnar hand and fifth finger  FALLS: Has patient fallen in last 6 months? No  LIVING ENVIRONMENT: Lives with: lives with their spouse  PLOF: Patient prior to surgery had normal active range of motion and strength.  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  Working and teaching yoga and doing things around the house  NEXT MD VISIT:?  OBJECTIVE:  Note: Objective measures were completed at Evaluation unless otherwise noted.  HAND DOMINANCE: Right  ADLs: Patient mostly limited by wrist and hand weakness.  Unable to grip objects and pick it up with weight as well as fine motor unable to pick up small objects.    UPPER EXTREMITY ROM at eval     Patient with decreased proprioception and coordination in right upper extremity range of motion.  Slow and deliberate. Pain and tenderness over right cubital tunnel Increased pain and tenderness over Guyon's canal As well as tenderness over carpal tunnel at volar wrist Bruising present over dorsal wrist. Patient reports he had some IVs in dorsal R wrist  And had a lot of swelling in R hand and wrist postop composite nerve glide and shoulder abduction had increased pain at stage 4 of 5.  Pain from elbow into wrist and hand.    Active ROM Right eval Left eval  Shoulder flexion    Shoulder abduction    Shoulder adduction    Shoulder extension    Shoulder internal rotation    Shoulder external rotation    Elbow flexion    Elbow extension    Wrist flexion 84   Wrist extension 64   Wrist ulnar deviation 20   Wrist radial deviation 25   Wrist pronation 90   Wrist supination 90      At eval  Extension or tapping of digits on right hand of table needs passive range of motion and placed on hold for second digit.  3rd through 5th 3 -/5 strength Lumbrical fist decreased PIP extension Intrinsic a fist within functional limits 3rd through 5th decrease in second digit Decrease strength for dorsal and volar interossei Composite fist three-quarter range PROM Opening of digits limited at PIP extension after composite fist.  02/27/24:    Active ROM Right eval  Left eval R 10/31/23 R 11/05/23 R/12/03/23 R 12/17/23 Place and hold  R 01/16/24  Thumb MCP (0-60) 35      48/ L 50  Thumb IP (0-80) 10      30 / L 70  Thumb Radial abd/add (0-55) WFL     WFL  55 including EPB  Thumb Palmar abd/add (0-45) impaired     Place and hold 45  Able to do AAROM over ball   Thumb Opposition to Small Finger Unable to do any opposition even to second digit   Opposed to middle phalanges of second digit Pick up 2 cm foam block to 3rd      Index MCP (0-90)    75 80 80 80  Index PIP (0-100)    35 35 60 70   Index DIP (0-70)           Long MCP (0-90)     75 80 80 80   Long PIP (0-100)     60 65 65 70   Long DIP (0-70)           Ring MCP (0-90)     80 85 85 85   Ring PIP (0-100)     75 85 85 85   Ring DIP (0-70)           Little MCP (0-90)     80 90 90 90   Little PIP (0-100)     75 90 90 90   Little DIP (0-70)           (Blank rows = not tested)  HAND FUNCTION: Not tested evaluation  11/05/23 Grip strength: Right: 0 lbs; Left: 76 lbs, Lateral pinch: Right: 0 lbs, Left:   lbs, and 3 point pinch: Right: 0 lbs, Left:   lbs 02/03/24 Grip strength: Right: NT lbs; Left: 76 lbs, Lateral pinch: Right: 1 lbs, Left:   lbs, and 3 point pinch: Right: unable  lbs, Left:   lbs  COORDINATION: Impaired unable to make composite fist unable to do 2. Or 3 point pinch  SENSATION: Numbness in ulnar forearm into hand and fifth digit and ulnar side of fourth Report some pins-and-needles in DIP of third Patient with increased pain and tenderness over cubital tunnel as well as Guyon's canal.  Tenderness over volar wrist carpal tunnel  12/05/23: Gust[place] Speed was done.  Patient feel only deep pressure on ulnar side of distal upper arm elbow forearm and hand. Also not able to fill hot and cold. 12/20/23 Semmes-Weinstein this date on ulnar upper arm to mid forearm 4.56  consistent and delayed but able to identify 4.31  01/31/24 Semmes-Weinstein this date on ulnar upper arm to mid forearm 4.56   consistent and delayed but able to identify 4.31  02/13/24 Semmes-Weinstein this date on ulnar upper arm to 4.31;  proximal 2/3 forearm 4.56, deep pressure tat ulnar  DPC of hand and 2.83 normal sensation thumb thru 4th  02/27/24 Semmes-Weinstein this date on ulnar upper arm to  elbow and proximal 1/3 of forearm 4.31;  distal 2/3 forearm 4.56, deep pressure tat ulnar  DPC of hand and 2.83 normal sensation thumb thru 4th ; 4.56 on 5th   Positive Tinel DPC base of 5th   EDEMA: Patient report had severe edema postop in right hand and wrist.  Continue to have some bruising over her dorsal wrist  COGNITION: Overall cognitive status: WNL      TREATMENT DATE: 02/27/24      Patient to continue with home program for thoracic swelling patient continues to have some postop swelling on the right thoracic. Patient to wear light compression shirt provided patient with comprex foam to facilitate lymphatic flow horizontal from right AAA and PAA to the left Nighttime and also during the day at home.  Reviewed with patient again wearing it correctly Can also do some light MLD from her right AI to inguinal lymph nodes as well as right AAA to the left 20 reps each 2 times a day Reports surgeon told him the same thing about compression. Patient has compression sleeve for right elbow for bursa that was drained.  Provided him a Tubigrip D for nighttime for more comfort   Patient to continue with contrast/heat  at home as needed to increase PROM in R hand RE CERT measurements :   R shoulder abduction and external rotation 4 -/5 Elbow flexion extension within normal limits Wrist flexion 4 FCU and extension for ECU 4 -/5 Pronation and supination 4 -/5  Opposition to middle phalanges of second and able to pick up 3 cm up check with opposition to 3rd through 5th although lateral side Thumb radial abduction 55 -4 -/5  palmar abduction with gravity 40 degrees --Unable to take any resistance for palmar abduction  2+/5 Thumb IP flexion 40 and MCP 48 degrees  PROM to digits-MCPs 2nd through 5th as follow 65, 60, 60, 55 degrees PIP flexion for second 3/5 as follow 50, 75, 75, 70 Patient the last 2 weeks able to initiate grasping lacrosse ball or size of a golf ball pushing into putty pulling it off if less than 2 seconds push Initiating some digit extension with Velcro board and into the putty light blue  Patient to continue with MC flexion brace for nighttime use.  Fabricated last time.  To wear at nighttime. MCP flexion brace.  With the elastic Velcro maintaining flexion at the MCP down to the dorsal wrist. Stabilization Velcro through palm and circular around wrist. Patient educated in starting use 2 to 4 hours can increase to 6 to 8 hours.  To facilitate more MC flexion    HEP: Upgrade patient's home program using flexion glove around flex bar yellow easy focusing on supination and pronation as well as ulnar deviation incorporating grip with flexion glove 2 sets of 12. 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 patient weight-bear through elbow on pillow with applying through the knee able to do scapular retraction with elbow flexion at 90 alternating can add to home exercises 12 reps Prayer stretch 1 minute Plank through elbows and knee can do thoracic straight through and horizontal abduction alternating 12 reps Patient can do floor activities through the elbows with cushioning and do wall activities through the palm After doing wrist extension stretches                                                                                                                        PATIENT EDUCATION: Education details: Progressing changes in  HEP  Person educated: Patient Education method: Explanation, Demonstration, Tactile cues, Verbal cues, and Handouts Education comprehension: verbalized understanding, returned demonstration, verbal cues required, and needs further education    GOALS: Goals reviewed with patient? Yes  LONG TERM GOALS: Target date: 12 wks   Patient to be independent in home program to modify right upper extremity in ADLs and IADLs as well as positioning during  the day and night to decrease irritation on cubital tunnel and volar wrist with decreased pain Baseline: Pain over the cubital tunnel and Guyon's canal 6-7/10.  With increased tenderness and pain increased numbness over her ulnar forearm wrist and hand into fifth and ulnar side of fourth digit Goal status: Met  2.  Patient to be independent in home program to increase proprioception and ease  of right shoulder, elbow range of motion to normal reaching pattern  Baseline: Patient showed decreased coordination and proprioception in the reaching with the right upper extremity compensating with trunk and scapula Goal status: Met  3.  Right wrist and forearm strength increased to 4+ to initiate strengthening and carrying groceries less than 4 pounds within no increase symptoms Baseline: Active range of motion within functional limits for supination pronation  but decreased wrist extension/ flexion decreased strength in ulnar deviation Goal status: Progressing  4.  Digit active range of motion increased for patient to make composite fist and open hand to grasp around toothbrush and release, put hand in pocket Baseline: Three-quarter of a fist.  Decreased PIP extension with a lumbrical fist.  Decreased intrinsic first and second digit unable to touch any fingertips with opposition.  Numbness on ulnar side of forearm into the wrist hand fifth digit and ulnar  fourth Goal status: Progressing  5.  Patient to be independent home program to decrease pain and tenderness from elbow to hand to less than 2/10 Baseline: Pain 6-7/10 at cubital tunnel and volar wrist and Guyons canal- NOW  Goal status progressing  6.  Thumb active range of motion improved to within functional limits to be able to do palmar abduction to grasp around 4 cm object as well as increased opposition to pick up medication Baseline: Patient lacking 1 to 2 cm for opposition to second digit.  Unable to do opposition to any digits.  Decreased palmar abduction.  Radial abduction 45 degrees Goal status: Progressing    ASSESSMENT:  CLINICAL IMPRESSION: Patient patient followed by OT for increased weakness, increased pain and numbness in right dominant upper extremity since after surgery.  Patient had on 10/08/2023 a R VATS nerve sheath tumor excision -and hx of nephrectomy .  Patient present with increased pain and tenderness over right cubital tunnel as well as Guyon canal and carpal tunnel Patient with decreased coordination and proprioception and range of motion of right shoulder and elbow in ADLs and IADLs.  Patient with decreased range of motion and strength in her right wrist ,digits and thumb.  Most weakness stiffness in thumb palmar abduction, digit flexion extension as well as interosseous.  NOW patient showed great progress from start of care.  Shoulder, elbow strength within functional limits.  Patient decreased strength in shoulder abduction external rotation but within functional limits.  Wrist and forearm strength within functional limits except decreased strength especially at ECU and FCU.  Upgraded patient's home program today using flexion glove around flex bar for wrist ulnar deviation as well as supination pronation and wrist flexion extension.  Patient able the last 2 to 3 weeks to initiate some finger flexion and extension against resistance.  Initiating more grasping large  objects like a lacrosse ball or golf ball pulling and pushing into putty.  Radial abduction within functional limits palmar abduction 2+/5 strength.  Opposition to middle phalanges of second digit.  And able to grasp 3 cm object with opposition to 3rd through 5th.  MCP flexion improved to 55 to 65 degrees of flexion and  PIP to 50-75 degrees.  Patient continues to show improvement on [place] Speed with sensation.  See note above.  Patient can benefit from skilled OT services to continue to work on increased passive range of motion and active range of motion in right hand to facilitate increased functional use.  As sensation and motor improved with nerve healing.  Await repeat of nerve conduction test in the near future.  To compare to last 1.  Needed min to mod assist for home exercises patient limited in functional use of right upper extremity in ADLs and IADLs.  Patient can benefit from skilled OT services to decrease pain and numbness increase motion and strength to return to prior level of function.  PERFORMANCE DEFICITS: in functional skills including ADLs, IADLs, coordination, dexterity, proprioception, sensation, edema, ROM, strength, pain, flexibility, Fine motor control, Gross motor control, decreased knowledge of precautions, and decreased knowledge of use of DME,  IMPAIRMENTS: are limiting patient from ADLs, IADLs, rest and sleep, work, play, leisure, and social participation.   COMORBIDITIES: may have co-morbidities  that affects occupational performance. Patient will benefit from skilled OT to address above impairments and improve overall function.  MODIFICATION OR ASSISTANCE TO COMPLETE EVALUATION: Min-Moderate modification of tasks or assist with assess necessary to complete an evaluation.  OT OCCUPATIONAL PROFILE AND HISTORY: Detailed assessment: Review of records and additional review of physical, cognitive, psychosocial history related to current functional performance.  CLINICAL  DECISION MAKING: Moderate - several treatment options, min-mod task modification necessary  REHAB POTENTIAL: Good for goals  EVALUATION COMPLEXITY: Moderate      PLAN:  OT FREQUENCY: 1-2x/week  OT DURATION: 12 weeks  PLANNED INTERVENTIONS: 97168 OT Re-evaluation, 97535 self care/ADL training, 02889 therapeutic exercise, 97530 therapeutic activity, 97112 neuromuscular re-education, 97140 manual therapy, 97039 fluidotherapy, 97034 contrast bath, 97033 iontophoresis, passive range of motion, patient/family education, and DME and/or AE instructions    CONSULTED AND AGREED WITH PLAN OF CARE: Patient   Ancel Peters, OTR/L,CLT 03/02/2024, 1:45 PM

## 2024-02-27 ENCOUNTER — Ambulatory Visit: Admitting: Occupational Therapy

## 2024-02-27 DIAGNOSIS — M79601 Pain in right arm: Secondary | ICD-10-CM

## 2024-02-27 DIAGNOSIS — M6281 Muscle weakness (generalized): Secondary | ICD-10-CM

## 2024-02-27 DIAGNOSIS — M25641 Stiffness of right hand, not elsewhere classified: Secondary | ICD-10-CM

## 2024-02-27 DIAGNOSIS — M25631 Stiffness of right wrist, not elsewhere classified: Secondary | ICD-10-CM | POA: Diagnosis not present

## 2024-03-02 NOTE — Addendum Note (Signed)
 Addended by: ANCEL PETERS on: 03/02/2024 01:49 PM   Modules accepted: Orders

## 2024-03-03 DIAGNOSIS — L814 Other melanin hyperpigmentation: Secondary | ICD-10-CM | POA: Diagnosis not present

## 2024-03-03 DIAGNOSIS — L821 Other seborrheic keratosis: Secondary | ICD-10-CM | POA: Diagnosis not present

## 2024-03-05 ENCOUNTER — Ambulatory Visit: Admitting: Occupational Therapy

## 2024-03-05 DIAGNOSIS — M6281 Muscle weakness (generalized): Secondary | ICD-10-CM | POA: Diagnosis not present

## 2024-03-05 DIAGNOSIS — M79601 Pain in right arm: Secondary | ICD-10-CM

## 2024-03-05 DIAGNOSIS — M25631 Stiffness of right wrist, not elsewhere classified: Secondary | ICD-10-CM | POA: Diagnosis not present

## 2024-03-05 DIAGNOSIS — M25641 Stiffness of right hand, not elsewhere classified: Secondary | ICD-10-CM

## 2024-03-05 NOTE — Therapy (Signed)
 OUTPATIENT OCCUPATIONAL THERAPY ORTHO TREATMENT  Patient Name: Daniel Lawrence MRN: [id number] DOB:1960-08-30, 63 y.o., male Today's Date: 03/05/2024  PCP: Dr Marylynn REFERRING PROVIDER:Dr Valli  END OF SESSION:  OT End of Session - 03/05/24 0900     Visit Number 29    Number of Visits 40    Date for OT Re-Evaluation 05/21/24    OT Start Time 0900    OT Stop Time 0946    OT Time Calculation (min) 46 min    Activity Tolerance Patient tolerated treatment well    Behavior During Therapy [facility] for tasks assessed/performed             Past Medical History:  Diagnosis Date   Allergy    seasonal   Arthritis    left knee   Cancer (HCC)    skin cancer   Cataract    Hyperlipidemia    Hypertension    Past Surgical History:  Procedure Laterality Date   BRAIN SURGERY  1971   COLONOSCOPY  05/03/2021   ELEVATION OF DEPRESSED SKULL FRACTURE  07/16/1969   traumatic blow to head by golf club   , 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 12/26/2023   Physical deconditioning 12/26/2023   Tubular adenoma of colon 05/29/2023   Hearing loss due to cerumen impaction, right 05/29/2023   AC (acromioclavicular) arthritis 02/27/2023   Increased prostate specific antigen (PSA) velocity 07/03/2022   Lipoma of right shoulder 05/21/2022   Low back pain 09/15/2021   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   Aortic atherosclerosis (HCC) 04/18/2021   Melanoma in situ of right upper arm (HCC) 04/18/2021   H/O right nephrectomy 04/17/2020   Degenerative arthritis of left knee 02/25/2018   Chronic pain of left  knee 02/08/2018   CKD (chronic kidney disease) stage 3, GFR 30-59 ml/min (HCC) 02/08/2018   Coronary atherosclerosis due to lipid rich plaque 05/26/2016   History of renal cell carcinoma 04/20/2016   B12 deficiency 04/08/2016   Encounter for preventive health examination 06/24/2014   Hypertension 06/23/2014   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  Last night I took it off earlier.  With working my fingers I can get a close to my palm now.  And I can see more wrinkles my fingers.  I do feel this funny feeling if I pinned my head to the left then I feel this sensation in my pinky. I Pt accompanied by: self  PERTINENT HISTORY: Patient had on 10/08/2023 by Dr. 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  Mostly at At medial elbow and wrist and hand. PRECAUTIONS: Patient to follow surgeon's precautions    WEIGHT BEARING RESTRICTIONS: no  PAIN:  Are you having pain?  Denies nerve pain mostly at times shooting nerve pain or in the ulnar hand and fifth finger  FALLS: Has patient fallen in last 6 months? No  LIVING ENVIRONMENT: Lives with: lives with their spouse  PLOF: Patient prior to surgery had normal active range of motion and strength.  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  Working and teaching yoga and doing things around the house  NEXT MD VISIT:?  OBJECTIVE:  Note: Objective measures were completed at Evaluation unless otherwise noted.  HAND DOMINANCE: Right  ADLs: Patient mostly limited by wrist and hand weakness.  Unable to grip objects and pick it up with weight as well as fine motor unable to pick up small objects.    UPPER EXTREMITY ROM at eval     Patient with decreased proprioception and coordination in right upper extremity range of motion.  Slow and deliberate. Pain and tenderness over right cubital tunnel Increased pain and tenderness over Guyon's canal As well as tenderness over carpal tunnel at volar wrist Bruising present over dorsal wrist. Patient reports he had some IVs in dorsal R wrist  And had a lot of swelling in R hand and wrist postop composite nerve glide and shoulder abduction had increased pain at stage 4 of 5.  Pain from elbow into wrist and hand.    Active ROM Right eval Left eval  Shoulder flexion    Shoulder abduction    Shoulder adduction    Shoulder extension    Shoulder internal rotation    Shoulder external rotation    Elbow flexion    Elbow extension    Wrist flexion 84   Wrist extension 64   Wrist ulnar deviation 20   Wrist radial deviation 25   Wrist pronation 90   Wrist supination 90      At eval  Extension or tapping of digits on right hand of table needs passive range of motion and placed on hold for second digit.  3rd through 5th 3 -/5 strength Lumbrical fist decreased PIP extension Intrinsic a fist within functional limits 3rd through 5th decrease in second digit Decrease strength for dorsal and volar interossei Composite fist three-quarter range PROM Opening of digits limited at PIP extension after composite fist.  02/27/24:    Active ROM Right eval Left eval R 10/31/23 R 11/05/23 R/12/03/23  R 12/17/23 Place and hold  R 01/16/24  Thumb MCP (0-60) 35      48/ L 50  Thumb IP (0-80) 10      30 / L 70  Thumb Radial abd/add (0-55) WFL     WFL  55 including EPB  Thumb Palmar abd/add (0-45) impaired     Place and hold 45  Able to do AAROM over ball   Thumb Opposition to Small Finger Unable to do any opposition even to second digit   Opposed to middle phalanges of second digit Pick up 2 cm foam block to 3rd      Index MCP (0-90)    75 80 80 80   Index PIP (0-100)  35 35 60 70   Index DIP (0-70)           Long MCP (0-90)     75 80 80 80   Long PIP (0-100)     60 65 65 70   Long DIP (0-70)           Ring MCP (0-90)     80 85 85 85   Ring PIP (0-100)     75 85 85 85   Ring DIP (0-70)           Little MCP (0-90)     80 90 90 90   Little PIP (0-100)     75 90 90 90   Little DIP (0-70)           (Blank rows = not tested)  HAND FUNCTION: Not tested evaluation  11/05/23 Grip strength: Right: 0 lbs; Left: 76 lbs, Lateral pinch: Right: 0 lbs, Left:   lbs, and 3 point pinch: Right: 0 lbs, Left:   lbs 02/03/24 Grip strength: Right: NT lbs; Left: 76 lbs, Lateral pinch: Right: 1 lbs, Left:   lbs, and 3 point pinch: Right: unable  lbs, Left:   lbs  COORDINATION: Impaired unable to make composite fist unable to do 2. Or 3 point pinch  SENSATION: Numbness in ulnar forearm into hand and fifth digit and ulnar side of fourth Report some pins-and-needles in DIP of third Patient with increased pain and tenderness over cubital tunnel as well as Guyon's canal.  Tenderness over volar wrist carpal tunnel  12/05/23: [place] Speed was done.  Patient feel only deep pressure on ulnar side of distal upper arm elbow forearm and hand. Also not able to fill hot and cold. 12/20/23 Semmes-Weinstein this date on ulnar upper arm to mid forearm 4.56  consistent and delayed but able to identify 4.31  01/31/24 Semmes-Weinstein this date on ulnar upper arm to mid forearm 4.56  consistent and delayed but able to identify  4.31  02/13/24 Semmes-Weinstein this date on ulnar upper arm to 4.31;  proximal 2/3 forearm 4.56, deep pressure tat ulnar  DPC of hand and 2.83 normal sensation thumb thru 4th  02/27/24 Semmes-Weinstein this date on ulnar upper arm to  elbow and proximal 1/3 of forearm 4.31;  distal 2/3 forearm 4.56, deep pressure tat ulnar  DPC of hand and 2.83 normal sensation thumb thru 4th ; 4.56 on 5th   Positive Tinel DPC base of 5th   EDEMA: Patient report had severe edema postop in right hand and wrist.  Continue to have some bruising over her dorsal wrist  COGNITION: Overall cognitive status: WNL      TREATMENT DATE: 02/27/24      RE CERT measurements 02/27/24 :   R shoulder abduction and external rotation 4 -/5 Elbow flexion extension within normal limits Wrist flexion 4 FCU and extension for ECU 4 -/5 Pronation and supination 4 -/5  Opposition to middle phalanges of second and able to pick up 3 cm up check with opposition to 3rd through 5th although lateral side Thumb radial abduction 55 -4 -/5  palmar abduction with gravity 40 degrees --Unable to take any resistance for palmar abduction 2+/5 Thumb IP flexion 40 and MCP 48 degrees  PROM to digits-MCPs 2nd through 5th as follow 65, 60, 60, 55 degrees PIP flexion for second 3/5 as follow 50, 75, 75, 70 Patient the last 2 weeks able to initiate grasping lacrosse ball or size of a golf ball pushing  into putty pulling it off if less than 2 seconds push Initiating some digit extension with Velcro board and into the putty light blue  Session today :  Working on PA of thumb without resistance - interlock and twirler thumb 10 reps    Patient to continue with MC flexion brace for nighttime use.  Fabricated last time.  To wear at nighttime. MCP flexion brace.  With the elastic Velcro maintaining flexion at the MCP down to the dorsal wrist. Stabilization Velcro through palm and circular around wrist. Patient educated in starting use 2 to 4 hours  can increase to 6 to 8 hours.  To facilitate more MC flexion    HEP: Upgrade patient's home program using flexion glove around flex bar yellow easy focusing on supination and pronation as well as ulnar deviation incorporating grip with flexion glove 2 sets of 12. 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 patient weight-bear through elbow on pillow with applying through the knee able to do scapular retraction with elbow flexion at 90 alternating can add to home exercises 12 reps Prayer stretch 1 minute Plank through elbows and knee can do thoracic straight through and horizontal abduction alternating 12 reps Patient can do floor activities through the elbows with cushioning and do wall activities through the palm After doing wrist extension stretches                                                                                                                       PATIENT EDUCATION: Education details: Progressing changes in  HEP  Person educated: Patient Education method: Explanation, Demonstration, Tactile cues, Verbal cues, and Handouts Education comprehension: verbalized understanding, returned demonstration, verbal cues required, and needs further education    GOALS: Goals reviewed with patient? Yes  LONG TERM GOALS: Target date: 12 wks   Patient to be independent in home program to modify right upper extremity in ADLs and IADLs as well as positioning during the day and night to decrease irritation on cubital tunnel and volar wrist with decreased pain Baseline: Pain over the cubital tunnel and Guyon's canal  6-7/10.  With increased tenderness and pain increased numbness over her ulnar forearm wrist and hand into fifth and ulnar side of fourth digit Goal status: Met  2.  Patient to be independent in home program to increase proprioception and ease  of right shoulder, elbow range of motion to normal reaching pattern  Baseline: Patient showed decreased coordination and proprioception in the reaching with the right upper extremity compensating with trunk and scapula Goal status: Met  3.  Right wrist and forearm strength increased to 4+ to initiate strengthening and carrying groceries less than  4 pounds within no increase symptoms Baseline: Active range of motion within functional limits for supination pronation  but decreased wrist extension/ flexion decreased strength in ulnar deviation Goal status: Progressing  4.  Digit active range of motion increased for patient to make composite fist and open hand to grasp around toothbrush and release, put hand in pocket Baseline: Three-quarter of a fist.  Decreased PIP extension with a lumbrical fist.  Decreased intrinsic first and second digit unable to touch any fingertips with opposition.  Numbness on ulnar side of forearm into the wrist hand fifth digit and ulnar fourth Goal status: Progressing  5.  Patient to be independent home program to decrease pain and tenderness from elbow to hand to less than 2/10 Baseline: Pain 6-7/10 at cubital tunnel and volar wrist and Guyons canal- NOW  Goal status progressing  6.  Thumb active range of motion improved to within functional limits to be able to do palmar abduction to grasp around 4 cm object as well as increased opposition to pick up medication Baseline: Patient lacking 1 to 2 cm for opposition to second digit.  Unable to do opposition to any digits.  Decreased palmar abduction.  Radial abduction 45 degrees Goal status: Progressing    ASSESSMENT:  CLINICAL IMPRESSION: Patient patient followed by OT for  increased weakness, increased pain and numbness in right dominant upper extremity since after surgery.  Patient had on 10/08/2023 a R VATS nerve sheath tumor excision -and hx of nephrectomy .  Patient present with increased pain and tenderness over right cubital tunnel as well as Guyon canal and carpal tunnel Patient with decreased coordination and proprioception and range of motion of right shoulder and elbow in ADLs and IADLs.  Patient with decreased range of motion and strength in her right wrist ,digits and thumb.  Most weakness stiffness in thumb palmar abduction, digit flexion extension as well as interosseous.  NOW focused on external rotation side-lying today 2 pound weight as well as against gravity ulnar deviation with elbow extension.  Patient has to focus maintaining neutral.  As well as moderate workout with burning in the upper arm with external rotation.  Since using MC flexion night splint patient with increased passive range of motion to palm this date with less resistance.  Was able to initiate resistance for digit extension today.  Needed min to mod assist for home exercises patient limited in functional use of right upper extremity in ADLs and IADLs.  Patient can benefit from skilled OT services to decrease pain and numbness increase motion and strength to return to prior level of function.  PERFORMANCE DEFICITS: in functional skills including ADLs, IADLs, coordination, dexterity, proprioception, sensation, edema, ROM, strength, pain, flexibility, Fine motor control, Gross motor control, decreased knowledge of precautions, and decreased knowledge of use of DME,  IMPAIRMENTS: are limiting patient from ADLs, IADLs, rest and sleep, work, play, leisure, and social participation.   COMORBIDITIES: may have co-morbidities  that affects occupational performance. Patient will benefit from skilled OT to address above impairments and improve overall function.  MODIFICATION OR ASSISTANCE TO COMPLETE  EVALUATION: Min-Moderate modification of tasks or assist with assess necessary to complete an evaluation.  OT OCCUPATIONAL PROFILE AND HISTORY: Detailed assessment: Review of records and additional review of physical, cognitive, psychosocial history related to current functional performance.  CLINICAL DECISION MAKING: Moderate - several treatment options, min-mod task modification necessary  REHAB POTENTIAL: Good for goals  EVALUATION COMPLEXITY: Moderate      PLAN:  OT FREQUENCY:  1-2x/week  OT DURATION: 12 weeks  PLANNED INTERVENTIONS: 97168 OT Re-evaluation, 97535 self care/ADL training, 02889 therapeutic exercise, 97530 therapeutic activity, 97112 neuromuscular re-education, 97140 manual therapy, 97039 fluidotherapy, 97034 contrast bath, 97033 iontophoresis, passive range of motion, patient/family education, and DME and/or AE instructions    CONSULTED AND AGREED WITH PLAN OF CARE: Patient   Ancel Peters, OTR/L,CLT 03/05/2024, 9:54 AM

## 2024-03-10 ENCOUNTER — Ambulatory Visit: Admitting: Occupational Therapy

## 2024-03-10 DIAGNOSIS — M6281 Muscle weakness (generalized): Secondary | ICD-10-CM | POA: Diagnosis not present

## 2024-03-10 DIAGNOSIS — M25631 Stiffness of right wrist, not elsewhere classified: Secondary | ICD-10-CM

## 2024-03-10 DIAGNOSIS — M25641 Stiffness of right hand, not elsewhere classified: Secondary | ICD-10-CM

## 2024-03-10 DIAGNOSIS — M79601 Pain in right arm: Secondary | ICD-10-CM

## 2024-03-10 NOTE — Therapy (Signed)
 OUTPATIENT OCCUPATIONAL THERAPY ORTHO TREATMENT/30 visit - done RECERT 2 visits ago  Patient Name: Daniel Lawrence MRN: [id number] DOB:[birth year], 63 y.o., male Today's Date: 03/10/2024  PCP: Dr Marylynn REFERRING PROVIDER:Dr Valli  END OF SESSION:  OT End of Session - 03/10/24 1619     Visit Number 30    Number of Visits 40    Date for OT Re-Evaluation 05/21/24    OT Start Time 1620    Activity Tolerance Patient tolerated treatment well    Behavior During Therapy [facility] for tasks assessed/performed             Past Medical History:  Diagnosis Date   Allergy    seasonal   Arthritis    left knee   Cancer (HCC)    skin cancer   Cataract    Hyperlipidemia    Hypertension    Past Surgical History:  Procedure Laterality Date   BRAIN SURGERY  1971   COLONOSCOPY  05/03/2021   ELEVATION OF DEPRESSED SKULL FRACTURE  07/16/1969   traumatic blow to head by golf club   , [facility])   MOHS SURGERY  09/14/2011   left temple, squamous   ROBOT ASSISTED LAPAROSCOPIC NEPHRECTOMY Right 04/20/2016   Procedure: XI ROBOTIC ASSISTED LAPAROSCOPIC RADICAL NEPHRECTOMY;  Surgeon: Ricardo Likens, MD;  Location: [facility];  Service: Urology;  Laterality: Right;   Patient Active Problem List   Diagnosis Date Noted   Olecranon bursitis, right elbow 02/11/2024   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 12/26/2023   Physical deconditioning 12/26/2023   Tubular adenoma of colon 05/29/2023   Hearing loss due to cerumen impaction, right 05/29/2023   AC (acromioclavicular) arthritis 02/27/2023   Increased prostate specific antigen (PSA) velocity 07/03/2022   Lipoma of right shoulder 05/21/2022   Low back pain 09/15/2021   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   Aortic atherosclerosis (HCC) 04/18/2021   Melanoma in situ of right upper arm (HCC) 04/18/2021   H/O right nephrectomy 04/17/2020   Degenerative arthritis of left knee 02/25/2018   Chronic pain of left knee 02/08/2018   CKD  (chronic kidney disease) stage 3, GFR 30-59 ml/min (HCC) 02/08/2018   Coronary atherosclerosis due to lipid rich plaque 05/26/2016   History of renal cell carcinoma 04/20/2016   B12 deficiency 04/08/2016   Encounter for preventive health examination 06/24/2014   Hypertension 06/23/2014   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  More pins-and-needles.  I am also seeing more wrinkles and definition in the fingers.     Pt accompanied by: self  PERTINENT HISTORY: Patient had on 10/08/2023 by Dr. 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  Mostly at At medial elbow and wrist and hand. PRECAUTIONS: Patient to follow surgeon's precautions    WEIGHT BEARING RESTRICTIONS: no  PAIN:  Are you having pain?  Denies nerve pain mostly at times  shooting nerve pain or in the ulnar hand and fifth finger  FALLS: Has patient fallen in last 6 months? No  LIVING ENVIRONMENT: Lives with: lives with their spouse  PLOF: Patient prior to surgery had normal active range of motion and strength.  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  Working and teaching yoga and doing things around the house  NEXT MD VISIT:?  OBJECTIVE:  Note:  Objective measures were completed at Evaluation unless otherwise noted.  HAND DOMINANCE: Right  ADLs: Patient mostly limited by wrist and hand weakness.  Unable to grip objects and pick it up with weight as well as fine motor unable to pick up small objects.    UPPER EXTREMITY ROM at eval     Patient with decreased proprioception and coordination in right upper extremity range of motion.  Slow and deliberate. Pain and tenderness over right cubital tunnel Increased pain and tenderness over Guyon's canal As well as tenderness over carpal tunnel at volar wrist Bruising present over dorsal wrist. Patient reports he had some IVs in dorsal R wrist  And had a lot of swelling in R hand and wrist postop composite nerve glide and shoulder abduction had increased pain at stage 4 of 5.  Pain from elbow into wrist and hand.    Active ROM Right eval Left eval  Shoulder flexion    Shoulder abduction    Shoulder adduction    Shoulder extension    Shoulder internal rotation    Shoulder external rotation    Elbow flexion    Elbow extension    Wrist flexion 84   Wrist extension 64   Wrist ulnar deviation 20   Wrist radial deviation 25   Wrist pronation 90   Wrist supination 90      At eval  Extension or tapping of digits on right hand of table needs passive range of motion and placed on hold for second digit.  3rd through 5th 3 -/5 strength Lumbrical fist decreased PIP extension Intrinsic a fist within functional limits 3rd through 5th decrease in second digit Decrease strength for dorsal and volar interossei Composite fist three-quarter range PROM Opening of digits limited at PIP extension after composite fist.  02/27/24:    Active ROM Right eval Left eval R 10/31/23 R 11/05/23 R/12/03/23 R 12/17/23 Place and hold  R 01/16/24  Thumb MCP (0-60) 35      48/ L 50  Thumb IP (0-80) 10      30 / L 70  Thumb Radial abd/add (0-55) WFL     WFL  55 including EPB  Thumb Palmar abd/add (0-45)  impaired     Place and hold 45  Able to do AAROM over ball   Thumb Opposition to Small Finger Unable to do any opposition even to second digit   Opposed to middle phalanges of second digit Pick up 2 cm foam block to 3rd      Index MCP (0-90)    75 80 80 80   Index PIP (0-100)    35 35 60 70   Index DIP (0-70)           Long MCP (0-90)     75 80 80 80   Long PIP (0-100)     60 65 65 70   Long DIP (0-70)  Ring MCP (0-90)     80 85 85 85   Ring PIP (0-100)     75 85 85 85   Ring DIP (0-70)           Little MCP (0-90)     80 90 90 90   Little PIP (0-100)     75 90 90 90   Little DIP (0-70)           (Blank rows = not tested)  HAND FUNCTION: Not tested evaluation  11/05/23 Grip strength: Right: 0 lbs; Left: 76 lbs, Lateral pinch: Right: 0 lbs, Left:   lbs, and 3 point pinch: Right: 0 lbs, Left:   lbs 02/03/24 Grip strength: Right: NT lbs; Left: 76 lbs, Lateral pinch: Right: 1 lbs, Left:   lbs, and 3 point pinch: Right: unable  lbs, Left:   lbs  COORDINATION: Impaired unable to make composite fist unable to do 2. Or 3 point pinch  SENSATION: Numbness in ulnar forearm into hand and fifth digit and ulnar side of fourth Report some pins-and-needles in DIP of third Patient with increased pain and tenderness over cubital tunnel as well as Guyon's canal.  Tenderness over volar wrist carpal tunnel  12/05/23: [place] was done.  Patient feel only deep pressure on ulnar side of distal upper arm elbow forearm and hand. Also not able to fill hot and cold. 12/20/23 Semmes-Weinstein this date on ulnar upper arm to mid forearm 4.56  consistent and delayed but able to identify 4.31  01/31/24 Semmes-Weinstein this date on ulnar upper arm to mid forearm 4.56  consistent and delayed but able to identify 4.31  02/13/24 Semmes-Weinstein this date on ulnar upper arm to 4.31;  proximal 2/3 forearm 4.56, deep pressure tat ulnar  DPC of hand and 2.83 normal sensation thumb thru 4th  02/27/24  Semmes-Weinstein this date on ulnar upper arm to  elbow and proximal 1/3 of forearm 4.31;  distal 2/3 forearm 4.56, deep pressure tat ulnar  DPC of hand and 2.83 normal sensation thumb thru 4th ; 4.56 on 5th   Positive Tinel DPC base of 5th   EDEMA: Patient report had severe edema postop in right hand and wrist.  Continue to have some bruising over her dorsal wrist  COGNITION: Overall cognitive status: WNL      TREATMENT DATE: 03/10/24      RE CERT measurements 02/27/24 :   R shoulder abduction and external rotation 4 -/5 Elbow flexion extension within normal limits Wrist flexion 4 FCU and extension for ECU 4 -/5 Pronation and supination 4 -/5  Opposition to middle phalanges of second and able to pick up 3 cm up check with opposition to 3rd through 5th although lateral side Thumb radial abduction 55 -4 -/5  palmar abduction with gravity 40 degrees --Unable to take any resistance for palmar abduction 2+/5 Thumb IP flexion 40 and MCP 48 degrees  PROM to digits-MCPs 2nd through 5th as follow 65, 60, 60, 55 degrees PIP flexion for second 3/5 as follow 50, 75, 75, 70 Patient the last 2 weeks able to initiate grasping lacrosse ball or size of a golf ball pushing into putty pulling it off if less than 2 seconds push Initiating some digit extension with Velcro board and into the putty light blue  Session today :      Patient to continue with MC flexion brace for nighttime use.   MCP flexion brace.  With the elastic Velcro maintaining flexion at the MCP  down to the dorsal wrist. Stabilization Velcro through palm and circular around wrist. To wear at 6 to 8 hours at night to facilitate more MC flexion Great success in passive range of motion since starting to wear the [facility] splint    HEP: ome program using flexion glove around flex bar yellow easy focusing on supination and pronation as well as ulnar deviation incorporating grip with flexion glove 2 sets of 12. Also with wrist flexion  extension alternating 12 reps    This date facilitated flexion at MCP and PIP using knuckle bender splint as well as flexion glove incorporation with food.  Reposition tension and flexion several times in 6 to 8 minutes. Followed by passive range of motion to [facility] flexion followed by passive range of motion PIP flexion Followed by passive range of motion for composite flexion. 12-15 reps Able to touch palm today with passive range of motion  Facilitate BTE hand grip.  2 pounds patient was able to do 6 reps with a partial grip into hand gripper. 2nd and 3rd attempt 3 reps. Completed passive range of motion and composite flexion in between  Followed by gripping into large light blue putty 3 sets of 8 Velcro board patient able to push against larger roller on a narrow strip hole length Unable to use finger flexion to roll on an unrestricted Velcro large volar Patient was able to do lateral pinch with extended second digit on the narrow strip Velcro-did better with buddy strapping 2nd-3rd to facilitate more flexion MCP and PIP-patient to do lateral pinch to her supination.  Cannot stabilize thumb for lateral pinch to pronation  Velcro board individual digit extension patient able to facilitate MCP flexion with PIP and MCP extension 8 reps each finger with individual block                                                                                                         PATIENT EDUCATION: Education details: Progressing changes in  HEP  Person educated: Patient Education method: Explanation, Demonstration, Tactile cues, Verbal cues, and Handouts Education comprehension: verbalized understanding, returned demonstration, verbal cues required, and needs further education    GOALS: Goals reviewed with patient? Yes  LONG TERM GOALS: Target date: 12 wks   Patient to be independent in home program to modify right upper extremity in ADLs and IADLs as well as positioning during the day  and night to decrease irritation on cubital tunnel and volar wrist with decreased pain Baseline: Pain over the cubital tunnel and Guyon's canal 6-7/10.  With increased tenderness and pain increased numbness over her ulnar forearm wrist and hand into fifth and ulnar side of fourth digit Goal status: Met  2.  Patient to be independent in home program to increase proprioception and ease  of right shoulder, elbow range of motion to normal reaching pattern  Baseline: Patient showed decreased coordination and proprioception in the reaching with the right upper extremity compensating with trunk and scapula Goal status: Met  3.  Right wrist and forearm strength increased to 4+ to initiate  strengthening and carrying groceries less than 4 pounds within no increase symptoms Baseline: Active range of motion within functional limits for supination pronation  but decreased wrist extension/ flexion decreased strength in ulnar deviation Goal status: Progressing  4.  Digit active range of motion increased for patient to make composite fist and open hand to grasp around toothbrush and release, put hand in pocket Baseline: Three-quarter of a fist.  Decreased PIP extension with a lumbrical fist.  Decreased intrinsic first and second digit unable to touch any fingertips with opposition.  Numbness on ulnar side of forearm into the wrist hand fifth digit and ulnar fourth Goal status: Progressing  5.  Patient to be independent home program to decrease pain and tenderness from elbow to hand to less than 2/10 Baseline: Pain 6-7/10 at cubital tunnel and volar wrist and Guyons canal- NOW  Goal status progressing  6.  Thumb active range of motion improved to within functional limits to be able to do palmar abduction to grasp around 4 cm object as well as increased opposition to pick up medication Baseline: Patient lacking 1 to 2 cm for opposition to second digit.  Unable to do opposition to any digits.  Decreased palmar  abduction.  Radial abduction 45 degrees Goal status: Progressing    ASSESSMENT:  CLINICAL IMPRESSION: Patient patient followed by OT for increased weakness, increased pain and numbness in right dominant upper extremity since after surgery.  Patient had on 10/08/2023 a R VATS nerve sheath tumor excision -and hx of nephrectomy .  Patient present with increased pain and tenderness over right cubital tunnel as well as Guyon canal and carpal tunnel Patient with decreased coordination and proprioception and range of motion of right shoulder and elbow in ADLs and IADLs.  Patient with decreased range of motion and strength in her right wrist ,digits and thumb.  Most weakness stiffness in thumb palmar abduction, digit flexion extension as well as interosseous.  NOW  Since using MC flexion night splint patient with increased passive range of motion to palm this date with less resistance.  Was able to initiate resistance for digit extension on Velcro board as well as gripping on BTE at 2 pounds 6 reps.  Needed min to mod assist for home exercises patient limited in functional use of right upper extremity in ADLs and IADLs.  Patient can benefit from skilled OT services to decrease pain and numbness increase motion and strength to return to prior level of function.  PERFORMANCE DEFICITS: in functional skills including ADLs, IADLs, coordination, dexterity, proprioception, sensation, edema, ROM, strength, pain, flexibility, Fine motor control, Gross motor control, decreased knowledge of precautions, and decreased knowledge of use of DME,  IMPAIRMENTS: are limiting patient from ADLs, IADLs, rest and sleep, work, play, leisure, and social participation.   COMORBIDITIES: may have co-morbidities  that affects occupational performance. Patient will benefit from skilled OT to address above impairments and improve overall function.  MODIFICATION OR ASSISTANCE TO COMPLETE EVALUATION: Min-Moderate modification of tasks or  assist with assess necessary to complete an evaluation.  OT OCCUPATIONAL PROFILE AND HISTORY: Detailed assessment: Review of records and additional review of physical, cognitive, psychosocial history related to current functional performance.  CLINICAL DECISION MAKING: Moderate - several treatment options, min-mod task modification necessary  REHAB POTENTIAL: Good for goals  EVALUATION COMPLEXITY: Moderate      PLAN:  OT FREQUENCY: 1-2x/week  OT DURATION: 12 weeks  PLANNED INTERVENTIONS: 97168 OT Re-evaluation, 97535 self care/ADL training, 02889 therapeutic exercise, 97530 therapeutic activity,  02887 neuromuscular re-education, 97140 manual therapy, [id number] fluidotherapy, [id number] contrast bath, 97033 iontophoresis, passive range of motion, patient/family education, and DME and/or AE instructions    CONSULTED AND AGREED WITH PLAN OF CARE: Patient   Ancel Peters, OTR/L,CLT 03/10/2024, 4:20 PM

## 2024-03-12 ENCOUNTER — Ambulatory Visit: Admitting: Occupational Therapy

## 2024-03-12 DIAGNOSIS — M79601 Pain in right arm: Secondary | ICD-10-CM

## 2024-03-12 DIAGNOSIS — M25641 Stiffness of right hand, not elsewhere classified: Secondary | ICD-10-CM | POA: Diagnosis not present

## 2024-03-12 DIAGNOSIS — M25631 Stiffness of right wrist, not elsewhere classified: Secondary | ICD-10-CM

## 2024-03-12 DIAGNOSIS — M6281 Muscle weakness (generalized): Secondary | ICD-10-CM

## 2024-03-12 NOTE — Therapy (Signed)
 OUTPATIENT OCCUPATIONAL THERAPY ORTHO TREATMENT  Patient Name: Daniel Lawrence MRN: [id number] DOB:16-Feb-1961, 63 y.o., male Today's Date: 03/12/2024  PCP: Dr Marylynn REFERRING PROVIDER:Dr Valli  END OF SESSION:  OT End of Session - 03/12/24 1454     Visit Number 31    Number of Visits 40    Date for OT Re-Evaluation 05/21/24    OT Start Time 1456    OT Stop Time 1540    OT Time Calculation (min) 44 min    Activity Tolerance Patient tolerated treatment well    Behavior During Therapy [facility] for tasks assessed/performed             Past Medical History:  Diagnosis Date   Allergy    seasonal   Arthritis    left knee   Cancer (HCC)    skin cancer   Cataract    Hyperlipidemia    Hypertension    Past Surgical History:  Procedure Laterality Date   BRAIN SURGERY  1971   COLONOSCOPY  05/03/2021   ELEVATION OF DEPRESSED SKULL FRACTURE  07/16/1969   traumatic blow to head by golf club   , [facility])   MOHS SURGERY  09/14/2011   left temple, squamous   ROBOT ASSISTED LAPAROSCOPIC NEPHRECTOMY Right 04/20/2016   Procedure: XI ROBOTIC ASSISTED LAPAROSCOPIC RADICAL NEPHRECTOMY;  Surgeon: Ricardo Likens, MD;  Location: [facility];  Service: Urology;  Laterality: Right;   Patient Active Problem List   Diagnosis Date Noted   Olecranon bursitis, right elbow 02/11/2024   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 12/26/2023   Physical deconditioning 12/26/2023   Tubular adenoma of colon 05/29/2023   Hearing loss due to cerumen impaction, right 05/29/2023   AC (acromioclavicular) arthritis 02/27/2023   Increased prostate specific antigen (PSA) velocity 07/03/2022   Lipoma of right shoulder 05/21/2022   Low back pain 09/15/2021   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   Aortic atherosclerosis (HCC) 04/18/2021   Melanoma in situ of right upper arm (HCC) 04/18/2021   H/O right nephrectomy 04/17/2020   Degenerative arthritis of left knee 02/25/2018   Chronic pain of left  knee 02/08/2018   CKD (chronic kidney disease) stage 3, GFR 30-59 ml/min (HCC) 02/08/2018   Coronary atherosclerosis due to lipid rich plaque 05/26/2016   History of renal cell carcinoma 04/20/2016   B12 deficiency 04/08/2016   Encounter for preventive health examination 06/24/2014   Hypertension 06/23/2014   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  My nerve conduction test is scheduled for early October and then a day or 2 later I see the doctor   Pt accompanied by: self  PERTINENT HISTORY: Patient had on 10/08/2023 by Dr. 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  Mostly at At medial elbow and  wrist and hand. PRECAUTIONS: Patient to follow surgeon's precautions    WEIGHT BEARING RESTRICTIONS: no  PAIN:  Are you having pain?  Denies nerve pain mostly at times shooting nerve pain or in the ulnar hand and fifth finger  FALLS: Has patient fallen in last 6 months? No  LIVING ENVIRONMENT: Lives with: lives with their spouse  PLOF: Patient prior to surgery had normal active range of motion and strength.  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  Working and teaching yoga and doing things around the house  NEXT MD VISIT:?  OBJECTIVE:  Note: Objective measures were completed at Evaluation unless otherwise noted.  HAND DOMINANCE: Right  ADLs: Patient mostly limited by wrist and hand weakness.  Unable to grip objects and pick it up with weight as well as fine motor unable to pick up small objects.    UPPER EXTREMITY ROM at eval     Patient with decreased proprioception and coordination in right upper extremity range of motion.  Slow and deliberate. Pain and tenderness over right cubital tunnel Increased pain and tenderness over Guyon's canal As well as tenderness over carpal tunnel at volar wrist Bruising present over dorsal wrist. Patient reports he had some IVs in dorsal R wrist  And had a lot of swelling in R hand and wrist postop composite nerve glide and shoulder abduction had increased pain at stage 4 of 5.  Pain from elbow into wrist and hand.    Active ROM Right eval Left eval  Shoulder flexion    Shoulder abduction    Shoulder adduction    Shoulder extension    Shoulder internal rotation    Shoulder external rotation    Elbow flexion    Elbow extension    Wrist flexion 84   Wrist extension 64   Wrist ulnar deviation 20   Wrist radial deviation 25   Wrist pronation 90   Wrist supination 90      At eval  Extension or tapping of digits on right hand of table needs passive range of motion and placed on hold for second digit.  3rd through 5th 3 -/5 strength Lumbrical fist decreased PIP extension Intrinsic a fist within functional limits 3rd through 5th decrease in second digit Decrease strength for dorsal and volar interossei Composite fist three-quarter range PROM Opening of digits limited at PIP extension after composite fist.  02/27/24:    Active ROM Right eval Left eval R 10/31/23 R 11/05/23 R/12/03/23 R 12/17/23 Place and hold  R 01/16/24  Thumb MCP (0-60) 35      48/ L 50  Thumb IP (0-80)  10      30 / L 70  Thumb Radial abd/add (0-55) WFL     WFL  55 including EPB  Thumb Palmar abd/add (0-45) impaired     Place and hold 45  Able to do AAROM over ball   Thumb Opposition to Small Finger Unable to do any opposition even to second digit   Opposed to middle phalanges of second digit Pick up 2 cm foam block to 3rd      Index MCP (0-90)    75 80 80 80   Index PIP (0-100)    35 35 60 70   Index DIP (0-70)  Long MCP (0-90)     75 80 80 80   Long PIP (0-100)     60 65 65 70   Long DIP (0-70)           Ring MCP (0-90)     80 85 85 85   Ring PIP (0-100)     75 85 85 85   Ring DIP (0-70)           Little MCP (0-90)     80 90 90 90   Little PIP (0-100)     75 90 90 90   Little DIP (0-70)           (Blank rows = not tested)  HAND FUNCTION: Not tested evaluation  11/05/23 Grip strength: Right: 0 lbs; Left: 76 lbs, Lateral pinch: Right: 0 lbs, Left:   lbs, and 3 point pinch: Right: 0 lbs, Left:   lbs 02/03/24 Grip strength: Right: NT lbs; Left: 76 lbs, Lateral pinch: Right: 1 lbs, Left:   lbs, and 3 point pinch: Right: unable  lbs, Left:   lbs  COORDINATION: Impaired unable to make composite fist unable to do 2. Or 3 point pinch  SENSATION: Numbness in ulnar forearm into hand and fifth digit and ulnar side of fourth Report some pins-and-needles in DIP of third Patient with increased pain and tenderness over cubital tunnel as well as Guyon's canal.  Tenderness over volar wrist carpal tunnel  12/05/23: [place] Speed was done.  Patient feel only deep pressure on ulnar side of distal upper arm elbow forearm and hand. Also not able to fill hot and cold. 12/20/23 Semmes-Weinstein this date on ulnar upper arm to mid forearm 4.56  consistent and delayed but able to identify 4.31  01/31/24 Semmes-Weinstein this date on ulnar upper arm to mid forearm 4.56  consistent and delayed but able to identify 4.31  02/13/24 Semmes-Weinstein this date on ulnar upper arm to 4.31;  proximal 2/3  forearm 4.56, deep pressure tat ulnar  DPC of hand and 2.83 normal sensation thumb thru 4th  02/27/24 Semmes-Weinstein this date on ulnar upper arm to  elbow and proximal 1/3 of forearm 4.31;  distal 2/3 forearm 4.56, deep pressure tat ulnar  DPC of hand and 2.83 normal sensation thumb thru 4th ; 4.56 on 5th   Positive Tinel DPC base of 5th   EDEMA: Patient report had severe edema postop in right hand and wrist.  Continue to have some bruising over her dorsal wrist  COGNITION: Overall cognitive status: WNL      TREATMENT DATE: 03/12/24      RE CERT measurements 02/27/24 :   R shoulder abduction and external rotation 4 -/5 Elbow flexion extension within normal limits Wrist flexion 4 FCU and extension for ECU 4 -/5 Pronation and supination 4 -/5  Opposition to middle phalanges of second and able to pick up 3 cm up check with opposition to 3rd through 5th although lateral side Thumb radial abduction 55 -4 -/5  palmar abduction with gravity 40 degrees --Unable to take any resistance for palmar abduction 2+/5 Thumb IP flexion 40 and MCP 48 degrees  PROM to digits-MCPs 2nd through 5th as follow 65, 60, 60, 55 degrees PIP flexion for second 3/5 as follow 50, 75, 75, 70 Patient the last 2 weeks able to initiate grasping lacrosse ball or size of a golf ball pushing into putty pulling it off if less than 2 seconds push Initiating some digit extension with Velcro board and  into the putty light blue  Session today :      Patient to continue with MC flexion brace for nighttime use.   MCP flexion brace.  With the elastic Velcro maintaining flexion at the MCP down to the dorsal wrist. Stabilization Velcro through palm and circular around wrist. To wear at 6 to 8 hours at night to facilitate more MC flexion Great success in passive range of motion since starting to wear the [facility] splint    HEP: ome program using flexion glove around flex bar yellow easy focusing on supination and pronation as  well as ulnar deviation incorporating grip with flexion glove 2 sets of 12. Also with wrist flexion extension alternating 12 reps    This date facilitated flexion at MCP and PIP using Ace wrap adjusted 3 times during some moist heat to increase flexion at MCP and PIPs as well as composite.   Some manual mobilization and joint mobilization done to metacarpals as well as PIPs prior to  followed by passive range of motion to [facility] flexion followed by passive range of motion PIP flexion Followed by passive range of motion for composite flexion. 12-15 reps Able to touch palm today with passive range of motion  Facilitate BTE hand grip 1 cm foam padding.  Able to do 3 sets of 10 and 1 set of 5 repetitions at 2 pounds  Completed passive range of motion and composite flexion in between   Velcro board patient able to push against larger roller on a narrow strip hole length Unable to use finger flexion to roll on an unrestricted Velcro large volar Patient was able to do lateral pinch with extended second digit on the narrow strip Velcro-did better with buddy strapping 2nd-3rd to facilitate more flexion MCP and PIP-patient to do lateral pinch to her supination.  Cannot stabilize thumb for lateral pinch to pronation  Velcro board individual digit extension patient able to facilitate MCP flexion with PIP and MCP extension 8 reps each finger with individual block          Lacrosse ball used to facilitate palmar abduction.  As well as attempts placing hold at palmar abduction with some resistance.  Patient unable to sustain. Patient able to balance with the right hand lacrosse ball catch with the left when placing lacrosse ball in right hand patient could initiate palmar abduction with gravity. Attempted rubber band with gravity.  Unable.                                                                                                  PATIENT EDUCATION: Education details: Progressing changes in  HEP   Person educated: Patient Education method: Explanation, Demonstration, Tactile cues, Verbal cues, and Handouts Education comprehension: verbalized understanding, returned demonstration, verbal cues required, and needs further education    GOALS: Goals reviewed with patient? Yes  LONG TERM GOALS: Target date: 12 wks   Patient to be independent in home program to modify right upper extremity in ADLs and IADLs as well as positioning during the day and night to decrease irritation on cubital tunnel and volar  wrist with decreased pain Baseline: Pain over the cubital tunnel and Guyon's canal 6-7/10.  With increased tenderness and pain increased numbness over her ulnar forearm wrist and hand into fifth and ulnar side of fourth digit Goal status: Met  2.  Patient to be independent in home program to increase proprioception and ease  of right shoulder, elbow range of motion to normal reaching pattern  Baseline: Patient showed decreased coordination and proprioception in the reaching with the right upper extremity compensating with trunk and scapula Goal status: Met  3.  Right wrist and forearm strength increased to 4+ to initiate strengthening and carrying groceries less than 4 pounds within no increase symptoms Baseline: Active range of motion within functional limits for supination pronation  but decreased wrist extension/ flexion decreased strength in ulnar deviation Goal status: Progressing  4.  Digit active range of motion increased for patient to make composite fist and open hand to grasp around toothbrush and release, put hand in pocket Baseline: Three-quarter of a fist.  Decreased PIP extension with a lumbrical fist.  Decreased intrinsic first and second digit unable to touch any fingertips with opposition.  Numbness on ulnar side of forearm into the wrist hand fifth digit and ulnar fourth Goal status: Progressing  5.  Patient to be independent home program to decrease pain and tenderness  from elbow to hand to less than 2/10 Baseline: Pain 6-7/10 at cubital tunnel and volar wrist and Guyons canal- NOW  Goal status progressing  6.  Thumb active range of motion improved to within functional limits to be able to do palmar abduction to grasp around 4 cm object as well as increased opposition to pick up medication Baseline: Patient lacking 1 to 2 cm for opposition to second digit.  Unable to do opposition to any digits.  Decreased palmar abduction.  Radial abduction 45 degrees Goal status: Progressing    ASSESSMENT:  CLINICAL IMPRESSION: Patient patient followed by OT for increased weakness, increased pain and numbness in right dominant upper extremity since after surgery.  Patient had on 10/08/2023 a R VATS nerve sheath tumor excision -and hx of nephrectomy .  Patient present with increased pain and tenderness over right cubital tunnel as well as Guyon canal and carpal tunnel Patient with decreased coordination and proprioception and range of motion of right shoulder and elbow in ADLs and IADLs.  Patient with decreased range of motion and strength in her right wrist ,digits and thumb.  Most weakness stiffness in thumb palmar abduction, digit flexion extension as well as interosseous.  NOW  Since using MC flexion night splint patient with increased passive range of motion to palm this date with less resistance.  Was able to initiate resistance for digit extension on Velcro board as well as gripping on BTE at 2 pounds with 1 cm padding around grip patient able to do 3 sets of 10 reps.  Needed min to mod assist for home exercises patient limited in functional use of right upper extremity in ADLs and IADLs.  Patient can benefit from skilled OT services to decrease pain and numbness increase motion and strength to return to prior level of function.  PERFORMANCE DEFICITS: in functional skills including ADLs, IADLs, coordination, dexterity, proprioception, sensation, edema, ROM, strength, pain,  flexibility, Fine motor control, Gross motor control, decreased knowledge of precautions, and decreased knowledge of use of DME,  IMPAIRMENTS: are limiting patient from ADLs, IADLs, rest and sleep, work, play, leisure, and social participation.   COMORBIDITIES: may have co-morbidities  that affects occupational performance. Patient will benefit from skilled OT to address above impairments and improve overall function.  MODIFICATION OR ASSISTANCE TO COMPLETE EVALUATION: Min-Moderate modification of tasks or assist with assess necessary to complete an evaluation.  OT OCCUPATIONAL PROFILE AND HISTORY: Detailed assessment: Review of records and additional review of physical, cognitive, psychosocial history related to current functional performance.  CLINICAL DECISION MAKING: Moderate - several treatment options, min-mod task modification necessary  REHAB POTENTIAL: Good for goals  EVALUATION COMPLEXITY: Moderate      PLAN:  OT FREQUENCY: 1-2x/week  OT DURATION: 12 weeks  PLANNED INTERVENTIONS: 97168 OT Re-evaluation, 97535 self care/ADL training, 02889 therapeutic exercise, 97530 therapeutic activity, 97112 neuromuscular re-education, 97140 manual therapy, 97039 fluidotherapy, 97034 contrast bath, 97033 iontophoresis, passive range of motion, patient/family education, and DME and/or AE instructions    CONSULTED AND AGREED WITH PLAN OF CARE: Patient   Ancel Peters, OTR/L,CLT 03/12/2024, 5:01 PM

## 2024-03-17 ENCOUNTER — Encounter: Admitting: Occupational Therapy

## 2024-03-20 ENCOUNTER — Ambulatory Visit: Attending: Thoracic Surgery (Cardiothoracic Vascular Surgery) | Admitting: Occupational Therapy

## 2024-03-20 DIAGNOSIS — M25631 Stiffness of right wrist, not elsewhere classified: Secondary | ICD-10-CM | POA: Insufficient documentation

## 2024-03-20 DIAGNOSIS — M6281 Muscle weakness (generalized): Secondary | ICD-10-CM | POA: Diagnosis not present

## 2024-03-20 DIAGNOSIS — M25641 Stiffness of right hand, not elsewhere classified: Secondary | ICD-10-CM | POA: Insufficient documentation

## 2024-03-20 DIAGNOSIS — M79601 Pain in right arm: Secondary | ICD-10-CM | POA: Diagnosis not present

## 2024-03-20 NOTE — Therapy (Signed)
 OUTPATIENT OCCUPATIONAL THERAPY ORTHO TREATMENT  Patient Name: Daniel Lawrence MRN: [id number] DOB:Mar 27, 1961, 63 y.o., male Today's Date: 03/20/2024  PCP: Dr Marylynn REFERRING PROVIDER:Dr Valli  END OF SESSION:  OT End of Session - 03/20/24 1000     Visit Number 32    Number of Visits 40    Date for OT Re-Evaluation 05/21/24    OT Start Time 0952    OT Stop Time 1030    OT Time Calculation (min) 38 min    Activity Tolerance Patient tolerated treatment well    Behavior During Therapy [facility] for tasks assessed/performed              Past Medical History:  Diagnosis Date   Allergy    seasonal   Arthritis    left knee   Cancer (HCC)    skin cancer   Cataract    Hyperlipidemia    Hypertension    Past Surgical History:  Procedure Laterality Date   BRAIN SURGERY  1971   COLONOSCOPY  05/03/2021   ELEVATION OF DEPRESSED SKULL FRACTURE  07/16/1969   traumatic blow to head by golf club   , [facility])   MOHS SURGERY  09/14/2011   left temple, squamous   ROBOT ASSISTED LAPAROSCOPIC NEPHRECTOMY Right 04/20/2016   Procedure: XI ROBOTIC ASSISTED LAPAROSCOPIC RADICAL NEPHRECTOMY;  Surgeon: Ricardo Likens, MD;  Location: [facility];  Service: Urology;  Laterality: Right;   Patient Active Problem List   Diagnosis Date Noted   Olecranon bursitis, right elbow 02/11/2024   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 12/26/2023   Physical deconditioning 12/26/2023   Tubular adenoma of colon 05/29/2023   Hearing loss due to cerumen impaction, right 05/29/2023   AC (acromioclavicular) arthritis 02/27/2023   Increased prostate specific antigen (PSA) velocity 07/03/2022   Lipoma of right shoulder 05/21/2022   Low back pain 09/15/2021   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   Aortic atherosclerosis (HCC) 04/18/2021   Melanoma in situ of right upper arm (HCC) 04/18/2021   H/O right nephrectomy 04/17/2020   Degenerative arthritis of left knee 02/25/2018   Chronic pain of left  knee 02/08/2018   CKD (chronic kidney disease) stage 3, GFR 30-59 ml/min (HCC) 02/08/2018   Coronary atherosclerosis due to lipid rich plaque 05/26/2016   History of renal cell carcinoma 04/20/2016   B12 deficiency 04/08/2016   Encounter for preventive health examination 06/24/2014   Hypertension 06/23/2014   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   Pt accompanied by: self  PERTINENT HISTORY: Patient had on 10/08/2023 by Dr. 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  Mostly at At medial elbow and wrist and hand. PRECAUTIONS: Patient to follow surgeon's precautions    WEIGHT BEARING RESTRICTIONS: no  PAIN:  Are you having pain?  Denies nerve pain mostly at  times shooting nerve pain or in the ulnar hand and fifth finger  FALLS: Has patient fallen in last 6 months? No  LIVING ENVIRONMENT: Lives with: lives with their spouse  PLOF: Patient prior to surgery had normal active range of motion and strength.  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  Working and teaching yoga and doing things around the house  NEXT MD VISIT:?  OBJECTIVE:  Note: Objective measures were completed at  Evaluation unless otherwise noted.  HAND DOMINANCE: Right  ADLs: Patient mostly limited by wrist and hand weakness.  Unable to grip objects and pick it up with weight as well as fine motor unable to pick up small objects.    UPPER EXTREMITY ROM at eval     Patient with decreased proprioception and coordination in right upper extremity range of motion.  Slow and deliberate. Pain and tenderness over right cubital tunnel Increased pain and tenderness over Guyon's canal As well as tenderness over carpal tunnel at volar wrist Bruising present over dorsal wrist. Patient reports he had some IVs in dorsal R wrist  And had a lot of swelling in R hand and wrist postop composite nerve glide and shoulder abduction had increased pain at stage 4 of 5.  Pain from elbow into wrist and hand.    Active ROM Right eval Left eval  Shoulder flexion    Shoulder abduction    Shoulder adduction    Shoulder extension    Shoulder internal rotation    Shoulder external rotation    Elbow flexion    Elbow extension    Wrist flexion 84   Wrist extension 64   Wrist ulnar deviation 20   Wrist radial deviation 25   Wrist pronation 90   Wrist supination 90      At eval  Extension or tapping of digits on right hand of table needs passive range of motion and placed on hold for second digit.  3rd through 5th 3 -/5 strength Lumbrical fist decreased PIP extension Intrinsic a fist within functional limits 3rd through 5th decrease in second digit Decrease strength for dorsal and volar interossei Composite fist three-quarter range PROM Opening of digits limited at PIP extension after composite fist.  02/27/24:    Active ROM Right eval Left eval R 10/31/23 R 11/05/23 R/12/03/23 R 12/17/23 Place and hold  R 01/16/24  Thumb MCP (0-60) 35      48/ L 50  Thumb IP (0-80) 10      30 / L 70  Thumb Radial abd/add (0-55) WFL     WFL  55 including EPB  Thumb Palmar abd/add (0-45) impaired     Place and hold 45  Able  to do AAROM over ball   Thumb Opposition to Small Finger Unable to do any opposition even to second digit   Opposed to middle phalanges of second digit Pick up 2 cm foam block to 3rd      Index MCP (0-90)    75 80 80 80   Index PIP (0-100)    35 35 60 70   Index DIP (0-70)           Long MCP (0-90)     75 80 80 80   Long PIP (0-100)     60 65 65 70   Long DIP (0-70)  Ring MCP (0-90)     80 85 85 85   Ring PIP (0-100)     75 85 85 85   Ring DIP (0-70)           Little MCP (0-90)     80 90 90 90   Little PIP (0-100)     75 90 90 90   Little DIP (0-70)           (Blank rows = not tested)  HAND FUNCTION: Not tested evaluation  11/05/23 Grip strength: Right: 0 lbs; Left: 76 lbs, Lateral pinch: Right: 0 lbs, Left:   lbs, and 3 point pinch: Right: 0 lbs, Left:   lbs 02/03/24 Grip strength: Right: NT lbs; Left: 76 lbs, Lateral pinch: Right: 1 lbs, Left:   lbs, and 3 point pinch: Right: unable  lbs, Left:   lbs  COORDINATION: Impaired unable to make composite fist unable to do 2. Or 3 point pinch  SENSATION: Numbness in ulnar forearm into hand and fifth digit and ulnar side of fourth Report some pins-and-needles in DIP of third Patient with increased pain and tenderness over cubital tunnel as well as Guyon's canal.  Tenderness over volar wrist carpal tunnel  12/05/23: [place] Speed was done.  Patient feel only deep pressure on ulnar side of distal upper arm elbow forearm and hand. Also not able to fill hot and cold. 12/20/23 Semmes-Weinstein this date on ulnar upper arm to mid forearm 4.56  consistent and delayed but able to identify 4.31  01/31/24 Semmes-Weinstein this date on ulnar upper arm to mid forearm 4.56  consistent and delayed but able to identify 4.31  02/13/24 Semmes-Weinstein this date on ulnar upper arm to 4.31;  proximal 2/3 forearm 4.56, deep pressure tat ulnar  DPC of hand and 2.83 normal sensation thumb thru 4th  02/27/24 Semmes-Weinstein this date on ulnar upper arm to   elbow and proximal 1/3 of forearm 4.31;  distal 2/3 forearm 4.56, deep pressure tat ulnar  DPC of hand and 2.83 normal sensation thumb thru 4th ; 4.56 on 5th   Positive Tinel DPC base of 5th   EDEMA: Patient report had severe edema postop in right hand and wrist.  Continue to have some bruising over her dorsal wrist  COGNITION: Overall cognitive status: WNL      TREATMENT DATE: 03/19/24      RE CERT measurements 02/27/24 :   R shoulder abduction and external rotation 4 -/5 Elbow flexion extension within normal limits Wrist flexion 4 FCU and extension for ECU 4 -/5 Pronation and supination 4 -/5  Opposition to middle phalanges of second and able to pick up 3 cm up check with opposition to 3rd through 5th although lateral side Thumb radial abduction 55 -4 -/5  palmar abduction with gravity 40 degrees --Unable to take any resistance for palmar abduction 2+/5 Thumb IP flexion 40 and MCP 48 degrees  PROM to digits-MCPs 2nd through 5th as follow 65, 60, 60, 55 degrees PIP flexion for second 3/5 as follow 50, 75, 75, 70 Patient the last 2 weeks able to initiate grasping lacrosse ball or size of a golf ball pushing into putty pulling it off if less than 2 seconds push Initiating some digit extension with Velcro board and into the putty light blue  Session today :      Patient to continue with MC flexion brace for nighttime use.   MCP flexion brace.  With the elastic Velcro maintaining flexion at the MCP  down to the dorsal wrist. Stabilization Velcro through palm and circular around wrist. To wear at 6 to 8 hours at night to facilitate more MC flexion Great success in passive range of motion since starting to wear the [facility] splint    HEP: ome program using flexion glove around flex bar yellow easy focusing on supination and pronation as well as ulnar deviation incorporating grip with flexion glove 2 sets of 12. Also with wrist flexion extension alternating 12 reps    This date  facilitated flexion at MCP and PIP using flexion glove adjusted 3 times during some moist heat to increase flexion at MCP and PIPs as well as composite.   After 5 minutes focused on weightbearing and pressure through proximal phalanges facilitating MCP flexion followed while OT facilitate PIP DIP flexion and patient maintaining weightbearing through dorsal proximal phalanges Followed by composite flexion passive range of motion by OT Some manual mobilization and joint mobilization done to metacarpals as well as PIPs prior to  Able to touch palm today with passive range of motion  Facilitate BTE hand grip 1 cm foam padding.  Able to do 1 set of 11 reps and 3 sets of 20 at 2 pounds  With composite flexion passive range of motion in between by OT touching palm  Was able to increase to 3-4 pounds last 4 reps on last set  Patient's grip strength was 1 pound on wide grip  lateral pinch was 2 pounds.  Patient's wrist extension and flexion in midline now with FCU and ECU 5 -/5. Improved since restart a week or 2 ago.                                                                                                 PATIENT EDUCATION: Education details: Progressing changes in  HEP  Person educated: Patient Education method: Explanation, Demonstration, Tactile cues, Verbal cues, and Handouts Education comprehension: verbalized understanding, returned demonstration, verbal cues required, and needs further education    GOALS: Goals reviewed with patient? Yes  LONG TERM GOALS: Target date: 12 wks   Patient to be independent in home program to modify right upper extremity in ADLs and IADLs as well as positioning during the day and night to decrease irritation on cubital tunnel and volar wrist with decreased pain Baseline: Pain over the cubital tunnel and Guyon's canal 6-7/10.  With increased tenderness and pain increased numbness over her ulnar forearm wrist and hand into fifth and ulnar side of  fourth digit Goal status: Met  2.  Patient to be independent in home program to increase proprioception and ease  of right shoulder, elbow range of motion to normal reaching pattern  Baseline: Patient showed decreased coordination and proprioception in the reaching with the right upper extremity compensating with trunk and scapula Goal status: Met  3.  Right wrist and forearm strength increased to 4+ to initiate strengthening and carrying groceries less than 4 pounds within no increase symptoms Baseline: Active range of motion within functional limits for supination pronation  but decreased wrist extension/ flexion decreased strength in ulnar deviation Goal status:  Progressing  4.  Digit active range of motion increased for patient to make composite fist and open hand to grasp around toothbrush and release, put hand in pocket Baseline: Three-quarter of a fist.  Decreased PIP extension with a lumbrical fist.  Decreased intrinsic first and second digit unable to touch any fingertips with opposition.  Numbness on ulnar side of forearm into the wrist hand fifth digit and ulnar fourth Goal status: Progressing  5.  Patient to be independent home program to decrease pain and tenderness from elbow to hand to less than 2/10 Baseline: Pain 6-7/10 at cubital tunnel and volar wrist and Guyons canal- NOW  Goal status progressing  6.  Thumb active range of motion improved to within functional limits to be able to do palmar abduction to grasp around 4 cm object as well as increased opposition to pick up medication Baseline: Patient lacking 1 to 2 cm for opposition to second digit.  Unable to do opposition to any digits.  Decreased palmar abduction.  Radial abduction 45 degrees Goal status: Progressing    ASSESSMENT:  CLINICAL IMPRESSION: Patient patient followed by OT for increased weakness, increased pain and numbness in right dominant upper extremity since after surgery.  Patient had on 10/08/2023 a R  VATS nerve sheath tumor excision -and hx of nephrectomy .  Patient present with increased pain and tenderness over right cubital tunnel as well as Guyon canal and carpal tunnel Patient with decreased coordination and proprioception and range of motion of right shoulder and elbow in ADLs and IADLs.  Patient with decreased range of motion and strength in her right wrist ,digits and thumb.  Most weakness stiffness in thumb palmar abduction, digit flexion extension as well as interosseous.  NOW  Since using MC flexion night splint patient with increased passive range of motion unable to get composite passive range of motion touching palm.  Was able to initiate resistance for digit extension on Velcro board as well as gripping on BTE at 2 pounds with 1 cm padding around grip -this date patient was able to do 3 sets of 20 reps.  At 2 pounds.  Need composite flexion passive range of motion in between.  Needed min to mod assist for home exercises patient limited in functional use of right upper extremity in ADLs and IADLs.  Patient can benefit from skilled OT services to decrease pain and numbness increase motion and strength to return to prior level of function.  PERFORMANCE DEFICITS: in functional skills including ADLs, IADLs, coordination, dexterity, proprioception, sensation, edema, ROM, strength, pain, flexibility, Fine motor control, Gross motor control, decreased knowledge of precautions, and decreased knowledge of use of DME,  IMPAIRMENTS: are limiting patient from ADLs, IADLs, rest and sleep, work, play, leisure, and social participation.   COMORBIDITIES: may have co-morbidities  that affects occupational performance. Patient will benefit from skilled OT to address above impairments and improve overall function.  MODIFICATION OR ASSISTANCE TO COMPLETE EVALUATION: Min-Moderate modification of tasks or assist with assess necessary to complete an evaluation.  OT OCCUPATIONAL PROFILE AND HISTORY: Detailed  assessment: Review of records and additional review of physical, cognitive, psychosocial history related to current functional performance.  CLINICAL DECISION MAKING: Moderate - several treatment options, min-mod task modification necessary  REHAB POTENTIAL: Good for goals  EVALUATION COMPLEXITY: Moderate      PLAN:  OT FREQUENCY: 1-2x/week  OT DURATION: 12 weeks  PLANNED INTERVENTIONS: 97168 OT Re-evaluation, 97535 self care/ADL training, 02889 therapeutic exercise, 97530 therapeutic activity, 97112 neuromuscular re-education,  97140 manual therapy, [id number] fluidotherapy, 97034 contrast bath, 97033 iontophoresis, passive range of motion, patient/family education, and DME and/or AE instructions    CONSULTED AND AGREED WITH PLAN OF CARE: Patient   Ancel Peters, OTR/L,CLT 03/20/2024, 12:24 PM

## 2024-03-26 ENCOUNTER — Ambulatory Visit: Admitting: Occupational Therapy

## 2024-03-26 DIAGNOSIS — M25631 Stiffness of right wrist, not elsewhere classified: Secondary | ICD-10-CM

## 2024-03-26 DIAGNOSIS — M6281 Muscle weakness (generalized): Secondary | ICD-10-CM | POA: Diagnosis not present

## 2024-03-26 DIAGNOSIS — M79601 Pain in right arm: Secondary | ICD-10-CM | POA: Diagnosis not present

## 2024-03-26 DIAGNOSIS — M25641 Stiffness of right hand, not elsewhere classified: Secondary | ICD-10-CM | POA: Diagnosis not present

## 2024-03-26 NOTE — Therapy (Signed)
 OUTPATIENT OCCUPATIONAL THERAPY ORTHO TREATMENT  Patient Name: Daniel Lawrence MRN: [id number] DOB:September 02, 1960, 63 y.o., male Today's Date: 03/26/2024  PCP: Dr Marylynn REFERRING PROVIDER:Dr Valli  END OF SESSION:  OT End of Session - 03/26/24 1730     Visit Number 33    Number of Visits 40    Date for OT Re-Evaluation 05/21/24    OT Start Time 1625    OT Stop Time 1730    OT Time Calculation (min) 65 min    Activity Tolerance Patient tolerated treatment well    Behavior During Therapy [facility] for tasks assessed/performed              Past Medical History:  Diagnosis Date   Allergy    seasonal   Arthritis    left knee   Cancer (HCC)    skin cancer   Cataract    Hyperlipidemia    Hypertension    Past Surgical History:  Procedure Laterality Date   BRAIN SURGERY  1971   COLONOSCOPY  05/03/2021   ELEVATION OF DEPRESSED SKULL FRACTURE  07/16/1969   traumatic blow to head by golf club   , [facility])   MOHS SURGERY  09/14/2011   left temple, squamous   ROBOT ASSISTED LAPAROSCOPIC NEPHRECTOMY Right 04/20/2016   Procedure: XI ROBOTIC ASSISTED LAPAROSCOPIC RADICAL NEPHRECTOMY;  Surgeon: Ricardo Likens, MD;  Location: [facility];  Service: Urology;  Laterality: Right;   Patient Active Problem List   Diagnosis Date Noted   Olecranon bursitis, right elbow 02/11/2024   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 12/26/2023   Physical deconditioning 12/26/2023   Tubular adenoma of colon 05/29/2023   Hearing loss due to cerumen impaction, right 05/29/2023   AC (acromioclavicular) arthritis 02/27/2023   Increased prostate specific antigen (PSA) velocity 07/03/2022   Lipoma of right shoulder 05/21/2022   Low back pain 09/15/2021   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   Aortic atherosclerosis (HCC) 04/18/2021   Melanoma in situ of right upper arm (HCC) 04/18/2021   H/O right nephrectomy 04/17/2020   Degenerative arthritis of left knee 02/25/2018   Chronic pain of left  knee 02/08/2018   CKD (chronic kidney disease) stage 3, GFR 30-59 ml/min (HCC) 02/08/2018   Coronary atherosclerosis due to lipid rich plaque 05/26/2016   History of renal cell carcinoma 04/20/2016   B12 deficiency 04/08/2016   Encounter for preventive health examination 06/24/2014   Hypertension 06/23/2014   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   Pt accompanied by: self  PERTINENT HISTORY: Patient had on 10/08/2023 by Dr. 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  Mostly at At medial elbow and wrist and hand. PRECAUTIONS: Patient to follow surgeon's precautions    WEIGHT BEARING RESTRICTIONS: no  PAIN:  Are you having pain?  Denies nerve pain mostly at  times shooting nerve pain or in the ulnar hand and fifth finger  FALLS: Has patient fallen in last 6 months? No  LIVING ENVIRONMENT: Lives with: lives with their spouse  PLOF: Patient prior to surgery had normal active range of motion and strength.  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  Working and teaching yoga and doing things around the house  NEXT MD VISIT:?  OBJECTIVE:  Note: Objective measures were completed at  Evaluation unless otherwise noted.  HAND DOMINANCE: Right  ADLs: Patient mostly limited by wrist and hand weakness.  Unable to grip objects and pick it up with weight as well as fine motor unable to pick up small objects.    UPPER EXTREMITY ROM at eval     Patient with decreased proprioception and coordination in right upper extremity range of motion.  Slow and deliberate. Pain and tenderness over right cubital tunnel Increased pain and tenderness over Guyon's canal As well as tenderness over carpal tunnel at volar wrist Bruising present over dorsal wrist. Patient reports he had some IVs in dorsal R wrist  And had a lot of swelling in R hand and wrist postop composite nerve glide and shoulder abduction had increased pain at stage 4 of 5.  Pain from elbow into wrist and hand.    Active ROM Right eval Left eval  Shoulder flexion    Shoulder abduction    Shoulder adduction    Shoulder extension    Shoulder internal rotation    Shoulder external rotation    Elbow flexion    Elbow extension    Wrist flexion 84   Wrist extension 64   Wrist ulnar deviation 20   Wrist radial deviation 25   Wrist pronation 90   Wrist supination 90      At eval  Extension or tapping of digits on right hand of table needs passive range of motion and placed on hold for second digit.  3rd through 5th 3 -/5 strength Lumbrical fist decreased PIP extension Intrinsic a fist within functional limits 3rd through 5th decrease in second digit Decrease strength for dorsal and volar interossei Composite fist three-quarter range PROM Opening of digits limited at PIP extension after composite fist.  02/27/24:    Active ROM Right eval Left eval R 10/31/23 R 11/05/23 R/12/03/23 R 12/17/23 Place and hold  R 01/16/24  Thumb MCP (0-60) 35      48/ L 50  Thumb IP (0-80) 10      30 / L 70  Thumb Radial abd/add (0-55) WFL     WFL  55 including EPB  Thumb Palmar abd/add (0-45) impaired     Place and hold 45  Able  to do AAROM over ball   Thumb Opposition to Small Finger Unable to do any opposition even to second digit   Opposed to middle phalanges of second digit Pick up 2 cm foam block to 3rd      Index MCP (0-90)    75 80 80 80   Index PIP (0-100)    35 35 60 70   Index DIP (0-70)           Long MCP (0-90)     75 80 80 80   Long PIP (0-100)     60 65 65 70   Long DIP (0-70)  Ring MCP (0-90)     80 85 85 85   Ring PIP (0-100)     75 85 85 85   Ring DIP (0-70)           Little MCP (0-90)     80 90 90 90   Little PIP (0-100)     75 90 90 90   Little DIP (0-70)           (Blank rows = not tested)  HAND FUNCTION: Not tested evaluation  11/05/23 Grip strength: Right: 0 lbs; Left: 76 lbs, Lateral pinch: Right: 0 lbs, Left:   lbs, and 3 point pinch: Right: 0 lbs, Left:   lbs 02/03/24 Grip strength: Right: NT lbs; Left: 76 lbs, Lateral pinch: Right: 1 lbs, Left:   lbs, and 3 point pinch: Right: unable  lbs, Left:   lbs  COORDINATION: Impaired unable to make composite fist unable to do 2. Or 3 point pinch  SENSATION: Numbness in ulnar forearm into hand and fifth digit and ulnar side of fourth Report some pins-and-needles in DIP of third Patient with increased pain and tenderness over cubital tunnel as well as Guyon's canal.  Tenderness over volar wrist carpal tunnel  12/05/23: [place] Speed was done.  Patient feel only deep pressure on ulnar side of distal upper arm elbow forearm and hand. Also not able to fill hot and cold. 12/20/23 Semmes-Weinstein this date on ulnar upper arm to mid forearm 4.56  consistent and delayed but able to identify 4.31  01/31/24 Semmes-Weinstein this date on ulnar upper arm to mid forearm 4.56  consistent and delayed but able to identify 4.31  02/13/24 Semmes-Weinstein this date on ulnar upper arm to 4.31;  proximal 2/3 forearm 4.56, deep pressure tat ulnar  DPC of hand and 2.83 normal sensation thumb thru 4th  02/27/24 Semmes-Weinstein this date on ulnar upper arm to   elbow and proximal 1/3 of forearm 4.31;  distal 2/3 forearm 4.56, deep pressure tat ulnar  DPC of hand and 2.83 normal sensation thumb thru 4th ; 4.56 on 5th   Positive Tinel DPC base of 5th   EDEMA: Patient report had severe edema postop in right hand and wrist.  Continue to have some bruising over her dorsal wrist  COGNITION: Overall cognitive status: WNL      TREATMENT DATE: 03/19/24      RE CERT measurements 02/27/24 :   R shoulder abduction and external rotation 4 -/5 Elbow flexion extension within normal limits Wrist flexion 4 FCU and extension for ECU 4 -/5 Pronation and supination 4 -/5  Opposition to middle phalanges of second and able to pick up 3 cm up check with opposition to 3rd through 5th although lateral side Thumb radial abduction 55 -4 -/5  palmar abduction with gravity 40 degrees --Unable to take any resistance for palmar abduction 2+/5 Thumb IP flexion 40 and MCP 48 degrees  PROM to digits-MCPs 2nd through 5th as follow 65, 60, 60, 55 degrees PIP flexion for second 3/5 as follow 50, 75, 75, 70 Patient the last 2 weeks able to initiate grasping lacrosse ball or size of a golf ball pushing into putty pulling it off if less than 2 seconds push Initiating some digit extension with Velcro board and into the putty light blue  Session today :      Patient to continue with MC flexion brace for nighttime use.   MCP flexion brace.  With the elastic Velcro maintaining flexion at the MCP  down to the dorsal wrist. Stabilization Velcro through palm and circular around wrist. To wear at 6 to 8 hours at night to facilitate more MC flexion Great success in passive range of motion since starting to wear the [facility] splint    HEP: Upgrade today to flexion glove around flex bar RED  easy focusing on supination and pronation as well as ulnar deviation incorporating grip with flexion glove Also with wrist flexion extension alternating  3 sets of 12  1 x day  Show increase ECU  and FCU since 8/14    This date facilitated flexion at MCP and PIP using flexion glove adjusted 3 times during some moist heat to increase flexion at MCP and PIPs as well as composite.   After 5 minutes focused on weightbearing and pressure through proximal phalanges facilitating MCP flexion followed while OT facilitate PIP DIP flexion and patient maintaining weightbearing through dorsal proximal phalanges Followed by composite flexion passive range of motion by OT Some manual mobilization and joint mobilization done to metacarpals as well as PIPs prior to  Able to touch palm today with passive range of motion  Facilitate BTE hand grip 1 cm foam padding.  Able to do  3 sets of 20 at 3 and 4  pounds  With composite flexion passive range of motion in between by OT touching palm  Was able to increase to 3-4 pounds  this session   This date small ball for light blue putty for ADD of 5th  Cont to show decrease thumb PA  and FPB                                                                                              PATIENT EDUCATION: Education details: Progressing changes in  HEP  Person educated: Patient Education method: Explanation, Demonstration, Tactile cues, Verbal cues, and Handouts Education comprehension: verbalized understanding, returned demonstration, verbal cues required, and needs further education    GOALS: Goals reviewed with patient? Yes  LONG TERM GOALS: Target date: 12 wks   Patient to be independent in home program to modify right upper extremity in ADLs and IADLs as well as positioning during the day and night to decrease irritation on cubital tunnel and volar wrist with decreased pain Baseline: Pain over the cubital tunnel and Guyon's canal 6-7/10.  With increased tenderness and pain increased numbness over her ulnar forearm wrist and hand into fifth and ulnar side of fourth digit Goal status: Met  2.  Patient to be independent in home program to  increase proprioception and ease  of right shoulder, elbow range of motion to normal reaching pattern  Baseline: Patient showed decreased coordination and proprioception in the reaching with the right upper extremity compensating with trunk and scapula Goal status: Met  3.  Right wrist and forearm strength increased to 4+ to initiate strengthening and carrying groceries less than 4 pounds within no increase symptoms Baseline: Active range of motion within functional limits for supination pronation  but decreased wrist extension/ flexion decreased strength in ulnar deviation Goal status: Progressing  4.  Digit active range of motion increased for  patient to make composite fist and open hand to grasp around toothbrush and release, put hand in pocket Baseline: Three-quarter of a fist.  Decreased PIP extension with a lumbrical fist.  Decreased intrinsic first and second digit unable to touch any fingertips with opposition.  Numbness on ulnar side of forearm into the wrist hand fifth digit and ulnar fourth Goal status: Progressing  5.  Patient to be independent home program to decrease pain and tenderness from elbow to hand to less than 2/10 Baseline: Pain 6-7/10 at cubital tunnel and volar wrist and Guyons canal- NOW  Goal status progressing  6.  Thumb active range of motion improved to within functional limits to be able to do palmar abduction to grasp around 4 cm object as well as increased opposition to pick up medication Baseline: Patient lacking 1 to 2 cm for opposition to second digit.  Unable to do opposition to any digits.  Decreased palmar abduction.  Radial abduction 45 degrees Goal status: Progressing    ASSESSMENT:  CLINICAL IMPRESSION: Patient patient followed by OT for increased weakness, increased pain and numbness in right dominant upper extremity since after surgery.  Patient had on 10/08/2023 a R VATS nerve sheath tumor excision -and hx of nephrectomy .  Patient present with  increased pain and tenderness over right cubital tunnel as well as Guyon canal and carpal tunnel Patient with decreased coordination and proprioception and range of motion of right shoulder and elbow in ADLs and IADLs.  Patient with decreased range of motion and strength in her right wrist ,digits and thumb.  Most weakness stiffness in thumb palmar abduction, digit flexion extension as well as interosseous.  NOW  Since using MC flexion night splint patient with increased passive range of motion unable to get composite passive range of motion touching palm.  Was able to initiate resistance for digit extension on Velcro board as well as gripping on BTE at 3-4  pounds with 1 cm padding around grip -this date patient was able to do 3 sets of 20 reps.but need to stabilize thumb.  Need composite flexion passive range of motion in between. Upgrade pt to red flex bar for wrist and forearm  in all  planes - with flexion glove - Needed min to mod assist for home exercises patient limited in functional use of right upper extremity in ADLs and IADLs.  Patient can benefit from skilled OT services to decrease pain and numbness increase motion and strength to return to prior level of function.  PERFORMANCE DEFICITS: in functional skills including ADLs, IADLs, coordination, dexterity, proprioception, sensation, edema, ROM, strength, pain, flexibility, Fine motor control, Gross motor control, decreased knowledge of precautions, and decreased knowledge of use of DME,  IMPAIRMENTS: are limiting patient from ADLs, IADLs, rest and sleep, work, play, leisure, and social participation.   COMORBIDITIES: may have co-morbidities  that affects occupational performance. Patient will benefit from skilled OT to address above impairments and improve overall function.  MODIFICATION OR ASSISTANCE TO COMPLETE EVALUATION: Min-Moderate modification of tasks or assist with assess necessary to complete an evaluation.  OT OCCUPATIONAL PROFILE  AND HISTORY: Detailed assessment: Review of records and additional review of physical, cognitive, psychosocial history related to current functional performance.  CLINICAL DECISION MAKING: Moderate - several treatment options, min-mod task modification necessary  REHAB POTENTIAL: Good for goals  EVALUATION COMPLEXITY: Moderate      PLAN:  OT FREQUENCY: 1-2x/week  OT DURATION: 12 weeks  PLANNED INTERVENTIONS: 02831 OT Re-evaluation, 97535 self care/ADL training,  97110 therapeutic exercise, 97530 therapeutic activity, 97112 neuromuscular re-education, 97140 manual therapy, 97039 fluidotherapy, 97034 contrast bath, 97033 iontophoresis, passive range of motion, patient/family education, and DME and/or AE instructions    CONSULTED AND AGREED WITH PLAN OF CARE: Patient   Ancel Peters, OTR/L,CLT 03/26/2024, 5:36 PM

## 2024-03-31 ENCOUNTER — Ambulatory Visit: Admitting: Occupational Therapy

## 2024-03-31 DIAGNOSIS — M25631 Stiffness of right wrist, not elsewhere classified: Secondary | ICD-10-CM | POA: Diagnosis not present

## 2024-03-31 DIAGNOSIS — M79601 Pain in right arm: Secondary | ICD-10-CM | POA: Diagnosis not present

## 2024-03-31 DIAGNOSIS — M6281 Muscle weakness (generalized): Secondary | ICD-10-CM

## 2024-03-31 DIAGNOSIS — M25641 Stiffness of right hand, not elsewhere classified: Secondary | ICD-10-CM | POA: Diagnosis not present

## 2024-03-31 NOTE — Therapy (Signed)
 OUTPATIENT OCCUPATIONAL THERAPY ORTHO TREATMENT  Patient Name: Daniel Lawrence MRN: [id number] DOB:11/25/1960, 63 y.o., male Today's Date: 03/31/2024  PCP: Dr Marylynn REFERRING PROVIDER:Dr Valli  END OF SESSION:  OT End of Session - 03/31/24 1747     Visit Number 34    Number of Visits 40    Date for OT Re-Evaluation 05/21/24    OT Start Time 1404    OT Stop Time 1452    OT Time Calculation (min) 48 min    Activity Tolerance Patient tolerated treatment well    Behavior During Therapy [facility] for tasks assessed/performed              Past Medical History:  Diagnosis Date   Allergy    seasonal   Arthritis    left knee   Cancer (HCC)    skin cancer   Cataract    Hyperlipidemia    Hypertension    Past Surgical History:  Procedure Laterality Date   BRAIN SURGERY  1971   COLONOSCOPY  05/03/2021   ELEVATION OF DEPRESSED SKULL FRACTURE  07/16/1969   traumatic blow to head by golf club   , [facility])   MOHS SURGERY  09/14/2011   left temple, squamous   ROBOT ASSISTED LAPAROSCOPIC NEPHRECTOMY Right 04/20/2016   Procedure: XI ROBOTIC ASSISTED LAPAROSCOPIC RADICAL NEPHRECTOMY;  Surgeon: Ricardo Likens, MD;  Location: [facility];  Service: Urology;  Laterality: Right;   Patient Active Problem List   Diagnosis Date Noted   Olecranon bursitis, right elbow 02/11/2024   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 12/26/2023   Physical deconditioning 12/26/2023   Tubular adenoma of colon 05/29/2023   Hearing loss due to cerumen impaction, right 05/29/2023   AC (acromioclavicular) arthritis 02/27/2023   Increased prostate specific antigen (PSA) velocity 07/03/2022   Lipoma of right shoulder 05/21/2022   Low back pain 09/15/2021   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   Aortic atherosclerosis (HCC) 04/18/2021   Melanoma in situ of right upper arm (HCC) 04/18/2021   H/O right nephrectomy 04/17/2020   Degenerative arthritis of left knee 02/25/2018   Chronic pain of left  knee 02/08/2018   CKD (chronic kidney disease) stage 3, GFR 30-59 ml/min (HCC) 02/08/2018   Coronary atherosclerosis due to lipid rich plaque 05/26/2016   History of renal cell carcinoma 04/20/2016   B12 deficiency 04/08/2016   Encounter for preventive health examination 06/24/2014   Hypertension 06/23/2014   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  But I can tell I can use my hand now more to hold onto the railing for going up the stairs, I got these attachments that I can tie my shoes now.  I can hold the steering wheel little bit more of my fingers.  But did you see the change my EMG appointment to December or January now.  I need to call the doctor's office  Pt accompanied by: self  PERTINENT HISTORY: Patient had on 10/08/2023 by Dr. 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  Mostly at At medial elbow and wrist and hand. PRECAUTIONS: Patient to follow surgeon's precautions    WEIGHT BEARING RESTRICTIONS: no  PAIN:  Are you having pain?  Denies nerve pain mostly at times shooting nerve pain or in the ulnar hand and fifth finger  FALLS: Has patient fallen in last 6 months? No  LIVING ENVIRONMENT: Lives with: lives with their  spouse  PLOF: Patient prior to surgery had normal active range of motion and strength.  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  Working and teaching yoga and doing things around the house  NEXT MD VISIT:?  OBJECTIVE:  Note: Objective measures were completed at Evaluation unless otherwise noted.  HAND DOMINANCE: Right  ADLs: Patient mostly limited by wrist and hand weakness.  Unable to grip objects and pick it up with weight as well as fine motor unable to pick up small objects.    UPPER EXTREMITY ROM at eval     Patient with decreased proprioception and coordination in right upper extremity range of motion.  Slow and deliberate. Pain and tenderness over right cubital tunnel Increased pain and tenderness over Guyon's canal As well as tenderness over carpal tunnel at volar wrist Bruising present over dorsal wrist. Patient reports he had some IVs in dorsal R wrist  And had a lot of swelling in R hand and wrist postop composite nerve glide and shoulder abduction had increased pain at stage 4 of 5.  Pain from elbow into wrist and hand.    Active ROM Right eval Left eval  Shoulder flexion    Shoulder abduction    Shoulder adduction    Shoulder extension    Shoulder internal rotation    Shoulder external rotation    Elbow flexion    Elbow extension    Wrist flexion 84   Wrist extension 64   Wrist ulnar deviation 20   Wrist radial deviation 25   Wrist pronation 90   Wrist supination 90      At eval  Extension or tapping of digits on right hand of table needs passive range of motion and placed on hold for second digit.  3rd through 5th 3 -/5 strength Lumbrical fist decreased PIP extension Intrinsic a fist within functional limits 3rd through 5th decrease in second digit Decrease strength for dorsal and volar interossei Composite fist  three-quarter range PROM Opening of digits limited at PIP extension after composite fist.  02/27/24:    Active ROM Right eval Left eval R 10/31/23 R 11/05/23 R/12/03/23 R 12/17/23 Place and hold  R 01/16/24  Thumb MCP (0-60) 35      48/ L 50  Thumb IP (0-80) 10      30 / L 70  Thumb Radial abd/add (0-55) WFL     WFL  55 including EPB  Thumb Palmar abd/add (0-45) impaired     Place and hold 45  Able to do AAROM over ball   Thumb Opposition to Small Finger Unable to do any opposition even to  second digit   Opposed to middle phalanges of second digit Pick up 2 cm foam block to 3rd      Index MCP (0-90)    75 80 80 80   Index PIP (0-100)    35 35 60 70   Index DIP (0-70)           Long MCP (0-90)     75 80 80 80   Long PIP (0-100)     60 65 65 70   Long DIP (0-70)           Ring MCP (0-90)     80 85 85 85   Ring PIP (0-100)     75 85 85 85   Ring DIP (0-70)           Little MCP (0-90)     80 90 90 90   Little PIP (0-100)     75 90 90 90   Little DIP (0-70)           (Blank rows = not tested)  HAND FUNCTION: Not tested evaluation  11/05/23 Grip strength: Right: 0 lbs; Left: 76 lbs, Lateral pinch: Right: 0 lbs, Left:   lbs, and 3 point pinch: Right: 0 lbs, Left:   lbs 02/03/24 Grip strength: Right: NT lbs; Left: 76 lbs, Lateral pinch: Right: 1 lbs, Left:   lbs, and 3 point pinch: Right: unable  lbs, Left:   lbs  COORDINATION: Impaired unable to make composite fist unable to do 2. Or 3 point pinch  SENSATION: Numbness in ulnar forearm into hand and fifth digit and ulnar side of fourth Report some pins-and-needles in DIP of third Patient with increased pain and tenderness over cubital tunnel as well as Guyon's canal.  Tenderness over volar wrist carpal tunnel  12/05/23: [place] Speed was done.  Patient feel only deep pressure on ulnar side of distal upper arm elbow forearm and hand. Also not able to fill hot and cold. 12/20/23 Semmes-Weinstein this date on ulnar upper arm to mid forearm  4.56  consistent and delayed but able to identify 4.31  01/31/24 Semmes-Weinstein this date on ulnar upper arm to mid forearm 4.56  consistent and delayed but able to identify 4.31  02/13/24 Semmes-Weinstein this date on ulnar upper arm to 4.31;  proximal 2/3 forearm 4.56, deep pressure tat ulnar  DPC of hand and 2.83 normal sensation thumb thru 4th  02/27/24 Semmes-Weinstein this date on ulnar upper arm to  elbow and proximal 1/3 of forearm 4.31;  distal 2/3 forearm 4.56, deep pressure tat ulnar  DPC of hand and 2.83 normal sensation thumb thru 4th ; 4.56 on 5th   Positive Tinel DPC base of 5th   EDEMA: Patient report had severe edema postop in right hand and wrist.  Continue to have some bruising over her dorsal wrist  COGNITION: Overall cognitive status: WNL      TREATMENT DATE: 03/31/24      RE CERT measurements 02/27/24 :   R shoulder abduction and external rotation 4 -/5 Elbow flexion extension within normal limits Wrist flexion 4 FCU and extension for ECU 4 -/5 Pronation and supination 4 -/5  Opposition to middle phalanges of second and able to pick up 3 cm up check with opposition to 3rd through 5th although lateral side Thumb radial abduction 55 -4 -/5  palmar abduction with gravity 40 degrees --Unable to take any resistance for palmar abduction 2+/5 Thumb IP flexion 40 and MCP 48 degrees  PROM to digits-MCPs 2nd through 5th as follow 65, 60, 60, 55 degrees PIP flexion for second 3/5 as follow 50, 75, 75, 70 Patient the last 2 weeks able to initiate grasping lacrosse ball or size of a golf ball pushing into putty pulling it off if less than 2 seconds push Initiating some digit extension with Velcro board and into the putty light blue  Session today :      Patient to continue with MC flexion brace for nighttime use.  Recommend for patient to bring it in to adjusted more flexion  at [facility] if possible MCP flexion brace.  With the elastic Velcro maintaining flexion at the MCP  down to the dorsal wrist. Stabilization Velcro through palm and circular around wrist. To wear at 6 to 8 hours at night to facilitate more MC flexion Great success in passive range of motion since starting to wear the [facility] splint    HEP: Upgrade today to flexion glove around flex bar RED  easy focusing on supination and pronation as well as ulnar deviation incorporating grip with flexion glove Also with wrist flexion extension alternating  3 sets of 12  1 x day  Show increase ECU and FCU since 8/14    This date focused on thumb palmar abduction as well as digit extension.   Observed several times when patient reached unintentionally for lacrosse ball or 1 kg weighted ball thumb at tucks and some palmar abduction.   Patient on holding lacrosse ball and palmar abduction pushing and pulling likely of light blue putty as well as light twisting left and right.   1 kg ball patient was able to grasp with 3rd through 5th and palmar abduction needs some positioning initially but able to hold against gravity and do some wrist extension and ulnar radial deviation In combination with elbow flexion extension as well as shoulder. Velcro board done pulling up small Velcro block with strap for individually digit extension 10 reps each As well as larger roller pushing digit extension 2nd and 3rd then 4th and 5th 4 times Unable to pull it against Velcro.  Was able with built-up foam as well as a washcloth around to built-up handle patient was able to do supination better than pronation Needed max verbal cueing to start in neutral with some assistance for palmar abduction and then facilitate supination 2 times as well as pronation.  Unable to maintain palmar abduction with pronation.  As well as compensating with the wrist flexion.  With enlarged grip also on the Velcro board handle patient was able to combine a grip and large with a foam roller and washcloth with positioning and palmar abduction could do  wrist extension and wrist flexion 2 sets.   Patient to focus on thumb palmar abduction with lacrosse ball and light blue putty at home as well as twisting force and digit extension as well as palmar radial abduction resistance Continue with small putty for adduction of fifth digit As well as putty for lateral pinch.  PATIENT EDUCATION: Education details: Progressing changes in  HEP  Person educated: Patient Education method: Explanation, Demonstration, Tactile cues, Verbal cues, and Handouts Education comprehension: verbalized understanding, returned demonstration, verbal cues required, and needs further education    GOALS: Goals reviewed with patient? Yes  LONG TERM GOALS: Target date: 12 wks   Patient to be independent in home program to modify right upper extremity in ADLs and IADLs as well as positioning during the day and night to decrease irritation on cubital tunnel and volar wrist with decreased pain Baseline: Pain over the cubital tunnel and Guyon's canal 6-7/10.  With increased tenderness and pain increased numbness over her ulnar forearm wrist and hand into fifth and ulnar side of fourth digit Goal status: Met  2.  Patient to be independent in home program to increase proprioception and ease  of right shoulder, elbow range of motion to normal reaching pattern  Baseline: Patient showed decreased coordination and proprioception in the reaching with the right upper extremity compensating with trunk and scapula Goal status: Met  3.  Right wrist and forearm strength increased to 4+ to initiate strengthening and carrying groceries less than 4 pounds within no increase symptoms Baseline: Active range of motion within functional limits for supination pronation  but decreased wrist extension/ flexion decreased strength in ulnar deviation Goal status: Progressing  4.  Digit active range of  motion increased for patient to make composite fist and open hand to grasp around toothbrush and release, put hand in pocket Baseline: Three-quarter of a fist.  Decreased PIP extension with a lumbrical fist.  Decreased intrinsic first and second digit unable to touch any fingertips with opposition.  Numbness on ulnar side of forearm into the wrist hand fifth digit and ulnar fourth Goal status: Progressing  5.  Patient to be independent home program to decrease pain and tenderness from elbow to hand to less than 2/10 Baseline: Pain 6-7/10 at cubital tunnel and volar wrist and Guyons canal- NOW  Goal status progressing  6.  Thumb active range of motion improved to within functional limits to be able to do palmar abduction to grasp around 4 cm object as well as increased opposition to pick up medication Baseline: Patient lacking 1 to 2 cm for opposition to second digit.  Unable to do opposition to any digits.  Decreased palmar abduction.  Radial abduction 45 degrees Goal status: Progressing    ASSESSMENT:  CLINICAL IMPRESSION: Patient patient followed by OT for increased weakness, increased pain and numbness in right dominant upper extremity since after surgery.  Patient had on 10/08/2023 a R VATS nerve sheath tumor excision -and hx of nephrectomy .  Patient present with increased pain and tenderness over right cubital tunnel as well as Guyon canal and carpal tunnel Patient with decreased coordination and proprioception and range of motion of right shoulder and elbow in ADLs and IADLs.  Patient with decreased range of motion and strength in her right wrist ,digits and thumb.  Most weakness stiffness in thumb palmar abduction, digit flexion extension as well as interosseous.  NOW  Since using MC flexion night splint patient with increased passive range of motion unable to get composite passive range of motion touching palm.  Patient to bring [facility] flexion brace in next time to adjust or reassessed.  If  needed increase MP flexion.  Focus today on digit extension as well as thumb palmar abduction and strengthening of thumb and palmar radial abduction incomplete pronation of grip as well as wrist flexion  extension supination and pronation.  Tolerated well.  Patient to continue with red flex bar for wrist and forearm  in all  planes - with flexion glove - Needed min to mod assist for home exercises patient limited in functional use of right upper extremity in ADLs and IADLs.  Patient can benefit from skilled OT services to decrease pain and numbness increase motion and strength to return to prior level of function.  PERFORMANCE DEFICITS: in functional skills including ADLs, IADLs, coordination, dexterity, proprioception, sensation, edema, ROM, strength, pain, flexibility, Fine motor control, Gross motor control, decreased knowledge of precautions, and decreased knowledge of use of DME,  IMPAIRMENTS: are limiting patient from ADLs, IADLs, rest and sleep, work, play, leisure, and social participation.   COMORBIDITIES: may have co-morbidities  that affects occupational performance. Patient will benefit from skilled OT to address above impairments and improve overall function.  MODIFICATION OR ASSISTANCE TO COMPLETE EVALUATION: Min-Moderate modification of tasks or assist with assess necessary to complete an evaluation.  OT OCCUPATIONAL PROFILE AND HISTORY: Detailed assessment: Review of records and additional review of physical, cognitive, psychosocial history related to current functional performance.  CLINICAL DECISION MAKING: Moderate - several treatment options, min-mod task modification necessary  REHAB POTENTIAL: Good for goals  EVALUATION COMPLEXITY: Moderate      PLAN:  OT FREQUENCY: 1-2x/week  OT DURATION: 12 weeks  PLANNED INTERVENTIONS: 97168 OT Re-evaluation, 97535 self care/ADL training, 02889 therapeutic exercise, 97530 therapeutic activity, 97112 neuromuscular re-education, 97140  manual therapy, 97039 fluidotherapy, 97034 contrast bath, 97033 iontophoresis, passive range of motion, patient/family education, and DME and/or AE instructions    CONSULTED AND AGREED WITH PLAN OF CARE: Patient   Ancel Peters, OTR/L,CLT 03/31/2024, 5:48 PM

## 2024-04-03 ENCOUNTER — Ambulatory Visit: Admitting: Occupational Therapy

## 2024-04-03 DIAGNOSIS — M25641 Stiffness of right hand, not elsewhere classified: Secondary | ICD-10-CM | POA: Diagnosis not present

## 2024-04-03 DIAGNOSIS — M25631 Stiffness of right wrist, not elsewhere classified: Secondary | ICD-10-CM | POA: Diagnosis not present

## 2024-04-03 DIAGNOSIS — M6281 Muscle weakness (generalized): Secondary | ICD-10-CM | POA: Diagnosis not present

## 2024-04-03 DIAGNOSIS — M79601 Pain in right arm: Secondary | ICD-10-CM

## 2024-04-03 NOTE — Therapy (Signed)
 OUTPATIENT OCCUPATIONAL THERAPY ORTHO TREATMENT  Patient Name: Daniel Lawrence MRN: [id number] DOB:04/16/1961, 63 y.o., male Today's Date: 04/03/2024  PCP: Dr Marylynn REFERRING PROVIDER:Dr Valli  END OF SESSION:  OT End of Session - 04/03/24 1538     Visit Number 35    Number of Visits 40    Date for Recertification  05/21/24    OT Start Time 1035    OT Stop Time 1125    OT Time Calculation (min) 50 min    Activity Tolerance Patient tolerated treatment well    Behavior During Therapy [facility] for tasks assessed/performed              Past Medical History:  Diagnosis Date   Allergy    seasonal   Arthritis    left knee   Cancer (HCC)    skin cancer   Cataract    Hyperlipidemia    Hypertension    Past Surgical History:  Procedure Laterality Date   BRAIN SURGERY  1971   COLONOSCOPY  05/03/2021   ELEVATION OF DEPRESSED SKULL FRACTURE  07/16/1969   traumatic blow to head by golf club   , [facility])   MOHS SURGERY  09/14/2011   left temple, squamous   ROBOT ASSISTED LAPAROSCOPIC NEPHRECTOMY Right 04/20/2016   Procedure: XI ROBOTIC ASSISTED LAPAROSCOPIC RADICAL NEPHRECTOMY;  Surgeon: Lawrence Likens, MD;  Location: [facility];  Service: Urology;  Laterality: Right;   Patient Active Problem List   Diagnosis Date Noted   Olecranon bursitis, right elbow 02/11/2024   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 12/26/2023   Physical deconditioning 12/26/2023   Tubular adenoma of colon 05/29/2023   Hearing loss due to cerumen impaction, right 05/29/2023   AC (acromioclavicular) arthritis 02/27/2023   Increased prostate specific antigen (PSA) velocity 07/03/2022   Lipoma of right shoulder 05/21/2022   Low back pain 09/15/2021   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   Aortic atherosclerosis (HCC) 04/18/2021   Melanoma in situ of right upper arm (HCC) 04/18/2021   H/O right nephrectomy 04/17/2020   Degenerative arthritis of left knee 02/25/2018   Chronic pain of left  knee 02/08/2018   CKD (chronic kidney disease) stage 3, GFR 30-59 ml/min (HCC) 02/08/2018   Coronary atherosclerosis due to lipid rich plaque 05/26/2016   History of renal cell carcinoma 04/20/2016   B12 deficiency 04/08/2016   Encounter for preventive health examination 06/24/2014   Hypertension 06/23/2014   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  But I can tell I can use my hand now more to hold onto the railing for going up the stairs, I got these attachments that I can tie my shoes now.  I can hold the steering wheel little bit more of my fingers.  But did you see the change my EMG appointment to December or January now.  I need to call the doctor's office  Pt accompanied by: self  PERTINENT HISTORY: Patient had on 10/08/2023 by Dr. 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  Mostly at At medial elbow and wrist and hand. PRECAUTIONS: Patient to follow surgeon's precautions    WEIGHT BEARING RESTRICTIONS: no  PAIN:  Are you having pain?  Denies nerve pain mostly at times shooting nerve pain or in the ulnar hand and fifth finger  FALLS: Has patient fallen in last 6 months? No  LIVING ENVIRONMENT: Lives with: lives with their  spouse  PLOF: Patient prior to surgery had normal active range of motion and strength.  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  Working and teaching yoga and doing things around the house  NEXT MD VISIT:?  OBJECTIVE:  Note: Objective measures were completed at Evaluation unless otherwise noted.  HAND DOMINANCE: Right  ADLs: Patient mostly limited by wrist and hand weakness.  Unable to grip objects and pick it up with weight as well as fine motor unable to pick up small objects.    UPPER EXTREMITY ROM at eval     Patient with decreased proprioception and coordination in right upper extremity range of motion.  Slow and deliberate. Pain and tenderness over right cubital tunnel Increased pain and tenderness over Guyon's canal As well as tenderness over carpal tunnel at volar wrist Bruising present over dorsal wrist. Patient reports he had some IVs in dorsal R wrist  And had a lot of swelling in R hand and wrist postop composite nerve glide and shoulder abduction had increased pain at stage 4 of 5.  Pain from elbow into wrist and hand.    Active ROM Right eval Left eval  Shoulder flexion    Shoulder abduction    Shoulder adduction    Shoulder extension    Shoulder internal rotation    Shoulder external rotation    Elbow flexion    Elbow extension    Wrist flexion 84   Wrist extension 64   Wrist ulnar deviation 20   Wrist radial deviation 25   Wrist pronation 90   Wrist supination 90      At eval  Extension or tapping of digits on right hand of table needs passive range of motion and placed on hold for second digit.  3rd through 5th 3 -/5 strength Lumbrical fist decreased PIP extension Intrinsic a fist within functional limits 3rd through 5th decrease in second digit Decrease strength for dorsal and volar interossei Composite fist  three-quarter range PROM Opening of digits limited at PIP extension after composite fist.  02/27/24:    Active ROM Right eval Left eval R 10/31/23 R 11/05/23 R/12/03/23 R 12/17/23 Place and hold  R 01/16/24  Thumb MCP (0-60) 35      48/ L 50  Thumb IP (0-80) 10      30 / L 70  Thumb Radial abd/add (0-55) WFL     WFL  55 including EPB  Thumb Palmar abd/add (0-45) impaired     Place and hold 45  Able to do AAROM over ball   Thumb Opposition to Small Finger Unable to do any opposition even to  second digit   Opposed to middle phalanges of second digit Pick up 2 cm foam block to 3rd      Index MCP (0-90)    75 80 80 80   Index PIP (0-100)    35 35 60 70   Index DIP (0-70)           Long MCP (0-90)     75 80 80 80   Long PIP (0-100)     60 65 65 70   Long DIP (0-70)           Ring MCP (0-90)     80 85 85 85   Ring PIP (0-100)     75 85 85 85   Ring DIP (0-70)           Little MCP (0-90)     80 90 90 90   Little PIP (0-100)     75 90 90 90   Little DIP (0-70)           (Blank rows = not tested)  HAND FUNCTION: Not tested evaluation  11/05/23 Grip strength: Right: 0 lbs; Left: 76 lbs, Lateral pinch: Right: 0 lbs, Left:   lbs, and 3 point pinch: Right: 0 lbs, Left:   lbs 02/03/24 Grip strength: Right: NT lbs; Left: 76 lbs, Lateral pinch: Right: 1 lbs, Left:   lbs, and 3 point pinch: Right: unable  lbs, Left:   lbs  COORDINATION: Impaired unable to make composite fist unable to do 2. Or 3 point pinch  SENSATION: Numbness in ulnar forearm into hand and fifth digit and ulnar side of fourth Report some pins-and-needles in DIP of third Patient with increased pain and tenderness over cubital tunnel as well as Guyon's canal.  Tenderness over volar wrist carpal tunnel  12/05/23: [place] Speed was done.  Patient feel only deep pressure on ulnar side of distal upper arm elbow forearm and hand. Also not able to fill hot and cold. 12/20/23 Semmes-Weinstein this date on ulnar upper arm to mid forearm  4.56  consistent and delayed but able to identify 4.31  01/31/24 Semmes-Weinstein this date on ulnar upper arm to mid forearm 4.56  consistent and delayed but able to identify 4.31  02/13/24 Semmes-Weinstein this date on ulnar upper arm to 4.31;  proximal 2/3 forearm 4.56, deep pressure tat ulnar  DPC of hand and 2.83 normal sensation thumb thru 4th  02/27/24 Semmes-Weinstein this date on ulnar upper arm to  elbow and proximal 1/3 of forearm 4.31;  distal 2/3 forearm 4.56, deep pressure tat ulnar  DPC of hand and 2.83 normal sensation thumb thru 4th ; 4.56 on 5th   Positive Tinel DPC base of 5th   EDEMA: Patient report had severe edema postop in right hand and wrist.  Continue to have some bruising over her dorsal wrist  COGNITION: Overall cognitive status: WNL      TREATMENT DATE: 04/03/24      RE CERT measurements 02/27/24 :   R shoulder abduction and external rotation 4 -/5 Elbow flexion extension within normal limits Wrist flexion 4 FCU and extension for ECU 4 -/5 Pronation and supination 4 -/5  Opposition to middle phalanges of second and able to pick up 3 cm up check with opposition to 3rd through 5th although lateral side Thumb radial abduction 55 -4 -/5  palmar abduction with gravity 40 degrees --Unable to take any resistance for palmar abduction 2+/5 Thumb IP flexion 40 and MCP 48 degrees  PROM to digits-MCPs 2nd through 5th as follow 65, 60, 60, 55 degrees PIP flexion for second 3/5 as follow 50, 75, 75, 70 Patient the last 2 weeks able to initiate grasping lacrosse ball or size of a golf ball pushing into putty pulling it off if less than 2 seconds push Initiating some digit extension with Velcro board and into the putty light blue  Session today :   Patient arrives rough sensory test done patient able to palpate pressure as well as movement with light pressure on ulnar hand and fifth digit Patient is a positive Tinel and tenderness over the carpal tunnel.  Patient  compensate with attempts of grip strengthening with pressure over the carpal tunnel like with gripping with the BTE or flex bar. Patient continues to show with tenolysis movement with wrist extension and increased flexion of digit with wrist flexion decrease flexion of digits  Done paraffin bath for moist deep heat this date for 8 minutes with a [place] flexion wrap to digits to increase MCP and PIP flexion. Followed by joint mobilization by OT on metacarpals as well as gentle traction into flexion same with joint mobilization with traction with facilitation of PIP flexion Followed by weightbearing through the dorsal proximal phalanges to increase MCP flexion Prior To OT modifying MC flexion brace for nighttime to increase flexion to 65 to 70 degrees of flexion with focus on dorsal proximal phalanges to go away splinting dorsally at middle and distal phalanges-to have a flush fat on the dorsal proximal phalanges and metacarpals.  With a Velcro strap at the wrist through the webspace as well as 1 over 2nd and 3rd digit to volar wrist To maintain flexion Patient to sleep with flexion  wear at 6 to 8 hours at night to facilitate more MC flexion    Continue with HEP: Upgrade today to flexion glove around flex bar RED  easy focusing on supination and pronation as well as ulnar deviation incorporating grip with flexion glove Also with wrist flexion extension alternating  3 sets of 12  1 x day  Show increase ECU and FCU since 8/14                                                                                    PATIENT EDUCATION: Education details: Progressing changes in  HEP  Person educated: Patient Education method: Explanation, Demonstration, Tactile cues, Verbal cues, and Handouts Education comprehension: verbalized understanding, returned demonstration, verbal cues required, and needs further education    GOALS: Goals reviewed with patient? Yes  LONG TERM GOALS: Target date:  12 wks   Patient to be independent in home program to modify right upper extremity in ADLs and IADLs as well as positioning during the day and night to decrease irritation on cubital tunnel and volar wrist with decreased pain Baseline: Pain over the cubital tunnel and Guyon's canal 6-7/10.  With increased tenderness and pain increased numbness over her ulnar forearm wrist and hand into fifth and ulnar side of fourth digit Goal status: Met  2.  Patient to be independent in home program to increase proprioception and ease  of right shoulder, elbow range of motion to normal  reaching pattern  Baseline: Patient showed decreased coordination and proprioception in the reaching with the right upper extremity compensating with trunk and scapula Goal status: Met  3.  Right wrist and forearm strength increased to 4+ to initiate strengthening and carrying groceries less than 4 pounds within no increase symptoms Baseline: Active range of motion within functional limits for supination pronation  but decreased wrist extension/ flexion decreased strength in ulnar deviation Goal status: Progressing  4.  Digit active range of motion increased for patient to make composite fist and open hand to grasp around toothbrush and release, put hand in pocket Baseline: Three-quarter of a fist.  Decreased PIP extension with a lumbrical fist.  Decreased intrinsic first and second digit unable to touch any fingertips with opposition.  Numbness on ulnar side of forearm into the wrist hand fifth digit and ulnar fourth Goal status: Progressing  5.  Patient to be independent home program to decrease pain and tenderness from elbow to hand to less than 2/10 Baseline: Pain 6-7/10 at cubital tunnel and volar wrist and Guyons canal- NOW  Goal status progressing  6.  Thumb active range of motion improved to within functional limits to be able to do palmar abduction to grasp around 4 cm object as well as increased opposition to pick up  medication Baseline: Patient lacking 1 to 2 cm for opposition to second digit.  Unable to do opposition to any digits.  Decreased palmar abduction.  Radial abduction 45 degrees Goal status: Progressing    ASSESSMENT:  CLINICAL IMPRESSION: Patient patient followed by OT for increased weakness, increased pain and numbness in right dominant upper extremity since after surgery.  Patient had on 10/08/2023 a R VATS nerve sheath tumor excision -and hx of nephrectomy .  Patient present with increased pain and tenderness over right cubital tunnel as well as Guyon canal and carpal tunnel Patient with decreased coordination and proprioception and range of motion of right shoulder and elbow in ADLs and IADLs.  Patient with decreased range of motion and strength in her right wrist ,digits and thumb.  Most weakness stiffness in thumb palmar abduction, digit flexion extension as well as interosseous.  NOW  Since using MC flexion night splint patient with increased passive range of motion -patient brought in and MC flexion night splint was modified to increase flexion to 65 to 70 degrees as well as shorter and to only apply pressure at dorsal proximal phalanges.  Patient to continue to wear 6 to 8 hours at nighttime.  Focus last visit on digit extension as well as thumb palmar abduction and strengthening of thumb and palmar radial abduction incomplete pronation of grip as well as wrist flexion extension supination and pronation.  Tolerated well.  Patient to continue with red flex bar for wrist and forearm  in all  planes - with flexion glove - Needed min to mod assist for home exercises patient limited in functional use of right upper extremity in ADLs and IADLs.  Patient can benefit from skilled OT services to decrease pain and numbness increase motion and strength to return to prior level of function.  PERFORMANCE DEFICITS: in functional skills including ADLs, IADLs, coordination, dexterity, proprioception, sensation,  edema, ROM, strength, pain, flexibility, Fine motor control, Gross motor control, decreased knowledge of precautions, and decreased knowledge of use of DME,  IMPAIRMENTS: are limiting patient from ADLs, IADLs, rest and sleep, work, play, leisure, and social participation.   COMORBIDITIES: may have co-morbidities  that affects occupational performance. Patient will  benefit from skilled OT to address above impairments and improve overall function.  MODIFICATION OR ASSISTANCE TO COMPLETE EVALUATION: Min-Moderate modification of tasks or assist with assess necessary to complete an evaluation.  OT OCCUPATIONAL PROFILE AND HISTORY: Detailed assessment: Review of records and additional review of physical, cognitive, psychosocial history related to current functional performance.  CLINICAL DECISION MAKING: Moderate - several treatment options, min-mod task modification necessary  REHAB POTENTIAL: Good for goals  EVALUATION COMPLEXITY: Moderate      PLAN:  OT FREQUENCY: 1-2x/week  OT DURATION: 12 weeks  PLANNED INTERVENTIONS: 97168 OT Re-evaluation, 97535 self care/ADL training, 02889 therapeutic exercise, 97530 therapeutic activity, 97112 neuromuscular re-education, 97140 manual therapy, 97039 fluidotherapy, 97034 contrast bath, 97033 iontophoresis, passive range of motion, patient/family education, and DME and/or AE instructions    CONSULTED AND AGREED WITH PLAN OF CARE: Patient   Ancel Peters, OTR/L,CLT 04/03/2024, 3:39 PM

## 2024-04-07 ENCOUNTER — Ambulatory Visit: Admitting: Occupational Therapy

## 2024-04-13 ENCOUNTER — Ambulatory Visit: Admitting: Occupational Therapy

## 2024-04-13 DIAGNOSIS — M6281 Muscle weakness (generalized): Secondary | ICD-10-CM

## 2024-04-13 DIAGNOSIS — M25641 Stiffness of right hand, not elsewhere classified: Secondary | ICD-10-CM | POA: Diagnosis not present

## 2024-04-13 DIAGNOSIS — M79601 Pain in right arm: Secondary | ICD-10-CM

## 2024-04-13 DIAGNOSIS — M25631 Stiffness of right wrist, not elsewhere classified: Secondary | ICD-10-CM

## 2024-04-13 NOTE — Therapy (Signed)
 OUTPATIENT OCCUPATIONAL THERAPY ORTHO TREATMENT  Patient Name: Daniel Lawrence MRN: [id number] DOB:06-01-1961, 63 y.o., male Today's Date: 04/13/2024  PCP: Dr Marylynn REFERRING PROVIDER:Dr Valli  END OF SESSION:  OT End of Session - 04/13/24 1356     Visit Number 36    Number of Visits 40    Date for Recertification  05/21/24    OT Start Time 1001    OT Stop Time 1045    OT Time Calculation (min) 44 min    Activity Tolerance Patient tolerated treatment well    Behavior During Therapy [facility] for tasks assessed/performed              Past Medical History:  Diagnosis Date   Allergy    seasonal   Arthritis    left knee   Cancer (HCC)    skin cancer   Cataract    Hyperlipidemia    Hypertension    Past Surgical History:  Procedure Laterality Date   BRAIN SURGERY  1971   COLONOSCOPY  05/03/2021   ELEVATION OF DEPRESSED SKULL FRACTURE  07/16/1969   traumatic blow to head by golf club   , [facility])   MOHS SURGERY  09/14/2011   left temple, squamous   ROBOT ASSISTED LAPAROSCOPIC NEPHRECTOMY Right 04/20/2016   Procedure: XI ROBOTIC ASSISTED LAPAROSCOPIC RADICAL NEPHRECTOMY;  Surgeon: Ricardo Likens, MD;  Location: [facility];  Service: Urology;  Laterality: Right;   Patient Active Problem List   Diagnosis Date Noted   Olecranon bursitis, right elbow 02/11/2024   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 12/26/2023   Physical deconditioning 12/26/2023   Tubular adenoma of colon 05/29/2023   Hearing loss due to cerumen impaction, right 05/29/2023   AC (acromioclavicular) arthritis 02/27/2023   Increased prostate specific antigen (PSA) velocity 07/03/2022   Lipoma of right shoulder 05/21/2022   Low back pain 09/15/2021   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   Aortic atherosclerosis 04/18/2021   Melanoma in situ of right upper arm (HCC) 04/18/2021   H/O right nephrectomy 04/17/2020   Degenerative arthritis of left knee 02/25/2018   Chronic pain of left knee  02/08/2018   CKD (chronic kidney disease) stage 3, GFR 30-59 ml/min (HCC) 02/08/2018   Coronary atherosclerosis due to lipid rich plaque 05/26/2016   History of renal cell carcinoma 04/20/2016   B12 deficiency 04/08/2016   Encounter for preventive health examination 06/24/2014   Hypertension 06/23/2014   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  But we had made tentatively for surgery.  But we wanted more objective measurements.  So they did move my EMG up again but my appointment after that so both about metal October  pt accompanied by: self  PERTINENT HISTORY: Patient had on 10/08/2023 by Dr. 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  Mostly at At medial elbow and wrist and hand. PRECAUTIONS: Patient to follow surgeon's precautions    WEIGHT BEARING RESTRICTIONS: no  PAIN:  Are you having pain?  Denies nerve pain mostly at times shooting nerve pain or in the ulnar hand and fifth finger  FALLS: Has patient fallen in last 6 months? No  LIVING ENVIRONMENT: Lives with: lives with their spouse  PLOF: Patient prior to surgery had normal active range of motion and strength.  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  Working and teaching yoga and doing things around the house  NEXT MD VISIT:?  OBJECTIVE:  Note: Objective measures were completed at Evaluation unless otherwise noted.  HAND DOMINANCE: Right  ADLs: Patient mostly limited by wrist and hand weakness.  Unable to grip objects and pick it up with weight as well as fine motor unable to pick up small objects.    UPPER EXTREMITY ROM at eval     Patient with decreased proprioception and coordination in right upper extremity range of motion.  Slow and deliberate. Pain and tenderness over right cubital tunnel Increased pain and tenderness over Guyon's canal As well as tenderness over carpal tunnel at volar wrist Bruising present over dorsal wrist. Patient reports he had some IVs in dorsal R wrist  And had a lot of swelling in R hand and wrist postop composite nerve glide and shoulder abduction had increased pain at stage 4 of 5.  Pain from elbow into wrist and hand.    Active ROM Right eval Left eval  Shoulder flexion    Shoulder abduction    Shoulder adduction    Shoulder extension    Shoulder internal rotation    Shoulder external rotation    Elbow flexion    Elbow extension    Wrist flexion 84   Wrist extension 64   Wrist ulnar deviation 20   Wrist radial deviation 25   Wrist pronation 90   Wrist supination 90      At eval  Extension or tapping of digits on right hand of table needs passive range of motion and placed on hold for second digit.  3rd through 5th 3 -/5 strength Lumbrical fist decreased PIP extension Intrinsic a fist within functional limits 3rd through 5th decrease in second digit Decrease strength for dorsal and volar interossei Composite fist three-quarter range PROM Opening of digits limited at PIP extension after composite fist.  02/27/24:    Active ROM Right eval Left eval R 10/31/23 R 11/05/23 R/12/03/23 R 12/17/23 Place and hold  R 01/16/24  Thumb MCP (0-60) 35       48/ L 50  Thumb IP (0-80) 10      30 / L 70  Thumb Radial abd/add (0-55) WFL     WFL  55 including EPB  Thumb Palmar abd/add (0-45) impaired     Place and hold 45  Able to do AAROM over ball   Thumb Opposition to Small Finger Unable to do any opposition even to second digit   Opposed to middle phalanges of second digit Pick up 2 cm foam block to 3rd      Index MCP (0-90)    75 80 80 80   Index PIP (0-100)    35 35 60 70   Index DIP (0-70)  Long MCP (0-90)     75 80 80 80   Long PIP (0-100)     60 65 65 70   Long DIP (0-70)           Ring MCP (0-90)     80 85 85 85   Ring PIP (0-100)     75 85 85 85   Ring DIP (0-70)           Little MCP (0-90)     80 90 90 90   Little PIP (0-100)     75 90 90 90   Little DIP (0-70)           (Blank rows = not tested)  HAND FUNCTION: Not tested evaluation  11/05/23 Grip strength: Right: 0 lbs; Left: 76 lbs, Lateral pinch: Right: 0 lbs, Left:   lbs, and 3 point pinch: Right: 0 lbs, Left:   lbs 02/03/24 Grip strength: Right: NT lbs; Left: 76 lbs, Lateral pinch: Right: 1 lbs, Left:   lbs, and 3 point pinch: Right: unable  lbs, Left:   lbs  COORDINATION: Impaired unable to make composite fist unable to do 2. Or 3 point pinch  SENSATION: Numbness in ulnar forearm into hand and fifth digit and ulnar side of fourth Report some pins-and-needles in DIP of third Patient with increased pain and tenderness over cubital tunnel as well as Guyon's canal.  Tenderness over volar wrist carpal tunnel  12/05/23: [place] Speed was done.  Patient feel only deep pressure on ulnar side of distal upper arm elbow forearm and hand. Also not able to fill hot and cold. 12/20/23 Semmes-Weinstein this date on ulnar upper arm to mid forearm 4.56  consistent and delayed but able to identify 4.31  01/31/24 Semmes-Weinstein this date on ulnar upper arm to mid forearm 4.56  consistent and delayed but able to identify 4.31  02/13/24 Semmes-Weinstein this date on ulnar upper  arm to 4.31;  proximal 2/3 forearm 4.56, deep pressure tat ulnar  DPC of hand and 2.83 normal sensation thumb thru 4th  02/27/24 Semmes-Weinstein this date on ulnar upper arm to  elbow and proximal 1/3 of forearm 4.31;  distal 2/3 forearm 4.56, deep pressure tat ulnar  DPC of hand and 2.83 normal sensation thumb thru 4th ; 4.56 on 5th   Positive Tinel DPC base of 5th   EDEMA: Patient report had severe edema postop in right hand and wrist.  Continue to have some bruising over her dorsal wrist  COGNITION: Overall cognitive status: WNL          RE CERT measurements 02/27/24 :   R shoulder abduction and external rotation 4 -/5 Elbow flexion extension within normal limits Wrist flexion 4 FCU and extension for ECU 4 -/5 Pronation and supination 4 -/5  Opposition to middle phalanges of second and able to pick up 3 cm up check with opposition to 3rd through 5th although lateral side Thumb radial abduction 55 -4 -/5  palmar abduction with gravity 40 degrees --Unable to take any resistance for palmar abduction 2+/5 Thumb IP flexion 40 and MCP 48 degrees  PROM to digits-MCPs 2nd through 5th as follow 65, 60, 60, 55 degrees PIP flexion for second 3/5 as follow 50, 75, 75, 70 Patient the last 2 weeks able to initiate grasping lacrosse ball or size of a golf ball pushing into putty pulling it off if less than 2 seconds push Initiating some digit extension with Velcro board and into the putty light  blue   TREATMENT DATE: 04/13/24    Done composite flexion wrap with wife Coban wrap for increased MCP flexion.  Followed by patient doing weightbearing through the proximal dorsal phalanges to facilitate MCP flexion stretch.   As well as stretches and passive range of motion with MCP flexion by OT followed by composite flexion   Followed by joint mobilization by OT on metacarpals as well as gentle traction into flexion same with joint mobilization with traction with facilitation of PIP  flexion  Focused facilitation of palmar abduction this date.  Patient show improvement when reaching for 2 cylinder objects next to each other. 6ich circumference better than 8 inch. Facilitate any attempts palmar abduction of the thumb with lacrosse ball.  And 1 kg weighted ball. Or with a rubber band for resistance.  But not as successful and needed tactile cueing as well as active assisted motion Work on placing hold and palmar abduction with 1 kg weighted ball.  This date patient fitted with a CMC thumb brace prefab to support thumb CMC for patient to facilitate and able to use palmar abduction more during the day. Patient can did not do any resistance. But upon attempt in use with [facility] brace patient was able to grasp around in opposition lacrosse ball balance palm down and catch 2 out of 10 balls. Patient also more ease picking up 1 kg ball with palmar abduction as well as lifting it incorporating elbow flexion as well as radial deviation and wrist extension.  Patient to use [facility] prefab brace to use palmar abduction grasping around large light blue putty maintaining wrist neutral as well as thumb palmar abduction gripping putty. Compensating less with wrist extension as well as irritating carpal tunnel.  Patient still hard time with enlarged grip on cylinder to grasp and perform Velcro board pronation supination into combination with cylinder grip.  Limited more in pronation and supination because of thumb palmar abduction  Continue with HEP: Upgrade today to flexion glove around flex bar RED  easy focusing on supination and pronation as well as ulnar deviation incorporating grip with flexion glove Also with wrist flexion extension alternating  3 sets of 12  1 x day  Show increase ECU and FCU since 8/14                                                                                    PATIENT EDUCATION: Education details: Progressing changes in  HEP  Person educated:  Patient Education method: Programmer, multimedia, Demonstration, Tactile cues, Verbal cues, and Handouts Education comprehension: verbalized understanding, returned demonstration, verbal cues required, and needs further education    GOALS: Goals reviewed with patient? Yes  LONG TERM GOALS: Target date: 12 wks   Patient to be independent in home program to modify right upper extremity in ADLs and IADLs as well as positioning during the day and night to decrease irritation on cubital tunnel and volar wrist with decreased pain Baseline: Pain over the cubital tunnel and Guyon's canal 6-7/10.  With increased tenderness and pain increased numbness over her ulnar forearm wrist and hand into fifth and ulnar side of fourth digit Goal status: Met  2.  Patient  to be independent in home program to increase proprioception and ease  of right shoulder, elbow range of motion to normal reaching pattern  Baseline: Patient showed decreased coordination and proprioception in the reaching with the right upper extremity compensating with trunk and scapula Goal status: Met  3.  Right wrist and forearm strength increased to 4+ to initiate strengthening and carrying groceries less than 4 pounds within no increase symptoms Baseline: Active range of motion within functional limits for supination pronation  but decreased wrist extension/ flexion decreased strength in ulnar deviation Goal status: Progressing  4.  Digit active range of motion increased for patient to make composite fist and open hand to grasp around toothbrush and release, put hand in pocket Baseline: Three-quarter of a fist.  Decreased PIP extension with a lumbrical fist.  Decreased intrinsic first and second digit unable to touch any fingertips with opposition.  Numbness on ulnar side of forearm into the wrist hand fifth digit and ulnar fourth Goal status: Progressing  5.  Patient to be independent home program to decrease pain and tenderness from elbow to hand  to less than 2/10 Baseline: Pain 6-7/10 at cubital tunnel and volar wrist and Guyons canal- NOW  Goal status progressing  6.  Thumb active range of motion improved to within functional limits to be able to do palmar abduction to grasp around 4 cm object as well as increased opposition to pick up medication Baseline: Patient lacking 1 to 2 cm for opposition to second digit.  Unable to do opposition to any digits.  Decreased palmar abduction.  Radial abduction 45 degrees Goal status: Progressing    ASSESSMENT:  CLINICAL IMPRESSION: Patient patient followed by OT for increased weakness, increased pain and numbness in right dominant upper extremity since after surgery.  Patient had on 10/08/2023 a R VATS nerve sheath tumor excision -and hx of nephrectomy .  Patient present with increased pain and tenderness over right cubital tunnel as well as Guyon canal and carpal tunnel Patient with decreased coordination and proprioception and range of motion of right shoulder and elbow in ADLs and IADLs.  Patient with decreased range of motion and strength in her right wrist ,digits and thumb.  Most weakness stiffness in thumb palmar abduction, digit flexion extension as well as interosseous.  NOW  Since using MC flexion night splint patient with increased passive range of motion -patient brought in and MC flexion night splint was modified to increase flexion to 65 to 70 degrees as well as shorter and to only apply pressure at dorsal proximal phalanges.  Patient to continue to wear 6 to 8 hours at nighttime.  This date focused and facilitating of palmar abduction.  Patient continues to be difficulty with initiating palmar abduction as well as doing any resistance.  Patient does better when reaching between 2 cylinder objects facilitating some palmar abduction.  Fitted patient with a CMC prefab brace to position and support thumb CMC for patient to be able to use into more functional strengthening with palmar abduction  during the day.  Patient reported more ease with holding objects with thumb and opposition.  Do need some placing hold at times patient to continue with red flex bar for wrist and forearm  in all  planes - with flexion glove - Needed min to mod assist for home exercises patient limited in functional use of right upper extremity in ADLs and IADLs.  Patient can benefit from skilled OT services to decrease pain and numbness increase motion and  strength to return to prior level of function.  PERFORMANCE DEFICITS: in functional skills including ADLs, IADLs, coordination, dexterity, proprioception, sensation, edema, ROM, strength, pain, flexibility, Fine motor control, Gross motor control, decreased knowledge of precautions, and decreased knowledge of use of DME,  IMPAIRMENTS: are limiting patient from ADLs, IADLs, rest and sleep, work, play, leisure, and social participation.   COMORBIDITIES: may have co-morbidities  that affects occupational performance. Patient will benefit from skilled OT to address above impairments and improve overall function.  MODIFICATION OR ASSISTANCE TO COMPLETE EVALUATION: Min-Moderate modification of tasks or assist with assess necessary to complete an evaluation.  OT OCCUPATIONAL PROFILE AND HISTORY: Detailed assessment: Review of records and additional review of physical, cognitive, psychosocial history related to current functional performance.  CLINICAL DECISION MAKING: Moderate - several treatment options, min-mod task modification necessary  REHAB POTENTIAL: Good for goals  EVALUATION COMPLEXITY: Moderate      PLAN:  OT FREQUENCY: 1-2x/week  OT DURATION: 12 weeks  PLANNED INTERVENTIONS: 97168 OT Re-evaluation, 97535 self care/ADL training, 02889 therapeutic exercise, 97530 therapeutic activity, 97112 neuromuscular re-education, 97140 manual therapy, 97039 fluidotherapy, 97034 contrast bath, 97033 iontophoresis, passive range of motion, patient/family  education, and DME and/or AE instructions    CONSULTED AND AGREED WITH PLAN OF CARE: Patient   Ancel Peters, OTR/L,CLT 04/13/2024, 2:00 PM

## 2024-04-16 ENCOUNTER — Ambulatory Visit: Attending: Thoracic Surgery (Cardiothoracic Vascular Surgery) | Admitting: Occupational Therapy

## 2024-04-16 DIAGNOSIS — M25631 Stiffness of right wrist, not elsewhere classified: Secondary | ICD-10-CM | POA: Diagnosis not present

## 2024-04-16 DIAGNOSIS — M6281 Muscle weakness (generalized): Secondary | ICD-10-CM | POA: Diagnosis not present

## 2024-04-16 DIAGNOSIS — M25641 Stiffness of right hand, not elsewhere classified: Secondary | ICD-10-CM | POA: Insufficient documentation

## 2024-04-16 DIAGNOSIS — M79601 Pain in right arm: Secondary | ICD-10-CM | POA: Diagnosis not present

## 2024-04-16 NOTE — Therapy (Signed)
 OUTPATIENT OCCUPATIONAL THERAPY ORTHO TREATMENT  Patient Name: Daniel Lawrence MRN: [id number] DOB:[birth year], 63 y.o., male Today's Date: 04/16/2024  PCP: Dr Marylynn REFERRING PROVIDER:Dr Valli  END OF SESSION:  OT End of Session - 04/16/24 2125     Visit Number 37    Number of Visits 40    Date for Recertification  05/21/24    OT Start Time 0902    OT Stop Time 0947    OT Time Calculation (min) 45 min    Activity Tolerance Patient tolerated treatment well    Behavior During Therapy [facility] for tasks assessed/performed              Past Medical History:  Diagnosis Date   Allergy    seasonal   Arthritis    left knee   Cancer (HCC)    skin cancer   Cataract    Hyperlipidemia    Hypertension    Past Surgical History:  Procedure Laterality Date   BRAIN SURGERY  1971   COLONOSCOPY  05/03/2021   ELEVATION OF DEPRESSED SKULL FRACTURE  07/16/1969   traumatic blow to head by golf club   , [facility])   MOHS SURGERY  09/14/2011   left temple, squamous   ROBOT ASSISTED LAPAROSCOPIC NEPHRECTOMY Right 04/20/2016   Procedure: XI ROBOTIC ASSISTED LAPAROSCOPIC RADICAL NEPHRECTOMY;  Surgeon: Ricardo Likens, MD;  Location: [facility];  Service: Urology;  Laterality: Right;   Patient Active Problem List   Diagnosis Date Noted   Olecranon bursitis, right elbow 02/11/2024   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 12/26/2023   Physical deconditioning 12/26/2023   Tubular adenoma of colon 05/29/2023   Hearing loss due to cerumen impaction, right 05/29/2023   AC (acromioclavicular) arthritis 02/27/2023   Increased prostate specific antigen (PSA) velocity 07/03/2022   Lipoma of right shoulder 05/21/2022   Low back pain 09/15/2021   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   Aortic atherosclerosis 04/18/2021   Melanoma in situ of right upper arm (HCC) 04/18/2021   H/O right nephrectomy 04/17/2020   Degenerative arthritis of left knee 02/25/2018   Chronic pain of left knee  02/08/2018   CKD (chronic kidney disease) stage 3, GFR 30-59 ml/min (HCC) 02/08/2018   Coronary atherosclerosis due to lipid rich plaque 05/26/2016   History of renal cell carcinoma 04/20/2016   B12 deficiency 04/08/2016   Encounter for preventive health examination 06/24/2014   Hypertension 06/23/2014   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  But the thumb CMC 1 I use a lot during the day like it.  I was able to zip up my sweater today.  Pt accompanied by: self  PERTINENT HISTORY: Patient had on 10/08/2023 by Dr. 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  Mostly at At medial elbow and wrist and hand. PRECAUTIONS: Patient to follow surgeon's precautions  WEIGHT BEARING RESTRICTIONS: no  PAIN:  Are you having pain?  Denies nerve pain mostly at times shooting nerve pain or in the ulnar hand and fifth finger  FALLS: Has patient fallen in last 6 months? No  LIVING ENVIRONMENT: Lives with: lives with their spouse  PLOF: Patient prior to surgery had normal active range of motion and strength.  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  Working and teaching yoga and doing things around the house  NEXT MD  VISIT:?  OBJECTIVE:  Note: Objective measures were completed at Evaluation unless otherwise noted.  HAND DOMINANCE: Right  ADLs: Patient mostly limited by wrist and hand weakness.  Unable to grip objects and pick it up with weight as well as fine motor unable to pick up small objects.    UPPER EXTREMITY ROM at eval     Patient with decreased proprioception and coordination in right upper extremity range of motion.  Slow and deliberate. Pain and tenderness over right cubital tunnel Increased pain and tenderness over Guyon's canal As well as tenderness over carpal tunnel at volar wrist Bruising present over dorsal wrist. Patient reports he had some IVs in dorsal R wrist  And had a lot of swelling in R hand and wrist postop composite nerve glide and shoulder abduction had increased pain at stage 4 of 5.  Pain from elbow into wrist and hand.    Active ROM Right eval Left eval  Shoulder flexion    Shoulder abduction    Shoulder adduction    Shoulder extension    Shoulder internal rotation    Shoulder external rotation    Elbow flexion    Elbow extension    Wrist flexion 84   Wrist extension 64   Wrist ulnar deviation 20   Wrist radial deviation 25   Wrist pronation 90   Wrist supination 90      At eval  Extension or tapping of digits on right hand of table needs passive range of motion and placed on hold for second digit.  3rd through 5th 3 -/5 strength Lumbrical fist decreased PIP extension Intrinsic a fist within functional limits 3rd through 5th decrease in second digit Decrease strength for dorsal and volar interossei Composite fist three-quarter range PROM Opening of digits limited at PIP extension after composite fist.  02/27/24:    Active ROM Right eval Left eval R 10/31/23 R 11/05/23 R/12/03/23 R 12/17/23 Place and hold  R 01/16/24  Thumb MCP (0-60) 35      48/ L 50  Thumb IP (0-80) 10      30 / L 70  Thumb Radial abd/add (0-55) WFL     WFL  55 including EPB   Thumb Palmar abd/add (0-45) impaired     Place and hold 45  Able to do AAROM over ball   Thumb Opposition to Small Finger Unable to do any opposition even to second digit   Opposed to middle phalanges of second digit Pick up 2 cm foam block to 3rd      Index MCP (0-90)    75 80 80 80   Index PIP (0-100)    35 35 60 70   Index DIP (0-70)           Long MCP (0-90)     75 80 80 80  Long PIP (0-100)     60 65 65 70   Long DIP (0-70)           Ring MCP (0-90)     80 85 85 85   Ring PIP (0-100)     75 85 85 85   Ring DIP (0-70)           Little MCP (0-90)     80 90 90 90   Little PIP (0-100)     75 90 90 90   Little DIP (0-70)           (Blank rows = not tested)  HAND FUNCTION: Not tested evaluation  11/05/23 Grip strength: Right: 0 lbs; Left: 76 lbs, Lateral pinch: Right: 0 lbs, Left:   lbs, and 3 point pinch: Right: 0 lbs, Left:   lbs 02/03/24 Grip strength: Right: NT lbs; Left: 76 lbs, Lateral pinch: Right: 1 lbs, Left:   lbs, and 3 point pinch: Right: unable  lbs, Left:   lbs  COORDINATION: Impaired unable to make composite fist unable to do 2. Or 3 point pinch  SENSATION: Numbness in ulnar forearm into hand and fifth digit and ulnar side of fourth Report some pins-and-needles in DIP of third Patient with increased pain and tenderness over cubital tunnel as well as Guyon's canal.  Tenderness over volar wrist carpal tunnel  12/05/23: [place] Speed was done.  Patient feel only deep pressure on ulnar side of distal upper arm elbow forearm and hand. Also not able to fill hot and cold. 12/20/23 Semmes-Weinstein this date on ulnar upper arm to mid forearm 4.56  consistent and delayed but able to identify 4.31  01/31/24 Semmes-Weinstein this date on ulnar upper arm to mid forearm 4.56  consistent and delayed but able to identify 4.31  02/13/24 Semmes-Weinstein this date on ulnar upper arm to 4.31;  proximal 2/3 forearm 4.56, deep pressure tat ulnar  DPC of hand and 2.83 normal sensation thumb  thru 4th  02/27/24 Semmes-Weinstein this date on ulnar upper arm to  elbow and proximal 1/3 of forearm 4.31;  distal 2/3 forearm 4.56, deep pressure tat ulnar  DPC of hand and 2.83 normal sensation thumb thru 4th ; 4.56 on 5th   Positive Tinel DPC base of 5th   EDEMA: Patient report had severe edema postop in right hand and wrist.  Continue to have some bruising over her dorsal wrist  COGNITION: Overall cognitive status: WNL          RE CERT measurements 02/27/24 :   R shoulder abduction and external rotation 4 -/5 Elbow flexion extension within normal limits Wrist flexion 4 FCU and extension for ECU 4 -/5 Pronation and supination 4 -/5  Opposition to middle phalanges of second and able to pick up 3 cm up check with opposition to 3rd through 5th although lateral side Thumb radial abduction 55 -4 -/5  palmar abduction with gravity 40 degrees --Unable to take any resistance for palmar abduction 2+/5 Thumb IP flexion 40 and MCP 48 degrees  PROM to digits-MCPs 2nd through 5th as follow 65, 60, 60, 55 degrees PIP flexion for second 3/5 as follow 50, 75, 75, 70 Patient the last 2 weeks able to initiate grasping lacrosse ball or size of a golf ball pushing into putty pulling it off if less than 2 seconds push Initiating some digit extension with Velcro board and into the putty light blue   TREATMENT DATE: 04/16/24    Done composite flexion wrap  with  Coban wrap for increased MCP flexion and composite during paraffin .  Followed by patient doing weightbearing through the proximal dorsal phalanges to facilitate MCP flexion stretch.   As well as stretches and passive range of motion with MCP flexion by OT followed by composite flexion   Followed by joint mobilization by OT on metacarpals as well as gentle traction into flexion same with joint mobilization with traction with facilitation of PIP flexion  Focused facilitation of palmar abduction this date.  Patient show improvement when  reaching for 2 cylinder objects next to each other. 6ich circumference better than 8 inch.  As well as lacrosse ball Facilitate attempts palmar abduction of the thumb with lacrosse ball.     placing hold with palmar abduction lacrosse ball and index finger This date practiced 1 cm object picking up with adduction between 2nd and 3rd, 3rd and 4th and 4th and 5th several times add to home exercises Also 2 cm object practicing opposition picking up all digits 2nd through 4th easier than fifth Add to home exercises Patient to practice thumb and second digit picking up and hold objects with palmar abduction of thumb 3 to 4 cm objects Without compensating or resting object on third digit at home program   Patient to continue wearing CMC thumb brace prefab to support thumb CMC for patient to facilitate and able to use palmar abduction more during the day.   Patient using palmar abduction grasping around large light blue putty maintaining wrist neutral as well as thumb palmar abduction gripping putty using more PIP flexion With less thumb palmar abduction patient able to do gripping more with PIP but also initiating MCP flexion 2 sets of 10 of both putty exercises. Compensating less with wrist extension as well as irritating carpal tunnel.  Patient this date showed increased flexion strength for large cylinder object pulling on Velcro board using digit flexion with in between patient back with digit extension 2nd and 3rd and then 4th and 5th 6-8 reps  Continue with HEP: Continue flexion glove around flex bar RED  easy focusing on supination and pronation as well as ulnar deviation incorporating grip with flexion glove Also with wrist flexion extension alternating  3 sets of 12  1 x day  Show increase ECU and FCU since 8/14                                                                                    PATIENT EDUCATION: Education details: Progressing changes in  HEP  Person  educated: Patient Education method: Programmer, multimedia, Demonstration, Tactile cues, Verbal cues, and Handouts Education comprehension: verbalized understanding, returned demonstration, verbal cues required, and needs further education    GOALS: Goals reviewed with patient? Yes  LONG TERM GOALS: Target date: 12 wks   Patient to be independent in home program to modify right upper extremity in ADLs and IADLs as well as positioning during the day and night to decrease irritation on cubital tunnel and volar wrist with decreased pain Baseline: Pain over the cubital tunnel and Guyon's canal 6-7/10.  With increased tenderness and pain increased numbness over her ulnar forearm wrist and hand into fifth and  ulnar side of fourth digit Goal status: Met  2.  Patient to be independent in home program to increase proprioception and ease  of right shoulder, elbow range of motion to normal reaching pattern  Baseline: Patient showed decreased coordination and proprioception in the reaching with the right upper extremity compensating with trunk and scapula Goal status: Met  3.  Right wrist and forearm strength increased to 4+ to initiate strengthening and carrying groceries less than 4 pounds within no increase symptoms Baseline: Active range of motion within functional limits for supination pronation  but decreased wrist extension/ flexion decreased strength in ulnar deviation Goal status: Progressing  4.  Digit active range of motion increased for patient to make composite fist and open hand to grasp around toothbrush and release, put hand in pocket Baseline: Three-quarter of a fist.  Decreased PIP extension with a lumbrical fist.  Decreased intrinsic first and second digit unable to touch any fingertips with opposition.  Numbness on ulnar side of forearm into the wrist hand fifth digit and ulnar fourth Goal status: Progressing  5.  Patient to be independent home program to decrease pain and tenderness from  elbow to hand to less than 2/10 Baseline: Pain 6-7/10 at cubital tunnel and volar wrist and Guyons canal- NOW  Goal status progressing  6.  Thumb active range of motion improved to within functional limits to be able to do palmar abduction to grasp around 4 cm object as well as increased opposition to pick up medication Baseline: Patient lacking 1 to 2 cm for opposition to second digit.  Unable to do opposition to any digits.  Decreased palmar abduction.  Radial abduction 45 degrees Goal status: Progressing    ASSESSMENT:  CLINICAL IMPRESSION: Patient patient followed by OT for increased weakness, increased pain and numbness in right dominant upper extremity since after surgery.  Patient had on 10/08/2023 a R VATS nerve sheath tumor excision -and hx of nephrectomy .  Patient present with increased pain and tenderness over right cubital tunnel as well as Guyon canal and carpal tunnel Patient with decreased coordination and proprioception and range of motion of right shoulder and elbow in ADLs and IADLs.  Patient with decreased range of motion and strength in her right wrist ,digits and thumb.  Most weakness stiffness in thumb palmar abduction, digit flexion extension as well as interosseous.  NOW  Since using MC flexion night splint patient with increased passive range of motion -patient brought in and MC flexion night splint was modified to increase flexion to 65 to 70 degrees as well as shorter and to only apply pressure at dorsal proximal phalanges.  Patient to continue to wear 6 to 8 hours at nighttime.  This date focused and facilitating of palmar abduction.  Patient continues to be difficulty with initiating palmar abduction as well as doing any resistance.  Patient does better when reaching between 2 cylinder objects facilitating some palmar abduction.  Fitted patient with a CMC prefab brace to position and support thumb CMC for patient to be able to use into more functional strengthening with  palmar abduction during the day.  Patient reported more ease with holding objects with thumb and opposition.  Patient showed more strength in digit flexion with Velcro board pulling cylindrical object and pushing using digit extension in between.  Patient report was able to fasten his zip on his jacket.  Do need some placing hold at times patient to continue with red flex bar for wrist and forearm  in  all  planes - with flexion glove - Needed min to mod assist for home exercises patient limited in functional use of right upper extremity in ADLs and IADLs.  Patient can benefit from skilled OT services to decrease pain and numbness increase motion and strength to return to prior level of function.  PERFORMANCE DEFICITS: in functional skills including ADLs, IADLs, coordination, dexterity, proprioception, sensation, edema, ROM, strength, pain, flexibility, Fine motor control, Gross motor control, decreased knowledge of precautions, and decreased knowledge of use of DME,  IMPAIRMENTS: are limiting patient from ADLs, IADLs, rest and sleep, work, play, leisure, and social participation.   COMORBIDITIES: may have co-morbidities  that affects occupational performance. Patient will benefit from skilled OT to address above impairments and improve overall function.  MODIFICATION OR ASSISTANCE TO COMPLETE EVALUATION: Min-Moderate modification of tasks or assist with assess necessary to complete an evaluation.  OT OCCUPATIONAL PROFILE AND HISTORY: Detailed assessment: Review of records and additional review of physical, cognitive, psychosocial history related to current functional performance.  CLINICAL DECISION MAKING: Moderate - several treatment options, min-mod task modification necessary  REHAB POTENTIAL: Good for goals  EVALUATION COMPLEXITY: Moderate      PLAN:  OT FREQUENCY: 1-2x/week  OT DURATION: 12 weeks  PLANNED INTERVENTIONS: 97168 OT Re-evaluation, 97535 self care/ADL training, 02889  therapeutic exercise, 97530 therapeutic activity, 97112 neuromuscular re-education, 97140 manual therapy, 97039 fluidotherapy, 97034 contrast bath, 97033 iontophoresis, passive range of motion, patient/family education, and DME and/or AE instructions    CONSULTED AND AGREED WITH PLAN OF CARE: Patient   Ancel Peters, OTR/L,CLT 04/16/2024, 9:26 PM

## 2024-04-23 ENCOUNTER — Encounter: Admitting: Occupational Therapy

## 2024-04-24 ENCOUNTER — Ambulatory Visit
Admission: EM | Admit: 2024-04-24 | Discharge: 2024-04-24 | Disposition: A | Discharge status: Home / Self Care | Attending: Emergency Medicine | Admitting: Emergency Medicine

## 2024-04-24 DIAGNOSIS — T300 Burn of unspecified body region, unspecified degree: Secondary | ICD-10-CM | POA: Diagnosis not present

## 2024-04-24 MED ORDER — CEPHALEXIN 500 MG PO CAPS: 500.0000 mg | ORAL_CAPSULE | Freq: Four times a day (QID) | ORAL | 0 refills | Status: AC

## 2024-04-24 MED ORDER — MUPIROCIN 2 % EX OINT
1.0000 | TOPICAL_OINTMENT | Freq: Two times a day (BID) | CUTANEOUS | 0 refills | Status: AC
Start: 2024-04-24 — End: 2024-05-01

## 2024-04-24 NOTE — Discharge Instructions (Addendum)
 [facility] (includes Burn)--- [phone]  Please call for follow up appt of right pinky Monday  Follow up with PCP for wound check Monday-call for appt Rest,elevate right hand Daily wound care with mupirocin ointment, telfa dressing. Take keflex as prescribed.  If you have increased redness, streaking, purulent drainage, unable to keep medication down, etc.,  go to ER for further evaluation.
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
## 2024-04-27 ENCOUNTER — Ambulatory Visit (INDEPENDENT_AMBULATORY_CARE_PROVIDER_SITE_OTHER): Admitting: Internal Medicine

## 2024-04-27 ENCOUNTER — Ambulatory Visit: Admitting: Occupational Therapy

## 2024-04-27 VITALS — BP 128/82 | HR 60 | Ht 69.0 in | Wt 199.0 lb

## 2024-04-27 DIAGNOSIS — C493 Malignant neoplasm of connective and soft tissue of thorax: Secondary | ICD-10-CM

## 2024-04-27 DIAGNOSIS — G540 Brachial plexus disorders: Secondary | ICD-10-CM

## 2024-04-27 DIAGNOSIS — T23329A Burn of third degree of unspecified single finger (nail) except thumb, initial encounter: Secondary | ICD-10-CM

## 2024-04-27 DIAGNOSIS — T300 Burn of unspecified body region, unspecified degree: Secondary | ICD-10-CM

## 2024-04-27 MED ORDER — ROSUVASTATIN CALCIUM 20 MG PO TABS: 20.0000 mg | ORAL_TABLET | Freq: Every day | ORAL | 1 refills | Status: DC

## 2024-04-27 MED ORDER — SILVER SULFADIAZINE 1 % EX CREA
1.0000 | TOPICAL_CREAM | Freq: Every day | CUTANEOUS | 0 refills | Status: AC
Start: 2024-04-27 — End: ?

## 2024-04-27 MED ORDER — LOSARTAN POTASSIUM 50 MG PO TABS: 50.0000 mg | ORAL_TABLET | Freq: Every day | ORAL | 1 refills | Status: AC

## 2024-04-27 NOTE — Patient Instructions (Signed)
 No need for [facility]  Keep covered,  TEST THE the silver sulfadiazine  on SOME OTHER PART OF YOUR BODY SINCE YOU MAY HAVE A REACTION TO IT
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
## 2024-04-28 NOTE — Assessment & Plan Note (Signed)
 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  No chemo per tumor board; continue surveillance imaging by [facility] every 3 months
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
## 2024-04-29 DIAGNOSIS — G540 Brachial plexus disorders: Secondary | ICD-10-CM | POA: Diagnosis not present

## 2024-04-30 ENCOUNTER — Ambulatory Visit: Admitting: Occupational Therapy

## 2024-04-30 DIAGNOSIS — M25631 Stiffness of right wrist, not elsewhere classified: Secondary | ICD-10-CM

## 2024-04-30 DIAGNOSIS — M79601 Pain in right arm: Secondary | ICD-10-CM

## 2024-04-30 DIAGNOSIS — M6281 Muscle weakness (generalized): Secondary | ICD-10-CM

## 2024-04-30 DIAGNOSIS — M25641 Stiffness of right hand, not elsewhere classified: Secondary | ICD-10-CM | POA: Diagnosis not present
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
## 2024-05-04 DIAGNOSIS — G629 Polyneuropathy, unspecified: Secondary | ICD-10-CM | POA: Diagnosis not present

## 2024-05-04 DIAGNOSIS — Z79899 Other long term (current) drug therapy: Secondary | ICD-10-CM | POA: Diagnosis not present

## 2024-05-04 DIAGNOSIS — S143XXS Injury of brachial plexus, sequela: Secondary | ICD-10-CM | POA: Diagnosis not present

## 2024-05-04 DIAGNOSIS — T23321D Burn of third degree of single right finger (nail) except thumb, subsequent encounter: Secondary | ICD-10-CM | POA: Diagnosis not present

## 2024-05-04 DIAGNOSIS — X58XXXS Exposure to other specified factors, sequela: Secondary | ICD-10-CM | POA: Diagnosis not present

## 2024-05-04 DIAGNOSIS — R911 Solitary pulmonary nodule: Secondary | ICD-10-CM | POA: Diagnosis not present

## 2024-05-04 DIAGNOSIS — X19XXXD Contact with other heat and hot substances, subsequent encounter: Secondary | ICD-10-CM | POA: Diagnosis not present

## 2024-05-04 DIAGNOSIS — G540 Brachial plexus disorders: Secondary | ICD-10-CM | POA: Diagnosis not present

## 2024-05-04 DIAGNOSIS — Z85820 Personal history of malignant melanoma of skin: Secondary | ICD-10-CM | POA: Diagnosis not present

## 2024-05-04 DIAGNOSIS — C493 Malignant neoplasm of connective and soft tissue of thorax: Secondary | ICD-10-CM | POA: Diagnosis not present

## 2024-05-04 DIAGNOSIS — R278 Other lack of coordination: Secondary | ICD-10-CM | POA: Diagnosis not present

## 2024-05-05 ENCOUNTER — Ambulatory Visit: Admitting: Occupational Therapy

## 2024-05-05 DIAGNOSIS — M25641 Stiffness of right hand, not elsewhere classified: Secondary | ICD-10-CM | POA: Diagnosis not present

## 2024-05-05 DIAGNOSIS — M6281 Muscle weakness (generalized): Secondary | ICD-10-CM | POA: Diagnosis not present

## 2024-05-05 DIAGNOSIS — M25631 Stiffness of right wrist, not elsewhere classified: Secondary | ICD-10-CM | POA: Diagnosis not present

## 2024-05-05 DIAGNOSIS — M79601 Pain in right arm: Secondary | ICD-10-CM
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
## 2024-05-07 ENCOUNTER — Ambulatory Visit: Admitting: Occupational Therapy

## 2024-05-11 ENCOUNTER — Ambulatory Visit: Admitting: Occupational Therapy

## 2024-05-11 DIAGNOSIS — M79601 Pain in right arm: Secondary | ICD-10-CM

## 2024-05-11 DIAGNOSIS — M25641 Stiffness of right hand, not elsewhere classified: Secondary | ICD-10-CM | POA: Diagnosis not present

## 2024-05-11 DIAGNOSIS — M25631 Stiffness of right wrist, not elsewhere classified: Secondary | ICD-10-CM | POA: Diagnosis not present

## 2024-05-11 DIAGNOSIS — M6281 Muscle weakness (generalized): Secondary | ICD-10-CM | POA: Diagnosis not present
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
## 2024-05-14 ENCOUNTER — Ambulatory Visit: Admitting: Occupational Therapy

## 2024-05-14 DIAGNOSIS — M6281 Muscle weakness (generalized): Secondary | ICD-10-CM | POA: Diagnosis not present

## 2024-05-14 DIAGNOSIS — M25641 Stiffness of right hand, not elsewhere classified: Secondary | ICD-10-CM | POA: Diagnosis not present

## 2024-05-14 DIAGNOSIS — M25631 Stiffness of right wrist, not elsewhere classified: Secondary | ICD-10-CM

## 2024-05-14 DIAGNOSIS — M79601 Pain in right arm: Secondary | ICD-10-CM
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
## 2024-05-25 ENCOUNTER — Ambulatory Visit: Attending: Thoracic Surgery (Cardiothoracic Vascular Surgery) | Admitting: Occupational Therapy

## 2024-05-25 DIAGNOSIS — M25511 Pain in right shoulder: Secondary | ICD-10-CM | POA: Insufficient documentation

## 2024-05-25 DIAGNOSIS — M25631 Stiffness of right wrist, not elsewhere classified: Secondary | ICD-10-CM | POA: Insufficient documentation

## 2024-05-25 DIAGNOSIS — M79601 Pain in right arm: Secondary | ICD-10-CM | POA: Insufficient documentation

## 2024-05-25 DIAGNOSIS — M6281 Muscle weakness (generalized): Secondary | ICD-10-CM | POA: Diagnosis not present

## 2024-05-25 DIAGNOSIS — M25512 Pain in left shoulder: Secondary | ICD-10-CM | POA: Diagnosis not present

## 2024-05-25 DIAGNOSIS — G8929 Other chronic pain: Secondary | ICD-10-CM | POA: Insufficient documentation

## 2024-05-25 DIAGNOSIS — M25641 Stiffness of right hand, not elsewhere classified: Secondary | ICD-10-CM | POA: Insufficient documentation
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
## 2024-05-26 ENCOUNTER — Other Ambulatory Visit

## 2024-05-26 DIAGNOSIS — R5383 Other fatigue: Secondary | ICD-10-CM

## 2024-05-26 DIAGNOSIS — I10 Essential (primary) hypertension: Secondary | ICD-10-CM

## 2024-05-26 DIAGNOSIS — E785 Hyperlipidemia, unspecified: Secondary | ICD-10-CM

## 2024-05-26 DIAGNOSIS — R7301 Impaired fasting glucose: Secondary | ICD-10-CM

## 2024-05-26 LAB — HEMOGLOBIN A1C: Hgb A1c MFr Bld: 5.5 % (ref 4.6–6.5)

## 2024-05-26 LAB — LIPID PANEL
Cholesterol: 180 mg/dL (ref 0–200)
HDL: 45.5 mg/dL (ref >39.00)
LDL Cholesterol: 99 mg/dL (ref 0–99)
NonHDL: 134.32
Total CHOL/HDL Ratio: 4
Triglycerides: 177 mg/dL — ABNORMAL HIGH (ref 0.0–149.0)
VLDL: 35.4 mg/dL (ref 0.0–40.0)

## 2024-05-26 LAB — TSH: TSH: 2.57 u[IU]/mL (ref 0.35–5.50)

## 2024-05-26 LAB — COMPREHENSIVE METABOLIC PANEL WITH GFR
ALT: 25 U/L (ref 0–53)
AST: 21 U/L (ref 0–37)
Albumin: 4.7 g/dL (ref 3.5–5.2)
Alkaline Phosphatase: 53 U/L (ref 39–117)
BUN: 14 mg/dL (ref 6–23)
CO2: 30 meq/L (ref 19–32)
Calcium: 9.7 mg/dL (ref 8.4–10.5)
Chloride: 99 meq/L (ref 96–112)
Creatinine, Ser: 1.22 mg/dL (ref 0.40–1.50)
GFR: 62.37 mL/min (ref >60.00)
Glucose, Bld: 88 mg/dL (ref 70–99)
Potassium: 4.4 meq/L (ref 3.5–5.1)
Sodium: 139 meq/L (ref 135–145)
Total Bilirubin: 0.8 mg/dL (ref 0.2–1.2)
Total Protein: 7.2 g/dL (ref 6.0–8.3)

## 2024-05-26 LAB — CBC WITH DIFFERENTIAL/PLATELET
Basophils Absolute: 0 K/uL (ref 0.0–0.1)
Basophils Relative: 1.1 % (ref 0.0–3.0)
Eosinophils Absolute: 0.2 K/uL (ref 0.0–0.7)
Eosinophils Relative: 4.4 % (ref 0.0–5.0)
HCT: 48.1 % (ref 39.0–52.0)
Hemoglobin: 16.3 g/dL (ref 13.0–17.0)
Lymphocytes Relative: 24.6 % (ref 12.0–46.0)
Lymphs Abs: 0.9 K/uL (ref 0.7–4.0)
MCHC: 34 g/dL (ref 30.0–36.0)
MCV: 90 fl (ref 78.0–100.0)
Monocytes Absolute: 0.5 K/uL (ref 0.1–1.0)
Monocytes Relative: 12.6 % — ABNORMAL HIGH (ref 3.0–12.0)
Neutro Abs: 2.2 K/uL (ref 1.4–7.7)
Neutrophils Relative %: 57.3 % (ref 43.0–77.0)
Platelets: 135 K/uL — ABNORMAL LOW (ref 150.0–400.0)
RBC: 5.35 Mil/uL (ref 4.22–5.81)
RDW: 13.3 % (ref 11.5–15.5)
WBC: 3.8 K/uL — ABNORMAL LOW (ref 4.0–10.5)

## 2024-05-26 LAB — MICROALBUMIN / CREATININE URINE RATIO
Creatinine,U: 39.5 mg/dL
Microalb Creat Ratio: UNDETERMINED mg/g (ref 0.0–30.0)
Microalb, Ur: <0.7 mg/dL

## 2024-05-26 LAB — LDL CHOLESTEROL, DIRECT: Direct LDL: 104 mg/dL

## 2024-05-27 ENCOUNTER — Ambulatory Visit: Payer: Self-pay | Admitting: Internal Medicine

## 2024-05-28 ENCOUNTER — Encounter: Payer: Self-pay | Admitting: Internal Medicine

## 2024-05-28 ENCOUNTER — Ambulatory Visit: Admitting: Internal Medicine

## 2024-05-28 VITALS — BP 108/76 | HR 68 | Ht 69.0 in | Wt 191.6 lb

## 2024-05-28 DIAGNOSIS — N1831 Chronic kidney disease, stage 3a: Secondary | ICD-10-CM | POA: Diagnosis not present

## 2024-05-28 DIAGNOSIS — Z23 Encounter for immunization: Secondary | ICD-10-CM | POA: Diagnosis not present

## 2024-05-28 DIAGNOSIS — G540 Brachial plexus disorders: Secondary | ICD-10-CM

## 2024-05-28 DIAGNOSIS — Z0000 Encounter for general adult medical examination without abnormal findings: Secondary | ICD-10-CM

## 2024-05-28 DIAGNOSIS — T23021A Burn of unspecified degree of single right finger (nail) except thumb, initial encounter: Secondary | ICD-10-CM | POA: Diagnosis not present

## 2024-05-28 DIAGNOSIS — Z125 Encounter for screening for malignant neoplasm of prostate: Secondary | ICD-10-CM

## 2024-05-28 DIAGNOSIS — R972 Elevated prostate specific antigen [PSA]: Secondary | ICD-10-CM

## 2024-05-28 DIAGNOSIS — Z0001 Encounter for general adult medical examination with abnormal findings: Secondary | ICD-10-CM

## 2024-05-28 DIAGNOSIS — E785 Hyperlipidemia, unspecified: Secondary | ICD-10-CM | POA: Diagnosis not present

## 2024-05-28 DIAGNOSIS — T300 Burn of unspecified body region, unspecified degree: Secondary | ICD-10-CM

## 2024-05-28 DIAGNOSIS — C493 Malignant neoplasm of connective and soft tissue of thorax: Secondary | ICD-10-CM

## 2024-05-28 LAB — PSA: PSA: 0.36 ng/mL (ref 0.10–4.00)

## 2024-05-28 MED ORDER — TADALAFIL 5 MG PO TABS: 5.0000 mg | ORAL_TABLET | Freq: Every day | ORAL | 3 refills | Status: AC

## 2024-05-28 MED ORDER — ROSUVASTATIN CALCIUM 20 MG PO TABS: 30.0000 mg | ORAL_TABLET | Freq: Every day | ORAL | 1 refills | Status: AC

## 2024-05-28 NOTE — Assessment & Plan Note (Signed)
 Given atherosclerosis noted on 2017 CT scan of chest  and CAC score > 300.   Increase crestor  to 30 mg daily  Lab Results  Component Value Date   CHOL 180 05/26/2024   HDL 45.50 05/26/2024   LDLCALC 99 05/26/2024   LDLDIRECT 104.0 05/26/2024   TRIG 177.0 (H) 05/26/2024   CHOLHDL 4 05/26/2024

## 2024-05-28 NOTE — Progress Notes (Unsigned)
 Patient ID: Daniel Lawrence, male    DOB: 11/10/1960  Age: 63 y.o. MRN: [id number]  The patient is here for annual preventive examination and management of other chronic and acute problems.   The risk factors are reflected in the social history.   The roster of all physicians providing medical care to patient - is listed in the Snapshot section of the chart.   Activities of daily living:  The patient is 100% independent in all ADLs: dressing, toileting, feeding as well as independent mobility   Home safety : The patient has smoke detectors in the home. They wear seatbelts.  There are no unsecured firearms at home. There is no violence in the home.    There is no risks for hepatitis, STDs or HIV. There is no   history of blood transfusion. They have no travel history to infectious disease endemic areas of the world.   The patient has seen their dentist in the last six month. They have seen their eye doctor in the last year. The patinet  denies slight hearing difficulty with regard to whispered voices and some television programs.  They have deferred audiologic testing in the last year.  They do not  have excessive sun exposure. Discussed the need for sun protection: hats, long sleeves and use of sunscreen if there is significant sun exposure.    Diet: the importance of a healthy diet is discussed. They do have a healthy diet.   The benefits of regular aerobic exercise were discussed. The patient  exercises  3 to 5 days per week  for  60 minutes.    Depression screen: there are no signs or vegative symptoms of depression- irritability, change in appetite, anhedonia, sadness/tearfullness.   The following portions of the patient's history were reviewed and updated as appropriate: allergies, current medications, past family history, past medical history,  past surgical history, past social history  and problem list.   Visual acuity was not assessed per patient preference since the patient has  regular follow up with an  ophthalmologist. Hearing and body mass index were assessed and reviewed.    During the course of the visit the patient was educated and counseled about appropriate screening and preventive services including : fall prevention , diabetes screening, nutrition counseling, colorectal cancer screening, and recommended immunizations.    Chief Complaint:      Review of Symptoms  Patient denies headache, fevers, malaise, unintentional weight loss, skin rash, eye pain, sinus congestion and sinus pain, sore throat, dysphagia,  hemoptysis , cough, dyspnea, wheezing, chest pain, palpitations, orthopnea, edema, abdominal pain, nausea, melena, diarrhea, constipation, flank pain, dysuria, hematuria, urinary  Frequency, nocturia, numbness, tingling, seizures,  Focal weakness, Loss of consciousness,  Tremor, insomnia, depression, anxiety, and suicidal ideation.    Physical Exam:  BP 108/76   Pulse 68   Ht 5' 9 (1.753 m)   Wt 191 lb 9.6 oz (86.9 kg)   SpO2 97%   BMI 28.29 kg/m    Physical Exam Vitals reviewed.  Constitutional:      General: He is not in acute distress.    Appearance: Normal appearance. He is normal weight. He is not ill-appearing, toxic-appearing or diaphoretic.  HENT:     Head: Normocephalic and atraumatic.     Right Ear: Tympanic membrane, ear canal and external ear normal. There is no impacted cerumen.     Left Ear: Tympanic membrane, ear canal and external ear normal. There is no impacted cerumen.  Nose: Nose normal.     Mouth/Throat:     Mouth: Mucous membranes are moist.     Pharynx: Oropharynx is clear.  Eyes:     General: No scleral icterus.       Right eye: No discharge.        Left eye: No discharge.     Conjunctiva/sclera: Conjunctivae normal.  Neck:     Thyroid : No thyromegaly.     Vascular: No carotid bruit or JVD.  Cardiovascular:     Rate and Rhythm: Normal rate and regular rhythm.     Heart sounds: Normal heart sounds.   Pulmonary:     Effort: Pulmonary effort is normal. No respiratory distress.     Breath sounds: Normal breath sounds.  Abdominal:     General: Bowel sounds are normal.     Palpations: Abdomen is soft. There is no mass.     Tenderness: There is no abdominal tenderness. There is no guarding or rebound.  Musculoskeletal:        General: Normal range of motion.     Cervical back: Normal range of motion and neck supple.  Lymphadenopathy:     Cervical: No cervical adenopathy.  Skin:    General: Skin is warm and dry.  Neurological:     General: No focal deficit present.     Mental Status: He is alert and oriented to person, place, and time. Mental status is at baseline.  Psychiatric:        Mood and Affect: Mood normal.        Behavior: Behavior normal.        Thought Content: Thought content normal.        Judgment: Judgment normal.    Assessment and Plan: Hyperlipidemia with target LDL less than 100 Assessment & Plan: Given atherosclerosis noted on 2017 CT scan of chest   Goal is 70 on LDL.  Recent labs were done at work  And were above goal on 15 mg crestor  but he requested a  repeat after a few weeks of intense dietary changes..   Lab Results  Component Value Date   CHOL 180 05/26/2024   HDL 45.50 05/26/2024   LDLCALC 99 05/26/2024   LDLDIRECT 104.0 05/26/2024   TRIG 177.0 (H) 05/26/2024   CHOLHDL 4 05/26/2024       Prostate cancer screening -     PSA  Other orders -     Tadalafil ; Take 1 tablet (5 mg total) by mouth daily.  Dispense: 90 tablet; Refill: 3 -     Rosuvastatin  Calcium ; Take 1.5 tablets (30 mg total) by mouth daily.  Dispense: 90 tablet; Refill: 1    No follow-ups on file.  Verneita LITTIE Kettering, MD

## 2024-05-28 NOTE — Patient Instructions (Signed)
 INCREASE CRESTOR  DOSE TO 30 MG FOR GOAL LDL 70  CONTINUE LOSARTAN    PSA TO BE DRAWN TODAY  YOU RECEIVED INFLUENZA AND PNEUMOCOCCAL PNEUMONIA VACCINES TODAY

## 2024-05-29 ENCOUNTER — Ambulatory Visit: Admitting: Occupational Therapy

## 2024-05-29 ENCOUNTER — Ambulatory Visit: Payer: Self-pay | Admitting: Internal Medicine

## 2024-05-29 DIAGNOSIS — M79601 Pain in right arm: Secondary | ICD-10-CM

## 2024-05-29 DIAGNOSIS — M6281 Muscle weakness (generalized): Secondary | ICD-10-CM

## 2024-05-29 DIAGNOSIS — G8929 Other chronic pain: Secondary | ICD-10-CM | POA: Diagnosis not present

## 2024-05-29 DIAGNOSIS — M25631 Stiffness of right wrist, not elsewhere classified: Secondary | ICD-10-CM

## 2024-05-29 DIAGNOSIS — M25512 Pain in left shoulder: Secondary | ICD-10-CM | POA: Diagnosis not present

## 2024-05-29 DIAGNOSIS — M25641 Stiffness of right hand, not elsewhere classified: Secondary | ICD-10-CM | POA: Diagnosis not present

## 2024-05-29 DIAGNOSIS — M25511 Pain in right shoulder: Secondary | ICD-10-CM | POA: Diagnosis not present

## 2024-05-29 NOTE — Assessment & Plan Note (Signed)
 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  Symptoms are now limited to right hand and he is working with OT/PT to improve strength and Flexion.  He is seeking additional surgery to relieve pressure on medial nerve

## 2024-05-29 NOTE — Assessment & Plan Note (Signed)
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
## 2024-05-29 NOTE — Assessment & Plan Note (Signed)
 Resected from right chest wall in March 2025 via VATS excision. With positive margins. Noted on path report. Next MRI of chest wall is scheduled for December

## 2024-05-29 NOTE — Assessment & Plan Note (Signed)
 Improved by current labs   Avoiding NSAIDs.  Continue losartan   Lab Results  Component Value Date   NA 139 05/26/2024   K 4.4 05/26/2024   CL 99 05/26/2024   CO2 30 05/26/2024   Lab Results  Component Value Date   CREATININE 1.22 05/26/2024   Lab Results  Component Value Date   MICROALBUR <0.7 05/26/2024
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
## 2024-05-29 NOTE — Assessment & Plan Note (Signed)
 PSA doubled but rose < 0.75 in December 2023. But has been declining on repeat testing.  Lab Results  Component Value Date   PSA 0.36 05/28/2024   PSA 0.54 05/29/2023   PSA 0.88 07/02/2022

## 2024-06-01 ENCOUNTER — Ambulatory Visit: Admitting: Occupational Therapy

## 2024-06-01 DIAGNOSIS — M25631 Stiffness of right wrist, not elsewhere classified: Secondary | ICD-10-CM

## 2024-06-01 DIAGNOSIS — M25641 Stiffness of right hand, not elsewhere classified: Secondary | ICD-10-CM | POA: Diagnosis not present

## 2024-06-01 DIAGNOSIS — M25512 Pain in left shoulder: Secondary | ICD-10-CM | POA: Diagnosis not present

## 2024-06-01 DIAGNOSIS — M79601 Pain in right arm: Secondary | ICD-10-CM | POA: Diagnosis not present

## 2024-06-01 DIAGNOSIS — G8929 Other chronic pain: Secondary | ICD-10-CM

## 2024-06-01 DIAGNOSIS — M6281 Muscle weakness (generalized): Secondary | ICD-10-CM | POA: Diagnosis not present

## 2024-06-01 DIAGNOSIS — M25511 Pain in right shoulder: Secondary | ICD-10-CM | POA: Diagnosis not present
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
## 2024-06-04 ENCOUNTER — Ambulatory Visit: Admitting: Occupational Therapy

## 2024-06-04 DIAGNOSIS — M25512 Pain in left shoulder: Secondary | ICD-10-CM | POA: Diagnosis not present

## 2024-06-04 DIAGNOSIS — M6281 Muscle weakness (generalized): Secondary | ICD-10-CM

## 2024-06-04 DIAGNOSIS — G8929 Other chronic pain: Secondary | ICD-10-CM | POA: Diagnosis not present

## 2024-06-04 DIAGNOSIS — M25511 Pain in right shoulder: Secondary | ICD-10-CM | POA: Diagnosis not present

## 2024-06-04 DIAGNOSIS — M25641 Stiffness of right hand, not elsewhere classified: Secondary | ICD-10-CM

## 2024-06-04 DIAGNOSIS — M25631 Stiffness of right wrist, not elsewhere classified: Secondary | ICD-10-CM | POA: Diagnosis not present

## 2024-06-04 DIAGNOSIS — M79601 Pain in right arm: Secondary | ICD-10-CM | POA: Diagnosis not present
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
## 2024-06-08 ENCOUNTER — Ambulatory Visit: Admitting: Occupational Therapy

## 2024-06-08 DIAGNOSIS — M25511 Pain in right shoulder: Secondary | ICD-10-CM | POA: Diagnosis not present

## 2024-06-08 DIAGNOSIS — M6281 Muscle weakness (generalized): Secondary | ICD-10-CM | POA: Diagnosis not present

## 2024-06-08 DIAGNOSIS — M79601 Pain in right arm: Secondary | ICD-10-CM | POA: Diagnosis not present

## 2024-06-08 DIAGNOSIS — G8929 Other chronic pain: Secondary | ICD-10-CM | POA: Diagnosis not present

## 2024-06-08 DIAGNOSIS — M25641 Stiffness of right hand, not elsewhere classified: Secondary | ICD-10-CM

## 2024-06-08 DIAGNOSIS — M25631 Stiffness of right wrist, not elsewhere classified: Secondary | ICD-10-CM | POA: Diagnosis not present

## 2024-06-08 DIAGNOSIS — M25512 Pain in left shoulder: Secondary | ICD-10-CM | POA: Diagnosis not present
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
## 2024-06-15 ENCOUNTER — Ambulatory Visit: Attending: Family Medicine | Admitting: Occupational Therapy

## 2024-06-15 DIAGNOSIS — M79601 Pain in right arm: Secondary | ICD-10-CM | POA: Insufficient documentation

## 2024-06-15 DIAGNOSIS — M25641 Stiffness of right hand, not elsewhere classified: Secondary | ICD-10-CM | POA: Diagnosis present

## 2024-06-15 DIAGNOSIS — M6281 Muscle weakness (generalized): Secondary | ICD-10-CM | POA: Insufficient documentation

## 2024-06-15 DIAGNOSIS — M25631 Stiffness of right wrist, not elsewhere classified: Secondary | ICD-10-CM | POA: Diagnosis present
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
## 2024-06-18 ENCOUNTER — Ambulatory Visit: Admitting: Occupational Therapy

## 2024-06-18 DIAGNOSIS — M25631 Stiffness of right wrist, not elsewhere classified: Secondary | ICD-10-CM

## 2024-06-18 DIAGNOSIS — M79601 Pain in right arm: Secondary | ICD-10-CM

## 2024-06-18 DIAGNOSIS — M25641 Stiffness of right hand, not elsewhere classified: Secondary | ICD-10-CM

## 2024-06-18 DIAGNOSIS — M6281 Muscle weakness (generalized): Secondary | ICD-10-CM
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
## 2024-06-22 ENCOUNTER — Ambulatory Visit: Admitting: Occupational Therapy

## 2024-06-22 DIAGNOSIS — M25641 Stiffness of right hand, not elsewhere classified: Secondary | ICD-10-CM

## 2024-06-22 DIAGNOSIS — M6281 Muscle weakness (generalized): Secondary | ICD-10-CM

## 2024-06-22 DIAGNOSIS — M79601 Pain in right arm: Secondary | ICD-10-CM

## 2024-06-22 DIAGNOSIS — M25631 Stiffness of right wrist, not elsewhere classified: Secondary | ICD-10-CM | POA: Diagnosis not present
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
## 2024-06-25 ENCOUNTER — Ambulatory Visit: Admitting: Occupational Therapy

## 2024-06-25 DIAGNOSIS — M79601 Pain in right arm: Secondary | ICD-10-CM

## 2024-06-25 DIAGNOSIS — M25641 Stiffness of right hand, not elsewhere classified: Secondary | ICD-10-CM

## 2024-06-25 DIAGNOSIS — R222 Localized swelling, mass and lump, trunk: Secondary | ICD-10-CM | POA: Diagnosis not present

## 2024-06-25 DIAGNOSIS — M25631 Stiffness of right wrist, not elsewhere classified: Secondary | ICD-10-CM | POA: Diagnosis not present

## 2024-06-25 DIAGNOSIS — M6281 Muscle weakness (generalized): Secondary | ICD-10-CM

## 2024-06-25 DIAGNOSIS — C493 Malignant neoplasm of connective and soft tissue of thorax: Secondary | ICD-10-CM | POA: Diagnosis not present
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
## 2024-06-30 DIAGNOSIS — G540 Brachial plexus disorders: Secondary | ICD-10-CM | POA: Diagnosis not present

## 2024-06-30 DIAGNOSIS — G5601 Carpal tunnel syndrome, right upper limb: Secondary | ICD-10-CM | POA: Diagnosis not present

## 2024-06-30 DIAGNOSIS — G5621 Lesion of ulnar nerve, right upper limb: Secondary | ICD-10-CM | POA: Diagnosis not present

## 2024-07-02 ENCOUNTER — Ambulatory Visit: Admitting: Occupational Therapy

## 2024-07-21 ENCOUNTER — Ambulatory Visit: Attending: Thoracic Surgery (Cardiothoracic Vascular Surgery) | Admitting: Occupational Therapy

## 2024-07-21 ENCOUNTER — Encounter: Payer: Self-pay | Admitting: Occupational Therapy

## 2024-07-21 DIAGNOSIS — M25631 Stiffness of right wrist, not elsewhere classified: Secondary | ICD-10-CM | POA: Insufficient documentation

## 2024-07-21 DIAGNOSIS — R200 Anesthesia of skin: Secondary | ICD-10-CM | POA: Insufficient documentation

## 2024-07-21 DIAGNOSIS — M25641 Stiffness of right hand, not elsewhere classified: Secondary | ICD-10-CM | POA: Insufficient documentation

## 2024-07-21 DIAGNOSIS — M6281 Muscle weakness (generalized): Secondary | ICD-10-CM | POA: Insufficient documentation
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
## 2024-07-27 ENCOUNTER — Ambulatory Visit: Admitting: Occupational Therapy

## 2024-07-27 DIAGNOSIS — R200 Anesthesia of skin: Secondary | ICD-10-CM

## 2024-07-27 DIAGNOSIS — M25631 Stiffness of right wrist, not elsewhere classified: Secondary | ICD-10-CM

## 2024-07-27 DIAGNOSIS — M25641 Stiffness of right hand, not elsewhere classified: Secondary | ICD-10-CM

## 2024-07-27 DIAGNOSIS — M6281 Muscle weakness (generalized): Secondary | ICD-10-CM
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
## 2024-08-04 ENCOUNTER — Ambulatory Visit: Admitting: Occupational Therapy

## 2024-08-04 DIAGNOSIS — M25641 Stiffness of right hand, not elsewhere classified: Secondary | ICD-10-CM

## 2024-08-04 DIAGNOSIS — M6281 Muscle weakness (generalized): Secondary | ICD-10-CM

## 2024-08-04 DIAGNOSIS — R200 Anesthesia of skin: Secondary | ICD-10-CM
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
## 2024-08-10 ENCOUNTER — Ambulatory Visit: Admitting: Occupational Therapy

## 2024-08-13 ENCOUNTER — Ambulatory Visit: Admitting: Occupational Therapy

## 2024-08-13 DIAGNOSIS — M25631 Stiffness of right wrist, not elsewhere classified: Secondary | ICD-10-CM

## 2024-08-13 DIAGNOSIS — R200 Anesthesia of skin: Secondary | ICD-10-CM

## 2024-08-13 DIAGNOSIS — M6281 Muscle weakness (generalized): Secondary | ICD-10-CM

## 2024-08-13 DIAGNOSIS — M25641 Stiffness of right hand, not elsewhere classified: Secondary | ICD-10-CM
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
## 2024-08-20 ENCOUNTER — Ambulatory Visit: Attending: Family Medicine | Admitting: Occupational Therapy

## 2024-08-20 DIAGNOSIS — M25641 Stiffness of right hand, not elsewhere classified: Secondary | ICD-10-CM

## 2024-08-20 DIAGNOSIS — M25631 Stiffness of right wrist, not elsewhere classified: Secondary | ICD-10-CM

## 2024-08-20 DIAGNOSIS — M6281 Muscle weakness (generalized): Secondary | ICD-10-CM

## 2024-08-20 DIAGNOSIS — R200 Anesthesia of skin: Secondary | ICD-10-CM
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
## 2024-08-21 ENCOUNTER — Ambulatory Visit: Admitting: Occupational Therapy

## 2024-08-28 ENCOUNTER — Ambulatory Visit: Admitting: Occupational Therapy

## 2024-11-25 ENCOUNTER — Other Ambulatory Visit

## 2024-11-27 ENCOUNTER — Ambulatory Visit: Admitting: Internal Medicine
